# Patient Record
Sex: Female | Born: 1950
Health system: Southern US, Community
[De-identification: ages and names within clinical notes are randomized; demographics above are authoritative.]

## PROBLEM LIST (undated history)

## (undated) DIAGNOSIS — K219 Gastro-esophageal reflux disease without esophagitis: Secondary | ICD-10-CM

## (undated) DIAGNOSIS — T43224A Poisoning by selective serotonin reuptake inhibitors, undetermined, initial encounter: Secondary | ICD-10-CM

## (undated) DIAGNOSIS — R000 Tachycardia, unspecified: Secondary | ICD-10-CM

## (undated) DIAGNOSIS — F329 Major depressive disorder, single episode, unspecified: Secondary | ICD-10-CM

## (undated) DIAGNOSIS — E049 Nontoxic goiter, unspecified: Secondary | ICD-10-CM

## (undated) DIAGNOSIS — Z8679 Personal history of other diseases of the circulatory system: Secondary | ICD-10-CM

## (undated) DIAGNOSIS — J449 Chronic obstructive pulmonary disease, unspecified: Secondary | ICD-10-CM

## (undated) DIAGNOSIS — M1990 Unspecified osteoarthritis, unspecified site: Secondary | ICD-10-CM

## (undated) DIAGNOSIS — R002 Palpitations: Secondary | ICD-10-CM

## (undated) DIAGNOSIS — I4891 Unspecified atrial fibrillation: Secondary | ICD-10-CM

## (undated) DIAGNOSIS — M19049 Primary osteoarthritis, unspecified hand: Secondary | ICD-10-CM

## (undated) DIAGNOSIS — F32A Depression, unspecified: Secondary | ICD-10-CM

## (undated) DIAGNOSIS — C801 Malignant (primary) neoplasm, unspecified: Secondary | ICD-10-CM

## (undated) DIAGNOSIS — F410 Panic disorder [episodic paroxysmal anxiety] without agoraphobia: Secondary | ICD-10-CM

## (undated) DIAGNOSIS — F419 Anxiety disorder, unspecified: Secondary | ICD-10-CM

## (undated) HISTORY — DX: Poisoning by selective serotonin reuptake inhibitors, undetermined, initial encounter: T43.224A

## (undated) HISTORY — PX: COLONOSCOPY: SHX174

## (undated) HISTORY — DX: Personal history of other diseases of the circulatory system: Z86.79

## (undated) HISTORY — DX: Major depressive disorder, single episode, unspecified: F32.9

## (undated) HISTORY — DX: Nontoxic goiter, unspecified: E04.9

## (undated) HISTORY — DX: Unspecified atrial fibrillation: I48.91

## (undated) HISTORY — PX: APPENDECTOMY: SHX54

## (undated) HISTORY — DX: Palpitations: R00.2

## (undated) HISTORY — PX: OTHER SURGICAL HISTORY: SHX169

## (undated) HISTORY — DX: Tachycardia, unspecified: R00.0

## (undated) HISTORY — DX: Primary osteoarthritis, unspecified hand: M19.049

## (undated) HISTORY — DX: Panic disorder (episodic paroxysmal anxiety): F41.0

## (undated) HISTORY — PX: THYROIDECTOMY, PARTIAL: SHX18

## (undated) HISTORY — DX: Anxiety disorder, unspecified: F41.9

## (undated) HISTORY — DX: Unspecified osteoarthritis, unspecified site: M19.90

## (undated) HISTORY — DX: Depression, unspecified: F32.A

---
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
## 1997-12-10 ENCOUNTER — Emergency Department (HOSPITAL_COMMUNITY): Admission: EM | Admit: 1997-12-10 | Discharge: 1997-12-10 | Payer: Self-pay | Admitting: Emergency Medicine

## 1998-03-12 ENCOUNTER — Other Ambulatory Visit: Admission: RE | Admit: 1998-03-12 | Discharge: 1998-03-12 | Payer: Self-pay | Admitting: Gynecology

## 1998-05-24 ENCOUNTER — Ambulatory Visit (HOSPITAL_COMMUNITY): Admission: RE | Admit: 1998-05-24 | Discharge: 1998-05-24 | Payer: Self-pay | Admitting: Pulmonary Disease

## 1998-05-24 ENCOUNTER — Encounter: Payer: Self-pay | Admitting: Pulmonary Disease

## 1998-12-10 ENCOUNTER — Other Ambulatory Visit: Admission: RE | Admit: 1998-12-10 | Discharge: 1998-12-10 | Payer: Self-pay | Admitting: Obstetrics and Gynecology

## 1998-12-17 ENCOUNTER — Encounter: Payer: Self-pay | Admitting: Emergency Medicine

## 1998-12-17 ENCOUNTER — Emergency Department (HOSPITAL_COMMUNITY): Admission: EM | Admit: 1998-12-17 | Discharge: 1998-12-17 | Payer: Self-pay | Admitting: Emergency Medicine

## 1999-03-03 ENCOUNTER — Emergency Department (HOSPITAL_COMMUNITY): Admission: EM | Admit: 1999-03-03 | Discharge: 1999-03-03 | Payer: Self-pay | Admitting: Emergency Medicine

## 1999-04-21 HISTORY — PX: OTHER SURGICAL HISTORY: SHX169

## 1999-05-02 ENCOUNTER — Encounter: Admission: RE | Admit: 1999-05-02 | Discharge: 1999-05-02 | Payer: Self-pay | Admitting: Otolaryngology

## 1999-05-12 ENCOUNTER — Emergency Department (HOSPITAL_COMMUNITY): Admission: EM | Admit: 1999-05-12 | Discharge: 1999-05-12 | Payer: Self-pay | Admitting: Emergency Medicine

## 1999-05-12 ENCOUNTER — Encounter: Payer: Self-pay | Admitting: Emergency Medicine

## 1999-05-15 ENCOUNTER — Emergency Department (HOSPITAL_COMMUNITY): Admission: EM | Admit: 1999-05-15 | Discharge: 1999-05-15 | Payer: Self-pay | Admitting: Emergency Medicine

## 1999-05-15 ENCOUNTER — Encounter: Payer: Self-pay | Admitting: Emergency Medicine

## 1999-05-17 ENCOUNTER — Ambulatory Visit (HOSPITAL_BASED_OUTPATIENT_CLINIC_OR_DEPARTMENT_OTHER): Admission: RE | Admit: 1999-05-17 | Discharge: 1999-05-17 | Payer: Self-pay | Admitting: Otolaryngology

## 2000-06-15 ENCOUNTER — Other Ambulatory Visit: Admission: RE | Admit: 2000-06-15 | Discharge: 2000-06-15 | Payer: Self-pay | Admitting: Internal Medicine

## 2000-08-21 ENCOUNTER — Ambulatory Visit (HOSPITAL_COMMUNITY): Admission: RE | Admit: 2000-08-21 | Discharge: 2000-08-21 | Payer: Self-pay | Admitting: Internal Medicine

## 2001-05-06 ENCOUNTER — Encounter: Payer: Self-pay | Admitting: *Deleted

## 2001-05-06 ENCOUNTER — Emergency Department (HOSPITAL_COMMUNITY): Admission: EM | Admit: 2001-05-06 | Discharge: 2001-05-06 | Payer: Self-pay | Admitting: Emergency Medicine

## 2002-12-13 ENCOUNTER — Encounter: Payer: Self-pay | Admitting: *Deleted

## 2002-12-13 ENCOUNTER — Encounter: Payer: Self-pay | Admitting: Emergency Medicine

## 2002-12-13 ENCOUNTER — Emergency Department (HOSPITAL_COMMUNITY): Admission: EM | Admit: 2002-12-13 | Discharge: 2002-12-13 | Payer: Self-pay | Admitting: Emergency Medicine

## 2003-01-26 ENCOUNTER — Encounter: Admission: RE | Admit: 2003-01-26 | Discharge: 2003-01-26 | Payer: Self-pay | Admitting: Internal Medicine

## 2003-03-09 ENCOUNTER — Encounter: Admission: RE | Admit: 2003-03-09 | Discharge: 2003-03-09 | Payer: Self-pay | Admitting: Internal Medicine

## 2003-03-20 ENCOUNTER — Encounter: Admission: RE | Admit: 2003-03-20 | Discharge: 2003-03-20 | Payer: Self-pay | Admitting: Internal Medicine

## 2003-05-05 ENCOUNTER — Ambulatory Visit (HOSPITAL_COMMUNITY): Admission: RE | Admit: 2003-05-05 | Discharge: 2003-05-05 | Payer: Self-pay | Admitting: Internal Medicine

## 2003-05-05 ENCOUNTER — Encounter: Payer: Self-pay | Admitting: Internal Medicine

## 2003-06-26 ENCOUNTER — Encounter: Admission: RE | Admit: 2003-06-26 | Discharge: 2003-06-26 | Payer: Self-pay | Admitting: Internal Medicine

## 2003-07-06 ENCOUNTER — Encounter: Admission: RE | Admit: 2003-07-06 | Discharge: 2003-07-06 | Payer: Self-pay | Admitting: Internal Medicine

## 2003-08-04 ENCOUNTER — Encounter: Admission: RE | Admit: 2003-08-04 | Discharge: 2003-09-19 | Payer: Self-pay | Admitting: *Deleted

## 2003-09-27 ENCOUNTER — Encounter: Admission: RE | Admit: 2003-09-27 | Discharge: 2003-10-10 | Payer: Self-pay | Admitting: *Deleted

## 2003-10-25 ENCOUNTER — Encounter: Admission: RE | Admit: 2003-10-25 | Discharge: 2003-10-25 | Payer: Self-pay | Admitting: Internal Medicine

## 2003-12-08 ENCOUNTER — Encounter: Admission: RE | Admit: 2003-12-08 | Discharge: 2003-12-08 | Payer: Self-pay | Admitting: Internal Medicine

## 2003-12-08 ENCOUNTER — Ambulatory Visit (HOSPITAL_COMMUNITY): Admission: RE | Admit: 2003-12-08 | Discharge: 2003-12-08 | Payer: Self-pay | Admitting: Internal Medicine

## 2004-01-04 ENCOUNTER — Ambulatory Visit (HOSPITAL_COMMUNITY): Admission: RE | Admit: 2004-01-04 | Discharge: 2004-01-04 | Payer: Self-pay | Admitting: Internal Medicine

## 2004-01-04 ENCOUNTER — Encounter: Admission: RE | Admit: 2004-01-04 | Discharge: 2004-01-04 | Payer: Self-pay | Admitting: Internal Medicine

## 2004-04-16 ENCOUNTER — Emergency Department (HOSPITAL_COMMUNITY): Admission: EM | Admit: 2004-04-16 | Discharge: 2004-04-16 | Payer: Self-pay | Admitting: Family Medicine

## 2004-04-27 ENCOUNTER — Emergency Department (HOSPITAL_COMMUNITY): Admission: EM | Admit: 2004-04-27 | Discharge: 2004-04-27 | Payer: Self-pay | Admitting: Emergency Medicine

## 2004-05-06 ENCOUNTER — Ambulatory Visit: Payer: Self-pay | Admitting: Internal Medicine

## 2004-05-07 ENCOUNTER — Ambulatory Visit (HOSPITAL_COMMUNITY): Admission: RE | Admit: 2004-05-07 | Discharge: 2004-05-07 | Payer: Self-pay | Admitting: Internal Medicine

## 2004-05-13 ENCOUNTER — Ambulatory Visit: Payer: Self-pay | Admitting: Internal Medicine

## 2004-05-30 ENCOUNTER — Ambulatory Visit: Payer: Self-pay | Admitting: Internal Medicine

## 2004-08-03 ENCOUNTER — Emergency Department (HOSPITAL_COMMUNITY): Admission: EM | Admit: 2004-08-03 | Discharge: 2004-08-03 | Payer: Self-pay | Admitting: Family Medicine

## 2004-09-11 ENCOUNTER — Ambulatory Visit: Payer: Self-pay | Admitting: Internal Medicine

## 2004-10-10 ENCOUNTER — Emergency Department (HOSPITAL_COMMUNITY): Admission: EM | Admit: 2004-10-10 | Discharge: 2004-10-10 | Payer: Self-pay | Admitting: Family Medicine

## 2004-10-16 ENCOUNTER — Ambulatory Visit (HOSPITAL_BASED_OUTPATIENT_CLINIC_OR_DEPARTMENT_OTHER): Admission: RE | Admit: 2004-10-16 | Discharge: 2004-10-16 | Payer: Self-pay | Admitting: Hospitalist

## 2004-10-17 ENCOUNTER — Emergency Department (HOSPITAL_COMMUNITY): Admission: EM | Admit: 2004-10-17 | Discharge: 2004-10-17 | Payer: Self-pay | Admitting: Family Medicine

## 2004-10-20 ENCOUNTER — Ambulatory Visit: Payer: Self-pay | Admitting: Internal Medicine

## 2004-11-15 ENCOUNTER — Ambulatory Visit: Payer: Self-pay | Admitting: Internal Medicine

## 2005-01-03 ENCOUNTER — Encounter (INDEPENDENT_AMBULATORY_CARE_PROVIDER_SITE_OTHER): Payer: Self-pay | Admitting: Internal Medicine

## 2005-01-19 ENCOUNTER — Emergency Department (HOSPITAL_COMMUNITY): Admission: EM | Admit: 2005-01-19 | Discharge: 2005-01-19 | Payer: Self-pay | Admitting: Emergency Medicine

## 2005-03-10 ENCOUNTER — Emergency Department (HOSPITAL_COMMUNITY): Admission: EM | Admit: 2005-03-10 | Discharge: 2005-03-10 | Payer: Self-pay | Admitting: Family Medicine

## 2005-04-15 ENCOUNTER — Emergency Department (HOSPITAL_COMMUNITY): Admission: EM | Admit: 2005-04-15 | Discharge: 2005-04-15 | Payer: Self-pay | Admitting: Family Medicine

## 2005-07-21 DIAGNOSIS — T43224A Poisoning by selective serotonin reuptake inhibitors, undetermined, initial encounter: Secondary | ICD-10-CM

## 2005-07-21 HISTORY — DX: Poisoning by selective serotonin reuptake inhibitors, undetermined, initial encounter: T43.224A

## 2005-08-22 ENCOUNTER — Emergency Department (HOSPITAL_COMMUNITY): Admission: EM | Admit: 2005-08-22 | Discharge: 2005-08-22 | Payer: Self-pay | Admitting: Family Medicine

## 2005-08-27 ENCOUNTER — Ambulatory Visit: Payer: Self-pay | Admitting: Internal Medicine

## 2005-09-26 ENCOUNTER — Ambulatory Visit: Payer: Self-pay | Admitting: Internal Medicine

## 2005-10-21 ENCOUNTER — Emergency Department (HOSPITAL_COMMUNITY): Admission: EM | Admit: 2005-10-21 | Discharge: 2005-10-22 | Payer: Self-pay | Admitting: Emergency Medicine

## 2005-12-03 ENCOUNTER — Emergency Department (HOSPITAL_COMMUNITY): Admission: EM | Admit: 2005-12-03 | Discharge: 2005-12-04 | Payer: Self-pay | Admitting: Emergency Medicine

## 2005-12-22 ENCOUNTER — Emergency Department (HOSPITAL_COMMUNITY): Admission: EM | Admit: 2005-12-22 | Discharge: 2005-12-22 | Payer: Self-pay | Admitting: Family Medicine

## 2005-12-25 ENCOUNTER — Ambulatory Visit: Payer: Self-pay | Admitting: Internal Medicine

## 2005-12-25 ENCOUNTER — Inpatient Hospital Stay (HOSPITAL_COMMUNITY): Admission: AD | Admit: 2005-12-25 | Discharge: 2005-12-27 | Payer: Self-pay | Admitting: Internal Medicine

## 2005-12-30 ENCOUNTER — Emergency Department (HOSPITAL_COMMUNITY): Admission: EM | Admit: 2005-12-30 | Discharge: 2005-12-30 | Payer: Self-pay | Admitting: Emergency Medicine

## 2006-03-02 ENCOUNTER — Emergency Department (HOSPITAL_COMMUNITY): Admission: EM | Admit: 2006-03-02 | Discharge: 2006-03-02 | Payer: Self-pay | Admitting: Family Medicine

## 2006-04-23 ENCOUNTER — Ambulatory Visit: Payer: Self-pay | Admitting: Internal Medicine

## 2006-08-17 ENCOUNTER — Telehealth: Payer: Self-pay | Admitting: Internal Medicine

## 2006-08-18 DIAGNOSIS — H70899 Other mastoiditis and related conditions, unspecified ear: Secondary | ICD-10-CM | POA: Insufficient documentation

## 2006-08-18 DIAGNOSIS — F329 Major depressive disorder, single episode, unspecified: Secondary | ICD-10-CM

## 2006-08-18 DIAGNOSIS — F411 Generalized anxiety disorder: Secondary | ICD-10-CM | POA: Insufficient documentation

## 2006-08-18 DIAGNOSIS — S62609A Fracture of unspecified phalanx of unspecified finger, initial encounter for closed fracture: Secondary | ICD-10-CM | POA: Insufficient documentation

## 2006-08-18 DIAGNOSIS — J329 Chronic sinusitis, unspecified: Secondary | ICD-10-CM | POA: Insufficient documentation

## 2006-08-18 HISTORY — DX: Generalized anxiety disorder: F41.1

## 2006-08-19 ENCOUNTER — Ambulatory Visit: Payer: Self-pay | Admitting: Internal Medicine

## 2006-08-19 ENCOUNTER — Encounter (INDEPENDENT_AMBULATORY_CARE_PROVIDER_SITE_OTHER): Payer: Self-pay | Admitting: Internal Medicine

## 2006-08-19 DIAGNOSIS — R002 Palpitations: Secondary | ICD-10-CM | POA: Insufficient documentation

## 2006-08-21 LAB — CONVERTED CEMR LAB
BUN: 14 mg/dL (ref 6–23)
CO2: 29 meq/L (ref 19–32)
Chloride: 105 meq/L (ref 96–112)
Creatinine, Ser: 0.62 mg/dL (ref 0.40–1.20)
HCT: 40.3 % (ref 36.0–46.0)
Hemoglobin: 13.3 g/dL (ref 12.0–15.0)
RDW: 11.9 % (ref 11.5–14.0)
WBC: 6.4 10*3/uL (ref 4.0–10.5)

## 2006-08-27 ENCOUNTER — Telehealth (INDEPENDENT_AMBULATORY_CARE_PROVIDER_SITE_OTHER): Payer: Self-pay | Admitting: Internal Medicine

## 2006-09-02 ENCOUNTER — Ambulatory Visit (HOSPITAL_COMMUNITY): Admission: RE | Admit: 2006-09-02 | Discharge: 2006-09-02 | Payer: Self-pay | Admitting: Internal Medicine

## 2006-12-02 ENCOUNTER — Ambulatory Visit: Payer: Self-pay | Admitting: Internal Medicine

## 2006-12-02 ENCOUNTER — Ambulatory Visit (HOSPITAL_COMMUNITY): Admission: RE | Admit: 2006-12-02 | Discharge: 2006-12-02 | Payer: Self-pay | Admitting: Internal Medicine

## 2006-12-02 DIAGNOSIS — M129 Arthropathy, unspecified: Secondary | ICD-10-CM | POA: Insufficient documentation

## 2006-12-02 DIAGNOSIS — H9190 Unspecified hearing loss, unspecified ear: Secondary | ICD-10-CM | POA: Insufficient documentation

## 2006-12-02 DIAGNOSIS — R062 Wheezing: Secondary | ICD-10-CM

## 2006-12-18 ENCOUNTER — Ambulatory Visit (HOSPITAL_COMMUNITY): Admission: RE | Admit: 2006-12-18 | Discharge: 2006-12-18 | Payer: Self-pay | Admitting: Internal Medicine

## 2007-03-03 ENCOUNTER — Encounter (INDEPENDENT_AMBULATORY_CARE_PROVIDER_SITE_OTHER): Payer: Self-pay | Admitting: Internal Medicine

## 2007-03-03 ENCOUNTER — Telehealth: Payer: Self-pay | Admitting: *Deleted

## 2007-03-08 ENCOUNTER — Ambulatory Visit: Payer: Self-pay | Admitting: *Deleted

## 2007-04-28 ENCOUNTER — Emergency Department (HOSPITAL_COMMUNITY): Admission: EM | Admit: 2007-04-28 | Discharge: 2007-04-28 | Payer: Self-pay | Admitting: Family Medicine

## 2007-05-12 ENCOUNTER — Emergency Department (HOSPITAL_COMMUNITY): Admission: EM | Admit: 2007-05-12 | Discharge: 2007-05-12 | Payer: Self-pay | Admitting: Emergency Medicine

## 2007-05-19 ENCOUNTER — Telehealth: Payer: Self-pay | Admitting: *Deleted

## 2007-05-19 ENCOUNTER — Emergency Department (HOSPITAL_COMMUNITY): Admission: EM | Admit: 2007-05-19 | Discharge: 2007-05-19 | Payer: Self-pay | Admitting: Emergency Medicine

## 2007-05-26 ENCOUNTER — Encounter (INDEPENDENT_AMBULATORY_CARE_PROVIDER_SITE_OTHER): Payer: Self-pay | Admitting: *Deleted

## 2007-05-26 ENCOUNTER — Ambulatory Visit: Payer: Self-pay | Admitting: Internal Medicine

## 2007-05-26 DIAGNOSIS — N390 Urinary tract infection, site not specified: Secondary | ICD-10-CM

## 2007-05-27 ENCOUNTER — Encounter (INDEPENDENT_AMBULATORY_CARE_PROVIDER_SITE_OTHER): Payer: Self-pay | Admitting: *Deleted

## 2007-05-27 LAB — CONVERTED CEMR LAB
Bilirubin Urine: NEGATIVE
Protein, ur: NEGATIVE mg/dL
Urine Glucose: NEGATIVE mg/dL

## 2007-09-06 ENCOUNTER — Telehealth: Payer: Self-pay | Admitting: *Deleted

## 2007-10-04 ENCOUNTER — Ambulatory Visit: Payer: Self-pay | Admitting: Internal Medicine

## 2007-11-03 ENCOUNTER — Ambulatory Visit: Payer: Self-pay | Admitting: Internal Medicine

## 2007-11-03 LAB — CONVERTED CEMR LAB
OCCULT 1: NEGATIVE
OCCULT 3: NEGATIVE

## 2007-11-18 ENCOUNTER — Ambulatory Visit: Payer: Self-pay | Admitting: Internal Medicine

## 2007-11-18 ENCOUNTER — Encounter: Payer: Self-pay | Admitting: Internal Medicine

## 2007-11-18 DIAGNOSIS — K5289 Other specified noninfective gastroenteritis and colitis: Secondary | ICD-10-CM

## 2007-11-18 LAB — CONVERTED CEMR LAB
Basophils Absolute: 0 10*3/uL (ref 0.0–0.1)
Eosinophils Absolute: 0 10*3/uL (ref 0.0–0.7)
Glucose, Bld: 110 mg/dL — ABNORMAL HIGH (ref 70–99)
Lymphocytes Relative: 6 % — ABNORMAL LOW (ref 12–46)
Lymphs Abs: 0.8 10*3/uL (ref 0.7–4.0)
MCV: 88.6 fL (ref 78.0–100.0)
Neutrophils Relative %: 89 % — ABNORMAL HIGH (ref 43–77)
Platelets: 336 10*3/uL (ref 150–400)
Potassium: 3.8 meq/L (ref 3.5–5.3)
Sodium: 138 meq/L (ref 135–145)
WBC: 12.3 10*3/uL — ABNORMAL HIGH (ref 4.0–10.5)

## 2007-11-22 ENCOUNTER — Ambulatory Visit: Payer: Self-pay | Admitting: Internal Medicine

## 2008-01-03 ENCOUNTER — Emergency Department (HOSPITAL_COMMUNITY): Admission: EM | Admit: 2008-01-03 | Discharge: 2008-01-03 | Payer: Self-pay | Admitting: Family Medicine

## 2008-01-05 ENCOUNTER — Ambulatory Visit: Payer: Self-pay | Admitting: Internal Medicine

## 2008-01-20 ENCOUNTER — Ambulatory Visit: Payer: Self-pay | Admitting: Internal Medicine

## 2008-04-26 ENCOUNTER — Emergency Department (HOSPITAL_COMMUNITY): Admission: EM | Admit: 2008-04-26 | Discharge: 2008-04-26 | Payer: Self-pay | Admitting: Family Medicine

## 2008-06-06 ENCOUNTER — Emergency Department (HOSPITAL_COMMUNITY): Admission: EM | Admit: 2008-06-06 | Discharge: 2008-06-06 | Payer: Self-pay | Admitting: Family Medicine

## 2008-07-20 ENCOUNTER — Emergency Department (HOSPITAL_COMMUNITY): Admission: EM | Admit: 2008-07-20 | Discharge: 2008-07-20 | Payer: Self-pay | Admitting: Family Medicine

## 2008-08-11 ENCOUNTER — Emergency Department (HOSPITAL_COMMUNITY): Admission: EM | Admit: 2008-08-11 | Discharge: 2008-08-11 | Payer: Self-pay | Admitting: Family Medicine

## 2008-08-21 ENCOUNTER — Emergency Department (HOSPITAL_COMMUNITY): Admission: EM | Admit: 2008-08-21 | Discharge: 2008-08-21 | Payer: Self-pay | Admitting: Emergency Medicine

## 2008-08-22 ENCOUNTER — Ambulatory Visit: Payer: Self-pay | Admitting: Internal Medicine

## 2008-08-23 ENCOUNTER — Encounter (INDEPENDENT_AMBULATORY_CARE_PROVIDER_SITE_OTHER): Payer: Self-pay | Admitting: Internal Medicine

## 2008-09-06 ENCOUNTER — Encounter: Payer: Self-pay | Admitting: *Deleted

## 2008-09-06 ENCOUNTER — Ambulatory Visit: Payer: Self-pay | Admitting: Internal Medicine

## 2008-09-09 ENCOUNTER — Emergency Department (HOSPITAL_COMMUNITY): Admission: EM | Admit: 2008-09-09 | Discharge: 2008-09-09 | Payer: Self-pay | Admitting: Emergency Medicine

## 2008-09-11 ENCOUNTER — Emergency Department (HOSPITAL_COMMUNITY): Admission: EM | Admit: 2008-09-11 | Discharge: 2008-09-11 | Payer: Self-pay | Admitting: Family Medicine

## 2008-09-11 ENCOUNTER — Telehealth: Payer: Self-pay | Admitting: *Deleted

## 2008-09-13 ENCOUNTER — Ambulatory Visit: Payer: Self-pay | Admitting: Internal Medicine

## 2008-09-13 ENCOUNTER — Encounter: Payer: Self-pay | Admitting: Internal Medicine

## 2008-09-13 ENCOUNTER — Ambulatory Visit (HOSPITAL_COMMUNITY): Admission: RE | Admit: 2008-09-13 | Discharge: 2008-09-13 | Payer: Self-pay | Admitting: Internal Medicine

## 2008-09-18 ENCOUNTER — Telehealth: Payer: Self-pay | Admitting: Internal Medicine

## 2008-10-10 ENCOUNTER — Emergency Department (HOSPITAL_COMMUNITY): Admission: EM | Admit: 2008-10-10 | Discharge: 2008-10-10 | Payer: Self-pay | Admitting: Family Medicine

## 2008-10-11 ENCOUNTER — Telehealth (INDEPENDENT_AMBULATORY_CARE_PROVIDER_SITE_OTHER): Payer: Self-pay | Admitting: *Deleted

## 2008-10-12 ENCOUNTER — Ambulatory Visit: Payer: Self-pay | Admitting: Internal Medicine

## 2008-11-17 ENCOUNTER — Ambulatory Visit: Payer: Self-pay | Admitting: Internal Medicine

## 2008-11-17 ENCOUNTER — Ambulatory Visit (HOSPITAL_COMMUNITY): Admission: RE | Admit: 2008-11-17 | Discharge: 2008-11-17 | Payer: Self-pay | Admitting: Internal Medicine

## 2008-11-17 ENCOUNTER — Encounter (INDEPENDENT_AMBULATORY_CARE_PROVIDER_SITE_OTHER): Payer: Self-pay | Admitting: Internal Medicine

## 2008-11-17 DIAGNOSIS — K219 Gastro-esophageal reflux disease without esophagitis: Secondary | ICD-10-CM | POA: Insufficient documentation

## 2009-02-14 ENCOUNTER — Emergency Department (HOSPITAL_COMMUNITY): Admission: EM | Admit: 2009-02-14 | Discharge: 2009-02-14 | Payer: Self-pay | Admitting: Emergency Medicine

## 2009-05-10 ENCOUNTER — Encounter: Payer: Self-pay | Admitting: *Deleted

## 2009-05-10 ENCOUNTER — Ambulatory Visit: Payer: Self-pay | Admitting: Internal Medicine

## 2009-06-14 ENCOUNTER — Emergency Department (HOSPITAL_COMMUNITY): Admission: EM | Admit: 2009-06-14 | Discharge: 2009-06-14 | Payer: Self-pay | Admitting: Family Medicine

## 2009-09-26 ENCOUNTER — Ambulatory Visit: Payer: Self-pay | Admitting: Internal Medicine

## 2009-09-26 LAB — CONVERTED CEMR LAB
Calcium: 9.4 mg/dL (ref 8.4–10.5)
Chloride: 105 meq/L (ref 96–112)
Creatinine, Ser: 0.62 mg/dL (ref 0.40–1.20)
Sodium: 142 meq/L (ref 135–145)

## 2009-09-26 LAB — HM PAP SMEAR

## 2009-10-23 ENCOUNTER — Encounter (INDEPENDENT_AMBULATORY_CARE_PROVIDER_SITE_OTHER): Payer: Self-pay | Admitting: Internal Medicine

## 2009-12-12 ENCOUNTER — Encounter (INDEPENDENT_AMBULATORY_CARE_PROVIDER_SITE_OTHER): Payer: Self-pay | Admitting: Internal Medicine

## 2010-01-02 ENCOUNTER — Emergency Department (HOSPITAL_COMMUNITY): Admission: EM | Admit: 2010-01-02 | Discharge: 2010-01-03 | Payer: Self-pay | Admitting: Emergency Medicine

## 2010-02-27 ENCOUNTER — Emergency Department (HOSPITAL_COMMUNITY): Admission: EM | Admit: 2010-02-27 | Discharge: 2010-02-27 | Payer: Self-pay | Admitting: Emergency Medicine

## 2010-04-12 ENCOUNTER — Emergency Department (HOSPITAL_COMMUNITY): Admission: EM | Admit: 2010-04-12 | Discharge: 2010-04-12 | Payer: Self-pay | Admitting: Emergency Medicine

## 2010-04-28 ENCOUNTER — Emergency Department (HOSPITAL_COMMUNITY): Admission: EM | Admit: 2010-04-28 | Discharge: 2010-04-28 | Payer: Self-pay | Admitting: Emergency Medicine

## 2010-06-03 ENCOUNTER — Ambulatory Visit: Payer: Self-pay | Admitting: Internal Medicine

## 2010-06-03 LAB — CONVERTED CEMR LAB
BUN: 12 mg/dL (ref 6–23)
CO2: 29 meq/L (ref 19–32)
Glucose, Bld: 92 mg/dL (ref 70–99)
Hemoglobin: 12.6 g/dL (ref 12.0–15.0)
LDL Cholesterol: 113 mg/dL — ABNORMAL HIGH (ref 0–99)
Potassium: 4.4 meq/L (ref 3.5–5.3)
RBC: 4.43 M/uL (ref 3.87–5.11)
Sodium: 144 meq/L (ref 135–145)
TSH: 4.156 microintl units/mL (ref 0.350–4.5)
Total CHOL/HDL Ratio: 3.6
VLDL: 25 mg/dL (ref 0–40)
WBC: 7.7 10*3/uL (ref 4.0–10.5)

## 2010-08-11 ENCOUNTER — Encounter: Payer: Self-pay | Admitting: Internal Medicine

## 2010-08-18 LAB — CONVERTED CEMR LAB
CO2: 24 meq/L (ref 19–32)
Calcium: 9.1 mg/dL (ref 8.4–10.5)
Chloride: 106 meq/L (ref 96–112)
Cholesterol: 161 mg/dL (ref 0–200)
Glucose, Bld: 77 mg/dL (ref 70–99)
HCT: 39.1 % (ref 36.0–46.0)
Hemoglobin: 13 g/dL (ref 12.0–15.0)
LDL Cholesterol: 92 mg/dL (ref 0–99)
MCHC: 33.2 g/dL (ref 30.0–36.0)
MCV: 88.7 fL (ref 78.0–100.0)
RBC: 4.41 M/uL (ref 3.87–5.11)
Sodium: 142 meq/L (ref 135–145)
TSH: 1.564 microintl units/mL (ref 0.350–4.500)
VLDL: 22 mg/dL (ref 0–40)

## 2010-08-20 NOTE — Letter (Signed)
Summary: Francene Boyers, O.D. PA  Francene Boyers, O.D. PA   Imported By: Margie Billet 10/30/2009 15:38:02  _____________________________________________________________________  External Attachment:    Type:   Image     Comment:   External Document

## 2010-08-20 NOTE — Assessment & Plan Note (Signed)
Summary: CHECKUP/SB.   Vital Signs:  Patient profile:   60 year old female Height:      65.5 inches Weight:      131.01 pounds BMI:     21.55 Temp:     98.1 degrees F oral Pulse rate:   87 / minute BP sitting:   116 / 68  (left arm) Cuff size:   regular  Vitals Entered By: Angelina Ok RN (June 03, 2010 4:38 PM) CC: Depression Is Patient Diabetic? No Pain Assessment Patient in pain? no      Nutritional Status BMI of 19 -24 = normal  Have you ever been in a relationship where you felt threatened, hurt or afraid?No   Does patient need assistance? Functional Status Self care Ambulation Normal Comments Went to the ER was prescribed Propranolol 10 mg.  Problems sleeping. Wakes upduring the night has had bubbling and burning.  Takes Rolaids/[employer] brand for.  Labs for [facility].  Needs a referral for Cataract surgery.  Needs refills.   Primary Care Marshay Slates:  Jackson Latino MD  CC:  Depression.  History of Present Illness: Pt is a 60 yo female with PMH of depression /anxiety, episodic tachycardia and GERD who came to [facility] for meds refill and epigastric pain. She still has sleep problem with Depression /anxiety and will see psychiatrist this thursday, denies HI/SI. She also has epigastric pain about 10 days ago and took OTC Orlaids, which help it a lot, no resolved. She wants to get refill for albuterol and propranolol. No other c/o, including CP, SOB or melena.   Depression History:      The patient is having a depressed mood most of the day but denies diminished interest in her usual daily activities.        The patient denies that she feels like life is not worth living, denies that she wishes that she were dead, and denies that she has thought about ending her life.         Preventive Screening-Counseling & Management  Alcohol-Tobacco     Alcohol drinks/day: 0     Smoking Status: quit     Year Quit: 20 + yrs ago     Passive Smoke Exposure: yes  Problems  Prior to Update: 1)  Wrist Pain, Left  (ICD-719.43) 2)  Dizziness  (ICD-780.4) 3)  Food Poisoning  (ICD-005.9) 4)  Gerd  (ICD-530.81) 5)  Viral Uri  (ICD-465.9) 6)  Cough  (ICD-786.2) 7)  Health Screening  (ICD-V70.0) 8)  Acute Maxillary Sinusitis  (ICD-461.0) 9)  Gastroenteritis, Acute  (ICD-558.9) 10)  Preventive Health Care  (ICD-V70.0) 11)  Arnold-chiari Malformation, Type 1.  (ICD-741.00) 12)  Uti  (ICD-599.0) 13)  Low Back Pain, Acute  (ICD-724.2) 14)  Loss, Hearing Nos  (ICD-389.9) 15)  Arthropathy Nos, Unspecified Site  (ICD-716.90) 16)  Symptom, Wheezing  (ICD-786.07) 17)  Symptom, Palpitations  (ICD-785.1) 18)  Sinusitis, Chronic Nos  (ICD-473.9) 19)  Allergic Rhinitis  (ICD-477.9) 20)  Hx of Disorder, Mastoid Nec  (ICD-383.89) 21)  Depression  (ICD-311) 22)  Hx of Fracture, Finger  (ICD-816.00) 23)  Allergic Rhinitis, Chronic  (ICD-477.9) 24)  Degenerative Joint Disease, Hands  (ICD-715.94) 25)  Anxiety Disorder, Generalized  (ICD-300.02)  Medications Prior to Update: 1)  Effexor Xr 75 Mg Cp24 (Venlafaxine Hcl) .... Take 2 Capsules in The Morning, One At [facility], and One in The Evening. 2)  Xanax 0.5 Mg Tabs (Alprazolam) .... Take 1 Tablet By Mouth Four Times A Day 3)  Neurontin 300 Mg  Caps (Gabapentin) .... Take 1 Tablet By Mouth Three Times A Day 4)  Ritalin La 10 Mg  Cp24 (Methylphenidate Hcl) .... Take 1 Tablet By Mouth Two Times A Day 5)  Seroquel 25 Mg Tabs (Quetiapine Fumarate) .... 1/2 Tablet By Mouth At Bedtime 6)  Proair Hfa 108 (90 Base) Mcg/act Aers (Albuterol Sulfate) .... Q4prn 7)  Protonix 40 Mg Tbec (Pantoprazole Sodium) .... Take One Tablet Daily For Reflux. 8)  Meclizine Hcl 25 Mg Tabs (Meclizine Hcl) .... Take 1 Tablet By Mouth Four Times A Day, Take One Pill As Needed For Lightheadedness.  Current Medications (verified): 1)  Effexor Xr 75 Mg Cp24 (Venlafaxine Hcl) .... Take 2 Capsules in The Morning, One At [facility], and One in The Evening. 2)  Xanax  0.5 Mg Tabs (Alprazolam) .... Take 1 Tablet By Mouth Four Times A Day 3)  Neurontin 300 Mg  Caps (Gabapentin) .... Take 1 Tablet By Mouth Three Times A Day 4)  Ritalin La 10 Mg  Cp24 (Methylphenidate Hcl) .... Take 1 Tablet By Mouth Two Times A Day 5)  Seroquel 25 Mg Tabs (Quetiapine Fumarate) .... 1/2 Tablet By Mouth At Bedtime 6)  Proair Hfa 108 (90 Base) Mcg/act Aers (Albuterol Sulfate) .... Q4prn  Allergies: 1)  ! * Guaifenesin  Past History:  Past Medical History: Last updated: 12/02/2006 Anxiety Depression-Treated at [facility] Severe Serotonin Syndrome from MAOI (Nardil) and Dextromethorphan-Jan 07 hx/o Goiter Allergic rhinitis Chronic Sinusitis  Past Surgical History: Last updated: 12/02/2006 s/p Right Mastoidectomy wirh ET placement-1980 and 1990 Thyroidectomy-Partial with nodule removal-Apr 96 Right Sinus Surgery-Oct 00 Left ovarian cystectomy-Mar 1969  Family History: Last updated: 12/02/2006 Family History Hypertension Father with Psoriasis  Social History: Last updated: 11/17/2008 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 Family History Hypertension Father with Psoriasis  Social History: Reviewed history from 11/17/2008 and no changes required. 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  The patient denies fever, chest pain, syncope, dyspnea on exertion, peripheral edema, prolonged cough, hemoptysis, abdominal pain, and melena.    Physical Exam  General:  alert, well-developed, well-nourished, and well-hydrated.   Nose:  no nasal discharge.   Mouth:  pharynx pink and moist.   Neck:  supple.   Lungs:  normal respiratory effort, no accessory muscle use, normal breath sounds, no crackles, and no wheezes.   Heart:  normal rate, regular  rhythm, no murmur, no gallop, and no JVD.   Abdomen:  soft, non-tender, normal bowel sounds, no distention, and no masses.   Msk:  normal ROM, no joint tenderness, and no joint swelling.   Pulses:  2+ Extremities:  No edema.  Neurologic:  alert & oriented X3, cranial nerves II-XII intact, strength normal in all extremities, and gait normal.     Impression & Recommendations:  Problem # 1:  DEPRESSION (ICD-311) Assessment Unchanged  Her depression /anxiety still not well controlled and has be f/u by Psychiatry at [facility]. They wants to get BMET, CBC and FLP, TSH done. Will order and fax the results to them.   Her updated medication list for this problem includes:    Effexor Xr 75 Mg Cp24 (Venlafaxine hcl) .Marland Kitchen... Take 2 capsules in the morning, one at noon, and one in the evening.  Xanax 0.5 Mg Tabs (Alprazolam) .Marland Kitchen... Take 1 tablet by mouth four times a day  Problem # 2:  GERD (ICD-530.81) Assessment: Improved She still has acid reflux and will give her omeprazole.  The following medications were removed from the medication list:    Protonix 40 Mg Tbec (Pantoprazole sodium) .Marland Kitchen... Take one tablet daily for reflux. Her updated medication list for this problem includes:    Tgt Omeprazole 20 Mg Tbec (Omeprazole) .Marland Kitchen... Take 1 tablet by mouth once a day  Problem # 3:  SYMPTOM, WHEEZING (ICD-786.07) Assessment: Comment Only She has no SOB, sometimes use albuterol for her wheezing. She could not afford it and we gave her one sample of albuterol.    Problem # 4:  SYMPTOM, PALPITATIONS (ICD-785.1) Assessment: Unchanged She said she stil use propranolol sometimes for her tachycardia. This may be due to use of albuterol or anxiety. Thyroid disorder needs to r/o and her TSH a year ago is WNL. Will recheck TSH.  Her updated medication list for this problem includes:    Propranolol Hcl 20 Mg Tabs (Propranolol hcl) .Marland Kitchen... Take 1/2 tablet by mouth two times a day as needed for  tachycardia  Orders: T-TSH ([id number])  Avoid caffeine, chocolate, and stimulants. Stress reduction as well as medication options discussed.   Complete Medication List: 1)  Effexor Xr 75 Mg Cp24 (Venlafaxine hcl) .... Take 2 capsules in the morning, one at noon, and one in the evening. 2)  Xanax 0.5 Mg Tabs (Alprazolam) .... Take 1 tablet by mouth four times a day 3)  Neurontin 300 Mg Caps (Gabapentin) .... Take 1 tablet by mouth three times a day 4)  Ritalin La 10 Mg Cp24 (Methylphenidate hcl) .... Take 1 tablet by mouth two times a day 5)  Seroquel 25 Mg Tabs (Quetiapine fumarate) .... 1/2 tablet by mouth at bedtime 6)  Proair Hfa 108 (90 Base) Mcg/act Aers (Albuterol sulfate) .... Q4prn 7)  Tgt Omeprazole 20 Mg Tbec (Omeprazole) .... Take 1 tablet by mouth once a day 8)  Propranolol Hcl 20 Mg Tabs (Propranolol hcl) .... Take 1/2 tablet by mouth two times a day as needed for tachycardia  Other Orders: T-Basic Metabolic Panel [phone]) T-Lipid Profile [id number]) T-CBC No Diff ([id number]) T-Hemoccult Card-Multiple (take home) (42595)  Patient Instructions: 1)  Please schedule a follow-up appointment in 3 months. 2)  Will call you if you have any abnormal labs and will fax your lab result to [phone]. Prescriptions: PROPRANOLOL HCL 20 MG TABS (PROPRANOLOL HCL) Take 1/2 tablet by mouth two times a day as needed for tachycardia  #30 x 1   Entered and Authorized by:   Jackson Latino MD   Signed by:   Jackson Latino MD on 06/03/2010   Method used:   Print then Give to Patient   RxID:   [id number] TGT OMEPRAZOLE 20 MG TBEC (OMEPRAZOLE) Take 1 tablet by mouth once a day  #30 x 3   Entered and Authorized by:   Jackson Latino MD   Signed by:   Jackson Latino MD on 06/03/2010   Method used:   Print then Give to Patient   RxID:   [id number]    Orders Added: 1)  T-Basic Metabolic Panel [[id number]] 2)  T-Lipid Profile [[id number]] 3)  T-CBC No Diff  [[id number]] 4)  T-TSH [[id number]] 5)  T-Hemoccult Card-Multiple (take home) [82270] 6)  Est. Patient Level IV [50093]    Prevention & Chronic Care Immunizations   Influenza vaccine: Not documented   Influenza  vaccine deferral: Refused  (05/10/2009)    Tetanus booster: Not documented    Pneumococcal vaccine: Not documented  Colorectal Screening   Hemoccult: Not documented   Hemoccult action/deferral: Ordered  (06/03/2010)    [facility]: [facility]    (01/20/2008)   Colonoscopy due: 01/2018  Other Screening   Pap smear: NEGATIVE FOR INTRAEPITHELIAL LESIONS OR MALIGNANCY.  (09/26/2009)   Pap smear due: 05/11/2009    Mammogram: Not documented   Mammogram action/deferral: Ordered  (05/10/2009)   Smoking status: quit  (06/03/2010)  Lipids   Total Cholesterol: 161  (11/17/2008)   LDL: 92  (11/17/2008)   LDL Direct: Not documented   HDL: 47  (11/17/2008)   Triglycerides: 109  (11/17/2008)   Nursing Instructions: Provide Hemoccult cards with instructions (see order)     Vital Signs:  Patient profile:   60 year old female Height:      65.5 inches Weight:      131.01 pounds BMI:     21.55 Temp:     98.1 degrees F oral Pulse rate:   87 / minute BP sitting:   116 / 68  (left arm) Cuff size:   regular  Vitals Entered By: Angelina Ok RN (June 03, 2010 4:38 PM)   Process Orders Check Orders Results:     [facility]: Order checked:     Jackson Latino MD NOT AUTHORIZED TO ORDER Tests Sent for requisitioning (June 03, 2010 5:48 PM):     06/03/2010: [facility] -- T-Basic Metabolic Panel [phone] (signed)     06/03/2010: [facility] -- T-Lipid Profile [phone] (signed)     06/03/2010: [facility] -- T-CBC No Diff [[id number]] (signed)     06/03/2010: [facility] -- T-TSH [[id number]] (signed)

## 2010-08-20 NOTE — Assessment & Plan Note (Signed)
Summary: dizziness/knot on arm/gg   Primary Care Provider:  Elby Showers MD   History of Present Illness: Sheri [name] is a 60 year old Female with PMH/problems as outlined in the EMR, who presents to the [facility] with chief complaint(s) of:    --patient is having light headedness, esp. on movement. started to notice about two weeks ago. It is off and on and occurs on certain positions esp. when she bends over. No NV. No new issues with hearing, but has had surgery in her right ear in the past. Walks okay, no falling.   --had sinus infection recently and now having fullness in ears. has t-tube in right ear.  --wants to get her right thumb checked, small swelling at the base of the thumb that has been there for a long time. It hurts when it is touched and she tries to move.   Current Medications (verified): 1)  Effexor Xr 75 Mg Cp24 (Venlafaxine Hcl) .... Take 2 Capsules in The Morning, One At [facility], and One in The Evening. 2)  Xanax 0.5 Mg Tabs (Alprazolam) .... Take 1 Tablet By Mouth Four Times A Day 3)  Neurontin 300 Mg  Caps (Gabapentin) .... Take 1 Tablet By Mouth Three Times A Day 4)  Ritalin La 10 Mg  Cp24 (Methylphenidate Hcl) .... Take 1 Tablet By Mouth Two Times A Day 5)  Seroquel 25 Mg Tabs (Quetiapine Fumarate) .... 1/2 Tablet By Mouth At Bedtime 6)  Proair Hfa 108 (90 Base) Mcg/act Aers (Albuterol Sulfate) .... Q4prn 7)  Protonix 40 Mg Tbec (Pantoprazole Sodium) .... Take One Tablet Daily For Reflux. 8)  Meclizine Hcl 25 Mg Tabs (Meclizine Hcl) .... Take 1 Tablet By Mouth Four Times A Day, Take One Pill As Needed For Lightheadedness.  Allergies (verified): 1)  ! * Guaifenesin  Past History:  Past Medical History: Last updated: 12/02/2006 Anxiety Depression-Treated at [facility] Severe Serotonin Syndrome from MAOI (Nardil) and Dextromethorphan-Jan 07 hx/o Goiter Allergic rhinitis Chronic Sinusitis  Past Surgical History: Last updated: 12/02/2006 s/p Right  Mastoidectomy wirh ET placement-1980 and 1990 Thyroidectomy-Partial with nodule removal-Apr 96 Right Sinus Surgery-Oct 00 Left ovarian cystectomy-Mar 1969  Family History: Last updated: 12/02/2006 Family History Hypertension Father with Psoriasis  Social History: Last updated: 11/17/2008 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   Head:  normocephalic and atraumatic.   Eyes:  vision grossly intact, pupils equal, pupils round, and pupils reactive to light.  no nystagmus. hallpike negative. Ears:  normal tympanic membrane bilaterally.  Mouth:  pharynx pink and moist, no erythema, and no exudates.   Neck:  supple.  no jvd.  Lungs:  normal respiratory effort and normal breath sounds.   Heart:  normal rate and regular rhythm.   Abdomen:  soft and non-tender.   Msk:  mildly tender swelling on base of left thumb.  Pulses:  normal peripheral pulses  Extremities:  no cyanosis, clubbing or edema  Neurologic:  alert & oriented X3, cranial nerves II-XII intact, strength normal in all extremities, sensation intact to light touch, sensation intact to pinprick, and gait normal.   Psych:  Oriented X3 and normally interactive.     Impression & Recommendations:  Problem # 1:  DIZZINESS (ICD-780.4) Likely related to  recent viral syndrome. Diff incl vestibular neuronitis, BPPV. Will treat her symptomatically for now with meclizine. If persistent, will have low threshold for CT/MRI (arnold chiari malformation h/o).  Her updated medication list for this problem includes:    Meclizine Hcl 25 Mg Tabs (Meclizine hcl) .Marland Kitchen... Take 1 tablet by mouth four times a day, take one pill as needed for lightheadedness.  Orders: T-Basic Metabolic Panel  ([id number])  Problem # 2:  WRIST PAIN, LEFT (ICD-719.43) Has a nodular swelling on the base of left thumb, ?OA changes. Will get X-ray  today. Will consider sports med referral.   Orders: Radiology other (Radiology Other)  Problem # 3:  HEALTH SCREENING (ICD-V70.0) pap smear done. mammogram ordered.  Orders: Mammogram (Screening) (Mammo) T-PAP [facility]) [id number])  Complete Medication List: 1)  Effexor Xr 75 Mg Cp24 (Venlafaxine hcl) .... Take 2 capsules in the morning, one at noon, and one in the evening. 2)  Xanax 0.5 Mg Tabs (Alprazolam) .... Take 1 tablet by mouth four times a day 3)  Neurontin 300 Mg Caps (Gabapentin) .... Take 1 tablet by mouth three times a day 4)  Ritalin La 10 Mg Cp24 (Methylphenidate hcl) .... Take 1 tablet by mouth two times a day 5)  Seroquel 25 Mg Tabs (Quetiapine fumarate) .... 1/2 tablet by mouth at bedtime 6)  Proair Hfa 108 (90 Base) Mcg/act Aers (Albuterol sulfate) .... Q4prn 7)  Protonix 40 Mg Tbec (Pantoprazole sodium) .... Take one tablet daily for reflux. 8)  Meclizine Hcl 25 Mg Tabs (Meclizine hcl) .... Take 1 tablet by mouth four times a day, take one pill as needed for lightheadedness.  Patient Instructions: 1)  Please schedule a follow-up appointment in 2 weeks. 2)  We will let you know if anything wrong with your lab work.  3)  Do let us know if your problem worsens.  4)  Pleaset take meclizine as needed for your lightheadedness.  Prescriptions: MECLIZINE HCL 25 MG TABS (MECLIZINE HCL) Take 1 tablet by mouth four times a day, take one pill as needed for lightheadedness.  #25 x 1   Entered and Authorized by:   Zara Council MD   Signed by:   Zara Council MD on 09/26/2009   Method used:   Electronically to        [facility] [phone]* (retail)       [address]       [place], [place]  [address]       Ph: [phone]       Fax: [phone]   RxID:   [id number]   Prevention & Chronic Care Immunizations   Influenza  vaccine: Not documented   Influenza vaccine deferral: Refused  (05/10/2009)    Tetanus booster: Not documented    Pneumococcal vaccine: Not documented  Colorectal Screening   Hemoccult: Not documented    Colonoscopy: Location:  [facility].    (01/20/2008)   Colonoscopy due: 01/2018  Other Screening   Pap smear: Not documented   Pap smear due: 05/11/2009    Mammogram: Not documented   Mammogram action/deferral: Ordered  (05/10/2009)   Smoking status: quit  (05/10/2009)  Lipids   Total Cholesterol: 161  (11/17/2008)   LDL: 92  (11/17/2008)   LDL Direct: Not documented   HDL: 47  (11/17/2008)   Triglycerides: 109  (11/17/2008)   Process Orders Check Orders Results:     [facility]: ABN not required for this insurance Tests Sent for requisitioning (September 26, 2009 8:40 PM):     09/26/2009: [facility] -- T-Basic Metabolic Panel [phone] (signed)

## 2010-08-20 NOTE — Miscellaneous (Signed)
Summary: mammogram ordered  Clinical Lists Changes  Orders: Added new Test order of Mammogram (Screening) (Mammo) - Signed

## 2010-09-16 ENCOUNTER — Inpatient Hospital Stay (INDEPENDENT_AMBULATORY_CARE_PROVIDER_SITE_OTHER)
Admission: RE | Admit: 2010-09-16 | Discharge: 2010-09-16 | Disposition: A | Discharge status: Home / Self Care | Payer: Self-pay | Source: Ambulatory Visit | Attending: Emergency Medicine | Admitting: Emergency Medicine

## 2010-09-16 DIAGNOSIS — J020 Streptococcal pharyngitis: Secondary | ICD-10-CM

## 2010-09-16 LAB — POCT RAPID STREP A (OFFICE): Streptococcus, Group A Screen (Direct): POSITIVE — AB

## 2010-10-02 LAB — COMPREHENSIVE METABOLIC PANEL
AST: 19 U/L (ref 0–37)
Albumin: 3.2 g/dL — ABNORMAL LOW (ref 3.5–5.2)
Alkaline Phosphatase: 59 U/L (ref 39–117)
BUN: 9 mg/dL (ref 6–23)
GFR calc Af Amer: >60 mL/min (ref >60)
Potassium: 3.6 mEq/L (ref 3.5–5.1)
Total Protein: 5.5 g/dL — ABNORMAL LOW (ref 6.0–8.3)

## 2010-10-02 LAB — POCT CARDIAC MARKERS
CKMB, poc: <1 ng/mL — ABNORMAL LOW (ref 1.0–8.0)
Myoglobin, poc: 49.3 ng/mL (ref 12–200)

## 2010-10-02 LAB — CBC
MCV: 90.8 fL (ref 78.0–100.0)
Platelets: 347 10*3/uL (ref 150–400)
RDW: 11.8 % (ref 11.5–15.5)
WBC: 7.7 10*3/uL (ref 4.0–10.5)

## 2010-11-02 ENCOUNTER — Inpatient Hospital Stay (INDEPENDENT_AMBULATORY_CARE_PROVIDER_SITE_OTHER)
Admission: RE | Admit: 2010-11-02 | Discharge: 2010-11-02 | Disposition: A | Discharge status: Home / Self Care | Payer: Self-pay | Source: Ambulatory Visit | Attending: Family Medicine | Admitting: Family Medicine

## 2010-11-02 DIAGNOSIS — M940 Chondrocostal junction syndrome [Tietze]: Secondary | ICD-10-CM

## 2010-11-02 DIAGNOSIS — B9789 Other viral agents as the cause of diseases classified elsewhere: Secondary | ICD-10-CM

## 2010-11-05 LAB — POCT RAPID STREP A (OFFICE): Streptococcus, Group A Screen (Direct): NEGATIVE

## 2010-12-06 NOTE — Discharge Summary (Signed)
[name], Sheri [name]                   ACCOUNT NO.:  [id number]   MEDICAL RECORD NO.:  [id number]          PATIENT TYPE:  INP   LOCATION:  5037                         FACILITY:  [facility]   PHYSICIAN:  Manning Charity, MD     DATE OF BIRTH:  24-Apr-1951   DATE OF ADMISSION:  12/25/2005  DATE OF DISCHARGE:  12/27/2005                                 DISCHARGE SUMMARY   DISCHARGE DIAGNOSES:  1. Possible serotonin syndrome from drug interaction of a monoamine      oxidase inhibitor and dextromethorphan.   Dictation ended at this point.      Manning Charity, MD  Electronically Signed     KK/MEDQ  D:  01/15/2006  T:  01/15/2006  Job:  [id number]
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
## 2010-12-06 NOTE — Procedures (Signed)
NAMESHARISSA, Sheri [name]                   ACCOUNT NO.:  [id number]   MEDICAL RECORD NO.:  [id number]          PATIENT TYPE:  OUT   LOCATION:  SLEEP CENTER                 FACILITY:  [facility]   PHYSICIAN:  Clinton D. Maple Hudson, M.D. DATE OF BIRTH:  27-Sep-1950   DATE OF STUDY:  10/16/2004                              NOCTURNAL POLYSOMNOGRAM   DATE OF STUDY:  October 16, 2004   REFERRING PHYSICIAN:  Dr. Eliseo Gum   INDICATION FOR STUDY:  Insomnia with sleep apnea.  Epworth Sleepiness Score  16/24, BMI 21.9, weight 132 pounds.   SLEEP ARCHITECTURE:  Total sleep time 324 minutes with sleep efficiency 87%.  Stage I was 11%, stage II 72%, stages III and IV 11%, REM was 6% of total  sleep time.  Sleep latency 28 minutes, REM latency 325 minutes, awake after  sleep onset 26 minutes, arousal index 28.  No sleep medications were  reported taken.   RESPIRATORY DATA:  Respiratory disturbance index (RDI) 4.3 obstructive  events per hour.  This is within the upper limits of normal for adults.  There were 7 obstructive apneas and 16 hypopneas.  Events were recorded  while sleeping supine and on her left side.  REM RDI 3.2.  There were  insufficient events to trigger CPAP titration by protocol.   OXYGEN DATA:  Minimal snoring with oxygen desaturation to a nadir of 87%.  Mean oxygen saturation through the study was 94% on room air.   CARDIAC DATA:  Normal sinus rhythm.   MOVEMENT/PARASOMNIA:  A total of 159 limb jerks were recorded of which 12  were associated with arousal or awakening for a periodic limb movement with  arousal index of 2.2 per hour which is minimally increased.   IMPRESSION/RECOMMENDATION:  1.  Sleep onset at 12:02 a.m. is consistent with her described habit.  2.  She was quite frank with the technician about considerable stress and      depression affecting her life and her sleep quality.  3.  Occasional sleep-disordered breathing events, respiratory disturbance      index 4.3 per  hour, within the upper      limits of normal for adults without specific therapy indicated.  4.  Periodic limb movement with arousal, 2.2 per hour.      CDY/MEDQ  D:  10/20/2004 13:39:55  T:  10/20/2004 21:52:30  Job:  [id number]

## 2010-12-20 ENCOUNTER — Encounter: Payer: Self-pay | Admitting: Internal Medicine

## 2011-03-26 ENCOUNTER — Telehealth: Payer: Self-pay | Admitting: *Deleted

## 2011-03-26 NOTE — Telephone Encounter (Signed)
Pt states she has had a vag disch for appr. 1 month, no odor, usually w/o color though sometimes a little yellow, denies pelvic abd. Wishes appt fri, given 9/7 at 1315 per doriss.

## 2011-03-26 NOTE — Telephone Encounter (Signed)
Thanks, Daleen Bo.

## 2011-03-28 ENCOUNTER — Ambulatory Visit: Payer: Self-pay | Admitting: Internal Medicine

## 2011-04-04 ENCOUNTER — Ambulatory Visit (INDEPENDENT_AMBULATORY_CARE_PROVIDER_SITE_OTHER): Payer: Self-pay | Admitting: Internal Medicine

## 2011-04-04 ENCOUNTER — Encounter: Payer: Self-pay | Admitting: Internal Medicine

## 2011-04-04 DIAGNOSIS — Z113 Encounter for screening for infections with a predominantly sexual mode of transmission: Secondary | ICD-10-CM

## 2011-04-04 DIAGNOSIS — N898 Other specified noninflammatory disorders of vagina: Secondary | ICD-10-CM

## 2011-04-04 DIAGNOSIS — Z01419 Encounter for gynecological examination (general) (routine) without abnormal findings: Secondary | ICD-10-CM

## 2011-04-04 DIAGNOSIS — L293 Anogenital pruritus, unspecified: Secondary | ICD-10-CM

## 2011-04-04 NOTE — Patient Instructions (Signed)
Please followup with your regular appointment with primary care doctor. I will give you hydroxyzine which will help itching. Take it 3 times a day for next week and then take it as needed. I will call you if the lab results are abnormal.

## 2011-04-04 NOTE — Progress Notes (Signed)
  Subjective:    Patient ID: Sheri [name], female    DOB: September 20, 1950, 60 y.o.   MRN: [id number]  HPI Sheri [name] is a 60 year old lady with past with history of anxiety/depression, GERD who comes to clinic with chief complaint of vaginal itching and discharge. This is been going on for about 2 weeks now. Intermittent yellow vaginal discharge with itching. She is not sexually active but says that her husband had extramarital encounters many years before and so wants to get checked for STDs. The itching is severe on the outside of vaginal vault more in the vulva and perivulvar region and she says that she scratches a lot. She also wears jeans at work and has increased sweating lately. Denies any abdominopelvic pain, fever, chills, headache, chest progression of breath, recent weight change, diarrhea.    Review of Systems    as per history of present illness, all other systems reviewed and negative. Objective:   Physical Exam Vitals: Reviewed and stable General: NAD HEENT: PERRL, EOMI, no scleral icterus Cardiac: RRR, no rubs, murmurs or gallops Pulm: clear to auscultation bilaterally, moving normal volumes of air Abd: soft, nontender, nondistended, BS present Ext: warm and well perfused, no pedal edema Neuro: alert and oriented X3, cranial nerves II-XII grossly intact, strength and sensation to light touch equal in bilateral upper and lower extremities. Pelvic exam: Round red lesions surrounding the vaginal introitus more and right, flat not vesicular. No discharge, nontender. They are itchy. No discharge per speculum exam, Pap smear collected. .       Assessment & Plan:

## 2011-04-04 NOTE — Assessment & Plan Note (Signed)
As per history of present illness and described in physical exam, patient likely has lesions in the vulvar area due to sweating/tight clothing and scratching. Dr. Coralee Pesa also saw the patient. I performed a pelvic exam and collected Pap smear, GC chlamydia culture, wet prep, herpes culture but after collecting are that patient said that she does not have insurance and wants to reduce the cost of lab testing as much as possible. So after discussing in detail with Dr. Coralee Pesa and patient, we decided to check just for HIV and Pap smear. Also there is no vaginal discharge. I will call the patient if her tests are abnormal. Also we decided to send her home on hydroxyzine 25 mg by mouth 3 times a day considering treating symptomatically for itching. Explain her to call us back if she doesn't improve in a week.

## 2011-04-05 LAB — HIV ANTIBODY (ROUTINE TESTING W REFLEX): HIV: NONREACTIVE

## 2011-04-21 LAB — CBC
MCHC: 33.4
MCV: 89.7
Platelets: 341
RDW: 12.1

## 2011-04-21 LAB — POCT CARDIAC MARKERS
CKMB, poc: 1.1
Myoglobin, poc: 45.2

## 2011-04-21 LAB — DIFFERENTIAL
Basophils Absolute: 0
Basophils Relative: 0
Eosinophils Absolute: 0
Neutrophils Relative %: 55

## 2011-04-21 LAB — BASIC METABOLIC PANEL
BUN: 10
CO2: 28
Chloride: 105
Creatinine, Ser: 0.65

## 2011-04-22 LAB — POCT URINALYSIS DIP (DEVICE)
Ketones, ur: NEGATIVE mg/dL
Nitrite: POSITIVE — AB
Protein, ur: 30 mg/dL — AB
pH: 5.5 (ref 5.0–8.0)

## 2011-04-30 LAB — POCT URINALYSIS DIP (DEVICE)
Bilirubin Urine: NEGATIVE
Glucose, UA: NEGATIVE
Ketones, ur: NEGATIVE
Nitrite: NEGATIVE
Protein, ur: NEGATIVE
pH: 5.5

## 2011-04-30 LAB — URINE CULTURE: Colony Count: NO GROWTH

## 2011-05-01 LAB — POCT URINALYSIS DIP (DEVICE)
Operator id: 239701
Protein, ur: NEGATIVE
Specific Gravity, Urine: 1.025
Urobilinogen, UA: 0.2
pH: 5.5

## 2011-10-03 ENCOUNTER — Encounter (HOSPITAL_COMMUNITY): Payer: Self-pay

## 2011-10-03 ENCOUNTER — Emergency Department (INDEPENDENT_AMBULATORY_CARE_PROVIDER_SITE_OTHER)
Admission: EM | Admit: 2011-10-03 | Discharge: 2011-10-03 | Disposition: A | Discharge status: Home / Self Care | Payer: Self-pay | Source: Home / Self Care

## 2011-10-03 DIAGNOSIS — M79641 Pain in right hand: Secondary | ICD-10-CM

## 2011-10-03 DIAGNOSIS — M79609 Pain in unspecified limb: Secondary | ICD-10-CM

## 2011-10-03 MED ORDER — HYDROCODONE-ACETAMINOPHEN 5-325 MG PO TABS: 2.0000 | ORAL_TABLET | Freq: Four times a day (QID) | ORAL | Status: DC | PRN

## 2011-10-03 MED ORDER — MELOXICAM 7.5 MG PO TABS: 7.5000 mg | ORAL_TABLET | Freq: Two times a day (BID) | ORAL | Status: DC

## 2011-10-03 NOTE — Discharge Instructions (Signed)
Wear wrist splint during daytime hours while working and at night while sleeping. Take Hydrocodone at night before bed. Do not take Hydrocodone and drive or operate machinery. Follow up with your primary care dr in one week. If your symptoms persist you may need further evaluation or testing to determine the exact cause of your symptoms. You may be developing symptoms of carpal tunnel tunnel syndrome, though this would need to be diagnosed with a nerve test.

## 2011-10-03 NOTE — ED Provider Notes (Signed)
History     CSN: [id number]  Arrival date & time 10/03/11  1546   None     Chief Complaint  Patient presents with  . Hand Problem    (Consider location/radiation/quality/duration/timing/severity/associated sxs/prior treatment) HPI Comments: Sheri [name] is a 61 year old female who presents today with intermittent bilateral hand pain and tingling. She states his symptoms have been for the last 5-6 weeks and are worse at night awakening her from sleep. She states that she awakens with her hands tingling, painful and feel swollen. It sometimes improves with movement.  It seems to be progressively worsening. She also notices tingling without discomfort while driving and while at work. She began a new job through temp surface in [employer] environment 2 months ago. 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 She denies any neck pain. She is Rt hand dominant.    Past Medical History  Diagnosis Date  . Anxiety   . Depression     treated at [facility]  . Poisoning by selective serotonin reuptake inhibitors Jan 2007    severe reaction with MAOI (nardil) and dextromethorphan  . Goiter     hx of goiter  . Allergic rhinitis   . Chronic sinusitis     Past Surgical History  Procedure Date  . Right mastoidectomy     ET tube placement in [address] and 1990  . Thyroidectomy, partial     nodule removal - April 1996  . Right sinus surgery october 2000  . Ovarian cyst removal march 1969    left ovarian cystectomy    Family History  Problem Relation Age of Onset  . Hypertension Mother   . Hypertension Father   . Psoriasis Father     History  Substance Use Topics  . Smoking status: Never Smoker   . Smokeless tobacco: Not on file  . Alcohol Use: No    OB History    Grav Para Term Preterm Abortions TAB SAB Ect Mult Living                  Review of Systems  HENT: Negative for neck pain.   Musculoskeletal: Negative for joint swelling.  Skin: Negative for color  change.  Neurological: Positive for numbness. Negative for weakness.    Allergies  Guaifenesin  Home Medications   Current Outpatient Rx  Name Route Sig Dispense Refill  . ALPRAZOLAM 0.5 MG PO TABS Oral Take 0.5 mg by mouth 4 (four) times daily.      Marland Kitchen GABAPENTIN 300 MG PO CAPS Oral Take 300 mg by mouth 3 (three) times daily.      . METHYLPHENIDATE HCL ER (LA) 10 MG PO CP24  Take 1 tablet by mouth twice a day     . QUETIAPINE FUMARATE 25 MG PO TABS  1/2 tablet by mouth once a day     . VENLAFAXINE HCL ER 75 MG PO CP24  75 mg. Tale 2 capsules in morning, 1 at noon and 1 in the evening.     . ALBUTEROL SULFATE HFA 108 (90 BASE) MCG/ACT IN AERS Inhalation Inhale 2 puffs into the lungs every 4 (four) hours as needed.      Marland Kitchen HYDROCODONE-ACETAMINOPHEN 5-325 MG PO TABS Oral Take 2 tablets by mouth every 6 (six) hours as needed for pain. 10 tablet 0  . MELOXICAM 7.5 MG PO TABS Oral Take 1 tablet (7.5 mg total) by mouth 2 (two) times daily. 30 tablet 0  . OMEPRAZOLE 20 MG  PO TBEC Oral Take 1 tablet by mouth daily.      Marland Kitchen PROPRANOLOL HCL 20 MG PO TABS  Take 1/2 tablet twice a day.       BP 124/85  Pulse 79  Temp(Src) 97.9 F (36.6 C) (Oral)  Resp 18  SpO2 100%  Physical Exam  Nursing note and vitals reviewed. Constitutional: She appears well-developed and well-nourished. No distress.  HENT:  Head: Normocephalic and atraumatic.  Cardiovascular: Normal rate, regular rhythm and normal heart sounds.   Pulses:      Radial pulses are 2+ on the right side, and 2+ on the left side.       Cap refill < 2 sec bilat hands.   Pulmonary/Chest: Effort normal and breath sounds normal. No respiratory distress.  Musculoskeletal:       Right wrist: Normal.       Left wrist: Normal.       Right hand: Normal. normal sensation noted. Normal strength noted.       Left hand: Normal. normal sensation noted. Normal strength noted.  Neurological: She is alert.       Neg Tinnel's and Phalen's.   Skin: Skin  is warm and dry.  Psychiatric: She has a normal mood and affect.    ED Course  Procedures (including critical care time)  Labs Reviewed - No data to display No results found.   1. Bilateral hand pain       MDM  Bilat hand pain and tingling x 4-5 wks. 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 Overuse syndrome vs new onset peripheral neuropathy such as CTS.        Melody Comas, [place] 10/03/11 1810

## 2011-10-03 NOTE — ED Notes (Signed)
C/o bilateral hand tingling, with swelling and burning sensation to hands at night for 5-6 weeks.  Lt is more painful than rt.  Also c/o swelling to lower legs for last 2 days.

## 2011-10-06 NOTE — ED Provider Notes (Signed)
Medical screening examination/treatment/procedure(s) were performed by non-physician practitioner and as supervising physician I was immediately available for consultation/collaboration.   [name],[name]; MD   Sheri Dollar Moreno-Coll, MD 10/06/11 2302 

## 2011-10-13 ENCOUNTER — Encounter: Payer: Self-pay | Admitting: Internal Medicine

## 2011-10-13 ENCOUNTER — Ambulatory Visit (INDEPENDENT_AMBULATORY_CARE_PROVIDER_SITE_OTHER): Payer: Self-pay | Admitting: Internal Medicine

## 2011-10-13 ENCOUNTER — Ambulatory Visit (HOSPITAL_COMMUNITY)
Admission: RE | Admit: 2011-10-13 | Discharge: 2011-10-13 | Disposition: A | Discharge status: Home / Self Care | Payer: Self-pay | Source: Ambulatory Visit | Attending: Internal Medicine | Admitting: Internal Medicine

## 2011-10-13 ENCOUNTER — Other Ambulatory Visit: Payer: Self-pay | Admitting: Internal Medicine

## 2011-10-13 VITALS — BP 106/71 | HR 99 | Temp 97.2°F | Ht 65.5 in | Wt 143.9 lb

## 2011-10-13 DIAGNOSIS — M19049 Primary osteoarthritis, unspecified hand: Secondary | ICD-10-CM | POA: Insufficient documentation

## 2011-10-13 DIAGNOSIS — Z79899 Other long term (current) drug therapy: Secondary | ICD-10-CM

## 2011-10-13 DIAGNOSIS — K219 Gastro-esophageal reflux disease without esophagitis: Secondary | ICD-10-CM

## 2011-10-13 DIAGNOSIS — M79642 Pain in left hand: Secondary | ICD-10-CM

## 2011-10-13 DIAGNOSIS — R0602 Shortness of breath: Secondary | ICD-10-CM

## 2011-10-13 DIAGNOSIS — M79609 Pain in unspecified limb: Secondary | ICD-10-CM

## 2011-10-13 DIAGNOSIS — G4720 Circadian rhythm sleep disorder, unspecified type: Secondary | ICD-10-CM

## 2011-10-13 DIAGNOSIS — R002 Palpitations: Secondary | ICD-10-CM

## 2011-10-13 DIAGNOSIS — M79641 Pain in right hand: Secondary | ICD-10-CM

## 2011-10-13 LAB — URIC ACID: Uric Acid, Serum: 2.5 mg/dL (ref 2.4–7.0)

## 2011-10-13 LAB — COMPREHENSIVE METABOLIC PANEL
ALT: 20 U/L (ref 0–35)
AST: 22 U/L (ref 0–37)
BUN: 13 mg/dL (ref 6–23)
Creat: 0.75 mg/dL (ref 0.50–1.10)
Total Bilirubin: 0.2 mg/dL — ABNORMAL LOW (ref 0.3–1.2)

## 2011-10-13 MED ORDER — ALBUTEROL SULFATE HFA 108 (90 BASE) MCG/ACT IN AERS: 2.0000 | INHALATION_SPRAY | RESPIRATORY_TRACT | Status: DC | PRN

## 2011-10-13 MED ORDER — TRAZODONE HCL 100 MG PO TABS: 100.0000 mg | ORAL_TABLET | Freq: Every day | ORAL | Status: DC

## 2011-10-13 MED ORDER — OMEPRAZOLE 20 MG PO TBEC: 1.0000 | DELAYED_RELEASE_TABLET | Freq: Every day | ORAL | Status: DC

## 2011-10-13 MED ORDER — PROPRANOLOL HCL 20 MG PO TABS: 10.0000 mg | ORAL_TABLET | Freq: Two times a day (BID) | ORAL | Status: DC

## 2011-10-13 NOTE — Progress Notes (Signed)
Patient ID: Sheri [name], female   DOB: August 05, 1950, 61 y.o.   MRN: [id number]  HPI:   Patient is a 61 year old female with past medical history listed below, presents to the outpatient clinic with complaints of bilateral wrist pain more left than right, she was seen by urgent care and was told that she likely has carpal tunnel syndrome, was given splints to wear at nighttime, which patient said has partially relieved her symptoms, she is also taking over-the-counter pain relievers with some success. She complains that recently her arms both swelled when she woke up in the morning. Denies any fever or chills, no other complaints today.   Review of Systems: Negative except per history of present illness  Physical Exam:  Nursing notes and vitals reviewed General:  alert, well-developed, and cooperative to examination.   Lungs:  normal respiratory effort, no accessory muscle use, normal breath sounds, no crackles, and no wheezes. Heart:  normal rate, regular rhythm, no murmurs, no gallop, and no rub.   Abdomen:  soft, non-tender, normal bowel sounds, no distention, no guarding, no rebound tenderness, no hepatomegaly, and no splenomegaly.   Extremities:  No cyanosis, clubbing, edema. Bilateral wrist pain, with normal range of motion.  Neurologic:  alert & oriented X3, nonfocal exam  Meds: Medications Prior to Admission  Medication Sig Dispense Refill  . ALPRAZolam (XANAX) 0.5 MG tablet Take 0.5 mg by mouth 4 (four) times daily.        Marland Kitchen gabapentin (NEURONTIN) 300 MG capsule Take 300 mg by mouth 3 (three) times daily.        . methylphenidate (RITALIN LA) 10 MG 24 hr capsule Take 1 tablet by mouth twice a day       . QUEtiapine (SEROQUEL) 25 MG tablet 1/2 tablet by mouth once a day       . venlafaxine (EFFEXOR-XR) 75 MG 24 hr capsule 75 mg. Tale 2 capsules in morning, 1 at noon and 1 in the evening.        No current facility-administered medications on file as of 10/13/2011.     Allergies: Guaifenesin Past Medical History  Diagnosis Date  . Anxiety   . Depression     treated at [facility]  . Poisoning by selective serotonin reuptake inhibitors Jan 2007    severe reaction with MAOI (nardil) and dextromethorphan  . Goiter     hx of goiter  . Allergic rhinitis   . Chronic sinusitis    Past Surgical History  Procedure Date  . Right mastoidectomy     ET tube placement in [address] and 1990  . Thyroidectomy, partial     nodule removal - April 1996  . Right sinus surgery october 2000  . Ovarian cyst removal march 1969    left ovarian cystectomy   Family History  Problem Relation Age of Onset  . Hypertension Mother   . Hypertension Father   . Psoriasis Father    History   Social History  . Marital Status: Divorced    Spouse Name: N/A    Number of Children: N/A  . 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 Smoking status: Never Smoker   . Smokeless tobacco: Not on file  . Alcohol Use: No  . Drug Use: No  . Sexually Active: Not on file   Other Topics Concern  . Not on file  Social History Narrative   Lives alone.

## 2011-10-13 NOTE — Assessment & Plan Note (Signed)
This is likely related to carpal tunnel syndrome, continue recommending wearing brace at that time, also given the patient's complaints of joint swelling bilaterally, will check CBC, complete metabolic panel, bilateral hand x-ray. and Rheumatological workup to include ESR, RF, ANA, uric acid.

## 2011-10-13 NOTE — Patient Instructions (Signed)
--   Please take your medications as prescribed.

## 2011-10-14 LAB — RHEUMATOID FACTOR: Rhuematoid fact SerPl-aCnc: <10 IU/mL (ref <=14)

## 2011-10-14 LAB — SEDIMENTATION RATE: Sed Rate: 1 mm/hr (ref 0–22)

## 2011-10-14 LAB — CBC
MCH: 29.3 pg (ref 26.0–34.0)
MCV: 91.8 fL (ref 78.0–100.0)
Platelets: 348 10*3/uL (ref 150–400)
RDW: 12.3 % (ref 11.5–15.5)

## 2012-07-15 ENCOUNTER — Encounter (HOSPITAL_COMMUNITY): Payer: Self-pay

## 2012-07-15 ENCOUNTER — Emergency Department (INDEPENDENT_AMBULATORY_CARE_PROVIDER_SITE_OTHER)
Admission: EM | Admit: 2012-07-15 | Discharge: 2012-07-15 | Disposition: A | Discharge status: Home / Self Care | Payer: Self-pay | Source: Home / Self Care

## 2012-07-15 DIAGNOSIS — K219 Gastro-esophageal reflux disease without esophagitis: Secondary | ICD-10-CM

## 2012-07-15 DIAGNOSIS — J309 Allergic rhinitis, unspecified: Secondary | ICD-10-CM

## 2012-07-15 DIAGNOSIS — R05 Cough: Secondary | ICD-10-CM

## 2012-07-15 DIAGNOSIS — F411 Generalized anxiety disorder: Secondary | ICD-10-CM

## 2012-07-15 MED ORDER — RANITIDINE HCL 150 MG PO TABS: 150.0000 mg | ORAL_TABLET | Freq: Two times a day (BID) | ORAL | Status: DC

## 2012-07-15 MED ORDER — LORATADINE 10 MG PO TABS: 10.0000 mg | ORAL_TABLET | Freq: Every day | ORAL | Status: DC

## 2012-07-15 MED ORDER — BENZONATATE 100 MG PO CAPS: 100.0000 mg | ORAL_CAPSULE | Freq: Three times a day (TID) | ORAL | Status: DC | PRN

## 2012-07-15 NOTE — ED Provider Notes (Signed)
History     CSN: [id number]  Arrival date & time 07/15/12  1739   First MD Initiated Contact with Patient 07/15/12 1916      Chief Complaint  Patient presents with  . URI    (Consider location/radiation/quality/duration/timing/severity/associated sxs/prior treatment) HPI   Patient comes to clinic with two-day history of nasal congestion, stuffy nose, cough, hoarse voice. Patient has history of allergy to animal dander, and had 4 sinus surgeries in the past. She denies shortness of breath, though she has been taking her inhaler which did not help her.  Past Medical History  Diagnosis Date  . Anxiety   . Depression     treated at [facility]  . Poisoning by selective serotonin reuptake inhibitors(969.03) Jan 2007    severe reaction with MAOI (nardil) and dextromethorphan  . Goiter     hx of goiter  . Allergic rhinitis   . Chronic sinusitis     Past Surgical History  Procedure Date  . Right mastoidectomy     ET tube placement in [address]644 and 1990  . Thyroidectomy, partial     nodule removal - April 1996  . Right sinus surgery october 2000  . Ovarian cyst removal march 1969    left ovarian cystectomy    Family History  Problem Relation Age of Onset  . Hypertension Mother   . Hypertension Father   . Psoriasis Father     History  Substance Use Topics  . Smoking status: Never Smoker   . Smokeless tobacco: Not on file  . Alcohol Use: No    OB History    Grav Para Term Preterm Abortions TAB SAB Ect Mult Living                  Review of Systems  Ear nose throat: No tinnitus, positive runny nose, positive scratchy throat, positive cough Chest: No shortness of breath GI: No nausea vomiting or diarrhea  Allergies  Guaifenesin  Home Medications   Current Outpatient Rx  Name  Route  Sig  Dispense  Refill  . ALBUTEROL SULFATE HFA 108 (90 BASE) MCG/ACT IN AERS   Inhalation   Inhale 2 puffs into the lungs every 4 (four) hours as needed.   1  Inhaler   3   . ALPRAZOLAM 0.5 MG PO TABS   Oral   Take 0.5 mg by mouth 4 (four) times daily.           Marland Kitchen GABAPENTIN 300 MG PO CAPS   Oral   Take 300 mg by mouth 3 (three) times daily.           . METHYLPHENIDATE HCL ER (LA) 10 MG PO CP24      Take 1 tablet by mouth twice a day          . BENZONATATE 100 MG PO CAPS   Oral   Take 1 capsule (100 mg total) by mouth 3 (three) times daily as needed for cough.   20 capsule   0   . LORATADINE 10 MG PO TABS   Oral   Take 1 tablet (10 mg total) by mouth daily.   30 tablet   2   . OMEPRAZOLE 20 MG PO TBEC   Oral   Take 1 tablet (20 mg total) by mouth daily.   30 each   3   . PROPRANOLOL HCL 20 MG PO TABS   Oral   Take 0.5 tablets (10 mg total) by mouth 2 (two)  times daily. Take 1/2 tablet twice a day.   30 tablet   3   . QUETIAPINE FUMARATE 25 MG PO TABS      1/2 tablet by mouth once a day          . RANITIDINE HCL 150 MG PO TABS   Oral   Take 1 tablet (150 mg total) by mouth 2 (two) times daily.   30 tablet   0   . VENLAFAXINE HCL ER 75 MG PO CP24      75 mg. Tale 2 capsules in morning, 1 at noon and 1 in the evening.            BP 121/76  Pulse 100  Temp 98.6 F (37 C) (Oral)  Resp 21  SpO2 100%  Physical Exam Head: NCAT Eyes: extraocular muscle intact Ear nose throat: tympanic membranes are clear, nasal mucosa mildly hyperemic, pharyngeal mucosa not hyperemic Chest: Clear to condition bilaterally no wheezing no crackles Heart: S1-S2 regular in rate and rhythm ED Course  Procedures (including critical care time)  Labs Reviewed - No data to display No results found.   1. GERD   2. Cough   3. Allergic rhinitis    GERD Patient has history of GERD which can cause hoarseness of voice. Patient cannot afford PPIs as she only goes to [facility] for a prescription drug plan so we'll give her prescription for Zantac 150 ampule twice a day for 2 weeks  Cough Will give Tessalon Perles when  necessary  Allergic Rhinitis Will prescribe Claritin 10 mg by mouth daily  Follow up in clinic in 2 weeks    MDM          Meredeth Ide, MD 07/15/12 [phone]

## 2012-07-15 NOTE — ED Notes (Signed)
Patient states has been having cold symptoms for three days .Marland Kitchen Complain of cough congestion stuffy nose

## 2012-07-20 ENCOUNTER — Encounter (HOSPITAL_COMMUNITY): Payer: Self-pay

## 2012-07-20 ENCOUNTER — Emergency Department (INDEPENDENT_AMBULATORY_CARE_PROVIDER_SITE_OTHER)
Admission: EM | Admit: 2012-07-20 | Discharge: 2012-07-20 | Disposition: A | Discharge status: Home / Self Care | Payer: Self-pay | Source: Home / Self Care

## 2012-07-20 DIAGNOSIS — J069 Acute upper respiratory infection, unspecified: Secondary | ICD-10-CM

## 2012-07-20 DIAGNOSIS — J40 Bronchitis, not specified as acute or chronic: Secondary | ICD-10-CM

## 2012-07-20 MED ORDER — PROMETHAZINE-DM 6.25-15 MG/5ML PO SYRP: 5.0000 mL | ORAL_SOLUTION | Freq: Four times a day (QID) | ORAL | Status: DC | PRN

## 2012-07-20 MED ORDER — DOXYCYCLINE HYCLATE 100 MG PO CAPS: 100.0000 mg | ORAL_CAPSULE | Freq: Two times a day (BID) | ORAL | Status: DC

## 2012-07-20 NOTE — ED Notes (Signed)
Patient complains of cough and congestion has not gotten better.

## 2012-07-22 ENCOUNTER — Telehealth: Payer: Self-pay | Admitting: *Deleted

## 2012-07-22 NOTE — Telephone Encounter (Signed)
Pt called left message that she needed to be seen for possible sinus infection? Bronchitis? Has been to ED twice recently. Called pt back, no answer, left message for her to rtc to clinic asap

## 2012-07-22 NOTE — ED Provider Notes (Signed)
History     CSN: [id number]  Arrival date & time 07/20/12  1243   None     Chief Complaint  Patient presents with  . URI    (Consider location/radiation/quality/duration/timing/severity/associated sxs/prior treatment) Patient is a 62 y.o. female presenting with URI. The history is provided by the patient.  URI The primary symptoms include fever, fatigue, headaches and cough. The current episode started 6 to 7 days ago. This is a recurrent problem. The problem has been gradually worsening.  The fever began 2 days ago. The fever has been gradually worsening since its onset. The maximum temperature recorded prior to her arrival was unknown.  The fatigue began today. The fatigue has been unchanged since its onset.  The headache began today.  The cough began 6 to 7 days ago. The cough is productive, barking, croupy and hoarse. The sputum is yellow.  The onset of the illness is associated with exposure to sick contacts. Symptoms associated with the illness include chills, congestion and rhinorrhea. The following treatments were addressed: Acetaminophen was ineffective. A decongestant was ineffective. Risk factors for severe complications from URI include chronic respiratory disease.    Past Medical History  Diagnosis Date  . Anxiety   . Depression     treated at [facility]  . Poisoning by selective serotonin reuptake inhibitors(969.03) Jan 2007    severe reaction with MAOI (nardil) and dextromethorphan  . Goiter     hx of goiter  . Allergic rhinitis   . Chronic sinusitis     Past Surgical History  Procedure Date  . Right mastoidectomy     ET tube placement in [address] and 1990  . Thyroidectomy, partial     nodule removal - April 1996  . Right sinus surgery october 2000  . Ovarian cyst removal march 1969    left ovarian cystectomy    Family History  Problem Relation Age of Onset  . Hypertension Mother   . Hypertension Father   . Psoriasis Father     History    Substance Use Topics  . Smoking status: Never Smoker   . Smokeless tobacco: Not on file  . Alcohol Use: No    OB History    Grav Para Term Preterm Abortions TAB SAB Ect Mult Living                  Review of Systems  Constitutional: Positive for fever, chills and fatigue.  HENT: Positive for congestion and rhinorrhea.   Respiratory: Positive for cough.   Neurological: Positive for headaches.    Allergies  Guaifenesin  Home Medications   Current Outpatient Rx  Name  Route  Sig  Dispense  Refill  . ALBUTEROL SULFATE HFA 108 (90 BASE) MCG/ACT IN AERS   Inhalation   Inhale 2 puffs into the lungs every 4 (four) hours as needed.   1 Inhaler   3   . ALPRAZOLAM 0.5 MG PO TABS   Oral   Take 0.5 mg by mouth 4 (four) times daily.           Marland Kitchen BENZONATATE 100 MG PO CAPS   Oral   Take 1 capsule (100 mg total) by mouth 3 (three) times daily as needed for cough.   20 capsule   0   . DOXYCYCLINE HYCLATE 100 MG PO CAPS   Oral   Take 1 capsule (100 mg total) by mouth 2 (two) times daily.   20 capsule   0   . GABAPENTIN  300 MG PO CAPS   Oral   Take 300 mg by mouth 3 (three) times daily.           Marland Kitchen LORATADINE 10 MG PO TABS   Oral   Take 1 tablet (10 mg total) by mouth daily.   30 tablet   2   . METHYLPHENIDATE HCL ER (LA) 10 MG PO CP24      Take 1 tablet by mouth twice a day          . OMEPRAZOLE 20 MG PO TBEC   Oral   Take 1 tablet (20 mg total) by mouth daily.   30 each   3   . PROMETHAZINE-DM 6.25-15 MG/5ML PO SYRP   Oral   Take 5 mLs by mouth 4 (four) times daily as needed for cough.   120 mL   0   . PROPRANOLOL HCL 20 MG PO TABS   Oral   Take 0.5 tablets (10 mg total) by mouth 2 (two) times daily. Take 1/2 tablet twice a day.   30 tablet   3   . QUETIAPINE FUMARATE 25 MG PO TABS      1/2 tablet by mouth once a day          . RANITIDINE HCL 150 MG PO TABS   Oral   Take 1 tablet (150 mg total) by mouth 2 (two) times daily.   30 tablet    0   . VENLAFAXINE HCL ER 75 MG PO CP24      75 mg. Tale 2 capsules in morning, 1 at noon and 1 in the evening.            BP 109/70  Pulse 76  Temp 99 F (37.2 C) (Oral)  SpO2 99%  Physical Exam  Constitutional: She is oriented to person, place, and time. She appears well-developed and well-nourished.  HENT:  Head: Normocephalic and atraumatic.  Nose: Nose normal.  Mouth/Throat: Oropharynx is clear and moist. No oropharyngeal exudate.  Eyes: Pupils are equal, round, and reactive to light.  Neck: Normal range of motion.  Cardiovascular: Normal rate, regular rhythm and normal heart sounds.   Pulmonary/Chest: Effort normal and breath sounds normal. No respiratory distress. She has no wheezes. She has no rales. She exhibits no tenderness.  Abdominal: Soft. Bowel sounds are normal.  Musculoskeletal: Normal range of motion.  Lymphadenopathy:    She has no cervical adenopathy.  Neurological: She is alert and oriented to person, place, and time.  Skin: Skin is warm and dry.  Psychiatric: She has a normal mood and affect. Her behavior is normal.    ED Course  Procedures (including critical care time)  Labs Reviewed - No data to display No results found.   No diagnosis found.    MDM  Assessment: URI, now >1 week of worsening symptoms, failed conservative measures with decongestants and tylenol. No rales or signs of PNA on exam, not-toxic appearing, no hypoxia or wheezing.  Plan: -Will treat for secondary bacterial bronchitis with Doxycycline for 7 days and Hycodan for cough prn. Encouraged rest and fluids. May take several weeks to fully resolve her cough.        Edsel Petrin, DO 07/22/12 0140

## 2012-07-22 NOTE — Telephone Encounter (Signed)
Thank you. Agree with acute appt.

## 2012-07-22 NOTE — Telephone Encounter (Signed)
Pt called back, she wants to be seen asap, the only appt available before 1/10 is a cancellation in dr [facility] 1/3 at 1315. She states she is getting worse and her 2 visits to ED/ urg care have not helped. She is placed in the 1315 1/3 appt w/ dr Dorise Hiss per Charlynn Grimes.

## 2012-07-23 ENCOUNTER — Encounter: Payer: Self-pay | Admitting: Internal Medicine

## 2012-07-23 ENCOUNTER — Ambulatory Visit (INDEPENDENT_AMBULATORY_CARE_PROVIDER_SITE_OTHER): Payer: Self-pay | Admitting: Internal Medicine

## 2012-07-23 VITALS — BP 113/72 | HR 96 | Temp 98.1°F | Ht 65.5 in | Wt 135.8 lb

## 2012-07-23 DIAGNOSIS — J019 Acute sinusitis, unspecified: Secondary | ICD-10-CM

## 2012-07-23 NOTE — Progress Notes (Signed)
Internal Medicine Clinic Visit    HPI:  Sheri [name] is a 62 y.o. year old female with a history of anxiety, depression, GERD recent cold who presents as an acute visit for nasal congestion and sinus problems.   She was seen in urgent care on 12/26 for symptoms of congestion, cough and was given claritin and tessalon perles.   Started 10 days ago with hoarse voice, nasal congestion, no sore throat. Seen at urgent care 12/26, symptoms thought to be a combination of GERD and allergic rhinitis - given loratidine, tesslon, and antacid. Several days later, she started coughing, took nyquill. Went back to urgent care, dx with bronchitis, given rx for doxycycline and cough syrup. Started doxy on Tuesday, unhappy because of the high cost of the medicine.  Currently, cough has improved but she has developed facial and eye pain, feels like squeezing. Clear nasal drainage. Starting to feel better. No fever or chills, low grade 99.5 recorded at home.  Occasionally has sinus problems, but nothing this bad. She does have allergy symptoms of runny nose, itchy watery eyes that are worse around her dog.  Denies shortness of breath or chest pain. Denies sick contacts. Did not have the flu shot this year.    Past Medical History  Diagnosis Date  . Anxiety   . Depression     treated at [facility]  . Poisoning by selective serotonin reuptake inhibitors(969.03) Jan 2007    severe reaction with MAOI (nardil) and dextromethorphan  . Goiter     hx of goiter  . Allergic rhinitis   . Chronic sinusitis     Past Surgical History  Procedure Date  . Right mastoidectomy     ET tube placement in [address] and 1990  . Thyroidectomy, partial     nodule removal - April 1996  . Right sinus surgery october 2000  . Ovarian cyst removal march 1969    left ovarian cystectomy     ROS:  A complete review of systems was otherwise negative, except as noted in the  HPI.  Allergies: Guaifenesin  Medications: Current Outpatient Prescriptions  Medication Sig Dispense Refill  . albuterol (PROAIR HFA) 108 (90 BASE) MCG/ACT inhaler Inhale 2 puffs into the lungs every 4 (four) hours as needed.  1 Inhaler  3  . ALPRAZolam (XANAX) 0.5 MG tablet Take 0.5 mg by mouth 4 (four) times daily.        . benzonatate (TESSALON PERLES) 100 MG capsule Take 1 capsule (100 mg total) by mouth 3 (three) times daily as needed for cough.  20 capsule  0  . doxycycline (VIBRAMYCIN) 100 MG capsule Take 1 capsule (100 mg total) by mouth 2 (two) times daily.  20 capsule  0  . gabapentin (NEURONTIN) 300 MG capsule Take 300 mg by mouth 3 (three) times daily.        Marland Kitchen loratadine (CLARITIN) 10 MG tablet Take 1 tablet (10 mg total) by mouth daily.  30 tablet  2  . methylphenidate (RITALIN LA) 10 MG 24 hr capsule Take 1 tablet by mouth twice a day       . Omeprazole (TGT OMEPRAZOLE) 20 MG TBEC Take 1 tablet (20 mg total) by mouth daily.  30 each  3  . promethazine-dextromethorphan (PROMETHAZINE-DM) 6.25-15 MG/5ML syrup Take 5 mLs by mouth 4 (four) times daily as needed for cough.  120 mL  0  . propranolol (INDERAL) 20 MG tablet Take 0.5 tablets (10 mg total) by mouth 2 (two)  times daily. Take 1/2 tablet twice a day.  30 tablet  3  . QUEtiapine (SEROQUEL) 25 MG tablet 1/2 tablet by mouth once a day       . ranitidine (ZANTAC) 150 MG tablet Take 1 tablet (150 mg total) by mouth 2 (two) times daily.  30 tablet  0  . venlafaxine (EFFEXOR-XR) 75 MG 24 hr capsule 75 mg. Tale 2 capsules in morning, 1 at noon and 1 in the evening.         History   Social History  . Marital Status: Divorced    Spouse Name: N/A    Number of Children: N/A  . 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 Smoking status: Never Smoker   . Smokeless tobacco: Not on file  . Alcohol Use: No  . Drug Use: No  . Sexually Active: Not on file    Other Topics Concern  . Not on file   Social History Narrative   Lives alone.    family history includes Hypertension in her father and mother and Psoriasis in her father.  Physical Exam There were no vitals taken for this visit. General:  No acute distress, alert and oriented x 3, well-appearing  HEENT:  PERRL, EOMI, no lymphadenopathy, moist mucous membranes, oropharynx clear, no exudate. Nasal mucosal with erythema, mild clear drainage Cardiovascular:  Regular rate and rhythm, no murmurs, rubs or gallops Respiratory:  Clear to auscultation bilaterally, no wheezes, rales, or rhonchi Abdomen:  Soft, nondistended, nontender, normoactive bowel sounds Extremities:  Warm and well-perfused, no clubbing, cyanosis, or edema.  Skin: Warm, dry, no rashes Neuro: Not anxious appearing, no depressed mood, normal affect  Labs: Lab Results  Component Value Date   CREATININE 0.75 10/13/2011   BUN 13 10/13/2011   NA 141 10/13/2011   K 5.0 10/13/2011   CL 108 10/13/2011   CO2 29 10/13/2011   Lab Results  Component Value Date   WBC 6.8 10/13/2011   HGB 13.6 10/13/2011   HCT 42.6 10/13/2011   MCV 91.8 10/13/2011   PLT 348 10/13/2011      Assessment and Plan:    FOLLOWUP: Sheri [name] will follow back up in our clinic in approximately  1 month. Sheri [name] knows to call out clinic in the meantime with any questions or new issues.   Patient was seen and evaluated by Denton Ar, MD,  and Aletta Edouard, MD Attending Physician.

## 2012-07-23 NOTE — Assessment & Plan Note (Signed)
Patient with sinus pressure, subjective fevers (and one low grade 99.5 recorded at home) after what sounds like a viral URI. Seen at urgent care and given doxycycline for bronchitis. Likely viral sinusitis but unclear picture, may be some bacterial component as well. Symptoms not consistent with flu.  Patient already taking doxycycline and she is adamant about continuing this medication now that she's paid for it. Will cont doxycycline to cover for bacterial sinusitis and treat symptomatically with nasal gel with aloe and hot showers for steam. As there may be an allergic component to her symptoms (itchy eyes, runny nose), will advise to continue loratidine daily and allergen avoidance.  Patient will give a few days for the antibiotics to work and give [place] a call on Monday if she is not improving.

## 2012-09-08 ENCOUNTER — Ambulatory Visit (INDEPENDENT_AMBULATORY_CARE_PROVIDER_SITE_OTHER): Payer: Self-pay | Admitting: Internal Medicine

## 2012-09-08 ENCOUNTER — Encounter: Payer: Self-pay | Admitting: Internal Medicine

## 2012-09-08 ENCOUNTER — Other Ambulatory Visit (HOSPITAL_COMMUNITY)
Admission: RE | Admit: 2012-09-08 | Discharge: 2012-09-08 | Disposition: A | Discharge status: Home / Self Care | Payer: Self-pay | Source: Ambulatory Visit | Attending: Internal Medicine | Admitting: Internal Medicine

## 2012-09-08 VITALS — BP 105/64 | HR 100 | Temp 97.6°F | Wt 139.6 lb

## 2012-09-08 DIAGNOSIS — Z1151 Encounter for screening for human papillomavirus (HPV): Secondary | ICD-10-CM | POA: Insufficient documentation

## 2012-09-08 DIAGNOSIS — F411 Generalized anxiety disorder: Secondary | ICD-10-CM

## 2012-09-08 DIAGNOSIS — N764 Abscess of vulva: Secondary | ICD-10-CM

## 2012-09-08 DIAGNOSIS — R350 Frequency of micturition: Secondary | ICD-10-CM

## 2012-09-08 DIAGNOSIS — Z598 Other problems related to housing and economic circumstances: Secondary | ICD-10-CM

## 2012-09-08 DIAGNOSIS — R339 Retention of urine, unspecified: Secondary | ICD-10-CM

## 2012-09-08 DIAGNOSIS — F329 Major depressive disorder, single episode, unspecified: Secondary | ICD-10-CM

## 2012-09-08 DIAGNOSIS — Z0000 Encounter for general adult medical examination without abnormal findings: Secondary | ICD-10-CM

## 2012-09-08 DIAGNOSIS — N9089 Other specified noninflammatory disorders of vulva and perineum: Secondary | ICD-10-CM

## 2012-09-08 DIAGNOSIS — Z23 Encounter for immunization: Secondary | ICD-10-CM

## 2012-09-08 MED ORDER — SULFAMETHOXAZOLE-TRIMETHOPRIM 800-160 MG PO TABS: 1.0000 | ORAL_TABLET | Freq: Two times a day (BID) | ORAL | Status: AC

## 2012-09-08 MED ORDER — PENTAFLUOROPROP-TETRAFLUOROETH EX AERO: 1.0000 "application " | INHALATION_SPRAY | CUTANEOUS | Status: DC | PRN

## 2012-09-08 NOTE — Patient Instructions (Addendum)
General Instructions: -Apply a clean wash cloth with warm water to the swollen area for 20 minutes at a time, 4-5 times per day until the abscess is completely drained -Wash the area with warm water and soap daily and after you have a Bowel Movement -Take your antibiotic, Bactrim, twice per day for 10 days until you finish it completely. This antibiotic is $5 at [employer] and $4 at [facility]  -Use the Pain Ease spray only 2 to 3 times per day. Spray once, for one second at the painful area.  -You need to follow up at the Gynecology, [facility] clinic for further evaluation of your vulvar lesions.  -Call us or go to the ED if you develop fever/ chills, or if the are becomes more swollen, more painful, if it spreads, or becomes red.  -You need to see Rudell Cobb for the [employer].   Items required to complete an Eligibility Application for the [facility] Card  1. Picture ID (Can't be expired) 2. Current Bill to establish proof of residency 3. W-2 & Tax return (if self-employed include "Schedule C"), if not filing Form 4506 4. 4 current Pay stubs for this year 5. Printout of other income (Social security, unemployment, child support, workmen's comp) 6. Food stamp award letter, if receiving  7. Life Insurance (Need copy of the front page, showing name Ins Co. Name, and face amount). 8. Statement for pension, 401-K, IRS (needs to have current balance) 9. Tax Value for cars, houses, mobile homes, and land (Get from [facility] Tax Department) 10. Disability Paperwork (showing status of case) 11. College students: Print out of [place] received, tuition cost, books, etc. 12. If no Income: Engineer, maintenance of support for free shelter, money, food, Catering manager.  Bring all that you can to your follow up appointment to start the process.   Treatment Goals:  Goals (1 Years of Data) as of 09/08/12   None      Progress Toward Treatment Goals:  Treatment Goal 09/08/2012  Other  improved     Self Care Goals & Plans:  Self Care Goal 09/08/2012  Manage my medications take my medicines as prescribed; refill my medications on time; bring my medications to every visit       Care Management & Community Referrals:  Referral 09/08/2012  Referrals made for care management support financial counselor

## 2012-09-09 DIAGNOSIS — Z598 Other problems related to housing and economic circumstances: Secondary | ICD-10-CM | POA: Insufficient documentation

## 2012-09-09 DIAGNOSIS — N764 Abscess of vulva: Secondary | ICD-10-CM | POA: Insufficient documentation

## 2012-09-09 DIAGNOSIS — R339 Retention of urine, unspecified: Secondary | ICD-10-CM | POA: Insufficient documentation

## 2012-09-09 DIAGNOSIS — N9089 Other specified noninflammatory disorders of vulva and perineum: Secondary | ICD-10-CM | POA: Insufficient documentation

## 2012-09-09 DIAGNOSIS — Z599 Problem related to housing and economic circumstances, unspecified: Secondary | ICD-10-CM | POA: Insufficient documentation

## 2012-09-09 LAB — URINALYSIS, ROUTINE W REFLEX MICROSCOPIC
Bilirubin Urine: NEGATIVE
Protein, ur: NEGATIVE mg/dL
Urobilinogen, UA: 0.2 mg/dL (ref 0.0–1.0)

## 2012-09-09 NOTE — Assessment & Plan Note (Addendum)
This is acute and likely due to UTI. She will be treated with Bactrim for vulvar abscess -UA -urine culture

## 2012-09-09 NOTE — Assessment & Plan Note (Addendum)
Patient anxious today but likely 2/2 to pain for her current abscess. She is followed at [facility]

## 2012-09-09 NOTE — Progress Notes (Signed)
  Subjective:    Patient ID: Sheri [name], female    DOB: [birth year], 62 y.o.   MRN: [id number]  HPI Sheri [name] is a 62 year old woman with PMH of anxiety, depression, and remote UTI who comes in for evaluation of vulvar abscesses. She reports that about 10 days ago she noticed a "knot" on her right labia majora that progressively increased in size and now has become exquisitely painful. She also noted a second "knot" on the right side, posterior to her labia that has drained on its own and decreased in size. For the past 3-4 days she has had urinary retention with increased diuresis at night, with up to 16oz of urine with one voiding episode--she measured her urine output last night. In addition, she has had scant white vaginal discharge for weeks now. She denies vaginal bleeding or pain.    Review of Systems  Constitutional: Negative for fever, chills, diaphoresis, activity change, appetite change, fatigue and unexpected weight change.  Respiratory: Negative for cough and shortness of breath.   Cardiovascular: Negative for chest pain.  Gastrointestinal: Negative for nausea, vomiting, abdominal pain, diarrhea, constipation, blood in stool and rectal pain.  Endocrine: Negative for polydipsia and polyuria.  Genitourinary: Positive for vaginal discharge and difficulty urinating. Negative for dysuria, urgency, frequency, flank pain, decreased urine volume, vaginal bleeding, vaginal pain and pelvic pain.  Musculoskeletal: Negative for myalgias, back pain and arthralgias.  Skin: Negative for rash.  Neurological: Negative for dizziness, tremors, weakness, light-headedness and headaches.  Psychiatric/Behavioral: Negative for behavioral problems and agitation.       Objective:   Physical Exam  Nursing note and vitals reviewed. Constitutional: She is oriented to person, place, and time. She appears well-developed and well-nourished. No distress.  Thin but with no temporal wasting  HENT:  Nose: Nose  normal.  Eyes: Conjunctivae are normal. Right eye exhibits no discharge. Left eye exhibits no discharge. No scleral icterus.  Neck: Neck supple.  Cardiovascular: Normal rate.   Pulmonary/Chest: Effort normal. No respiratory distress.  Abdominal: Soft. She exhibits no distension and no mass. There is no tenderness. There is no rebound and no guarding.  Genitourinary: Uterus normal. Vaginal discharge found.  No inguinal lymphadenopathy  Scant white/yellow vaginal discharge with no odor.  Pelvic exam and Pap smear performed. No cervical motion tenderness, cervix with expected fragile tissue and mild bleeding with cervical brush turning. No vaginal mass or tears. Ovaries not enlarged by exam.   Vulvar abscess on the right, at 8 o'clock with induration of ~2cmx2cm with encrusted purulent discharge, non fluctuant, tender to palpation, with mild erythema but no surrounding cellulitis.  Vulvar abscess on the posterior right, at 7 o'clock, healing with pink granulation tissue, with no induration or discharge.   Vulvar lesion, on labia minora on the left, below hood of clitoris. Lesion is non-puritic with a purplish discoloration, with uneven border,  ~ 2cm in length,  reported as present for weeks with moisture.     Musculoskeletal: She exhibits no edema and no tenderness.  Neurological: She is alert and oriented to person, place, and time.  Skin: Skin is warm and dry. No rash noted. She is not diaphoretic. No erythema. No pallor.  Psychiatric: Her behavior is normal.  Anxious appearing          Assessment & Plan:

## 2012-09-09 NOTE — Assessment & Plan Note (Signed)
She has vulvar lesion at 2 o'clock that is suspicious for malignancy and likely will require biopsy.  -Pt given referral to [facility]

## 2012-09-09 NOTE — Assessment & Plan Note (Addendum)
On exam this does not appear to be widespread. She has one abscess that is indurated, non fluctuant and some crusted pustular discharge, currently not draining on its own.  -Pt was counseled to apply warm compresses to the area and let the abscess drain on its own -Bactrim DS BID for 10 days -Follow up/referral with GYN-[facility] -Pt instructed to return to clinic or go to the ED if her symptoms worsen or if she develops a fever.  -Pt was given [facility]PC sample of Pain Ease, topical benzocaine spray for pain relief.

## 2012-09-09 NOTE — Assessment & Plan Note (Signed)
Stable today, pt is followed at [facility]

## 2012-09-09 NOTE — Assessment & Plan Note (Signed)
She reports financial hardship, with difficulty paying for her medications.  -Referred pt to [facility] card application, provided her a list of documents for the application.

## 2012-09-22 ENCOUNTER — Encounter: Payer: Self-pay | Admitting: Obstetrics and Gynecology

## 2012-09-28 ENCOUNTER — Other Ambulatory Visit: Payer: Self-pay | Admitting: Internal Medicine

## 2012-09-28 DIAGNOSIS — Z1231 Encounter for screening mammogram for malignant neoplasm of breast: Secondary | ICD-10-CM

## 2012-10-06 ENCOUNTER — Ambulatory Visit (HOSPITAL_COMMUNITY): Payer: Self-pay

## 2012-10-11 ENCOUNTER — Encounter: Payer: Self-pay | Admitting: Obstetrics and Gynecology

## 2012-11-01 ENCOUNTER — Other Ambulatory Visit: Payer: Self-pay | Admitting: *Deleted

## 2012-11-01 ENCOUNTER — Ambulatory Visit (HOSPITAL_COMMUNITY): Payer: Self-pay | Attending: Internal Medicine

## 2012-11-01 DIAGNOSIS — R002 Palpitations: Secondary | ICD-10-CM

## 2012-11-04 NOTE — Telephone Encounter (Signed)
Flag sent to front desk pool for appt. 

## 2012-11-08 ENCOUNTER — Encounter: Payer: Self-pay | Admitting: *Deleted

## 2012-12-15 ENCOUNTER — Ambulatory Visit: Payer: Self-pay

## 2012-12-15 ENCOUNTER — Encounter: Payer: Self-pay | Admitting: Internal Medicine

## 2012-12-15 ENCOUNTER — Telehealth: Payer: Self-pay | Admitting: *Deleted

## 2012-12-15 DIAGNOSIS — R002 Palpitations: Secondary | ICD-10-CM

## 2012-12-15 MED ORDER — PROPRANOLOL HCL 20 MG PO TABS: 10.0000 mg | ORAL_TABLET | Freq: Two times a day (BID) | ORAL | Status: DC

## 2012-12-15 NOTE — Telephone Encounter (Signed)
Pt called to inform office that she will not be able to keep her appt scheduled for today.  She was rescheduled to 07/02, but needs a refill on her propranolol. Will forward to pcp for review.Sheri Spittle Cassady5/28/20144:13 PM

## 2012-12-30 ENCOUNTER — Ambulatory Visit: Payer: Self-pay

## 2013-01-02 ENCOUNTER — Encounter (HOSPITAL_COMMUNITY): Payer: Self-pay | Admitting: Emergency Medicine

## 2013-01-02 ENCOUNTER — Emergency Department (HOSPITAL_COMMUNITY): Payer: Self-pay

## 2013-01-02 ENCOUNTER — Emergency Department (HOSPITAL_COMMUNITY)
Admission: EM | Admit: 2013-01-02 | Discharge: 2013-01-02 | Disposition: A | Discharge status: Home / Self Care | Payer: No Typology Code available for payment source | Attending: Emergency Medicine | Admitting: Emergency Medicine

## 2013-01-02 DIAGNOSIS — M79671 Pain in right foot: Secondary | ICD-10-CM

## 2013-01-02 DIAGNOSIS — IMO0002 Reserved for concepts with insufficient information to code with codable children: Secondary | ICD-10-CM | POA: Insufficient documentation

## 2013-01-02 DIAGNOSIS — F411 Generalized anxiety disorder: Secondary | ICD-10-CM | POA: Insufficient documentation

## 2013-01-02 DIAGNOSIS — F329 Major depressive disorder, single episode, unspecified: Secondary | ICD-10-CM | POA: Insufficient documentation

## 2013-01-02 DIAGNOSIS — J329 Chronic sinusitis, unspecified: Secondary | ICD-10-CM | POA: Insufficient documentation

## 2013-01-02 DIAGNOSIS — Y92410 Unspecified street and highway as the place of occurrence of the external cause: Secondary | ICD-10-CM | POA: Insufficient documentation

## 2013-01-02 DIAGNOSIS — Y9389 Activity, other specified: Secondary | ICD-10-CM | POA: Insufficient documentation

## 2013-01-02 DIAGNOSIS — Z79899 Other long term (current) drug therapy: Secondary | ICD-10-CM | POA: Insufficient documentation

## 2013-01-02 DIAGNOSIS — F3289 Other specified depressive episodes: Secondary | ICD-10-CM | POA: Insufficient documentation

## 2013-01-02 DIAGNOSIS — R0789 Other chest pain: Secondary | ICD-10-CM

## 2013-01-02 MED ORDER — NAPROXEN 375 MG PO TABS: 375.0000 mg | ORAL_TABLET | Freq: Two times a day (BID) | ORAL | Status: DC

## 2013-01-02 MED ORDER — METHOCARBAMOL 500 MG PO TABS: 500.0000 mg | ORAL_TABLET | Freq: Two times a day (BID) | ORAL | Status: DC

## 2013-01-02 MED ORDER — HYDROCODONE-ACETAMINOPHEN 5-325 MG PO TABS: 1.0000 | ORAL_TABLET | Freq: Four times a day (QID) | ORAL | Status: DC | PRN
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
## 2013-01-07 NOTE — ED Provider Notes (Signed)
Medical screening examination/treatment/procedure(s) were performed by non-physician practitioner and as supervising physician I was immediately available for consultation/collaboration.   Richardean Canal, MD 01/07/13 [phone]

## 2013-01-14 ENCOUNTER — Encounter: Payer: Self-pay | Admitting: Internal Medicine

## 2013-01-19 ENCOUNTER — Encounter: Payer: Self-pay | Admitting: Internal Medicine

## 2013-01-20 ENCOUNTER — Encounter: Payer: Self-pay | Admitting: Internal Medicine

## 2013-01-20 ENCOUNTER — Ambulatory Visit (HOSPITAL_COMMUNITY)
Admission: RE | Admit: 2013-01-20 | Discharge: 2013-01-20 | Disposition: A | Discharge status: Home / Self Care | Payer: Self-pay | Source: Ambulatory Visit | Attending: Internal Medicine | Admitting: Internal Medicine

## 2013-01-20 ENCOUNTER — Ambulatory Visit (INDEPENDENT_AMBULATORY_CARE_PROVIDER_SITE_OTHER): Payer: Self-pay | Admitting: Internal Medicine

## 2013-01-20 VITALS — BP 100/60 | HR 109 | Temp 97.8°F | Ht 62.5 in | Wt 141.9 lb

## 2013-01-20 DIAGNOSIS — S2232XS Fracture of one rib, left side, sequela: Secondary | ICD-10-CM

## 2013-01-20 DIAGNOSIS — S2239XA Fracture of one rib, unspecified side, initial encounter for closed fracture: Secondary | ICD-10-CM | POA: Insufficient documentation

## 2013-01-20 DIAGNOSIS — IMO0002 Reserved for concepts with insufficient information to code with codable children: Secondary | ICD-10-CM

## 2013-01-20 DIAGNOSIS — R0781 Pleurodynia: Secondary | ICD-10-CM

## 2013-01-20 DIAGNOSIS — R079 Chest pain, unspecified: Secondary | ICD-10-CM

## 2013-01-20 MED ORDER — ALBUTEROL SULFATE HFA 108 (90 BASE) MCG/ACT IN AERS: 2.0000 | INHALATION_SPRAY | Freq: Four times a day (QID) | RESPIRATORY_TRACT | Status: DC | PRN

## 2013-01-20 NOTE — Patient Instructions (Addendum)
Please follow up with your xrays and diagnostic mammogram  Try ibuprofen otc prn for pain  If you notice shortness of breath, worsening pain, nausea, vomiting, fever, or any other symptoms, call the clinic right away [phone] or go to ED  Musculoskeletal Pain Musculoskeletal pain is muscle and boney aches and pains. These pains can occur in any part of the body. Your caregiver may treat you without knowing the cause of the pain. They may treat you if blood or urine tests, X-rays, and other tests were normal.  CAUSES There is often not a definite cause or reason for these pains. These pains may be caused by a type of germ (virus). The discomfort may also come from overuse. Overuse includes working out too hard when your body is not fit. Boney aches also come from weather changes. Bone is sensitive to atmospheric pressure changes. HOME CARE INSTRUCTIONS   Ask when your test results will be ready. Make sure you get your test results.  Only take over-the-counter or prescription medicines for pain, discomfort, or fever as directed by your caregiver. If you were given medications for your condition, do not drive, operate machinery or power tools, or sign legal documents for 24 hours. Do not drink alcohol. Do not take sleeping pills or other medications that may interfere with treatment.  Continue all activities unless the activities cause more pain. When the pain lessens, slowly resume normal activities. Gradually increase the intensity and duration of the activities or exercise.  During periods of severe pain, bed rest may be helpful. Lay or sit in any position that is comfortable.  Putting ice on the injured area.  Put ice in a bag.  Place a towel between your skin and the bag.  Leave the ice on for 15 to 20 minutes, 3 to 4 times a day.  Follow up with your caregiver for continued problems and no reason can be found for the pain. If the pain becomes worse or does not go away, it may be  necessary to repeat tests or do additional testing. Your caregiver may need to look further for a possible cause. SEEK IMMEDIATE MEDICAL CARE IF:  You have pain that is getting worse and is not relieved by medications.  You develop chest pain that is associated with shortness or breath, sweating, feeling sick to your stomach (nauseous), or throw up (vomit).  Your pain becomes localized to the abdomen.  You develop any new symptoms that seem different or that concern you. MAKE SURE YOU:   Understand these instructions.  Will watch your condition.  Will get help right away if you are not doing well or get worse. Document Released: 07/07/2005 Document Revised: 09/29/2011 Document Reviewed: 02/25/2008 [facility] Patient Information 2014 [place], [place].

## 2013-01-23 ENCOUNTER — Telehealth: Payer: Self-pay | Admitting: Internal Medicine

## 2013-01-23 NOTE — Telephone Encounter (Signed)
Called ms Sunde this morning to inform of rib xray results. No answer. Will try again on Monday. Did leave voicemail of nondisplaced rib fracture and pain control for now. Also discussed with on-call ortho attending for management of pain control at this time.

## 2013-01-24 DIAGNOSIS — S2232XA Fracture of one rib, left side, initial encounter for closed fracture: Secondary | ICD-10-CM | POA: Insufficient documentation
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
## 2013-01-25 ENCOUNTER — Telehealth: Payer: Self-pay | Admitting: Internal Medicine

## 2013-01-25 NOTE — Progress Notes (Signed)
Case discussed with Dr. Qureshi at the time of the visit.  We reviewed the resident's history and exam and pertinent patient test results.  I agree with the assessment, diagnosis, and plan of care documented in the resident's note. 
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
## 2013-02-10 ENCOUNTER — Encounter: Payer: Self-pay | Admitting: Internal Medicine

## 2013-02-19 ENCOUNTER — Emergency Department (HOSPITAL_COMMUNITY)
Admission: EM | Admit: 2013-02-19 | Discharge: 2013-02-19 | Disposition: A | Discharge status: Home / Self Care | Payer: Self-pay | Attending: Emergency Medicine | Admitting: Emergency Medicine

## 2013-02-19 ENCOUNTER — Encounter (HOSPITAL_COMMUNITY): Payer: Self-pay | Admitting: Emergency Medicine

## 2013-02-19 DIAGNOSIS — L0390 Cellulitis, unspecified: Secondary | ICD-10-CM

## 2013-02-19 DIAGNOSIS — Z8709 Personal history of other diseases of the respiratory system: Secondary | ICD-10-CM | POA: Insufficient documentation

## 2013-02-19 DIAGNOSIS — Y929 Unspecified place or not applicable: Secondary | ICD-10-CM | POA: Insufficient documentation

## 2013-02-19 DIAGNOSIS — F411 Generalized anxiety disorder: Secondary | ICD-10-CM | POA: Insufficient documentation

## 2013-02-19 DIAGNOSIS — L02419 Cutaneous abscess of limb, unspecified: Secondary | ICD-10-CM | POA: Insufficient documentation

## 2013-02-19 DIAGNOSIS — Z862 Personal history of diseases of the blood and blood-forming organs and certain disorders involving the immune mechanism: Secondary | ICD-10-CM | POA: Insufficient documentation

## 2013-02-19 DIAGNOSIS — Z8639 Personal history of other endocrine, nutritional and metabolic disease: Secondary | ICD-10-CM | POA: Insufficient documentation

## 2013-02-19 DIAGNOSIS — F329 Major depressive disorder, single episode, unspecified: Secondary | ICD-10-CM | POA: Insufficient documentation

## 2013-02-19 DIAGNOSIS — Y939 Activity, unspecified: Secondary | ICD-10-CM | POA: Insufficient documentation

## 2013-02-19 DIAGNOSIS — Z8739 Personal history of other diseases of the musculoskeletal system and connective tissue: Secondary | ICD-10-CM | POA: Insufficient documentation

## 2013-02-19 DIAGNOSIS — Z79899 Other long term (current) drug therapy: Secondary | ICD-10-CM | POA: Insufficient documentation

## 2013-02-19 DIAGNOSIS — F3289 Other specified depressive episodes: Secondary | ICD-10-CM | POA: Insufficient documentation

## 2013-02-19 MED ORDER — CEPHALEXIN 500 MG PO CAPS: 500.0000 mg | ORAL_CAPSULE | Freq: Four times a day (QID) | ORAL | Status: DC

## 2013-02-19 NOTE — ED Provider Notes (Signed)
CSN: [id number]     Arrival date & time 02/19/13  1546 History  This chart was scribed for non-physician practitioner Trixie Dredge, PA-C, working with Gerhard Munch, MD, by Yevette Edwards, ED Scribe. This patient was seen in room TR05C/TR05C and the patient's care was started at 4:07 PM.   First MD Initiated Contact with Patient 02/19/13 1600     Chief Complaint  Patient presents with  . Insect Bite    The history is provided by the patient. No language interpreter was used.   HPI Comments: Sheri [name] is a 62 y.o. female who presents to the Emergency Department complaining of a suspected insect bite to the right ankle. The pt believes she was bitten three days ago, and she has noticed swelling, redness, and pus-like drainage associated with the site. The redness and swelling superior to the bite improved after the white pus drained.  She reports the redness inferior to the "bite" site has been constant.  She also states the site itched mildly. The pt denies experiencing any fever, nausea, emesis, tingling or numbness. She also denies any pain associated with the bite except when it is being palpated. She has treated her symptoms by draining the suspected bite and soaking her foot in epsom salts and rubbing alcohol. She did not see any insect bite her or near her, was not aware of it when it happened.   Past Medical History  Diagnosis Date  . Anxiety   . Depression     treated at [facility]  . Poisoning by selective serotonin reuptake inhibitors(969.03) Jan 2007    severe reaction with MAOI (nardil) and dextromethorphan  . Goiter     hx of goiter  . Allergic rhinitis   . Chronic sinusitis   . DEGENERATIVE JOINT DISEASE, HANDS     several images revealing OA   Past Surgical History  Procedure Laterality Date  . Right mastoidectomy      ET tube placement in 1980 and 1990  . Thyroidectomy, partial      nodule removal - April 1996  . Right sinus surgery  october 2000  .  Ovarian cyst removal  march 1969    left ovarian cystectomy  . Appendectomy     Family History  Problem Relation Age of Onset  . Hypertension Mother   . Hypertension Father   . Psoriasis Father    History  Substance Use Topics  . Smoking status: Never Smoker   . Smokeless tobacco: Not on file  . Alcohol Use: No    No OB history provided.  Review of Systems  Constitutional: Negative for fever and chills.  Gastrointestinal: Negative for nausea and vomiting.  Musculoskeletal: Negative for myalgias.  Skin: Positive for color change (Redness) and wound (Insect bite).  Neurological: Negative for weakness and numbness.    Allergies  Guaifenesin  Home Medications   Current Outpatient Rx  Name  Route  Sig  Dispense  Refill  . albuterol (PROVENTIL HFA;VENTOLIN HFA) 108 (90 BASE) MCG/ACT inhaler   Inhalation   Inhale 2 puffs into the lungs every 6 (six) hours as needed for wheezing or shortness of breath.   1 Inhaler   0   . ALPRAZolam (XANAX) 0.5 MG tablet   Oral   Take 0.5 mg by mouth 3 (three) times daily as needed for anxiety.          . gabapentin (NEURONTIN) 300 MG capsule   Oral   Take 300 mg by mouth  3 (three) times daily.           Marland Kitchen ibuprofen (ADVIL,MOTRIN) 200 MG tablet   Oral   Take 400 mg by mouth every 6 (six) hours as needed for pain.         Marland Kitchen loratadine (CLARITIN) 10 MG tablet   Oral   Take 10 mg by mouth daily as needed for allergies.         . methocarbamol (ROBAXIN) 500 MG tablet   Oral   Take 1 tablet (500 mg total) by mouth 2 (two) times daily.   20 tablet   0   . propranolol (INDERAL) 20 MG tablet   Oral   Take 10 mg by mouth daily as needed (for palptiations).         . QUEtiapine (SEROQUEL) 25 MG tablet   Oral   Take 12.5 mg by mouth at bedtime.          Marland Kitchen venlafaxine (EFFEXOR-XR) 75 MG 24 hr capsule   Oral   Take 75 mg by mouth 2 (two) times daily.           Triage Vitals: BP 113/77  Pulse 106  Temp(Src) 97.7 F  (36.5 C) (Oral)  Resp 18  SpO2 96%  Physical Exam  Nursing note and vitals reviewed. Constitutional: She appears well-developed and well-nourished. No distress.  HENT:  Head: Normocephalic and atraumatic.  Neck: Neck supple.  Pulmonary/Chest: Effort normal.  Neurological: She is alert.  Skin: She is not diaphoretic.       ED Course   DIAGNOSTIC STUDIES: Oxygen Saturation is 96% on room air, normal by my interpretation.    COORDINATION OF CARE:  4:10 PM- Discussed treatment plan with patient which includes antibiotics, and the patient agreed to the plan. Also advised the pt to return to the ED if her symptoms worsen. The pt agreed.   Procedures (including critical care time)  Labs Reviewed - No data to display No results found. 1. Cellulitis     MDM  Pt with cellulitis to the right ankle and right foot.  Pt states the edema and erythema were worse prior to the "bite" draining.  The area now is flat and there is no induration or fluctuance.  Doubt current abscess though there was likely one there before and improvement is likely due to its drainage at home but nothing more to drain here today.  Pt to be d/c home with keflex, 2-3 day follow up with PCP for cellulitis recheck.  Discussed  findings, treatment, follow up with patient.  Pt given return precautions.  Pt verbalizes understanding and agrees with plan.     I personally performed the services described in this documentation, which was scribed in my presence. The recorded information has been reviewed and is accurate.    [place], PA-C 02/19/13 1708

## 2013-02-19 NOTE — ED Notes (Signed)
Patient presents with an insect bite to right ankle.  Patient states that she was bit about 3 days ago, noticed swelling, redness and drainage.  CMS intact, no drainage at this time area red and warm to touch.

## 2013-02-20 NOTE — ED Provider Notes (Signed)
Medical screening examination/treatment/procedure(s) were performed by non-physician practitioner and as supervising physician I was immediately available for consultation/collaboration.   Gwyneth Sprout, MD 02/20/13 1549

## 2013-03-15 ENCOUNTER — Emergency Department (HOSPITAL_COMMUNITY): Payer: Self-pay

## 2013-03-15 ENCOUNTER — Encounter (HOSPITAL_COMMUNITY): Payer: Self-pay | Admitting: Emergency Medicine

## 2013-03-15 ENCOUNTER — Inpatient Hospital Stay (HOSPITAL_COMMUNITY): Payer: Self-pay

## 2013-03-15 ENCOUNTER — Inpatient Hospital Stay (HOSPITAL_COMMUNITY)
Admission: EM | Admit: 2013-03-15 | Discharge: 2013-03-16 | DRG: 310 | Disposition: A | Discharge status: Home / Self Care | Payer: MEDICAID | Attending: Internal Medicine | Admitting: Internal Medicine

## 2013-03-15 DIAGNOSIS — Z8679 Personal history of other diseases of the circulatory system: Secondary | ICD-10-CM

## 2013-03-15 DIAGNOSIS — I4891 Unspecified atrial fibrillation: Principal | ICD-10-CM | POA: Diagnosis present

## 2013-03-15 DIAGNOSIS — I471 Supraventricular tachycardia, unspecified: Secondary | ICD-10-CM

## 2013-03-15 DIAGNOSIS — K219 Gastro-esophageal reflux disease without esophagitis: Secondary | ICD-10-CM | POA: Diagnosis present

## 2013-03-15 DIAGNOSIS — F411 Generalized anxiety disorder: Secondary | ICD-10-CM | POA: Diagnosis present

## 2013-03-15 DIAGNOSIS — I498 Other specified cardiac arrhythmias: Secondary | ICD-10-CM | POA: Diagnosis present

## 2013-03-15 DIAGNOSIS — F3289 Other specified depressive episodes: Secondary | ICD-10-CM | POA: Diagnosis present

## 2013-03-15 DIAGNOSIS — F329 Major depressive disorder, single episode, unspecified: Secondary | ICD-10-CM | POA: Diagnosis present

## 2013-03-15 DIAGNOSIS — Z79899 Other long term (current) drug therapy: Secondary | ICD-10-CM

## 2013-03-15 DIAGNOSIS — K8020 Calculus of gallbladder without cholecystitis without obstruction: Secondary | ICD-10-CM | POA: Diagnosis present

## 2013-03-15 DIAGNOSIS — I517 Cardiomegaly: Secondary | ICD-10-CM

## 2013-03-15 HISTORY — DX: Personal history of other diseases of the circulatory system: Z86.79

## 2013-03-15 HISTORY — DX: Supraventricular tachycardia: I47.1

## 2013-03-15 HISTORY — DX: Supraventricular tachycardia, unspecified: I47.10

## 2013-03-15 LAB — POCT I-STAT, CHEM 8
BUN: 19 mg/dL (ref 6–23)
Calcium, Ion: 1.29 mmol/L (ref 1.13–1.30)
Glucose, Bld: 185 mg/dL — ABNORMAL HIGH (ref 70–99)
TCO2: 26 mmol/L (ref 0–100)

## 2013-03-15 LAB — MAGNESIUM: Magnesium: 2.1 mg/dL (ref 1.5–2.5)

## 2013-03-15 LAB — PROTIME-INR
INR: 1.1 (ref 0.00–1.49)
Prothrombin Time: 14 seconds (ref 11.6–15.2)

## 2013-03-15 LAB — D-DIMER, QUANTITATIVE: D-Dimer, Quant: 1.36 ug/mL-FEU — ABNORMAL HIGH (ref 0.00–0.48)

## 2013-03-15 LAB — CBC WITH DIFFERENTIAL/PLATELET
Basophils Absolute: 0 10*3/uL (ref 0.0–0.1)
Eosinophils Absolute: 0.1 10*3/uL (ref 0.0–0.7)
Lymphocytes Relative: 19 % (ref 12–46)
Lymphs Abs: 2.3 10*3/uL (ref 0.7–4.0)
Neutrophils Relative %: 74 % (ref 43–77)
Platelets: 509 10*3/uL — ABNORMAL HIGH (ref 150–400)
RBC: 4.24 MIL/uL (ref 3.87–5.11)
WBC: 11.9 10*3/uL — ABNORMAL HIGH (ref 4.0–10.5)

## 2013-03-15 LAB — HEPARIN LEVEL (UNFRACTIONATED): Heparin Unfractionated: 0.38 IU/mL (ref 0.30–0.70)

## 2013-03-15 LAB — POCT I-STAT TROPONIN I

## 2013-03-15 LAB — TSH: TSH: 6.097 u[IU]/mL — ABNORMAL HIGH (ref 0.350–4.500)

## 2013-03-15 LAB — TROPONIN I: Troponin I: <0.3 ng/mL (ref <0.30)

## 2013-03-15 MED ORDER — LORAZEPAM 2 MG/ML IJ SOLN: 1.0000 mg | Freq: Four times a day (QID) | INTRAMUSCULAR | Status: DC | PRN

## 2013-03-15 MED ORDER — SODIUM CHLORIDE 0.9 % IV BOLUS (SEPSIS)
500.0000 mL | Freq: Once | INTRAVENOUS | Status: AC
  Administered 2013-03-15: 500 mL via INTRAVENOUS

## 2013-03-15 MED ORDER — VENLAFAXINE HCL ER 75 MG PO CP24
225.0000 mg | ORAL_CAPSULE | Freq: Every day | ORAL | Status: DC
  Administered 2013-03-15 – 2013-03-16 (×2): 225 mg via ORAL
  Filled 2013-03-15 (×3): qty 1

## 2013-03-15 MED ORDER — PROCAINAMIDE HCL 100 MG/ML IJ SOLN
2.0000 mg/min | INTRAVENOUS | Status: DC
  Administered 2013-03-15 – 2013-03-16 (×3): 2 mg/min via INTRAVENOUS
  Filled 2013-03-15 (×4): qty 10

## 2013-03-15 MED ORDER — DILTIAZEM HCL 100 MG IV SOLR: 5.0000 mg/h | INTRAVENOUS | Status: DC

## 2013-03-15 MED ORDER — PROCAINAMIDE HCL 100 MG/ML IJ SOLN
500.0000 mg | Freq: Once | INTRAVENOUS | Status: AC
  Administered 2013-03-15: 500 mg via INTRAVENOUS
  Filled 2013-03-15: qty 5

## 2013-03-15 MED ORDER — ACETAMINOPHEN 325 MG PO TABS: 650.0000 mg | ORAL_TABLET | ORAL | Status: DC | PRN

## 2013-03-15 MED ORDER — VENLAFAXINE HCL ER 75 MG PO CP24
75.0000 mg | ORAL_CAPSULE | Freq: Two times a day (BID) | ORAL | Status: DC
  Filled 2013-03-15 (×2): qty 1

## 2013-03-15 MED ORDER — ALPRAZOLAM 0.5 MG PO TABS
0.5000 mg | ORAL_TABLET | Freq: Three times a day (TID) | ORAL | Status: DC | PRN
  Administered 2013-03-15 – 2013-03-16 (×3): 0.5 mg via ORAL
  Filled 2013-03-15 (×3): qty 1

## 2013-03-15 MED ORDER — GABAPENTIN 300 MG PO CAPS
300.0000 mg | ORAL_CAPSULE | Freq: Three times a day (TID) | ORAL | Status: DC
  Administered 2013-03-15 – 2013-03-16 (×3): 300 mg via ORAL
  Filled 2013-03-15 (×5): qty 1

## 2013-03-15 MED ORDER — ASPIRIN 81 MG PO CHEW
324.0000 mg | CHEWABLE_TABLET | Freq: Once | ORAL | Status: AC
  Administered 2013-03-15: 324 mg via ORAL
  Filled 2013-03-15: qty 4

## 2013-03-15 MED ORDER — HEPARIN BOLUS VIA INFUSION
3000.0000 [IU] | Freq: Once | INTRAVENOUS | Status: AC
  Administered 2013-03-15: 3000 [IU] via INTRAVENOUS

## 2013-03-15 MED ORDER — ONDANSETRON HCL 4 MG/2ML IJ SOLN: 4.0000 mg | Freq: Four times a day (QID) | INTRAMUSCULAR | Status: DC | PRN

## 2013-03-15 MED ORDER — DILTIAZEM HCL 25 MG/5ML IV SOLN
10.0000 mg | Freq: Once | INTRAVENOUS | Status: DC
  Filled 2013-03-15: qty 5

## 2013-03-15 MED ORDER — ASPIRIN 81 MG PO CHEW: 324.0000 mg | CHEWABLE_TABLET | Freq: Once | ORAL | Status: DC

## 2013-03-15 MED ORDER — HEPARIN (PORCINE) IN NACL 100-0.45 UNIT/ML-% IJ SOLN
900.0000 [IU]/h | INTRAMUSCULAR | Status: DC
  Administered 2013-03-15 – 2013-03-16 (×2): 900 [IU]/h via INTRAVENOUS
  Filled 2013-03-15 (×2): qty 250

## 2013-03-15 MED ORDER — ALBUTEROL SULFATE HFA 108 (90 BASE) MCG/ACT IN AERS: 2.0000 | INHALATION_SPRAY | Freq: Four times a day (QID) | RESPIRATORY_TRACT | Status: DC | PRN

## 2013-03-15 MED ORDER — SODIUM CHLORIDE 0.9 % IV SOLN
INTRAVENOUS | Status: DC
  Administered 2013-03-15 – 2013-03-16 (×3): via INTRAVENOUS

## 2013-03-15 MED ORDER — QUETIAPINE 12.5 MG HALF TABLET
12.5000 mg | ORAL_TABLET | Freq: Every day | ORAL | Status: DC
  Administered 2013-03-15: 12.5 mg via ORAL
  Filled 2013-03-15 (×2): qty 1

## 2013-03-15 MED ORDER — TRAZODONE HCL 100 MG PO TABS
100.0000 mg | ORAL_TABLET | Freq: Every day | ORAL | Status: DC
  Administered 2013-03-15: 100 mg via ORAL
  Filled 2013-03-15 (×2): qty 1

## 2013-03-15 MED ORDER — DEXTROSE 5 % IV SOLN
500.0000 mg | Freq: Once | INTRAVENOUS | Status: AC
  Administered 2013-03-15: 500 mg via INTRAVENOUS
  Filled 2013-03-15: qty 5

## 2013-03-15 MED ORDER — IOHEXOL 350 MG/ML SOLN
100.0000 mL | Freq: Once | INTRAVENOUS | Status: AC | PRN
  Administered 2013-03-15: 100 mL via INTRAVENOUS

## 2013-03-15 NOTE — Progress Notes (Signed)
ANTICOAGULATION CONSULT NOTE - Follow Up Consult  Pharmacy Consult for heparin Indication: chest pain/ACS  Allergies  Allergen Reactions  . Guaifenesin Palpitations    Patient Measurements: Height: 5\' 5"  (165.1 cm) Weight: 144 lb 13.5 oz (65.7 kg) IBW/kg (Calculated) : 57  Vital Signs: Temp: 97.8 F (36.6 C) (08/26 1127) Temp src: Oral (08/26 1127) BP: 112/72 mmHg (08/26 1500) Pulse Rate: 76 (08/26 1500)  Labs:  Recent Labs  03/15/13 0454 03/15/13 0558 03/15/13 1105 03/15/13 1458  HGB 12.7 13.9  --   --   HCT 37.7 41.0  --   --   PLT 509*  --   --   --   LABPROT 14.0  --   --   --   INR 1.10  --   --   --   HEPARINUNFRC  --   --   --  0.38  CREATININE  --  0.70  --   --   TROPONINI  --   --  <0.30  --     Estimated Creatinine Clearance: 65.6 ml/min (by C-G formula based on Cr of 0.7).   Medications:  Scheduled:  . gabapentin  300 mg Oral TID  . QUEtiapine  12.5 mg Oral QHS  . traZODone  100 mg Oral QHS  . venlafaxine XR  225 mg Oral Q breakfast   Infusions:  . sodium chloride 100 mL/hr at 03/15/13 0728  . heparin 900 Units/hr (03/15/13 0740)  . procainamide (PRONESTYL) 4 MG/ML INFUSION 2 mg/min (03/15/13 0800)    Assessment: 62 yo female here with afib on heparin at 900 units/hr and at goal (HL= 0.38).   Goal of Therapy:  Heparin level 0.3-0.7 units/ml Monitor platelets by anticoagulation protocol: Yes   Plan:  -No heparin changes needed -Will recheck a heparin level and CBC in am   Harland German, Pharm D 03/15/2013 3:45 PM

## 2013-03-15 NOTE — Progress Notes (Signed)
ANTICOAGULATION CONSULT NOTE - Initial Consult  Pharmacy Consult for Heparin Indication: chest pain/ACS  Allergies  Allergen Reactions  . Guaifenesin Palpitations    Patient Measurements: Height: 5' 2.6" (159 cm) Weight: 141 lb 1.5 oz (64 kg) IBW/kg (Calculated) : 51.48  Vital Signs: Temp: 98.2 F (36.8 C) (08/26 0510) Temp src: Oral (08/26 0510) BP: 93/60 mmHg (08/26 0510) Pulse Rate: 163 (08/26 0510)  Labs:  Recent Labs  03/15/13 0454  HGB 12.7  HCT 37.7  PLT 509*  LABPROT 14.0  INR 1.10    Estimated Creatinine Clearance: 65 ml/min (by C-G formula based on Cr of 0.75).   Medical History: Past Medical History  Diagnosis Date  . Anxiety   . Depression     treated at [facility]  . Poisoning by selective serotonin reuptake inhibitors(969.03) Jan 2007    severe reaction with MAOI (nardil) and dextromethorphan  . Goiter     hx of goiter  . Allergic rhinitis   . Chronic sinusitis   . DEGENERATIVE JOINT DISEASE, HANDS     several images revealing OA    Medications:  APAP  Albuterol  Xanax  Neurontin  Claritin  Inderal  Seroquel  Trazodone  Effexor-XR  Assessment: 62 yo female with Afib for heparin  Goal of Therapy:  Heparin level 0.3-0.7 units/ml Monitor platelets by anticoagulation protocol: Yes   Plan:  Heparin 3000 units IV bolus, then 900 units/hr Check heparin level in 6 hours.  Brysyn Brandenberger, Gary Fleet 03/15/2013,5:50 AM

## 2013-03-15 NOTE — Consult Note (Signed)
Reason for Consult: PAF with previous hx of PSVT   Referring Physician:  Dr. Mullen WUJ:WJXBJY, Sheri Lis, Sheri [name] Primary Cardiologist:NEW  Sheri [name] is an 62 y.o. female.    Chief Complaint: tachycardia for hours, not sure she has ever had atrial fib.   HPI: 23 year presented to ER this AM reporting that her symptoms started yesterday around 430pm and was associated with some difficulty with breathing. She attempted an inhaler and her propranolol X 3 (she takes intermittently for fast heart rate, she took a total of 60mg ). She further tried valsalva maneuver. She did not have success getting her heart rate down. She presented to [facility] when the issue began, however, they gave her medicine and did an EKG and sent her home. When the issue persisted, she presented to [facility] after taking 20 mg more of propranolol . She reported a history of palpitations for which she takes prn propranolol (20mg ) (noted as supposed to be taking 1/2 pill - 10mg  in chart BID had not taken regularly for 2-3 months and this was her 3rd episode of tachycardia) and attempts valsalva.  These started around the time she was 62 years old.  Dr. Lovell Sheehan was her cardiologist.  She has had to be evaluated in the hospital in the past for these issues but is occasionally able to control them at home. She has had increased social stressors recently as her niece died. Further associated symptom was diarrhea while she was trying to valsalva, this is normal for her with the tachycardia. She has a history of thyroid surgery, she is not treated with synthroid. She further denies a history of DVT, heart disease or any other medical problems. She is treated for anxiety and panic attacks with seroquel, trazadone, effexor and xanax. She has been on stable doses of these medications by report.   In Er she was in A fib with RVR she was placed on procainamide drip.  She then converted to SR.  She had echo done earlier not yet read.   EKG SR  Rate of 74 without acute changes.  Her TSH is elevated.   CT of her chest for elevated d dimer was negative for PE she did have mild abd ascites.   Incompletely visualized gallbladder is abnormal, likely reflecting noncalcified gallstones (series 4/image 155).  Negative MI.  Maybe mild chest discomfort but no significant discomfort.       Past Medical History  Diagnosis Date  . Anxiety   . Depression     treated at [facility]  . Poisoning by selective serotonin reuptake inhibitors(969.03) Jan 2007    severe reaction with MAOI (nardil) and dextromethorphan  . Goiter     hx of goiter  . Allergic rhinitis   . Chronic sinusitis   . DEGENERATIVE JOINT DISEASE, HANDS     several images revealing OA    Past Surgical History  Procedure Laterality Date  . Right mastoidectomy      ET tube placement in 1980 and 1990  . Thyroidectomy, partial      nodule removal - April 1996  . Right sinus surgery  october 2000  . Ovarian cyst removal  march 1969    left ovarian cystectomy  . Appendectomy      Family History  Problem Relation Age of Onset  . Hypertension Mother   . Hypertension Father   . Psoriasis Father    Social History:  reports that she has never smoked. She  does not have any smokeless tobacco history on file. She reports that she does not drink alcohol or use illicit drugs.  Allergies:  Allergies  Allergen Reactions  . Guaifenesin Palpitations    Medications Prior to Admission  Medication Sig Dispense Refill  . acetaminophen (TYLENOL) 325 MG tablet Take 650 mg by mouth every 6 (six) hours as needed for pain.      Marland Kitchen albuterol (PROVENTIL HFA;VENTOLIN HFA) 108 (90 BASE) MCG/ACT inhaler Inhale 2 puffs into the lungs every 6 (six) hours as needed for wheezing or shortness of breath.  1 Inhaler  0  . ALPRAZolam (XANAX) 0.5 MG tablet Take 0.5 mg by mouth 3 (three) times daily as needed for anxiety.       . gabapentin (NEURONTIN) 300 MG capsule Take 300 mg by  mouth 3 (three) times daily.        Marland Kitchen loratadine (CLARITIN) 10 MG tablet Take 10 mg by mouth daily as needed for allergies.      Marland Kitchen propranolol (INDERAL) 20 MG tablet Take 10 mg by mouth daily as needed (for palptiations).      . QUEtiapine (SEROQUEL) 25 MG tablet Take 12.5 mg by mouth at bedtime.       . traZODone (DESYREL) 100 MG tablet Take 100 mg by mouth at bedtime.      Marland Kitchen venlafaxine XR (EFFEXOR-XR) 75 MG 24 hr capsule Take 225 mg by mouth daily.      . [DISCONTINUED] venlafaxine (EFFEXOR-XR) 75 MG 24 hr capsule Take 75 mg by mouth 2 (two) times daily.         Results for orders placed during the hospital encounter of 03/15/13 (from the past 48 hour(s))  CBC WITH DIFFERENTIAL     Status: Abnormal   Collection Time    03/15/13  4:54 AM      Result Value Range   WBC 11.9 (*) 4.0 - 10.5 K/uL   RBC 4.24  3.87 - 5.11 MIL/uL   Hemoglobin 12.7  12.0 - 15.0 g/dL   HCT 16.1  09.6 - 04.5 %   MCV 88.9  78.0 - 100.0 fL   MCH 30.0  26.0 - 34.0 pg   MCHC 33.7  30.0 - 36.0 g/dL   RDW 40.9  81.1 - 91.4 %   Platelets 509 (*) 150 - 400 K/uL   Neutrophils Relative % 74  43 - 77 %   Neutro Abs 8.8 (*) 1.7 - 7.7 K/uL   Lymphocytes Relative 19  12 - 46 %   Lymphs Abs 2.3  0.7 - 4.0 K/uL   Monocytes Relative 6  3 - 12 %   Monocytes Absolute 0.8  0.1 - 1.0 K/uL   Eosinophils Relative 0  0 - 5 %   Eosinophils Absolute 0.1  0.0 - 0.7 K/uL   Basophils Relative 0  0 - 1 %   Basophils Absolute 0.0  0.0 - 0.1 K/uL  PROTIME-INR     Status: None   Collection Time    03/15/13  4:54 AM      Result Value Range   Prothrombin Time 14.0  11.6 - 15.2 seconds   INR 1.10  0.00 - 1.49  D-DIMER, QUANTITATIVE     Status: Abnormal   Collection Time    03/15/13  4:54 AM      Result Value Range   D-Dimer, Quant 1.36 (*) 0.00 - 0.48 ug/mL-FEU   Comment:            AT  THE INHOUSE ESTABLISHED CUTOFF     VALUE OF 0.48 ug/mL FEU,     THIS ASSAY HAS BEEN DOCUMENTED     IN THE LITERATURE TO HAVE     A SENSITIVITY  AND NEGATIVE     PREDICTIVE VALUE OF AT LEAST     98 TO 99%.  THE TEST RESULT     SHOULD BE CORRELATED WITH     AN ASSESSMENT OF THE CLINICAL     PROBABILITY OF DVT / VTE.  POCT I-STAT TROPONIN I     Status: None   Collection Time    03/15/13  5:13 AM      Result Value Range   Troponin i, poc 0.07  0.00 - 0.08 ng/mL   Comment 3            Comment: Due to the release kinetics of cTnI,     a negative result within the first hours     of the onset of symptoms does not rule out     myocardial infarction with certainty.     If myocardial infarction is still suspected,     repeat the test at appropriate intervals.  POCT I-STAT, CHEM 8     Status: Abnormal   Collection Time    03/15/13  5:58 AM      Result Value Range   Sodium 141  135 - 145 mEq/L   Potassium 4.2  3.5 - 5.1 mEq/L   Chloride 105  96 - 112 mEq/L   BUN 19  6 - 23 mg/dL   Creatinine, Ser 1.61  0.50 - 1.10 mg/dL   Glucose, Bld 096 (*) 70 - 99 mg/dL   Calcium, Ion 0.45  4.09 - 1.30 mmol/L   TCO2 26  0 - 100 mmol/L   Hemoglobin 13.9  12.0 - 15.0 g/dL   HCT 81.1  91.4 - 78.2 %  TSH     Status: Abnormal   Collection Time    03/15/13  6:48 AM      Result Value Range   TSH 6.097 (*) 0.350 - 4.500 uIU/mL   Comment: Performed at [facility]  T4, FREE     Status: None   Collection Time    03/15/13  6:48 AM      Result Value Range   Free T4 0.88  0.80 - 1.80 ng/dL   Comment: Performed at [facility]  PRO B NATRIURETIC PEPTIDE     Status: Abnormal   Collection Time    03/15/13  6:51 AM      Result Value Range   Pro B Natriuretic peptide (BNP) 2917.0 (*) 0 - 125 pg/mL  MAGNESIUM     Status: None   Collection Time    03/15/13  7:56 AM      Result Value Range   Magnesium 2.1  1.5 - 2.5 mg/dL  MRSA PCR SCREENING     Status: None   Collection Time    03/15/13  8:15 AM      Result Value Range   MRSA by PCR NEGATIVE  NEGATIVE   Comment:            The GeneXpert MRSA Assay (FDA     approved for NASAL  specimens     only), is one component of a     comprehensive MRSA colonization     surveillance program. It is not     intended to diagnose MRSA     infection nor to  guide or     monitor treatment for     MRSA infections.  TROPONIN I     Status: None   Collection Time    03/15/13 11:05 AM      Result Value Range   Troponin I <0.30  <0.30 ng/mL   Comment:            Due to the release kinetics of cTnI,     a negative result within the first hours     of the onset of symptoms does not rule out     myocardial infarction with certainty.     If myocardial infarction is still suspected,     repeat the test at appropriate intervals.  HEPARIN LEVEL (UNFRACTIONATED)     Status: None   Collection Time    03/15/13  2:58 PM      Result Value Range   Heparin Unfractionated 0.38  0.30 - 0.70 IU/mL   Comment:            IF HEPARIN RESULTS ARE BELOW     EXPECTED VALUES, AND PATIENT     DOSAGE HAS BEEN CONFIRMED,     SUGGEST FOLLOW UP TESTING     OF ANTITHROMBIN III LEVELS.   Ct Angio Chest Pe W/cm &/or Wo Cm  03/15/2013   *RADIOLOGY REPORT*  Clinical Data: SOB, new onset atrial fibrillation, elevated D-dimer  CT ANGIOGRAPHY CHEST  Technique:  Multidetector CT imaging of the chest using the standard protocol during bolus administration of intravenous contrast. Multiplanar reconstructed images including MIPs were obtained and reviewed to evaluate the vascular anatomy.  Contrast: OMNIPAQUE IOHEXOL 350 MG/ML SOLN  Comparison: Chest radiograph dated 03/15/2013  Findings: No evidence of pulmonary embolism.  Dependent atelectasis in the bilateral lower lobes.  Mild mosaic attenuation.  Trace bilateral pleural effusions.  No pneumothorax.  Left thyroid is mildly nodular (series 4/image 15).  Mild cardiomegaly.  Trace pericardial fluid.  Ectasia of the ascending thoracic aorta measuring up to 4.0 cm.  No suspicious mediastinal, hilar, or axillary lymphadenopathy.  Visualized upper abdomen is notable  for stranding/fluid in the periportal region and trace perihepatic/perisplenic ascites. Incompletely visualized gallbladder is abnormal, likely reflecting noncalcified gallstones (series 4/image 155).  Mild degenerative changes of the visualized thoracolumbar spine. Mild sclerosis involving the upper sternum (sagittal image 59), nonspecific.  IMPRESSION: No evidence of pulmonary embolism.  Trace bilateral pleural effusions.  Small volume upper abdominal ascites with periportal stranding/fluid and possible noncalcified gallstones.  Correlate with LFTs and consider right upper quadrant ultrasound for further evaluation.   Original Report Authenticated By: Charline Bills, M.D.   Dg Chest Port 1 View  03/15/2013   *RADIOLOGY REPORT*  Clinical Data: Shortness of breath  PORTABLE CHEST - 1 VIEW  Comparison: 01/02/2013  Findings: Hyperinflation with apical scarring and interstitial coarsening.  No confluent airspace opacity.  Heart size upper normal.  Mild aortic tortuosity without dilatation.  No appreciable pneumothorax or pleural effusion.  No acute osseous finding.  IMPRESSION: Chronic changes as above.  No acute process identified.   Original Report Authenticated By: Jearld Lesch, M.D.    ROS: General:no colds or fevers, no weight changes Skin:no rashes or ulcers HEENT:no blurred vision, no congestion CV:see HPI PUL:see HPI GI:no diarrhea,  Except freq stools with tachycardia constipation or melena, no indigestion GU:no hematuria, no dysuria MS:no joint pain, no claudication Neuro:no syncope, no lightheadedness Endo:no diabetes, + thyroid disease   Blood pressure 112/72, pulse 76, temperature 98.3 F (36.8  C), temperature source Oral, resp. rate 17, height 5\' 5"  (1.651 m), weight 144 lb 13.5 oz (65.7 kg), SpO2 95.00%. PE: General:Pleasant affect, NAD Skin:Warm and dry, brisk capillary refill HEENT:normocephalic, sclera clear, mucus membranes moist Neck:supple, no JVD, no bruits  Heart:S1S2  RRR without murmur, gallup, rub or click Lungs:clear, diminished without rales, rhonchi, or wheezes UJW:JXBJ, non tender, + BS, do not palpate liver spleen or masses Ext:no lower ext edema, 1+ pedal pulses, 2+ radial pulses Neuro:alert and oriented, MAE, follows commands, + facial symmetry    Assessment/Plan Principal Problem:   Atrial fibrillation with RVR Active Problems:   ANXIETY DISORDER, GENERALIZED   GERD   Hx of atrial tachycardia since age 57  PLAN: awaiting echo results.  Still on pronestyl drip.  ? Return to propranolol vs other BB.  ? Stress test as outpt depending on Echo results.  Anticoagulation depending on echo results.   Sheri [name]  Nurse Practitioner Certified [facility] Pager [phone] 03/15/2013, 4:37 PM     Agree with note written by Sheri [name]  PAF for the last 30 years. Sheri [name] was her Cardiologist". No other medical prob. Occurs 1-2 times /year, sometimes she goes years w/o episodes. EKG was C/W this. She was put on IV Pronestyl this AM and broke to SR. Currently w/o symptoms. Exam benign, 2D pending. No indication for systemic A/C. Prob home in AM.  Sheri [name] 03/15/2013 6:14 PM

## 2013-03-15 NOTE — Care Management Note (Signed)
    Page 1 of 1   03/15/2013     10:33:21 AM   CARE MANAGEMENT NOTE 03/15/2013  Patient:  Sheri [name], Sheri [name]   Account Number:  [id number]  Date Initiated:  03/15/2013  Documentation initiated by:  Junius Creamer  Subjective/Objective Assessment:   adm w at fib w rvr     Action/Plan:   lives alone, pcp dr Stacy Gardner   Anticipated DC Date:     Anticipated DC Plan:        DC Planning Services  CM consult      Choice offered to / List presented to:             Status of service:   Medicare Important Message given?   (If response is "NO", the following Medicare IM given date fields will be blank) Date Medicare IM given:   Date Additional Medicare IM given:    Discharge Disposition:    Per UR Regulation:  Reviewed for med. necessity/level of care/duration of stay  If discussed at Long Length of Stay Meetings, dates discussed:    Comments:

## 2013-03-15 NOTE — ED Provider Notes (Signed)
CSN: [id number]     Arrival date & time 03/15/13  4098 History   First MD Initiated Contact with Patient 03/15/13 [phone]     Chief Complaint  Patient presents with  . Palpitations   (Consider location/radiation/quality/duration/timing/severity/associated sxs/prior Treatment) Patient is a 63 y.o. female presenting with palpitations. The history is provided by the patient.  Palpitations Palpitations quality:  Irregular (and fast as is only taking her rate agents PRN ) Onset quality:  Sudden Timing:  Constant Progression:  Unchanged Chronicity:  Recurrent Context: not appetite suppressants   Relieved by:  Nothing Worsened by:  Nothing tried Ineffective treatments:  None tried Associated symptoms: chest pain, chest pressure and shortness of breath   Risk factors: stress   Risk factors: no hx of DVT     Past Medical History  Diagnosis Date  . Anxiety   . Depression     treated at [facility]  . Poisoning by selective serotonin reuptake inhibitors(969.03) Jan 2007    severe reaction with MAOI (nardil) and dextromethorphan  . Goiter     hx of goiter  . Allergic rhinitis   . Chronic sinusitis   . DEGENERATIVE JOINT DISEASE, HANDS     several images revealing OA   Past Surgical History  Procedure Laterality Date  . Right mastoidectomy      ET tube placement in 1980 and 1990  . Thyroidectomy, partial      nodule removal - Greysyn Vanderberg 1996  . Right sinus surgery  october 2000  . Ovarian cyst removal  march 1969    left ovarian cystectomy  . Appendectomy     Family History  Problem Relation Age of Onset  . Hypertension Mother   . Hypertension Father   . Psoriasis Father    History  Substance Use Topics  . Smoking status: Never Smoker   . Smokeless tobacco: Not on file  . Alcohol Use: No   OB History   Grav Para Term Preterm Abortions TAB SAB Ect Mult Living                 Review of Systems  HENT: Positive for neck pain.   Respiratory: Positive for shortness of  breath.   Cardiovascular: Positive for chest pain and palpitations.  All other systems reviewed and are negative.    Allergies  Guaifenesin  Home Medications   Current Outpatient Rx  Name  Route  Sig  Dispense  Refill  . acetaminophen (TYLENOL) 325 MG tablet   Oral   Take 650 mg by mouth every 6 (six) hours as needed for pain.         Marland Kitchen albuterol (PROVENTIL HFA;VENTOLIN HFA) 108 (90 BASE) MCG/ACT inhaler   Inhalation   Inhale 2 puffs into the lungs every 6 (six) hours as needed for wheezing or shortness of breath.   1 Inhaler   0   . ALPRAZolam (XANAX) 0.5 MG tablet   Oral   Take 0.5 mg by mouth 3 (three) times daily as needed for anxiety.          . gabapentin (NEURONTIN) 300 MG capsule   Oral   Take 300 mg by mouth 3 (three) times daily.           Marland Kitchen loratadine (CLARITIN) 10 MG tablet   Oral   Take 10 mg by mouth daily as needed for allergies.         Marland Kitchen propranolol (INDERAL) 20 MG tablet   Oral   Take 10  mg by mouth daily as needed (for palptiations).         . QUEtiapine (SEROQUEL) 25 MG tablet   Oral   Take 12.5 mg by mouth at bedtime.          . traZODone (DESYREL) 100 MG tablet   Oral   Take 100 mg by mouth at bedtime.         Marland Kitchen venlafaxine (EFFEXOR-XR) 75 MG 24 hr capsule   Oral   Take 75 mg by mouth 2 (two) times daily.           BP 93/60  Pulse 163  Temp(Src) 98.2 F (36.8 C) (Oral)  Resp 21  SpO2 99% Physical Exam  Constitutional: She is oriented to person, place, and time. She appears well-developed and well-nourished. No distress.  HENT:  Head: Normocephalic and atraumatic.  Mouth/Throat: Oropharynx is clear and moist.  Eyes: Conjunctivae are normal. Pupils are equal, round, and reactive to light.  Neck: Normal range of motion.  Cardiovascular: Intact distal pulses.  An irregularly irregular rhythm present. Tachycardia present.   Pulmonary/Chest: Effort normal and breath sounds normal. She has no wheezes. She has no rales.   Abdominal: Soft. Bowel sounds are normal. There is no tenderness. There is no rebound and no guarding.  Musculoskeletal: Normal range of motion. She exhibits no edema.  Neurological: She is alert and oriented to person, place, and time.  Skin: Skin is warm and dry.  Psychiatric: She has a normal mood and affect.    ED Course  Procedures (including critical care time) Labs Review Labs Reviewed  CBC WITH DIFFERENTIAL - Abnormal; Notable for the following:    WBC 11.9 (*)    Platelets 509 (*)    Neutro Abs 8.8 (*)    All other components within normal limits  PROTIME-INR  POCT I-STAT TROPONIN I   Imaging Review No results found.  MDM   1. Atrial fibrillation with RVR     Date: 03/15/2013  Rate: 157  Rhythm: atrial fibrillation  QRS Axis: normal  Intervals: normal  ST/T Wave abnormalities: nonspecific ST changes  Conduction Disutrbances:none  Narrative Interpretation:   Old EKG Reviewed: none available  .MDM Reviewed: nursing note and vitals Interpretation: labs, ECG and x-ray Total time providing critical care: > 105 minutes. This excludes time spent performing separately reportable procedures and services. Consults: admitting MD   Results for orders placed during the hospital encounter of 03/15/13  CBC WITH DIFFERENTIAL      Result Value Range   WBC 11.9 (*) 4.0 - 10.5 K/uL   RBC 4.24  3.87 - 5.11 MIL/uL   Hemoglobin 12.7  12.0 - 15.0 g/dL   HCT 16.1  09.6 - 04.5 %   MCV 88.9  78.0 - 100.0 fL   MCH 30.0  26.0 - 34.0 pg   MCHC 33.7  30.0 - 36.0 g/dL   RDW 40.9  81.1 - 91.4 %   Platelets 509 (*) 150 - 400 K/uL   Neutrophils Relative % 74  43 - 77 %   Neutro Abs 8.8 (*) 1.7 - 7.7 K/uL   Lymphocytes Relative 19  12 - 46 %   Lymphs Abs 2.3  0.7 - 4.0 K/uL   Monocytes Relative 6  3 - 12 %   Monocytes Absolute 0.8  0.1 - 1.0 K/uL   Eosinophils Relative 0  0 - 5 %   Eosinophils Absolute 0.1  0.0 - 0.7 K/uL   Basophils Relative 0  0 -  1 %   Basophils Absolute  0.0  0.0 - 0.1 K/uL  PROTIME-INR      Result Value Range   Prothrombin Time 14.0  11.6 - 15.2 seconds   INR 1.10  0.00 - 1.49  POCT I-STAT TROPONIN I      Result Value Range   Troponin i, poc 0.07  0.00 - 0.08 ng/mL   Comment 3            Dg Chest Port 1 View  03/15/2013   *RADIOLOGY REPORT*  Clinical Data: Shortness of breath  PORTABLE CHEST - 1 VIEW  Comparison: 01/02/2013  Findings: Hyperinflation with apical scarring and interstitial coarsening.  No confluent airspace opacity.  Heart size upper normal.  Mild aortic tortuosity without dilatation.  No appreciable pneumothorax or pleural effusion.  No acute osseous finding.  IMPRESSION: Chronic changes as above.  No acute process identified.   Original Report Authenticated By: Jearld Lesch, M.D.    Medications  procainamide (PRONESTYL) 500 mg in dextrose 5 % 125 mL IVPB (not administered)  procainamide (PRONESTYL) 1,000 mg in dextrose 5 % 250 mL infusion (not administered)  sodium chloride 0.9 % bolus 500 mL (not administered)  aspirin chewable tablet 324 mg (not administered)  sodium chloride 0.9 % bolus 500 mL (0 mLs Intravenous Stopped 03/15/13 0542)  aspirin chewable tablet 324 mg (324 mg Oral Given 03/15/13 0504)     CRITICAL CARE Performed by: Jasmine Awe Total critical care time: 120 minutes Critical care time was exclusive of separately billable procedures and treating other patients. Critical care was necessary to treat or prevent imminent or life-threatening deterioration. Critical care was time spent personally by me on the following activities: development of treatment plan with patient and/or surrogate as well as nursing, discussions with consultants, evaluation of patient's response to treatment, examination of patient, obtaining history from patient or surrogate, ordering and performing treatments and interventions, ordering and review of laboratory studies, ordering and review of radiographic studies, pulse  oximetry and re-evaluation of patient's condition.   Jasmine Awe, MD 03/15/13 [phone]

## 2013-03-15 NOTE — H&P (Signed)
IM ATTENDING H&P   Date: 03/15/2013  Patient name: [name]  Medical record number: [id number]  Date of birth: 11-Mar-1951   This patient has been seen and the plan of care was discussed with the house staff. Please see their note for complete details. I concur with their findings with the following additions/corrections:  I saw Ms. Torain and confirmed the pertinent history and physical findings.  Ms. Decarlo reports that her symptoms started yesterday around 430pm and was associated with some difficulty with breathing.  She attempted an inhaler and her propranolol X 3 (she takes intermittently for fast heart rate, she took a total of 60mg ).  She further tried valsalva maneuver.  She did not have success getting her heart rate down.  She presented to [facility] when the issue began, however, they gave her medicine and did an EKG and sent her home.  When the issue persisted, she presented to [facility].  She reports a history of palpitations for which she takes prn propranolol (20mg ) (noted as supposed to be taking 1/2 pill and 10mg  in chart BID) and attempts valsalva.  She has had to be evaluated in the hospital in the past for these issues but is occasionally able to control them at home.  She has had increased social stressors recently as her niece died.  Further associated symptom was diarrhea while she was trying to valsalva.  She has a history of thyroid surgery, she is not treated with synthroid.  She further denies a history of DVT, heart disease or any other medical problems.  She is treated for anxiety and panic attacks with seroquel, trazadone, effexor and xanax.  She has been on stable doses of these medications by report.    In the ED, she was found to have a HR in the 150s to 160s and be in atrial fibrillation.  She was given aspirin, fluids and placed on heparin and procainamide drips.  By the time I saw her she had converted to sinus rhythm.  Cardiology has been consulted and has not yet  seen the patient.    She will need a TTE and consideration for anticoagulation given her atrial fibrillation, she will also need to be transitioned to an oral regimen for rate or rhythm control.  As she tolerates propranolol PRN, she may do best with rate control and a beta blocker vs. A medication like amiodarone.  As she has converted to sinus rhythm, she will not require a cardioversion at this time.  We have ordered a TSH and free T4.  Her BNP is elevated.  Her D dimer is elevated, however, she has no LE swelling and her SOB was in the setting of her palpitations.  Her well's score is 1.5 due to her heart rate, which puts her at low risk for PE.  Will follow up cardiology recommendations today.   Inez Catalina, MD 03/15/2013, 1:57 PM

## 2013-03-15 NOTE — Progress Notes (Signed)
Echocardiogram 2D Echocardiogram has been performed.  Sheri [name] 03/15/2013, 3:06 PM

## 2013-03-15 NOTE — ED Notes (Signed)
PT. REPORTS PALPITATIONS , NECK PAIN AND SOB ONSET YESTERDAY , DEATH IN FAMILY ( NIECE)  YESTERDAY / LOTS OF STRESS. DENIES CHEST PAIN .

## 2013-03-15 NOTE — ED Notes (Signed)
Pt AOx4, pt mentating properly, denies dizziness, SOB, CP.

## 2013-03-15 NOTE — H&P (Signed)
Date: 03/15/2013               Patient Name:  Sheri [name] MRN: [id number]  DOB: [birth year] Age / Sex: 62 y.o., female   PCP: Belia Heman, MD         Medical Service: Internal Medicine Teaching Service         Attending Physician: Dr. Morene Antu Smitty Cords, MD    First Contact: Dr. Angelina Sheriff Pager: [phone]  Second Contact: Dr. Dede Query Pager: [phone]       After Hours (After 5p/  First Contact Pager: [phone]  weekends / holidays): Second Contact Pager: [phone]   Chief Complaint: Palpitations  History of Present Illness:  [name] is a 62 y.o. female with PMH of anxiety, depression, and DJD/OA who presents today with palpitations.  Onset was sudden yesterday at 4:30pm. It is associated with some SOB and wheezing. She has an albuterol inhaler at home, but has not used it because it typically worsens her tachycardia. Denies chest pain, nausea, vomiting, diarrhea, abdominal pain, leg swelling, orthopnea, PND, rash, hair or skin changes, numbness, weakness, visual changes. She has been under a lot of stress lately as her adult niece died of terminal cancer on 12/25/2022. She remarks that she "did CPR on her" and it was very traumatic. Denies caffeine, tobacco, alcohol, drug use. No new medications or supplements. She went to an OSH [facility]) yesterday when the issue began, and had a work-up that included blood work and an EKG. Apparently they said nothing was wrong and sent her home.She returns today because she is still experiencing symptoms.  She says she has a history of heart palpitations since she was 16. Apparently she used to see a cardiologist for this. She does not think it was atrial fibrillation. One of the things she was taught to do for her palpitations was go to the bathroom and perform a valsalva maneuver on the toilet, so I suspect it was SVT. She is on propranolol at home for anxiety/palpitations. It is unclear how she is supposed to be taking it. A recent  clinic note from 01/20/13 says to take "10 mg by mouth daily as needed", and this is actually how she has been taking it. But she states she was *supposed* to be taking it twice daily. There is a clinic note from 07/23/12 with instructions to "Take 0.5 tablets (10 mg total) by mouth 2 (two) times daily". She does say that her current palpitations feel different from those she has had before, more severe and it "goes very fast, then slows, then goes fast again."  No history of DVT. She says she had a thyroid surgery in 1994, but is unsure what it was for. She does not take any medications for her thyroid at home. She has a family history of atrial fibrillation in her sister.  In the ED she was given a 500cc NS bolus, ASA 325mg , and then placed on Heparin and procainamide drips.   Current Outpatient Prescriptions on File Prior to Encounter  Medication Sig Dispense Refill  . acetaminophen (TYLENOL) 325 MG tablet Take 650 mg by mouth every 6 (six) hours as needed for pain.      Marland Kitchen albuterol (PROVENTIL HFA;VENTOLIN HFA) 108 (90 BASE) MCG/ACT inhaler Inhale 2 puffs into the lungs every 6 (six) hours as needed for wheezing or shortness of breath.  1 Inhaler  0  . ALPRAZolam (XANAX) 0.5 MG tablet Take 0.5 mg by mouth 3 (  three) times daily as needed for anxiety.       . gabapentin (NEURONTIN) 300 MG capsule Take 300 mg by mouth 3 (three) times daily.        Marland Kitchen loratadine (CLARITIN) 10 MG tablet Take 10 mg by mouth daily as needed for allergies.      Marland Kitchen propranolol (INDERAL) 20 MG tablet Take 10 mg by mouth daily as needed (for palptiations).      . QUEtiapine (SEROQUEL) 25 MG tablet Take 12.5 mg by mouth at bedtime.       . traZODone (DESYREL) 100 MG tablet Take 100 mg by mouth at bedtime.      Marland Kitchen venlafaxine (EFFEXOR-XR) 75 MG 24 hr capsule Take 75 mg by mouth 2 (two) times daily.         Meds: Current Facility-Administered Medications  Medication Dose Route Frequency Provider Last Rate Last Dose  . heparin  ADULT infusion 100 units/mL (25000 units/250 mL)  900 Units/hr Intravenous Continuous April K Palumbo-Rasch, MD 9 mL/hr at 03/15/13 1610 900 Units/hr at 03/15/13 9604  . procainamide (PRONESTYL) 1,000 mg in dextrose 5 % 250 mL infusion  2 mg/min Intravenous Continuous April K Palumbo-Rasch, MD      . procainamide (PRONESTYL) 500 mg in dextrose 5 % 125 mL IVPB  500 mg Intravenous Once April K Palumbo-Rasch, MD   500 mg at 03/15/13 0556  . procainamide (PRONESTYL) 500 mg in dextrose 5 % 125 mL IVPB  500 mg Intravenous Once April Smitty Cords, MD       Current Outpatient Prescriptions  Medication Sig Dispense Refill  . acetaminophen (TYLENOL) 325 MG tablet Take 650 mg by mouth every 6 (six) hours as needed for pain.      Marland Kitchen albuterol (PROVENTIL HFA;VENTOLIN HFA) 108 (90 BASE) MCG/ACT inhaler Inhale 2 puffs into the lungs every 6 (six) hours as needed for wheezing or shortness of breath.  1 Inhaler  0  . ALPRAZolam (XANAX) 0.5 MG tablet Take 0.5 mg by mouth 3 (three) times daily as needed for anxiety.       . gabapentin (NEURONTIN) 300 MG capsule Take 300 mg by mouth 3 (three) times daily.        Marland Kitchen loratadine (CLARITIN) 10 MG tablet Take 10 mg by mouth daily as needed for allergies.      Marland Kitchen propranolol (INDERAL) 20 MG tablet Take 10 mg by mouth daily as needed (for palptiations).      . QUEtiapine (SEROQUEL) 25 MG tablet Take 12.5 mg by mouth at bedtime.       . traZODone (DESYREL) 100 MG tablet Take 100 mg by mouth at bedtime.      Marland Kitchen venlafaxine (EFFEXOR-XR) 75 MG 24 hr capsule Take 75 mg by mouth 2 (two) times daily.         Allergies: Allergies as of 03/15/2013 - Review Complete 03/15/2013  Allergen Reaction Noted  . Guaifenesin Palpitations    Past Medical History  Diagnosis Date  . Anxiety   . Depression     treated at [facility]  . Poisoning by selective serotonin reuptake inhibitors(969.03) Jan 2007    severe reaction with MAOI (nardil) and dextromethorphan  . Goiter      hx of goiter  . Allergic rhinitis   . Chronic sinusitis   . DEGENERATIVE JOINT DISEASE, HANDS     several images revealing OA   Past Surgical History  Procedure Laterality Date  . Right mastoidectomy      ET  tube placement in 1980 and 1990  . Thyroidectomy, partial      nodule removal - April 1996  . Right sinus surgery  october 2000  . Ovarian cyst removal  march 1969    left ovarian cystectomy  . Appendectomy     Family History  Problem Relation Age of Onset  . Hypertension Mother   . Hypertension Father   . Psoriasis Father    History   Social History  . Marital Status: Divorced    Spouse Name: N/A    Number of Children: N/A  . 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 Smoking status: Never Smoker   . Smokeless tobacco: Not on file  . Alcohol Use: No  . Drug Use: No  . Sexual Activity: Not on file   Other Topics Concern  . Not on file   Social History Narrative   Lives alone.    Review of Systems: Pertinent items are noted in HPI.  Physical Exam: Blood pressure 93/58, pulse 163, temperature 98.3 F (36.8 C), temperature source Oral, resp. rate 23, height 5' 2.6" (1.59 m), weight 141 lb 1.5 oz (64 kg), SpO2 100.00%. Physical Exam  Constitutional: She is oriented to person, place, and time and well-developed, well-nourished, and in no distress. No distress.  HENT:  Head: Normocephalic and atraumatic.  Mouth/Throat: Oropharynx is clear and moist.  Eyes: Conjunctivae and EOM are normal. Pupils are equal, round, and reactive to light.  Neck: Normal range of motion. Neck supple. No thyromegaly present.  Cardiovascular: Normal heart sounds and intact distal pulses.  An irregularly irregular rhythm present. Tachycardia present.  Exam reveals no gallop and no friction rub.   No murmur heard. No LE edema.  Pulmonary/Chest: Effort normal and breath sounds normal. No respiratory distress.  She has no wheezes. She has no rales. She exhibits no tenderness.  Abdominal: Soft. She exhibits no distension. There is no tenderness.  Musculoskeletal: Normal range of motion. She exhibits no edema and no tenderness.  No calf pain or swelling.  Neurological: She is alert and oriented to person, place, and time. GCS score is 15.  Skin: Skin is warm and dry. No rash noted. She is not diaphoretic.  Psychiatric: Her mood appears anxious.    Lab results: Basic Metabolic Panel:  Recent Labs  96/04/54 0558  NA 141  K 4.2  CL 105  GLUCOSE 185*  BUN 19  CREATININE 0.70   CBC:  Recent Labs  03/15/13 0454 03/15/13 0558  WBC 11.9*  --   NEUTROABS 8.8*  --   HGB 12.7 13.9  HCT 37.7 41.0  MCV 88.9  --   PLT 509*  --    Cardiac Enzymes: Troponin i, poc  Date Value Range Status  03/15/2013 0.07  0.00 - 0.08 ng/mL Final   BNP: No results found for this basename: PROBNP,  in the last 72 hours   D-Dimer: No results found for this basename: DDIMER,  in the last 72 hours  Thyroid Function Tests: No results found for this basename: TSH, T4TOTAL, FREET4, T3FREE, THYROIDAB,  in the last 72 hours  Coagulation:  Recent Labs  03/15/13 0454  LABPROT 14.0  INR 1.10   Imaging results:  Dg Chest Port 1 View  03/15/2013   *RADIOLOGY REPORT*  Clinical Data: Shortness of breath  PORTABLE CHEST - 1 VIEW  Comparison: 01/02/2013  Findings: Hyperinflation with apical scarring and interstitial coarsening.  No confluent airspace opacity.  Heart size upper normal.  Mild aortic tortuosity without dilatation.  No appreciable pneumothorax or pleural effusion.  No acute osseous finding.  IMPRESSION: Chronic changes as above.  No acute process identified.   Original Report Authenticated By: Jearld Lesch, M.D.    Other results: EKG: atrial fibrillation, rate 157. Normal axis. No concerning ST/T wave changes. Not present on only prior EKG which is from 11/17/2008.  Assessment & Plan by  Problem: Ms.Aylinn H Schrupp is a 62 y.o. female with PMH of anxiety, depression, and DJD/OA who presents today with palpitations.  #Atrial fibrillation - Appears to be new onset. Pt patient reports a long history of heart palpitations since she was a girl, but this does not appear to have been atrial fibrillation, possibly was SVT. Exact time of current symptom onset unclear, but appears to have been <48 hours. A trigger appears to be stress with her niece dying. She has a history of thyroid surgery but takes no medicine for her thyroid and ROS is negative except for anxiety. We will rule out hyper/hypothyroidism. PE is low possibility (Wells = 1.5), so we will check a D-dimer. Chads-Vasc score is 1 (point for female gender). Her stroke risk is low, 1.3% per year according to [employer]. [employer]'s 2010 stroke study and the European Society of Cardiology's guidelines.  - Admit to IMTS, tele - Consult to cardiology for recommendations - Continue heparin gtt for anticoagulation - Continue procainamide gtt in anticipation of chemical cardioversion (since onset appears to have been <48 hours) - 2D echo - Lipid panel - Trending troponins - Call [facility] for records - Follow up TSH, free T4, pro-BNP, D-dimer  #DVT PPX - Heparin gtt  Dispo: Disposition is deferred at this time, awaiting improvement of current medical problems. Anticipated discharge in approximately 1-3 day(s).   The patient does have a current PCP (Neema Davina Poke, MD) and does need an [facility] hospital follow-up appointment after discharge.  The patient does not have transportation limitations that hinder transportation to clinic appointments.  Signed: Vivi Barrack, MD 03/15/2013, 6:54 AM

## 2013-03-16 LAB — BASIC METABOLIC PANEL
CO2: 23 mEq/L (ref 19–32)
GFR calc non Af Amer: >90 mL/min (ref >90)
Glucose, Bld: 98 mg/dL (ref 70–99)
Potassium: 3.3 mEq/L — ABNORMAL LOW (ref 3.5–5.1)
Sodium: 139 mEq/L (ref 135–145)

## 2013-03-16 LAB — LIPID PANEL
Total CHOL/HDL Ratio: 2.8 RATIO
VLDL: 14 mg/dL (ref 0–40)

## 2013-03-16 LAB — CBC
Hemoglobin: 10.2 g/dL — ABNORMAL LOW (ref 12.0–15.0)
MCH: 29.2 pg (ref 26.0–34.0)
MCV: 88.8 fL (ref 78.0–100.0)
Platelets: 316 10*3/uL (ref 150–400)
RBC: 3.49 MIL/uL — ABNORMAL LOW (ref 3.87–5.11)

## 2013-03-16 MED ORDER — SULFAMETHOXAZOLE-TMP DS 800-160 MG PO TABS: 1.0000 | ORAL_TABLET | Freq: Two times a day (BID) | ORAL | Status: DC

## 2013-03-16 MED ORDER — PROPRANOLOL HCL ER 60 MG PO CP24: 60.0000 mg | ORAL_CAPSULE | Freq: Every day | ORAL | Status: DC

## 2013-03-16 MED ORDER — HEPARIN (PORCINE) IN NACL 100-0.45 UNIT/ML-% IJ SOLN: 1100.0000 [IU]/h | INTRAMUSCULAR | Status: DC

## 2013-03-16 MED ORDER — POTASSIUM CHLORIDE CRYS ER 20 MEQ PO TBCR
40.0000 meq | EXTENDED_RELEASE_TABLET | ORAL | Status: AC
  Administered 2013-03-16 (×2): 40 meq via ORAL
  Filled 2013-03-16 (×2): qty 2

## 2013-03-16 MED ORDER — SULFAMETHOXAZOLE-TMP DS 800-160 MG PO TABS
1.0000 | ORAL_TABLET | Freq: Two times a day (BID) | ORAL | Status: DC
  Filled 2013-03-16 (×2): qty 1

## 2013-03-16 NOTE — Progress Notes (Addendum)
Discharged home by wheelchair accompanied by brother- in law., stable, discharge instructions and prescription given to pt. Belongings  with pt.

## 2013-03-16 NOTE — Progress Notes (Signed)
Subjective:  NAE o/n. Patient made NPO after having trouble swallowing a pil due to laying flat. No tachycardia.  Objective: Vital signs in last 24 hours: Filed Vitals:   03/16/13 0800 03/16/13 1100 03/16/13 1200 03/16/13 1208  BP: 94/75 115/77 113/71   Pulse: 92 81 89   Temp:    98.1 F (36.7 C)  TempSrc:    Oral  Resp:   17   Height:      Weight:      SpO2: 96% 100% 99%    Weight change: 3 lb 12 oz (1.7 kg)  Intake/Output Summary (Last 24 hours) at 03/16/13 1323 Last data filed at 03/16/13 1153  Gross per 24 hour  Intake   2923 ml  Output    700 ml  Net   2223 ml   Constitutional: She is oriented to person, place, and time and well-developed, well-nourished, and in no distress. No distress.  HENT:  Head: Normocephalic and atraumatic.  Mouth/Throat: Oropharynx is clear and moist.  CV: RRR, no murmur Pulmonary/Chest: Effort normal and breath sounds normal. No respiratory distress. She has no wheezes. She has no rales. She exhibits no tenderness.  Abdominal: Soft. She exhibits no distension. There is no tenderness   Lab Results: Basic Metabolic Panel:  Recent Labs Lab 03/15/13 0558 03/15/13 0756 03/16/13 0840  NA 141  --  139  K 4.2  --  3.3*  CL 105  --  108  CO2  --   --  23  GLUCOSE 185*  --  98  BUN 19  --  9  CREATININE 0.70  --  0.62  CALCIUM  --   --  8.4  MG  --  2.1  --    CBC:  Recent Labs Lab 03/15/13 0454 03/15/13 0558 03/16/13 0355  WBC 11.9*  --  8.3  NEUTROABS 8.8*  --   --   HGB 12.7 13.9 10.2*  HCT 37.7 41.0 31.0*  MCV 88.9  --  88.8  PLT 509*  --  316   Cardiac Enzymes:  Recent Labs Lab 03/15/13 1105 03/15/13 1732  TROPONINI <0.30 <0.30   BNP:  Recent Labs Lab 03/15/13 0651  PROBNP 2917.0*   D-Dimer:  Recent Labs Lab 03/15/13 0454  DDIMER 1.36*   Fasting Lipid Panel:  Recent Labs Lab 03/16/13 0355  CHOL 111  HDL 39*  LDLCALC 58  TRIG 70  CHOLHDL 2.8   Thyroid Function Tests:  Recent Labs Lab  03/15/13 0648  TSH 6.097*  FREET4 0.88   Coagulation:  Recent Labs Lab 03/15/13 0454  LABPROT 14.0  INR 1.10   Micro Results: Recent Results (from the past 240 hour(s))  MRSA PCR SCREENING     Status: None   Collection Time    03/15/13  8:15 AM      Result Value Range Status   MRSA by PCR NEGATIVE  NEGATIVE Final   Comment:            The GeneXpert MRSA Assay (FDA     approved for NASAL specimens     only), is one component of a     comprehensive MRSA colonization     surveillance program. It is not     intended to diagnose MRSA     infection nor to guide or     monitor treatment for     MRSA infections.   Studies/Results: Ct Angio Chest Pe W/cm &/or Wo Cm  03/15/2013   *RADIOLOGY REPORT*  Clinical Data: SOB, new onset atrial fibrillation, elevated D-dimer  CT ANGIOGRAPHY CHEST  Technique:  Multidetector CT imaging of the chest using the standard protocol during bolus administration of intravenous contrast. Multiplanar reconstructed images including MIPs were obtained and reviewed to evaluate the vascular anatomy.  Contrast: OMNIPAQUE IOHEXOL 350 MG/ML SOLN  Comparison: Chest radiograph dated 03/15/2013  Findings: No evidence of pulmonary embolism.  Dependent atelectasis in the bilateral lower lobes.  Mild mosaic attenuation.  Trace bilateral pleural effusions.  No pneumothorax.  Left thyroid is mildly nodular (series 4/image 15).  Mild cardiomegaly.  Trace pericardial fluid.  Ectasia of the ascending thoracic aorta measuring up to 4.0 cm.  No suspicious mediastinal, hilar, or axillary lymphadenopathy.  Visualized upper abdomen is notable for stranding/fluid in the periportal region and trace perihepatic/perisplenic ascites. Incompletely visualized gallbladder is abnormal, likely reflecting noncalcified gallstones (series 4/image 155).  Mild degenerative changes of the visualized thoracolumbar spine. Mild sclerosis involving the upper sternum (sagittal image 59), nonspecific.   IMPRESSION: No evidence of pulmonary embolism.  Trace bilateral pleural effusions.  Small volume upper abdominal ascites with periportal stranding/fluid and possible noncalcified gallstones.  Correlate with LFTs and consider right upper quadrant ultrasound for further evaluation.   Original Report Authenticated By: Charline Bills, M.D.   Dg Chest Port 1 View  03/15/2013   *RADIOLOGY REPORT*  Clinical Data: Shortness of breath  PORTABLE CHEST - 1 VIEW  Comparison: 01/02/2013  Findings: Hyperinflation with apical scarring and interstitial coarsening.  No confluent airspace opacity.  Heart size upper normal.  Mild aortic tortuosity without dilatation.  No appreciable pneumothorax or pleural effusion.  No acute osseous finding.  IMPRESSION: Chronic changes as above.  No acute process identified.   Original Report Authenticated By: Jearld Lesch, M.D.   Medications: I have reviewed the patient's current medications. Scheduled Meds: . gabapentin  300 mg Oral TID  . potassium chloride  40 mEq Oral Q1 Hr x 2  . QUEtiapine  12.5 mg Oral QHS  . traZODone  100 mg Oral QHS  . venlafaxine XR  225 mg Oral Q breakfast   Continuous Infusions: . sodium chloride 100 mL/hr at 03/16/13 0314   PRN Meds:.acetaminophen, albuterol, ALPRAZolam, LORazepam, ondansetron (ZOFRAN) IV Assessment/Plan: Principal Problem:   Atrial fibrillation with RVR Active Problems:   ANXIETY DISORDER, GENERALIZED   GERD   Hx of atrial tachycardia since age 17  Sheri [name] is a 62 y.o. female with PMH of anxiety, depression, and DJD/OA who presents today with palpitations.   #Atrial fibrillation- Resolved - Appeared to be new onset. Pt patient reports a long history of heart palpitations since she was a girl, but this does not appear to have been atrial fibrillation, possibly was SVT. Exact time of current symptom onset unclear, but appears to have been <48 hours. A trigger appears to be stress with her niece dying. She has a  history of thyroid surgery but takes no medicine for her thyroid and ROS is negative except for anxiety. We will rule out hyper/hypothyroidism. PE is low possibility (Wells = 1.5), so we will check a D-dimer. Chads-Vasc score is 1 (point for female gender). Her stroke risk is low, 1.3% per year according to [employer]. [employer]'s 2010 stroke study and the European Society of Cardiology's guidelines.  - Admit to IMTS, tele  - Consult to cardiology for recommendations  - Continue heparin gtt for anticoagulation  - Stop procainamide gtt in anticipation of discharge, start Inderall 60 mg ER on  d/c - 2D echo : Normal EF of 55-60%, mild LVH, no diastolic dysfunction - Lipid panel , LDL 58 - troponins (negative x3) - Follow up TSH 6.09, free T4 0.88, pro-BNP 2917, D-dimer +, CTA negative for PE, positive for calcified gall stones  Gall stones on CTA of Chest -F/U LFTs as outpatient  #DVT PPX - Heparin gtt   Dispo: Disposition is deferred at this time, awaiting improvement of current medical problems.  Anticipated discharge in approximately 1-2 day(s).   The patient does have a current PCP (Neema Davina Poke, MD) and does need an [facility] hospital follow-up appointment after discharge.  The patient does not know have transportation limitations that hinder transportation to clinic appointments.  .Services Needed at time of discharge: Y = Yes, Blank = No PT:   OT:   RN:   Equipment:   Other:     LOS: 1 day   Pleas Koch, MD 03/16/2013, 1:23 PM

## 2013-03-16 NOTE — Progress Notes (Addendum)
ANTICOAGULATION CONSULT NOTE - Follow Up Consult  Pharmacy Consult for heparin Indication: chest pain/ACS  Allergies  Allergen Reactions  . Guaifenesin Palpitations    Patient Measurements: Height: 5\' 5"  (165.1 cm) Weight: 144 lb 13.5 oz (65.7 kg) IBW/kg (Calculated) : 57  Vital Signs: Temp: 99.1 F (37.3 C) (08/27 0724) Temp src: Oral (08/27 0724) BP: 105/72 mmHg (08/27 0724) Pulse Rate: 96 (08/27 0724)  Labs:  Recent Labs  03/15/13 0454 03/15/13 0558 03/15/13 1105 03/15/13 1458 03/15/13 1732 03/16/13 0355  HGB 12.7 13.9  --   --   --  10.2*  HCT 37.7 41.0  --   --   --  31.0*  PLT 509*  --   --   --   --  316  LABPROT 14.0  --   --   --   --   --   INR 1.10  --   --   --   --   --   HEPARINUNFRC  --   --   --  0.38  --  0.19*  CREATININE  --  0.70  --   --   --   --   TROPONINI  --   --  <0.30  --  <0.30  --     Estimated Creatinine Clearance: 65.6 ml/min (by C-G formula based on Cr of 0.7).   Medications:  Scheduled:  . gabapentin  300 mg Oral TID  . QUEtiapine  12.5 mg Oral QHS  . traZODone  100 mg Oral QHS  . venlafaxine XR  225 mg Oral Q breakfast   Infusions:  . sodium chloride 100 mL/hr at 03/16/13 0314  . heparin 900 Units/hr (03/16/13 0313)  . procainamide (PRONESTYL) 4 MG/ML INFUSION 2 mg/min (03/16/13 0113)    Assessment: 62 yo female here with afib on heparin at 900 units/hr and noted below goal (HL= 0.19). Patient has converted to NSR on procainamide.  Echo pending, noted plans for possible discharge.  Goal of Therapy:  Heparin level 0.3-0.7 units/ml Monitor platelets by anticoagulation protocol: Yes   Plan:  -Increase heparin to 1100 units/hr -Recheck a heparin level today if patient remains on anticoagulation -Will recheck a heparin level and CBC in am   Harland German, Pharm D 03/16/2013 8:03 AM

## 2013-03-16 NOTE — Progress Notes (Addendum)
Pt. Seen and examined. Agree with the NP/PA-C note as written.  Unusual and very infrequent episodes of atrial fibrillation - treated by the ER with procainamide? For unclear reasons. Spontaneously converted back to sinus and has maintained this overnight. Currently not on oral anticoagulation - I'm not sure there is an indication for it given the short duration of her arrythmia and infrequency of events. May benefit from EP evaluation in the future as a possible ablation candidate - she has 2 sisters with the same thing, so it may be a familial a-fib. CHADS2VASC score is 0.  Ok for discharge home today from our standpoint. Follow-up with MLP or Dr. Allyson Sabal.  Chrystie Nose, MD, [facility] Attending Cardiologist [facility]

## 2013-03-16 NOTE — Progress Notes (Signed)
[facility]  Subjective: No complaints. Denies further palpations.   Objective: Vital signs in last 24 hours: Temp:  [97.8 F (36.6 C)-99.1 F (37.3 C)] 99.1 F (37.3 C) (08/27 0724) Pulse Rate:  [25-135] 96 (08/27 0724) Resp:  [12-30] 21 (08/26 2200) BP: (83-128)/(59-85) 105/72 mmHg (08/27 0724) SpO2:  [95 %-100 %] 97 % (08/27 0725)    Intake/Output from previous day: 08/26 0701 - 08/27 0700 In: 3378.3 [I.V.:3253.3; IV Piggyback:125] Out: 200 [Urine:200] Intake/Output this shift:    Medications Current Facility-Administered Medications  Medication Dose Route Frequency Provider Last Rate Last Dose  . 0.9 %  sodium chloride infusion   Intravenous Continuous Manuela Schwartz, MD 100 mL/hr at 03/16/13 0314    . acetaminophen (TYLENOL) tablet 650 mg  650 mg Oral Q4H PRN Manuela Schwartz, MD      . albuterol (PROVENTIL HFA;VENTOLIN HFA) 108 (90 BASE) MCG/ACT inhaler 2 puff  2 puff Inhalation Q6H PRN Inez Catalina, MD      . ALPRAZolam Prudy Feeler) tablet 0.5 mg  0.5 mg Oral TID PRN Inez Catalina, MD   0.5 mg at 03/15/13 2145  . gabapentin (NEURONTIN) capsule 300 mg  300 mg Oral TID Inez Catalina, MD   300 mg at 03/15/13 2145  . heparin ADULT infusion 100 units/mL (25000 units/250 mL)  900 Units/hr Intravenous Continuous April K Palumbo-Rasch, MD 9 mL/hr at 03/16/13 0313 900 Units/hr at 03/16/13 0313  . LORazepam (ATIVAN) injection 1 mg  1 mg Intravenous Q6H PRN Linward Headland, MD      . ondansetron [facility]) injection 4 mg  4 mg Intravenous Q6H PRN Manuela Schwartz, MD      . procainamide (PRONESTYL) 1,000 mg in dextrose 5 % 250 mL infusion  2 mg/min Intravenous Continuous April K Palumbo-Rasch, MD 30 mL/hr at 03/16/13 0113 2 mg/min at 03/16/13 0113  . QUEtiapine (SEROQUEL) tablet 12.5 mg  12.5 mg Oral QHS Inez Catalina, MD   12.5 mg at 03/15/13 2146  . traZODone (DESYREL) tablet 100 mg  100 mg Oral QHS Inez Catalina, MD   100 mg at  03/15/13 2146  . venlafaxine XR (EFFEXOR-XR) 24 hr capsule 225 mg  225 mg Oral Q breakfast Inez Catalina, MD   225 mg at 03/15/13 1636    PE: General appearance: alert, cooperative and no distress Lungs: rales at right lower base that clear after cough, otherwise clear bilaterally Heart: regular rate and rhythm, S1, S2 normal, no murmur, click, rub or gallop Extremities: no LEE Pulses: 2+ and symmetric Skin: warm and dry Neurologic: Grossly normal  Lab Results:   Recent Labs  03/15/13 0454 03/15/13 0558 03/16/13 0355  WBC 11.9*  --  8.3  HGB 12.7 13.9 10.2*  HCT 37.7 41.0 31.0*  PLT 509*  --  316   BMET  Recent Labs  03/15/13 0558  NA 141  K 4.2  CL 105  GLUCOSE 185*  BUN 19  CREATININE 0.70   PT/INR  Recent Labs  03/15/13 0454  LABPROT 14.0  INR 1.10   Cholesterol  Recent Labs  03/16/13 0355  CHOL 111   Cardiac Enzymes Cardiac Panel (last 3 results)  Recent Labs  03/15/13 1105 03/15/13 1732  TROPONINI <0.30 <0.30    Assessment/Plan  Principal Problem:   Atrial fibrillation with RVR Active Problems:   ANXIETY DISORDER, GENERALIZED   GERD   Hx of atrial tachycardia since age 45  Plan: Pt  converted to NSR yesterday on procainamide. HR currently in the 90s. BP is stable. No further palpitations. 2D echo performed, reading pending. ? Oral anticoagulant. If she continues to be stable, plan for possible discharge today.     LOS: 1 day    Taquilla Downum M. Sharol Harness, PA-C 03/16/2013 7:50 AM

## 2013-03-16 NOTE — Discharge Summary (Signed)
Name: Sheri [name] MRN: [id number] DOB: 09-05-1950 62 y.o. PCP: Belia Heman, MD  Date of Admission: 03/15/2013  4:31 AM Date of Discharge: 03/16/2013 Attending Physician: No att. providers found  Discharge Diagnosis: 1. Atrial fibrillation with RVR 2. ANXIETY DISORDER, GENERALIZED 3. GERD 4. Hx of atrial tachycardia since age 40  Discharge Medications:   Medication List    STOP taking these medications       propranolol 20 MG tablet  Commonly known as:  INDERAL  Replaced by:  propranolol ER 60 MG 24 hr capsule      TAKE these medications       acetaminophen 325 MG tablet  Commonly known as:  TYLENOL  Take 650 mg by mouth every 6 (six) hours as needed for pain.     albuterol 108 (90 BASE) MCG/ACT inhaler  Commonly known as:  PROVENTIL HFA;VENTOLIN HFA  Inhale 2 puffs into the lungs every 6 (six) hours as needed for wheezing or shortness of breath.     ALPRAZolam 0.5 MG tablet  Commonly known as:  XANAX  Take 0.5 mg by mouth 3 (three) times daily as needed for anxiety.     gabapentin 300 MG capsule  Commonly known as:  NEURONTIN  Take 300 mg by mouth 3 (three) times daily.     loratadine 10 MG tablet  Commonly known as:  CLARITIN  Take 10 mg by mouth daily as needed for allergies.     propranolol ER 60 MG 24 hr capsule  Commonly known as:  INDERAL LA  Take 1 capsule (60 mg total) by mouth daily.     QUEtiapine 25 MG tablet  Commonly known as:  SEROQUEL  Take 12.5 mg by mouth at bedtime.     sulfamethoxazole-trimethoprim 800-160 MG per tablet  Commonly known as:  BACTRIM DS  Take 1 tablet by mouth every 12 (twelve) hours.     traZODone 100 MG tablet  Commonly known as:  DESYREL  Take 100 mg by mouth at bedtime.     venlafaxine XR 75 MG 24 hr capsule  Commonly known as:  EFFEXOR-XR  Take 225 mg by mouth daily.        Disposition and follow-up:   Sheri [name] was discharged from [facility] in Good condition.  At the hospital  follow up visit please address:  1.  Atrial tachycardia, gall stones on CTA of chest  2.  Labs / imaging needed at time of follow-up: EKG, CMP  3.  Pending labs/ test needing follow-up: None  Follow-up Appointments:     Follow-up Information   Follow up with Lars Masson, MD On 03/30/2013. (10:15 am)    Specialty:  Internal Medicine   Contact information:   [address] [place] [address] [phone]       Follow up with Chrystie Nose, MD On 03/31/2013. (11:45 am)    Specialty:  Cardiology   Contact information:   [address] [place] [address] [phone]       Discharge Instructions: Discharge Orders   Future Appointments Provider Department Dept Phone   03/30/2013 10:15 AM Lars Masson, MD [facility] [phone]   03/31/2013 11:45 AM Chrystie Nose, MD [facility] [place] [phone]   Future Orders Complete By Expires   Call MD for:  difficulty breathing, headache or visual disturbances  As directed    Call MD for:  extreme fatigue  As directed  Call MD for:  As directed    Comments:     Palpitations, rapid heart rate, or symptoms similar to what brought you to the hospital.   Diet - low sodium heart healthy  As directed    Discharge instructions  As directed    Comments:     You were diagnosed with atrial fibrillation. We used a drug, procainamide, to stop this abnormal heart rhythm. We would like you to take propranolol ER 60 mg each day to prevent abnormal fast heart rhythms.   Increase activity slowly  As directed       Consultations: Treatment Team:  Runell Gess, MD  Procedures Performed:  Ct Angio Chest Pe W/cm &/or Wo Cm  03/15/2013   *RADIOLOGY REPORT*  Clinical Data: SOB, new onset atrial fibrillation, elevated D-dimer  CT ANGIOGRAPHY CHEST  Technique:  Multidetector CT imaging of the chest using the standard protocol during bolus administration of intravenous  contrast. Multiplanar reconstructed images including MIPs were obtained and reviewed to evaluate the vascular anatomy.  Contrast: OMNIPAQUE IOHEXOL 350 MG/ML SOLN  Comparison: Chest radiograph dated 03/15/2013  Findings: No evidence of pulmonary embolism.  Dependent atelectasis in the bilateral lower lobes.  Mild mosaic attenuation.  Trace bilateral pleural effusions.  No pneumothorax.  Left thyroid is mildly nodular (series 4/image 15).  Mild cardiomegaly.  Trace pericardial fluid.  Ectasia of the ascending thoracic aorta measuring up to 4.0 cm.  No suspicious mediastinal, hilar, or axillary lymphadenopathy.  Visualized upper abdomen is notable for stranding/fluid in the periportal region and trace perihepatic/perisplenic ascites. Incompletely visualized gallbladder is abnormal, likely reflecting noncalcified gallstones (series 4/image 155).  Mild degenerative changes of the visualized thoracolumbar spine. Mild sclerosis involving the upper sternum (sagittal image 59), nonspecific.  IMPRESSION: No evidence of pulmonary embolism.  Trace bilateral pleural effusions.  Small volume upper abdominal ascites with periportal stranding/fluid and possible noncalcified gallstones.  Correlate with LFTs and consider right upper quadrant ultrasound for further evaluation.   Original Report Authenticated By: Charline Bills, M.D.   Dg Chest Port 1 View  03/15/2013   *RADIOLOGY REPORT*  Clinical Data: Shortness of breath  PORTABLE CHEST - 1 VIEW  Comparison: 01/02/2013  Findings: Hyperinflation with apical scarring and interstitial coarsening.  No confluent airspace opacity.  Heart size upper normal.  Mild aortic tortuosity without dilatation.  No appreciable pneumothorax or pleural effusion.  No acute osseous finding.  IMPRESSION: Chronic changes as above.  No acute process identified.   Original Report Authenticated By: Jearld Lesch, M.D.   Admission HPI:  Sheri [name] is a 62 y.o. female with PMH of anxiety,  depression, and DJD/OA who presents today with palpitations.  Onset was sudden yesterday at 4:30pm. It is associated with some SOB and wheezing. She has an albuterol inhaler at home, but has not used it because it typically worsens her tachycardia. Denies chest pain, nausea, vomiting, diarrhea, abdominal pain, leg swelling, orthopnea, PND, rash, hair or skin changes, numbness, weakness, visual changes. She has been under a lot of stress lately as her adult niece died of terminal cancer on 12-05-22. She remarks that she "did CPR on her" and it was very traumatic. Denies caffeine, tobacco, alcohol, drug use. No new medications or supplements. She went to an OSH [facility]) yesterday when the issue began, and had a work-up that included blood work and an EKG. Apparently they said nothing was wrong and sent her home.She returns today because she is still experiencing symptoms.  She says she has a history of heart palpitations since she was 16. Apparently she used to see a cardiologist for this. She does not think it was atrial fibrillation. One of the things she was taught to do for her palpitations was go to the bathroom and perform a valsalva maneuver on the toilet, so I suspect it was SVT. She is on propranolol at home for anxiety/palpitations. It is unclear how she is supposed to be taking it. A recent clinic note from 01/20/13 says to take "10 mg by mouth daily as needed", and this is actually how she has been taking it. But she states she was *supposed* to be taking it twice daily. There is a clinic note from 07/23/12 with instructions to "Take 0.5 tablets (10 mg total) by mouth 2 (two) times daily". She does say that her current palpitations feel different from those she has had before, more severe and it "goes very fast, then slows, then goes fast again."  No history of DVT. She says she had a thyroid surgery in 1994, but is unsure what it was for. She does not take any medications for her thyroid at  home. She has a family history of atrial fibrillation in her sister.  In the ED she was given a 500cc NS bolus, ASA 325mg , and then placed on Heparin and procainamide drips.   Hospital Course by problem list: Principal Problem:   Atrial fibrillation with RVR Active Problems:   ANXIETY DISORDER, GENERALIZED   GERD   Hx of atrial tachycardia since age 52   1. Atrial fibrillation- Resolved  Appeared to be new onset. Pt patient reports a long history of heart palpitations since she was a girl, but this does not appear to have been atrial fibrillation, possibly was SVT. Exact time of current symptom onset unclear, but appears to have been <48 hours. A trigger appears to be stress with her niece dying. She has a history of thyroid surgery but takes no medicine for her thyroid and ROS is negative except for anxiety. TSH was eleavted but free T4 normal. PE is low possibility (Wells = 1.5), D-Dimer was postive and CTA was negative. Chads-Vasc score is 1 (point for female gender). Her stroke risk is low, 1.3% per year according to [employer]. [employer]'s 2010 stroke study and the European Society of Cardiology's guidelines. Thus, the patient is not indicated for anticoagulation at this point. The patients afib was converted into nromal sinus rhythm via procainamide drip. The patient was discharged on Inderall 60 mg ER. - 2D echo : Normal EF of 55-60%, mild LVH, no diastolic dysfunction  - Lipid panel , LDL 58  - troponins (negative x3)  - Follow up TSH 6.09, free T4 0.88, pro-BNP 2917, D-dimer +, CTA negative for PE, positive for calcified gall stones  2. Gall stones on CTA of Chest  -F/U LFTs as outpatient   Discharge Vitals:   BP 96/62  Pulse 96  Temp(Src) 98.1 F (36.7 C) (Oral)  Resp 17  Ht 5\' 5"  (1.651 m)  Wt 144 lb 13.5 oz (65.7 kg)  BMI 24.1 kg/m2  SpO2 96%  Discharge Labs:  Results for orders placed during the hospital encounter of 03/15/13 (from the past 24 hour(s))  TROPONIN I     Status:  None   Collection Time    03/15/13  5:32 PM      Result Value Range   Troponin I <0.30  <0.30 ng/mL  HEPARIN LEVEL (UNFRACTIONATED)  Status: Abnormal   Collection Time    03/16/13  3:55 AM      Result Value Range   Heparin Unfractionated 0.19 (*) 0.30 - 0.70 IU/mL  CBC     Status: Abnormal   Collection Time    03/16/13  3:55 AM      Result Value Range   WBC 8.3  4.0 - 10.5 K/uL   RBC 3.49 (*) 3.87 - 5.11 MIL/uL   Hemoglobin 10.2 (*) 12.0 - 15.0 g/dL   HCT 16.1 (*) 09.6 - 04.5 %   MCV 88.8  78.0 - 100.0 fL   MCH 29.2  26.0 - 34.0 pg   MCHC 32.9  30.0 - 36.0 g/dL   RDW 40.9  81.1 - 91.4 %   Platelets 316  150 - 400 K/uL  LIPID PANEL     Status: Abnormal   Collection Time    03/16/13  3:55 AM      Result Value Range   Cholesterol 111  0 - 200 mg/dL   Triglycerides 70  <782 mg/dL   HDL 39 (*) >95 mg/dL   Total CHOL/HDL Ratio 2.8     VLDL 14  0 - 40 mg/dL   LDL Cholesterol 58  0 - 99 mg/dL  BASIC METABOLIC PANEL     Status: Abnormal   Collection Time    03/16/13  8:40 AM      Result Value Range   Sodium 139  135 - 145 mEq/L   Potassium 3.3 (*) 3.5 - 5.1 mEq/L   Chloride 108  96 - 112 mEq/L   CO2 23  19 - 32 mEq/L   Glucose, Bld 98  70 - 99 mg/dL   BUN 9  6 - 23 mg/dL   Creatinine, Ser 6.21  0.50 - 1.10 mg/dL   Calcium 8.4  8.4 - 30.8 mg/dL   GFR calc non Af Amer >90  >90 mL/min   GFR calc Af Amer >90  >90 mL/min    Signed: Pleas Koch, MD 03/16/2013, 3:25 PM   Time Spent on Discharge: 20 minutes Services Ordered on Discharge: None Equipment Ordered on Discharge: None

## 2013-03-18 NOTE — Discharge Summary (Signed)
I saw Sheri [name] on day of discharge and agree with formulated plan.

## 2013-03-24 ENCOUNTER — Encounter: Payer: Self-pay | Admitting: Internal Medicine

## 2013-03-24 ENCOUNTER — Ambulatory Visit (INDEPENDENT_AMBULATORY_CARE_PROVIDER_SITE_OTHER): Payer: Self-pay | Admitting: Internal Medicine

## 2013-03-24 ENCOUNTER — Ambulatory Visit (HOSPITAL_COMMUNITY)
Admission: RE | Admit: 2013-03-24 | Discharge: 2013-03-24 | Disposition: A | Discharge status: Home / Self Care | Payer: Self-pay | Source: Ambulatory Visit | Attending: Internal Medicine | Admitting: Internal Medicine

## 2013-03-24 VITALS — BP 109/74 | HR 116 | Temp 101.6°F | Resp 24 | Ht 65.5 in | Wt 141.3 lb

## 2013-03-24 DIAGNOSIS — Z8679 Personal history of other diseases of the circulatory system: Secondary | ICD-10-CM

## 2013-03-24 DIAGNOSIS — J209 Acute bronchitis, unspecified: Secondary | ICD-10-CM

## 2013-03-24 DIAGNOSIS — I517 Cardiomegaly: Secondary | ICD-10-CM | POA: Insufficient documentation

## 2013-03-24 DIAGNOSIS — J40 Bronchitis, not specified as acute or chronic: Secondary | ICD-10-CM | POA: Insufficient documentation

## 2013-03-24 DIAGNOSIS — I77810 Thoracic aortic ectasia: Secondary | ICD-10-CM | POA: Insufficient documentation

## 2013-03-24 DIAGNOSIS — R059 Cough, unspecified: Secondary | ICD-10-CM | POA: Insufficient documentation

## 2013-03-24 DIAGNOSIS — R05 Cough: Secondary | ICD-10-CM | POA: Insufficient documentation

## 2013-03-24 DIAGNOSIS — I4891 Unspecified atrial fibrillation: Secondary | ICD-10-CM

## 2013-03-24 LAB — CBC WITH DIFFERENTIAL/PLATELET
Lymphocytes Relative: 18 % (ref 12–46)
Lymphs Abs: 1.7 10*3/uL (ref 0.7–4.0)
MCV: 87.7 fL (ref 78.0–100.0)
Neutro Abs: 6.2 10*3/uL (ref 1.7–7.7)
Neutrophils Relative %: 66 % (ref 43–77)
Platelets: 447 10*3/uL — ABNORMAL HIGH (ref 150–400)
RBC: 3.82 MIL/uL — ABNORMAL LOW (ref 3.87–5.11)
WBC: 9.5 10*3/uL (ref 4.0–10.5)

## 2013-03-24 LAB — URINALYSIS, ROUTINE W REFLEX MICROSCOPIC
Bilirubin Urine: NEGATIVE
Nitrite: NEGATIVE
Specific Gravity, Urine: 1.024 (ref 1.005–1.030)
Urobilinogen, UA: 1 mg/dL (ref 0.0–1.0)

## 2013-03-24 MED ORDER — ACETAMINOPHEN 325 MG PO TABS: 650.0000 mg | ORAL_TABLET | Freq: Four times a day (QID) | ORAL | Status: DC | PRN

## 2013-03-24 NOTE — Progress Notes (Signed)
Patient ID: Sheri [name], female   DOB: [birth year], 62 y.o.   MRN: [id number]   Subjective:   HPI: [name] is a 62 y.o. with past medical history of anxiety, depression, recent diagnosis of atrial fibrillation, and chronic cervicitis, presents to the clinic with symptoms of fatigue with cough for the last 3 days.  She describes her cough is dry without shortness of breath, chest pain, dizziness, nausea, or vomiting. She denies sore throat, postnasal drip, rhinorrhea, ear pain, or muscle congestion. However, she reports history of increased fatigue, with low appetite, and some bilateral muscle calf pains. No joint pains or swellings. She does not have a history of contact with someone with similar symptoms. She reports that he started using Tussin DM yesterday which has not yet helped much. In the clinic. She was found to be febrile, with a temperature 101.6, and a pulse rate of 116. Oxygen saturation of 94% on room air.  She admits to a history of noncompliance with her recent increase dose of propranolol. She takes multiple doses of 10-15 mg daily as opposed to the 60 mg ER capsule as prescribed during her recent admission following a diagnosis of atrial fibrillation. She is aware of her appointment with cardiology on 03/31/2013. She denies shortness of breath, chest pain, syncope, or falls. However she reports palpitations, which are chronic for her.   Past Medical History  Diagnosis Date  . Anxiety   . Depression     treated at [facility]  . Poisoning by selective serotonin reuptake inhibitors(969.03) Jan 2007    severe reaction with MAOI (nardil) and dextromethorphan  . Goiter     hx of goiter  . Allergic rhinitis   . Chronic sinusitis   . DEGENERATIVE JOINT DISEASE, HANDS     several images revealing OA   Current Outpatient Prescriptions  Medication Sig Dispense Refill  . acetaminophen (TYLENOL) 325 MG tablet Take 2 tablets (650 mg total) by mouth every 6 (six) hours as  needed for pain or fever.  30 tablet  o  . albuterol (PROVENTIL HFA;VENTOLIN HFA) 108 (90 BASE) MCG/ACT inhaler Inhale 2 puffs into the lungs every 6 (six) hours as needed for wheezing or shortness of breath.  1 Inhaler  0  . ALPRAZolam (XANAX) 0.5 MG tablet Take 0.5 mg by mouth 3 (three) times daily as needed for anxiety.       . gabapentin (NEURONTIN) 300 MG capsule Take 300 mg by mouth 3 (three) times daily.        Marland Kitchen loratadine (CLARITIN) 10 MG tablet Take 10 mg by mouth daily as needed for allergies.      Marland Kitchen propranolol ER (INDERAL LA) 60 MG 24 hr capsule Take 1 capsule (60 mg total) by mouth daily.  30 capsule  0  . QUEtiapine (SEROQUEL) 25 MG tablet Take 12.5 mg by mouth at bedtime.       . sulfamethoxazole-trimethoprim (BACTRIM DS) 800-160 MG per tablet Take 1 tablet by mouth every 12 (twelve) hours.  14 tablet  0  . traZODone (DESYREL) 100 MG tablet Take 100 mg by mouth at bedtime.      Marland Kitchen venlafaxine XR (EFFEXOR-XR) 75 MG 24 hr capsule Take 225 mg by mouth daily.       No current facility-administered medications for this visit.   Family History  Problem Relation Age of Onset  . Hypertension Mother   . Hypertension Father   . Psoriasis Father    History  Social History  . Marital Status: Widowed    Spouse Name: N/A    Number of Children: N/A  . 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 Smoking status: Never Smoker   . Smokeless tobacco: None  . Alcohol Use: No  . Drug Use: No  . Sexual Activity: None   Other Topics Concern  . None   Social History Narrative   Lives alone.   Review of Systems: Gastrointestinal: No abdominal pain, nausea, vomiting, bloody stools Genitourinary: No dysuria, frequency, hematuria, or flank pain.  Musculoskeletal: No myalgias, back pain, joint swelling, arthralgias Skin: No skin rashes.    Objective:  Physical Exam: Filed Vitals:   03/24/13 1442 03/24/13  1555  BP: 109/74   Pulse: 116   Temp: 101.6 F (38.7 C)   TempSrc: Oral   Resp:  24  Height: 5' 5.5" (1.664 m)   Weight: 141 lb 4.8 oz (64.093 kg)   SpO2: 94%    General: Well nourished, her granddaughter accompanies her. No acute distress. She appears comfortable. HEENT: Oropharynx is clear without erythema or exudates. Bilateral ear exam is unremarkable. Lungs: CTA bilaterally. No increased work of breathing. She is able to speak in full sentences. She coughs a few times every about 3-4 minutes. Heart: Irregularly irregular heart rate with tachycardia; no extra sounds or murmurs  Abdomen: Non-distended, normal BS, soft, nontender; no hepatosplenomegaly  Extremities: No pedal edema. No joint swelling or tenderness. Neurologic: Alert and oriented x3. No obvious neurologic deficits.  Assessment & Plan:  I have discussed my assessment and plan  with Dr. Criselda Peaches  as detailed under problem based charting.

## 2013-03-24 NOTE — Patient Instructions (Addendum)
You probably have an acute bronchitis  I will order some blood check today. Please continue to take your Tussin to help with the cough  I will write Tylenol for you to help with the fevers.  I will also order a chest x ray to rule out pneumonia  I will give you a call regarding the results Please take your Propranolol as prescribed to help with your Atrial Fibrillation  Please call clinic if your symptoms worsen or if you develop chest pain, shortness of breath, or increased fatigue. Keep your appointment with Dr Yetta Barre on 9/10    Acute Bronchitis Bronchitis is when the organs and tissues involved in breathing get puffy (swollen) and can leak fluid. This makes it harder for air to get in and out of the lungs. You may cough a lot and produce thick spit (mucus). Acute means the illness started suddenly. HOME CARE  Rest.  Drink enough fluids to keep the pee (urine) clear or pale yellow.  Medicines may be given that will open up your airways to help you breathe better. Only take medicine as told by your doctor.  Use a cool mist vaporizer. This will help to thin any thick spit.  Do not smoke. Avoid secondhand smoke. GET HELP RIGHT AWAY IF:   You have a temperature by mouth above 102 F (38.9 C), not controlled by medicine.  You have chills.  You develop severe shortness of breath or chest pain.  You have bloody spit mixed with mucus (sputum).  You throw up (vomit) often.  You lose too much body fluid (dehydrated).  You have a severe headache.  You feel faint.  You do not improve after 1 week of treatment. MAKE SURE YOU:   Understand these instructions.  Will watch your condition.  Will get help right away if you are not doing well or get worse. Document Released: 12/24/2007 Document Revised: 09/29/2011 Document Reviewed: 07/25/2009 [facility] Patient Information 2014 [place], [place].

## 2013-03-24 NOTE — Assessment & Plan Note (Signed)
Pt not compliant with Propranolol.   Plan  - Encouraged to take her medications as prescribed  - mentioned to her the need to have rate control with Atrial Fibrillation  - follow up with card on 9/11

## 2013-03-24 NOTE — Assessment & Plan Note (Addendum)
Most likely etiology of her symptoms is acute bronchitis. She is febrile, with a temperature of 101.6. Oxygen saturation 94%. Lung exam is clear. Chest x-ray, consistent with bronchitis, without evidence of pneumonia. Small pleural effusion noted. This was present during her hospitalization on a chest CT angiogram. Patient left before ambulatory oxygen was performed. CBC without leukocytosis.  Plan. - Contacted the patient on the phone and informed her of her results including the chest x-ray and CBC without leukocytosis. - Will manage symptomatically with Tussin DM and Tylenol - No indication for abx - I encouraged her to call the clinic in case of chest pain, shortness of breath, increased fevers, or fatigue. She verbalized understanding. - We will mail her AVS

## 2013-03-25 ENCOUNTER — Telehealth: Payer: Self-pay | Admitting: *Deleted

## 2013-03-25 LAB — URINALYSIS, MICROSCOPIC ONLY
Casts: NONE SEEN
Crystals: NONE SEEN

## 2013-03-25 NOTE — Telephone Encounter (Signed)
Message copied by Hassan Buckler on Fri Mar 25, 2013 11:54 AM ------      Message from: Dow Adolph      Created: Thu Mar 24, 2013  7:39 PM      Regarding: mail AVS       Hi Keshonna Valvo,             Please help print and mail her AVS to her. I already talked to her on phone.             Thanks             Dr Kirtland Bouchard ------

## 2013-03-25 NOTE — Telephone Encounter (Signed)
AVS placed in the mail as requested per Dr Virgina Organ.

## 2013-03-30 ENCOUNTER — Encounter: Payer: Self-pay | Admitting: Internal Medicine

## 2013-03-30 ENCOUNTER — Ambulatory Visit (INDEPENDENT_AMBULATORY_CARE_PROVIDER_SITE_OTHER): Payer: Self-pay | Admitting: Internal Medicine

## 2013-03-30 ENCOUNTER — Ambulatory Visit (HOSPITAL_COMMUNITY)
Admission: RE | Admit: 2013-03-30 | Discharge: 2013-03-30 | Disposition: A | Discharge status: Home / Self Care | Payer: Self-pay | Source: Ambulatory Visit | Attending: Internal Medicine | Admitting: Internal Medicine

## 2013-03-30 VITALS — BP 102/69 | HR 75 | Temp 97.2°F | Ht 65.5 in | Wt 141.3 lb

## 2013-03-30 DIAGNOSIS — I4891 Unspecified atrial fibrillation: Secondary | ICD-10-CM

## 2013-03-30 DIAGNOSIS — K8020 Calculus of gallbladder without cholecystitis without obstruction: Secondary | ICD-10-CM

## 2013-03-30 LAB — COMPREHENSIVE METABOLIC PANEL
Alkaline Phosphatase: 94 U/L (ref 39–117)
BUN: 8 mg/dL (ref 6–23)
Glucose, Bld: 92 mg/dL (ref 70–99)
Total Bilirubin: 0.2 mg/dL — ABNORMAL LOW (ref 0.3–1.2)

## 2013-03-30 NOTE — Assessment & Plan Note (Signed)
Patient recently admitted for A-fib w/ RVR and advised to start Inderal LA 60 mg qd for this. Today she presents to the clinic with no significant complaints of palpitations like she has felt before. She denies any SOB, chest pain, or lightheadedness.  Instead of taking the Inderal LA, she has been taking her original Inderal at 10 mg doses, qid because she says high doses of this medications make her fatigue. She is going to follow up with Dr. Rennis Golden tomorrow regarding her A-fib w/ RVR episode.  -EKG performed in clinic today shows NSR @ 77.  -Instructed patient to take her discharged dose of Inderal LA.

## 2013-03-30 NOTE — Assessment & Plan Note (Signed)
During her hospital admission, patient was shown to have gallstones when CT angio of the chest was performed.  -CMET performed today in clinic shows no significant abnormalities.  -Patient exhibits no signs of cholecystitis.

## 2013-03-30 NOTE — Patient Instructions (Addendum)
1. Please make a follow up appointment with Dr. Everardo Beals in about 4 weeks. 2. Please take all medications as prescribed.  3. If you have worsening of your symptoms or new symptoms arise, please call the clinic [phone]), or go to the ER immediately if symptoms are severe.  You have done great job in taking all your medications. I appreciate it very much. Please continue doing that.

## 2013-03-30 NOTE — Progress Notes (Signed)
Case discussed with Dr. Kazibwe soon after the resident saw the patient.  We reviewed the resident's history and exam and pertinent patient test results.  I agree with the assessment, diagnosis, and plan of care documented in the resident's note. 

## 2013-03-30 NOTE — Progress Notes (Signed)
Subjective:   Patient ID: Sheri [name] female   DOB: 1951/05/03 62 y.o.   MRN: [id number]  HPI: [name] is a 62 y.o. F w/ PMHx of anxiety, depression, DJD of the hands, and goiter, presents to the clinic for a hospital follow-up. The patient was recently discharged from the hospital on 03/16/13 after A-fib w/ RVR. She converted to NSR in the hospital on a procainamide drip and discharged on Inderall ER 60 mg.   She presents to clinic today with no complaints. She denies any recent palpitations, chest pain, SOB, lightheadedness, or LOC. She did mention some slight dizziness, but not associated with any palpitations, and not bothersome to the patient. She was taking Inderal 20 mg prn for palpitations in the past as she has had minor palpitations prior to her hospital admission. After her discharge, she was instructed to start the Inderal LA (long acting) 60 mg qd, but took her previous Inderal at 10 mg doses, qid. When asked why she did not follow her discharge instructions, she claimed she did not want to take the higher dose as it has causes her significant fatigue in the past. The patient has also recently seen in the [facility] (03/24/13) for acute bronchitis. At that time, she was also found to be non-compliant with the suggested dose of Inderal LA.  EKG performed in clinic today showed NSR @ 77 bpm.  Also of note, it was found on CT chest during her admission, that she had multiple non-obstructive gall stones. She denies any RUQ pain, diarrhea, nausea, vomiting, fever or chills. Physical exam unremarkable for cholecystitis.   She denies any further issues today. She is scheduled to follow up with Dr. Rennis Golden tomorrow (03/31/13) for cardiology follow up.   Past Medical History  Diagnosis Date  . Anxiety   . Depression     treated at [facility]  . Poisoning by selective serotonin reuptake inhibitors(969.03) Jan 2007    severe reaction with MAOI (nardil) and dextromethorphan  . Goiter    hx of goiter  . Allergic rhinitis   . Chronic sinusitis   . DEGENERATIVE JOINT DISEASE, HANDS     several images revealing OA   Current Outpatient Prescriptions  Medication Sig Dispense Refill  . acetaminophen (TYLENOL) 325 MG tablet Take 2 tablets (650 mg total) by mouth every 6 (six) hours as needed for pain or fever.  30 tablet  o  . albuterol (PROVENTIL HFA;VENTOLIN HFA) 108 (90 BASE) MCG/ACT inhaler Inhale 2 puffs into the lungs every 6 (six) hours as needed for wheezing or shortness of breath.  1 Inhaler  0  . ALPRAZolam (XANAX) 0.5 MG tablet Take 0.5 mg by mouth 3 (three) times daily as needed for anxiety.       . gabapentin (NEURONTIN) 300 MG capsule Take 300 mg by mouth 3 (three) times daily.        Marland Kitchen loratadine (CLARITIN) 10 MG tablet Take 10 mg by mouth daily as needed for allergies.      Marland Kitchen propranolol ER (INDERAL LA) 60 MG 24 hr capsule Take 1 capsule (60 mg total) by mouth daily.  30 capsule  0  . QUEtiapine (SEROQUEL) 25 MG tablet Take 12.5 mg by mouth at bedtime.       . sulfamethoxazole-trimethoprim (BACTRIM DS) 800-160 MG per tablet Take 1 tablet by mouth every 12 (twelve) hours.  14 tablet  0  . traZODone (DESYREL) 100 MG tablet Take 100 mg by mouth at bedtime.      Marland Kitchen  venlafaxine XR (EFFEXOR-XR) 75 MG 24 hr capsule Take 225 mg by mouth daily.       No current facility-administered medications for this visit.   Family History  Problem Relation Age of Onset  . Hypertension Mother   . Hypertension Father   . Psoriasis Father    History   Social History  . Marital Status: Widowed    Spouse Name: N/A    Number of Children: N/A  . 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 Smoking status: Never Smoker   . Smokeless tobacco: None  . Alcohol Use: No  . Drug Use: No  . Sexual Activity: None   Other Topics Concern  . None   Social History Narrative   Lives alone.   Review of  Systems: General: Denies fever, chills, diaphoresis, appetite change and fatigue.  Respiratory: Denies SOB, DOE, cough, chest tightness, and wheezing.   Cardiovascular: Denies chest pain, palpitations and leg swelling.  Gastrointestinal: Denies nausea, vomiting, abdominal pain, diarrhea, constipation, blood in stool and abdominal distention.  Genitourinary: Denies dysuria, urgency, frequency, hematuria, flank pain and difficulty urinating.  Skin: Denies pallor, rash and wounds.  Neurological: Positive for mild intermittent dizziness. Denies seizures, syncope, weakness, lightheadedness, numbness and headaches.  Psychiatric/Behavioral: Denies mood changes, confusion, nervousness, sleep disturbance and agitation.  Objective:   Physical Exam: Filed Vitals:   03/30/13 1029  BP: 102/69  Pulse: 75  Temp: 97.2 F (36.2 C)  TempSrc: Oral  Height: 5' 5.5" (1.664 m)  Weight: 141 lb 4.8 oz (64.093 kg)  SpO2: 96%   General: Vital signs reviewed.  Patient is a well-developed and well-nourished, in no acute distress and cooperative with exam. Alert and oriented x3.  Head: Normocephalic and atraumatic. Nose: No erythema or drainage noted.  Turbinates normal. Eyes: PERRL, EOMI, conjunctivae normal, No scleral icterus.  Neck: Supple, trachea midline, normal ROM, No JVD or carotid bruit present.  Cardiovascular: RRR, S1 normal, S2 normal, no murmurs, gallops, or rubs. Pulmonary/Chest: Normal respiratory effort, CTAB, no wheezes, rales, or rhonchi. Abdominal: Soft. Non-tender, non-distended, bowel sounds are normal, no masses, organomegaly, or guarding present.  Extremities: No swelling or edema,  pulses symmetric and intact bilaterally. No cyanosis or clubbing. Neurological: A&O x3. Skin: Warm, dry and intact. No rashes or erythema. Psychiatric: Normal mood and affect. speech and behavior is normal. Cognition and memory are normal.   Assessment & Plan:   Please see problem-based assessment and  plan.

## 2013-03-31 ENCOUNTER — Encounter: Payer: Self-pay | Admitting: Internal Medicine

## 2013-03-31 ENCOUNTER — Ambulatory Visit (INDEPENDENT_AMBULATORY_CARE_PROVIDER_SITE_OTHER): Payer: Self-pay | Admitting: Internal Medicine

## 2013-03-31 VITALS — BP 106/72 | HR 76 | Ht 65.5 in | Wt 139.0 lb

## 2013-03-31 DIAGNOSIS — F411 Generalized anxiety disorder: Secondary | ICD-10-CM

## 2013-03-31 DIAGNOSIS — I4891 Unspecified atrial fibrillation: Secondary | ICD-10-CM

## 2013-03-31 MED ORDER — PROPRANOLOL HCL 10 MG PO TABS: ORAL_TABLET | ORAL | Status: DC

## 2013-03-31 NOTE — Progress Notes (Signed)
OFFICE NOTE  Chief Complaint:  Hospital follow-up  Primary Care Physician: Stacy Gardner, MD  HPI:  Sheri [name] is a 62 year female who was recently seen in the hospital for palpitations associated with some difficulty with breathing. She attempted an inhaler and her propranolol X 3 (she takes intermittently for fast heart rate, she took a total of 60mg ). She further tried valsalva maneuver. She did not have success getting her heart rate down. She presented to [facility] when the issue began, however, they gave her medicine and did an EKG and sent her home. When the issue persisted, she presented to [facility] after taking 20 mg more of propranolol . She reported a history of palpitations for which she takes prn propranolol (20mg ) (noted as supposed to be taking 1/2 pill - 10mg  in chart BID had not taken regularly for 2-3 months and this was her 3rd episode of tachycardia) and attempts valsalva. These epidsodes of tachycardia started around the time she was 62 years old. Dr. Lovell Sheehan was her cardiologist at the time. She has had to be evaluated in the hospital in the past for these issues but is occasionally able to control them at home. She has had increased social stressors recently as her niece died. Further associated symptom was diarrhea while she was trying to valsalva, this is normal for her with the tachycardia. She has a history of thyroid surgery, she is not treated with synthroid. She further denies a history of DVT, heart disease or any other medical problems. She is treated for anxiety and panic attacks with seroquel, trazadone, effexor and xanax. She has been on stable doses of these medications by report. In Er she was in A fib with RVR she was placed on procainamide drip. She then converted to SR. She had echo done which was generally normal. She then spontaneously converted to sinus rhythm. She does report 2 of her sisters with the same disorder - they have both undergone a-fib  ablations which have successfully terminated their arrhythmias.  PMHx:  Past Medical History  Diagnosis Date  . Anxiety   . Depression     treated at [facility]  . Poisoning by selective serotonin reuptake inhibitors(969.03) Jan 2007    severe reaction with MAOI (nardil) and dextromethorphan  . Goiter     hx of goiter  . Allergic rhinitis   . Chronic sinusitis   . DEGENERATIVE JOINT DISEASE, HANDS     several images revealing OA    Past Surgical History  Procedure Laterality Date  . Right mastoidectomy      ET tube placement in 1980 and 1990  . Thyroidectomy, partial      nodule removal - April 1996  . Right sinus surgery  october 2000  . Ovarian cyst removal  march 1969    left ovarian cystectomy  . Appendectomy      FAMHx:  Family History  Problem Relation Age of Onset  . Hypertension Mother   . Hypertension Father   . Psoriasis Father     SOCHx:   reports that she has never smoked. She has never used smokeless tobacco. She reports that she does not drink alcohol or use illicit drugs.  ALLERGIES:  Allergies  Allergen Reactions  . Guaifenesin Palpitations    ROS: A comprehensive review of systems was negative.  HOME MEDS: Current Outpatient Prescriptions  Medication Sig Dispense Refill  . acetaminophen (TYLENOL) 325 MG tablet Take 2 tablets (650 mg total) by  mouth every 6 (six) hours as needed for pain or fever.  30 tablet  o  . albuterol (PROVENTIL HFA;VENTOLIN HFA) 108 (90 BASE) MCG/ACT inhaler Inhale 2 puffs into the lungs every 6 (six) hours as needed for wheezing or shortness of breath.  1 Inhaler  0  . ALPRAZolam (XANAX) 0.5 MG tablet Take 0.5 mg by mouth 3 (three) times daily as needed for anxiety.       . gabapentin (NEURONTIN) 300 MG capsule Take 300 mg by mouth 3 (three) times daily.        Marland Kitchen loratadine (CLARITIN) 10 MG tablet Take 10 mg by mouth daily as needed for allergies.      Marland Kitchen QUEtiapine (SEROQUEL) 25 MG tablet Take 12.5 mg by mouth  at bedtime.       . traZODone (DESYREL) 100 MG tablet Take 100 mg by mouth at bedtime.      Marland Kitchen venlafaxine XR (EFFEXOR-XR) 75 MG 24 hr capsule Take 225 mg by mouth daily.      . propranolol (INDERAL) 10 MG tablet Take 1 tablet as needed for palpitations. Do not exceed 4 tablets daily.  120 tablet  11   No current facility-administered medications for this visit.    LABS/IMAGING: Results for orders placed in visit on 03/30/13 (from the past 48 hour(s))  COMPREHENSIVE METABOLIC PANEL     Status: Abnormal   Collection Time    03/30/13 11:29 AM      Result Value Range   Sodium 138  135 - 145 mEq/L   Potassium 3.8  3.5 - 5.3 mEq/L   Chloride 104  96 - 112 mEq/L   CO2 27  19 - 32 mEq/L   Glucose, Bld 92  70 - 99 mg/dL   BUN 8  6 - 23 mg/dL   Creat 4.09  8.11 - 9.14 mg/dL   Total Bilirubin 0.2 (*) 0.3 - 1.2 mg/dL   Alkaline Phosphatase 94  39 - 117 U/L   AST 18  0 - 37 U/L   ALT 33  0 - 35 U/L   Total Protein 7.3  6.0 - 8.3 g/dL   Albumin 3.0 (*) 3.5 - 5.2 g/dL   Calcium 9.2  8.4 - 78.2 mg/dL   No results found.  VITALS: BP 106/72  Pulse 76  Ht 5' 5.5" (1.664 m)  Wt 139 lb (63.05 kg)  BMI 22.77 kg/m2  EXAM: General appearance: alert and no distress Neck: no adenopathy, no carotid bruit, no JVD, supple, symmetrical, trachea midline and thyroid not enlarged, symmetric, no tenderness/mass/nodules Lungs: clear to auscultation bilaterally Heart: regular rate and rhythm, S1, S2 normal, no murmur, click, rub or gallop Abdomen: soft, non-tender; bowel sounds normal; no masses,  no organomegaly Extremities: extremities normal, atraumatic, no cyanosis or edema Pulses: 2+ and symmetric Skin: Skin color, texture, turgor normal. No rashes or lesions Neurologic: Grossly normal  EKG: deferred  ASSESSMENT: 1. Paroxysmal atrial fibrillation 2. Family history of arrhythmia with 2 sisters who have had AF ablation  PLAN: 1.   Mrs. Mccuen has infrequent but worsening episodes of atrial  fibrillation that are short lived. It is unclear whether she has tried antiarrhythmic therapy before and failed, however her pocket pill strategy does not seem to be working. She reports significant fatigue on beta blockers, possibly because she is thin and has a low blood pressure at baseline. I do not think she is going to tolerate long-acting propranolol and she feels that it is significantly fatiguing. We  will go ahead and then recommend she goes back to her strategy of low-dose propranolol short acting tablets as needed. At this point, since she is unlikely to tolerate rate control and has significant medications which may interact with rhythm control agents (such as Seroquel, trazodone and Effexor-all of which she has been stable on), I think she would be a great candidate to consider going to atrial fibrillation ablation. Especially given her family history. Hopefully this will allow her to come off of her Inderal completely. She is interested in this and I'll refer her to Dr. Hillis Range for further evaluation.  Chrystie Nose, MD, [facility] Attending Cardiologist [facility]  Jesscia Imm C 03/31/2013, 12:39 PM

## 2013-03-31 NOTE — Patient Instructions (Addendum)
Your physician has recommended you make the following change in your medication: Take 10mg  Propranolol as need for palpitations, up to 4 doses daily.  Follow up with Dr. Rennis Golden as needed. We have referred you to Dr. Hillis Range for atrial fibrillation ablation.

## 2013-04-04 NOTE — Progress Notes (Signed)
I saw and evaluated the patient.  I personally confirmed the key portions of the history and exam documented by Dr. Jones and I reviewed pertinent patient test results.  The assessment, diagnosis, and plan were formulated together and I agree with the documentation in the resident's note.   

## 2013-04-13 ENCOUNTER — Ambulatory Visit: Payer: Self-pay | Admitting: Internal Medicine

## 2013-04-14 ENCOUNTER — Ambulatory Visit (INDEPENDENT_AMBULATORY_CARE_PROVIDER_SITE_OTHER): Payer: Self-pay | Admitting: Internal Medicine

## 2013-04-14 ENCOUNTER — Encounter: Payer: Self-pay | Admitting: Internal Medicine

## 2013-04-14 VITALS — BP 90/60 | HR 78 | Temp 97.6°F | Ht 65.5 in | Wt 141.6 lb

## 2013-04-14 DIAGNOSIS — I4891 Unspecified atrial fibrillation: Secondary | ICD-10-CM

## 2013-04-14 DIAGNOSIS — K219 Gastro-esophageal reflux disease without esophagitis: Secondary | ICD-10-CM

## 2013-04-14 DIAGNOSIS — Z0000 Encounter for general adult medical examination without abnormal findings: Secondary | ICD-10-CM

## 2013-04-14 DIAGNOSIS — Z23 Encounter for immunization: Secondary | ICD-10-CM

## 2013-04-14 MED ORDER — RANITIDINE HCL 150 MG PO TABS: 150.0000 mg | ORAL_TABLET | Freq: Two times a day (BID) | ORAL | Status: DC

## 2013-04-14 NOTE — Assessment & Plan Note (Signed)
Patient epigastric pain is most likely caused by GERD. She has a history of GERD, but currently not on any medications. She does not have alarming symptoms, such as a weight loss and hematochezia. Physical examination no groin or maxillary node enlargement.   - will treat her empirically for GERD. I offered patient PPI, but the patient cannot afford for it. I switched to Ranitidine - Patient is instructed to come back, if she develops any alarming symptoms, such as weight loss, bloody stool, fever or chills. - Patient was instructed to avoid NSAIDs.

## 2013-04-14 NOTE — Patient Instructions (Addendum)
1. Please take Ranitidine 150 mg twice a day. 2. Please avoid taking any NSAIDs medications as list before.  3. If you have worsening of your symptoms or new symptoms arise, please call the clinic ([phone]), or go to the ER immediately if symptoms are severe.  You have done great job in taking all your medications. I appreciate it very much. Please continue doing that.    NSAIDs medications  A peptic ulcer is a defect that forms in the lining of the stomach or first part of the small intestine.  CAUSES  Most peptic ulcers are caused by infection with the bacterium H. pylori (Helicobacter pylori). Some peptic ulcers are caused by prolonged use of NSAIDs (nonsteroidal anti-inflammatory drugs). NSAIDs include aspirin, ibuprofen, and naproxen sodium. SYMPTOMS  An ulcer can cause:  A gnawing, burning pain in the upper abdomen.  Nausea.  Vomiting.  Loss of appetite.  Weight loss.  Fatigue. Normally the stomach has three defenses against digestive juices:   Mucus that coats the stomach lining and shields it from stomach acid.  The chemical bicarbonate that neutralizes stomach acid.  Blood circulation to the stomach lining that aids in cell renewal and repair. NSAIDs hinder all of these protective mechanisms. With the stomach's defenses down, digestive juices can damage the sensitive stomach lining and cause ulcers. TREATMENT   NSAID-induced ulcers usually heal once the person stops taking the medication. Your caregiver may recommend taking antacids to neutralize the acid. Antacids help the healing process and relieve symptoms. Drugs called H2-blockers or proton-pump inhibitors decrease the amount of acid the stomach produces. Medicines that protect the stomach lining also help with healing.  If a person with an NSAID ulcer also tests positive for H. pylori, he or she will be treated with antibiotics.  Surgery may be necessary if an ulcer recurs or fails to heal.  Surgery may also  be necessary if complications like:  Severe bleeding.  Perforation.  Obstruction develop. Anyone taking NSAIDs who experiences symptoms of peptic ulcer should see their caregiver for prompt treatment. Delaying diagnosis and treatment can lead to complications and the need for surgery. FOR MORE INFORMATION National Digestive Diseases Information Clearinghouse: http://digestive.EntertainmentBlogging.co.nz.htm Document Released: 06/12/2004 Document Revised: 09/29/2011 Document Reviewed: 06/22/2007 [facility] Patient Information 2013 [place], [place].

## 2013-04-14 NOTE — Assessment & Plan Note (Signed)
-  will give flu shot today. -[facility] She will call for another appointment by herself.

## 2013-04-14 NOTE — Progress Notes (Signed)
Patient ID: Sheri [name], female   DOB: 13-Feb-1951, 62 y.o.   MRN: [id number] Subjective:   Patient ID: Sheri [name] female   DOB: 1950-09-12 62 y.o.   MRN: [id number]  CC:   Acute visit due to epigastric pain HPI:  Sheri [name] is a 62 y.o. lady with past medical history as outlined below, who present for an acute visit today.  1. Epigastric pain:  Patient reports that she has been having epigastric pain in the past 3 weeks. It is aching-like discomfort. It is aggravated by eating food. It is not alleviated by any known factors. She has a history of acid reflux problem. she is currently not taking any medications for this issue. She does not have cough in the night. She does not have nausea, vomiting. No fever or chills. No diarrhea or constipation. She never had EGD procedure before. Her body weight has been stable. He does not smoke or drink alcohol. She never noticed blood in her stool. She had a negative colonoscopy on 2009.  2. Afib: Patient does not have chest pain, shortness of breath, palpitation or leg edema. Currently she is taking propranolol 10 mg when necessary. She was seen by her cardiologist, Dr. Rennis Golden on 9/11, who was okay with her current regimen. Dr. Rennis Golden suggested A. fib ablation procedure. She was given referral to Dr. Hillis Range for further evaluation. Patient is currently waiting for being evaluated. Today heart rate 78.  ROS:  Denies fever, chills, fatigue, headaches, cough, chest pain, SOB, diarrhea, constipation, dysuria, urgency, frequency, hematuria.   Past Medical History  Diagnosis Date  . Anxiety   . Depression     treated at [facility]  . Poisoning by selective serotonin reuptake inhibitors(969.03) Jan 2007    severe reaction with MAOI (nardil) and dextromethorphan  . Goiter     hx of goiter  . Allergic rhinitis   . Chronic sinusitis   . DEGENERATIVE JOINT DISEASE, HANDS     several images revealing OA   Current Outpatient Prescriptions   Medication Sig Dispense Refill  . acetaminophen (TYLENOL) 325 MG tablet Take 2 tablets (650 mg total) by mouth every 6 (six) hours as needed for pain or fever.  30 tablet  o  . albuterol (PROVENTIL HFA;VENTOLIN HFA) 108 (90 BASE) MCG/ACT inhaler Inhale 2 puffs into the lungs every 6 (six) hours as needed for wheezing or shortness of breath.  1 Inhaler  0  . ALPRAZolam (XANAX) 0.5 MG tablet Take 0.5 mg by mouth 3 (three) times daily as needed for anxiety.       . gabapentin (NEURONTIN) 300 MG capsule Take 300 mg by mouth 3 (three) times daily.        . propranolol (INDERAL) 10 MG tablet Take 1 tablet as needed for palpitations. Do not exceed 4 tablets daily.  120 tablet  11  . QUEtiapine (SEROQUEL) 25 MG tablet Take 12.5 mg by mouth at bedtime.       . traZODone (DESYREL) 100 MG tablet Take 100 mg by mouth at bedtime.      Marland Kitchen venlafaxine XR (EFFEXOR-XR) 75 MG 24 hr capsule Take 225 mg by mouth daily.       No current facility-administered medications for this visit.   Family History  Problem Relation Age of Onset  . Hypertension Mother   . Hypertension Father   . Psoriasis Father    History   Social History  . Marital Status: Widowed    Spouse  Name: N/A    Number of Children: N/A  . 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 Smoking status: Never Smoker   . Smokeless tobacco: Never Used  . Alcohol Use: No  . Drug Use: No  . Sexual Activity: None   Other Topics Concern  . None   Social History Narrative   Lives alone.    Review of Systems: Full 14-point review of systems otherwise negative. See HPI.   Objective:  Physical Exam: Filed Vitals:   04/14/13 1500  BP: 90/60  Pulse: 78  Temp: 97.6 F (36.4 C)  TempSrc: Oral  Height: 5' 5.5" (1.664 m)  Weight: 141 lb 9.6 oz (64.229 kg)  SpO2: 96%   Constitutional: Vital signs reviewed.  Patient is a well-developed and well-nourished, in no acute  distress and cooperative with exam.   HEENT:  Head: Normocephalic and atraumatic Mouth: no erythema or exudates, MMM Eyes: PERRL, EOMI, conjunctivae normal, No scleral icterus.  Neck: Supple, Trachea midline normal ROM, No JVD  Cardiovascular: RRR, S1 normal, S2 normal, no MRG, pulses symmetric and intact bilaterally Pulmonary/Chest: CTAB, no wheezes, rales, or rhonchi Abdominal: Soft. Mild tender over the epigastric area. non-distended, bowel sounds are normal, no masses, organomegaly, or guarding present. No inguinal node enlargement. Musculoskeletal: No joint deformities, erythema, or stiffness, ROM full and non-tender Extremities: No leg edema Hematology: no cervical, inginal, or axillary adenopathy.  Neurological: A&O x3, Strength is normal and symmetric bilaterally, cranial nerve II-XII are grossly intact, no focal motor deficit, sensory intact to light touch bilaterally.  Skin: Warm, dry and intact. No rash, cyanosis, or clubbing.  Psychiatric: Normal mood and affect. No suicidal or homicidal ideation.  Assessment & Plan:

## 2013-04-14 NOTE — Assessment & Plan Note (Signed)
It is stable. Her heart rate is well controlled. Today heart rate is 78 per minute. She is currently taking propranolol 10 mg when necessary. She is followed up by her cardiologist, last seen was 9/11. Patient was given referral for possible A. fib ablation procedure. She is waiting for being evaluated now. Will followup.

## 2013-04-15 NOTE — Progress Notes (Signed)
Case discussed with Dr. Niu at the time of the visit.  We reviewed the resident's history and exam and pertinent patient test results.  I agree with the assessment, diagnosis, and plan of care documented in the resident's note.    

## 2013-08-15 ENCOUNTER — Ambulatory Visit: Payer: Self-pay | Admitting: Internal Medicine

## 2013-09-07 ENCOUNTER — Ambulatory Visit: Payer: Self-pay | Admitting: Internal Medicine

## 2013-10-17 ENCOUNTER — Ambulatory Visit: Payer: Self-pay | Admitting: Internal Medicine

## 2013-11-11 ENCOUNTER — Ambulatory Visit: Payer: Self-pay | Admitting: Internal Medicine

## 2013-11-15 ENCOUNTER — Encounter: Payer: Self-pay | Admitting: Internal Medicine

## 2014-01-18 ENCOUNTER — Ambulatory Visit: Payer: Self-pay | Admitting: Internal Medicine

## 2014-01-26 ENCOUNTER — Emergency Department (INDEPENDENT_AMBULATORY_CARE_PROVIDER_SITE_OTHER)
Admission: EM | Admit: 2014-01-26 | Discharge: 2014-01-26 | Disposition: A | Discharge status: Home / Self Care | Payer: Self-pay | Source: Home / Self Care | Attending: Family Medicine | Admitting: Family Medicine

## 2014-01-26 ENCOUNTER — Encounter (HOSPITAL_COMMUNITY): Payer: Self-pay | Admitting: Emergency Medicine

## 2014-01-26 DIAGNOSIS — N764 Abscess of vulva: Secondary | ICD-10-CM

## 2014-01-26 NOTE — ED Notes (Signed)
C/o painful swelling in rectal area x 1 week; MD eval only

## 2014-01-26 NOTE — ED Provider Notes (Signed)
CSN: [id number]     Arrival date & time 01/26/14  1744 History   First MD Initiated Contact with Patient 01/26/14 1844     Chief Complaint  Patient presents with  . Groin Swelling   (Consider location/radiation/quality/duration/timing/severity/associated sxs/prior Treatment) Patient is a 63 y.o. female presenting with female genitourinary complaint. The history is provided by the patient.  Female GU Problem This is a new problem. The current episode started more than 2 days ago (right labial pain and swelling, not sexually active,). The problem has been gradually worsening. Pertinent negatives include no chest pain and no abdominal pain.    Past Medical History  Diagnosis Date  . Anxiety   . Depression     treated at [facility]  . Poisoning by selective serotonin reuptake inhibitors(969.03) Jan 2007    severe reaction with MAOI (nardil) and dextromethorphan  . Goiter     hx of goiter  . Allergic rhinitis   . Chronic sinusitis   . DEGENERATIVE JOINT DISEASE, HANDS     several images revealing OA  . Palpitations     Associated with difficulty breathing  . Tachycardia   . Panic attacks   . Atrial fibrillation with RVR    Past Surgical History  Procedure Laterality Date  . Right mastoidectomy      ET tube placement in 1980 and 1990  . Thyroidectomy, partial      nodule removal - April 1996  . Right sinus surgery  october 2000  . Ovarian cyst removal  march 1969    left ovarian cystectomy  . Appendectomy     Family History  Problem Relation Age of Onset  . Hypertension Mother   . Hypertension Father   . Psoriasis Father   . Atrial fibrillation Sister     had a successful a fib ablation  . Atrial fibrillation Sister     had a successful a fib ablation   History  Substance Use Topics  . Smoking status: Never Smoker   . Smokeless tobacco: Never Used  . Alcohol Use: No   OB History   Grav Para Term Preterm Abortions TAB SAB Ect Mult Living                  Review of Systems  Constitutional: Negative.  Negative for fever.  Cardiovascular: Negative for chest pain.  Gastrointestinal: Negative.  Negative for abdominal pain.  Genitourinary: Positive for pelvic pain.    Allergies  Guaifenesin  Home Medications   Prior to Admission medications   Medication Sig Start Date End Date Taking? Authorizing Provider  acetaminophen (TYLENOL) 325 MG tablet Take 2 tablets (650 mg total) by mouth every 6 (six) hours as needed for pain or fever. 03/24/13   Jessee Avers, MD  albuterol (PROVENTIL HFA;VENTOLIN HFA) 108 (90 BASE) MCG/ACT inhaler Inhale 2 puffs into the lungs every 6 (six) hours as needed for wheezing or shortness of breath. 01/20/13   Jerene Pitch, MD  ALPRAZolam Duanne Moron) 0.5 MG tablet Take 0.5 mg by mouth 3 (three) times daily as needed for anxiety.     Historical Provider, MD  gabapentin (NEURONTIN) 300 MG capsule Take 300 mg by mouth 3 (three) times daily.      Historical Provider, MD  propranolol (INDERAL) 10 MG tablet Take 1 tablet as needed for palpitations. Do not exceed 4 tablets daily. 03/31/13   Pixie Casino, MD  QUEtiapine (SEROQUEL) 25 MG tablet Take 12.5 mg by mouth at bedtime.  Historical Provider, MD  ranitidine (ZANTAC) 150 MG tablet Take 1 tablet (150 mg total) by mouth 2 (two) times daily. 04/14/13   Ivor Costa, MD  traZODone (DESYREL) 100 MG tablet Take 100 mg by mouth at bedtime.    Historical Provider, MD  venlafaxine XR (EFFEXOR-XR) 75 MG 24 hr capsule Take 225 mg by mouth daily.    Historical Provider, MD   BP 124/87  Pulse 90  Temp(Src) 99.1 F (37.3 C) (Oral)  Resp 16  SpO2 98% Physical Exam  Nursing note and vitals reviewed. Constitutional: She is oriented to person, place, and time. She appears well-developed and well-nourished.  Genitourinary:    There is tenderness on the right labia. There is no tenderness on the left labia.  Lymphadenopathy:       Right: Inguinal adenopathy present.  Neurological:  She is alert and oriented to person, place, and time.  Skin: Skin is warm and dry.    ED Course  Procedures (including critical care time) Labs Review Labs Reviewed - No data to display  Imaging Review No results found.   MDM   1. Labial abscess        Billy Fischer, MD 01/26/14 Drema Halon

## 2014-01-26 NOTE — Discharge Instructions (Signed)
[facility]

## 2014-01-27 ENCOUNTER — Inpatient Hospital Stay (HOSPITAL_COMMUNITY)
Admission: AD | Admit: 2014-01-27 | Discharge: 2014-01-27 | Disposition: A | Discharge status: Home / Self Care | Payer: No Typology Code available for payment source | Source: Ambulatory Visit | Attending: Obstetrics & Gynecology | Admitting: Obstetrics & Gynecology

## 2014-01-27 ENCOUNTER — Encounter (HOSPITAL_COMMUNITY): Payer: Self-pay | Admitting: *Deleted

## 2014-01-27 DIAGNOSIS — I4891 Unspecified atrial fibrillation: Secondary | ICD-10-CM | POA: Insufficient documentation

## 2014-01-27 DIAGNOSIS — N764 Abscess of vulva: Secondary | ICD-10-CM

## 2014-01-27 DIAGNOSIS — F3289 Other specified depressive episodes: Secondary | ICD-10-CM | POA: Insufficient documentation

## 2014-01-27 DIAGNOSIS — E049 Nontoxic goiter, unspecified: Secondary | ICD-10-CM | POA: Insufficient documentation

## 2014-01-27 DIAGNOSIS — F329 Major depressive disorder, single episode, unspecified: Secondary | ICD-10-CM | POA: Insufficient documentation

## 2014-01-27 MED ORDER — METRONIDAZOLE 500 MG PO TABS
500.0000 mg | ORAL_TABLET | Freq: Two times a day (BID) | ORAL | Status: DC
Start: 2014-01-27 — End: 2014-06-12

## 2014-01-27 MED ORDER — SULFAMETHOXAZOLE-TRIMETHOPRIM 800-160 MG PO TABS: 1.0000 | ORAL_TABLET | Freq: Two times a day (BID) | ORAL | Status: AC

## 2014-01-27 NOTE — MAU Provider Note (Signed)
Attestation of Attending Supervision of Advanced Practitioner (PA/CNM/NP): Evaluation and management procedures were performed by the Advanced Practitioner under my supervision and collaboration.  I have reviewed the Advanced Practitioner's note and chart, and I agree with the management and plan.  Veleka Djordjevic, MD, FACOG Attending Obstetrician & Gynecologist Faculty Practice, [facility]   

## 2014-01-27 NOTE — MAU Provider Note (Signed)
History     CSN: [id number]  Arrival date and time: 01/27/14 1741   None     Chief Complaint  Patient presents with  . Abscess   HPI Sheri OVERALL is 63 y.o.  presenting for evaluation of right labial abscess.  She was seen yesterday at [facility], dx with labial abscess.  She was instructed to come here but was too tired last night to come.   Returns today for evaluation.  States it is a little smaller but "still bad".  Began 1 weeks ago.  Denies draining.  Painful and red.  She states she get bumps on different places over there body but resolve with swelling.      Past Medical History  Diagnosis Date  . Anxiety   . Depression     treated at [place] [facility]  . Poisoning by selective serotonin reuptake inhibitors(969.03) Jan 2007    severe reaction with MAOI (nardil) and dextromethorphan  . Goiter     hx of goiter  . Allergic rhinitis   . Chronic sinusitis   . DEGENERATIVE JOINT DISEASE, HANDS     several images revealing OA  . Palpitations     Associated with difficulty breathing  . Tachycardia   . Panic attacks   . Atrial fibrillation with RVR     Past Surgical History  Procedure Laterality Date  . Right mastoidectomy      ET tube placement in 1980 and 1990  . Thyroidectomy, partial      nodule removal - April 1996  . Right sinus surgery  october 2000  . Ovarian cyst removal  march 1969    left ovarian cystectomy  . Appendectomy      Family History  Problem Relation Age of Onset  . Hypertension Mother   . Hypertension Father   . Psoriasis Father   . Atrial fibrillation Sister     had a successful a fib ablation  . Atrial fibrillation Sister     had a successful a fib ablation    History  Substance Use Topics  . Smoking status: Never Smoker   . Smokeless tobacco: Never Used  . Alcohol Use: No    Allergies:  Allergies  Allergen Reactions  . Guaifenesin Palpitations    Prescriptions prior to admission  Medication Sig Dispense Refill  .  acetaminophen (TYLENOL) 325 MG tablet Take 2 tablets (650 mg total) by mouth every 6 (six) hours as needed for pain or fever.  30 tablet  o  . albuterol (PROVENTIL HFA;VENTOLIN HFA) 108 (90 BASE) MCG/ACT inhaler Inhale 2 puffs into the lungs every 6 (six) hours as needed for wheezing or shortness of breath.  1 Inhaler  0  . ALPRAZolam (XANAX) 0.5 MG tablet Take 0.5 mg by mouth 3 (three) times daily as needed for anxiety.       . gabapentin (NEURONTIN) 300 MG capsule Take 300 mg by mouth 3 (three) times daily.        . propranolol (INDERAL) 10 MG tablet Take 1 tablet as needed for palpitations. Do not exceed 4 tablets daily.  120 tablet  11  . QUEtiapine (SEROQUEL) 25 MG tablet Take 12.5 mg by mouth at bedtime.       . ranitidine (ZANTAC) 150 MG tablet Take 1 tablet (150 mg total) by mouth 2 (two) times daily.  60 tablet  1  . traZODone (DESYREL) 100 MG tablet Take 100 mg by mouth at bedtime.      Marland Kitchen venlafaxine  XR (EFFEXOR-XR) 75 MG 24 hr capsule Take 225 mg by mouth daily.        Review of Systems  Constitutional: Negative for fever and chills.  Genitourinary:       Labial redness, swelling  and pain.   Physical Exam   Blood pressure 122/79, pulse 80, temperature 98.6 F (37 C), temperature source Oral, resp. rate 18.  Physical Exam  Constitutional: She is oriented to person, place, and time. She appears well-developed and well-nourished. No distress.  HENT:  Head: Normocephalic.  Neck: Normal range of motion.  Cardiovascular: Normal rate.   Respiratory: Effort normal.  GI: Soft. She exhibits no distension and no mass. There is no tenderness. There is no rebound and no guarding.  Genitourinary:    There is tenderness on the right labia. There is no rash, tenderness, lesion or injury on the left labia.  Lymphadenopathy:       Right: Inguinal adenopathy present.       Left: No inguinal adenopathy present.  Neurological: She is alert and oriented to person, place, and time.    Psychiatric: She has a normal mood and affect. Her behavior is normal.    MAU Course  Procedures  MDM Dr. Harolyn Rutherford in to see patient--orders given for antibiotics and sitz baths.  F/U in [place] next Friday  Assessment and Plan  A;  Right Labial Abscess  P:  Rx for Bactrim DS and Flagyl bid for 10 days      Sitz baths 3-4 X day      Return for worsening sxs      Follow up in the [place] 7/17 at [place] 01/27/2014, 6:11 PM

## 2014-01-27 NOTE — MAU Note (Signed)
Rt labial abscess noted over a wk.  Has had problems for over a yr, - but are usually just small bumps- nothing this bad,- right now the swelling is all the way back to the rectum currently has a bump on left leg also.  Has "scratched the head off and they swell up", some time has pus coming out.

## 2014-01-27 NOTE — MAU Note (Signed)
Pt received discharged instructions.

## 2014-01-27 NOTE — Discharge Instructions (Signed)
Abscess °An abscess (boil or furuncle) is an infected area on or under the skin. This area is filled with yellowish-white fluid (pus) and other material (debris). °HOME CARE  °· Only take medicines as told by your doctor. °· If you were given antibiotic medicine, take it as directed. Finish the medicine even if you start to feel better. °· If gauze is used, follow your doctor's directions for changing the gauze. °· To avoid spreading the infection: °¨ Keep your abscess covered with a bandage. °¨ Wash your hands well. °¨ Do not share personal care items, towels, or whirlpools with others. °¨ Avoid skin contact with others. °· Keep your skin and clothes clean around the abscess. °· Keep all doctor visits as told. °GET HELP RIGHT AWAY IF:  °· You have more pain, puffiness (swelling), or redness in the wound site. °· You have more fluid or blood coming from the wound site. °· You have muscle aches, chills, or you feel sick. °· You have a fever. °MAKE SURE YOU:  °· Understand these instructions. °· Will watch your condition. °· Will get help right away if you are not doing well or get worse. °Document Released: 12/24/2007 Document Revised: 01/06/2012 Document Reviewed: 09/19/2011 °ExitCare® Patient Information ©2015 [employer]. This information is not intended to replace advice given to you by your health care provider. Make sure you discuss any questions you have with your health care provider. ° °

## 2014-02-03 ENCOUNTER — Telehealth: Payer: Self-pay | Admitting: General Practice

## 2014-02-03 ENCOUNTER — Encounter: Payer: Self-pay | Admitting: Obstetrics and Gynecology

## 2014-02-03 NOTE — Telephone Encounter (Signed)
Patient no showed for work in appt. Called patient and she states she knows she missed her appt today and would like it rescheduled. Patient states she works everyday from 10-5 so the appt would have to be before then. Told patient I will let our front office staff know and they will call her with a new appt. Patient had no questions.

## 2014-02-10 ENCOUNTER — Encounter: Payer: Self-pay | Admitting: Obstetrics & Gynecology

## 2014-02-13 ENCOUNTER — Ambulatory Visit: Payer: Self-pay | Admitting: Internal Medicine

## 2014-03-02 ENCOUNTER — Encounter: Payer: Self-pay | Admitting: Obstetrics & Gynecology

## 2014-03-08 ENCOUNTER — Ambulatory Visit: Payer: Self-pay | Admitting: Internal Medicine

## 2014-03-09 ENCOUNTER — Encounter: Payer: Self-pay | Admitting: Internal Medicine

## 2014-05-22 ENCOUNTER — Encounter (HOSPITAL_COMMUNITY): Payer: Self-pay | Admitting: *Deleted

## 2014-06-12 ENCOUNTER — Encounter: Payer: Self-pay | Admitting: Internal Medicine

## 2014-06-12 ENCOUNTER — Ambulatory Visit (INDEPENDENT_AMBULATORY_CARE_PROVIDER_SITE_OTHER): Payer: Self-pay | Admitting: Internal Medicine

## 2014-06-12 VITALS — BP 124/62 | HR 92 | Ht 65.5 in | Wt 148.3 lb

## 2014-06-12 DIAGNOSIS — F419 Anxiety disorder, unspecified: Secondary | ICD-10-CM

## 2014-06-12 DIAGNOSIS — I4891 Unspecified atrial fibrillation: Secondary | ICD-10-CM

## 2014-06-12 DIAGNOSIS — Z8679 Personal history of other diseases of the circulatory system: Secondary | ICD-10-CM

## 2014-06-12 MED ORDER — PROPRANOLOL HCL ER 60 MG PO CP24: 60.0000 mg | ORAL_CAPSULE | Freq: Every day | ORAL | Status: DC

## 2014-06-12 NOTE — Patient Instructions (Addendum)
Your physician has recommended you make the following change in your medication.Marland Kitchen  STOP the short acting proprabolol (10mg )  >>>> And START propranolol 60mg  once daily (long acting)  Your physician wants you to follow-up in: 1 year with Dr. Debara Pickett. You will receive a reminder letter in the mail two months in advance. If you don't receive a letter, please call our office to schedule the follow-up appointment.

## 2014-06-12 NOTE — Addendum Note (Signed)
Addended by: Fidel Levy on: 06/12/2014 05:38 PM   Modules accepted: Medications

## 2014-06-12 NOTE — Progress Notes (Signed)
OFFICE NOTE  Chief Complaint:  Palpitations  Primary Care Physician: Pcp Not In System  HPI:  Sheri [name] is a 63 year female who was recently seen in the hospital for palpitations associated with some difficulty with breathing. She attempted an inhaler and her propranolol X 3 (she takes intermittently for fast heart rate, she took a total of 60mg ). She further tried valsalva maneuver. She did not have success getting her heart rate down. She presented to [facility] when the issue began, however, they gave her medicine and did an EKG and sent her home. When the issue persisted, she presented to [facility] after taking 20 mg more of propranolol . She reported a history of palpitations for which she takes prn propranolol (20mg ) (noted as supposed to be taking 1/2 pill - 10mg  in chart BID had not taken regularly for 2-3 months and this was her 3rd episode of tachycardia) and attempts valsalva. These epidsodes of tachycardia started around the time she was 62 years old. Dr. Kelton Pillar was her cardiologist at the time. She has had to be evaluated in the hospital in the past for these issues but is occasionally able to control them at home. She has had increased social stressors recently as her niece died. Further associated symptom was diarrhea while she was trying to valsalva, this is normal for her with the tachycardia. She has a history of thyroid surgery, she is not treated with synthroid. She further denies a history of DVT, heart disease or any other medical problems. She is treated for anxiety and panic attacks with seroquel, trazadone, effexor and xanax. She has been on stable doses of these medications by report. In [place] she was in A fib with RVR she was placed on procainamide drip. She then converted to SR. She had echo done which was generally normal. She then spontaneously converted to sinus rhythm. She does report 2 of her sisters with the same disorder - they have both undergone a-fib  ablations which have successfully terminated their arrhythmias.  Sheri [name] returns for follow-up. She reports some palpitations at least once or twice a week. She is taking short acting propranolol for this, but it is unclear why she is not on daily dosing given the frequency of her symptoms. This seems to help with her tachycardia however is not clear whether the episodes are self-limited or not. I had referred her to Dr. Rayann Heman, however due to lack of insurance that referral was never made.  PMHx:  Past Medical History  Diagnosis Date  . Anxiety   . Depression     treated at [place] [facility]  . Poisoning by selective serotonin reuptake inhibitors(969.03) Jan 2007    severe reaction with MAOI (nardil) and dextromethorphan  . Goiter     hx of goiter  . Allergic rhinitis   . Chronic sinusitis   . DEGENERATIVE JOINT DISEASE, HANDS     several images revealing OA  . Palpitations     Associated with difficulty breathing  . Tachycardia   . Panic attacks   . Atrial fibrillation with RVR     Past Surgical History  Procedure Laterality Date  . Right mastoidectomy      ET tube placement in 1980 and 1990  . Thyroidectomy, partial      nodule removal - April 1996  . Right sinus surgery  october 2000  . Ovarian cyst removal  march 1969    left ovarian cystectomy  . Appendectomy  FAMHx:  Family History  Problem Relation Age of Onset  . Hypertension Mother   . Hypertension Father   . Psoriasis Father   . Atrial fibrillation Sister     had a successful a fib ablation  . Atrial fibrillation Sister     had a successful a fib ablation    SOCHx:   reports that she has never smoked. She has never used smokeless tobacco. She reports that she does not drink alcohol or use illicit drugs.  ALLERGIES:  Allergies  Allergen Reactions  . Guaifenesin Palpitations    ROS: A comprehensive review of systems was negative except for: Cardiovascular: positive for palpitations  HOME  MEDS: Current Outpatient Prescriptions  Medication Sig Dispense Refill  . albuterol (PROVENTIL HFA;VENTOLIN HFA) 108 (90 BASE) MCG/ACT inhaler Inhale 2 puffs into the lungs every 6 (six) hours as needed for wheezing or shortness of breath. 1 Inhaler 0  . ALPRAZolam (XANAX) 0.5 MG tablet Take 0.5 mg by mouth 3 (three) times daily as needed for anxiety.     . gabapentin (NEURONTIN) 300 MG capsule Take 300 mg by mouth 3 (three) times daily.      . QUEtiapine (SEROQUEL) 25 MG tablet Take 12.5 mg by mouth at bedtime.     . traZODone (DESYREL) 100 MG tablet Take 100 mg by mouth at bedtime.    Marland Kitchen venlafaxine XR (EFFEXOR-XR) 75 MG 24 hr capsule Take 75 mg by mouth 2 (two) times daily. Patient takes one tablet in the morning and one at lunch time.    . propranolol ER (INDERAL LA) 60 MG 24 hr capsule Take 1 capsule (60 mg total) by mouth daily. 30 capsule 11   No current facility-administered medications for this visit.    LABS/IMAGING: No results found for this or any previous visit (from the past 48 hour(s)). No results found.  VITALS: BP 124/62 mmHg  Pulse 92  Ht 5' 5.5" (1.664 m)  Wt 148 lb 4.8 oz (67.268 kg)  BMI 24.29 kg/m2  EXAM: General appearance: alert and no distress Neck: no adenopathy, no carotid bruit, no JVD, supple, symmetrical, trachea midline and thyroid not enlarged, symmetric, no tenderness/mass/nodules Lungs: clear to auscultation bilaterally Heart: regular rate and rhythm, S1, S2 normal, no murmur, click, rub or gallop Abdomen: soft, non-tender; bowel sounds normal; no masses,  no organomegaly Extremities: extremities normal, atraumatic, no cyanosis or edema Pulses: 2+ and symmetric Skin: Skin color, texture, turgor normal. No rashes or lesions Neurologic: Grossly normal  EKG: Normal sinus rhythm at 92  ASSESSMENT: 1. Paroxysmal atrial fibrillation - not on anticoagulation 2. Family history of arrhythmia with 2 sisters who have had AF ablation  PLAN: 1.   Mrs.  [name] has infrequent but worsening episodes of atrial fibrillation that are short lived. It is unclear whether she has tried antiarrhythmic therapy before and failed, however her pocket pill strategy does not seem to be working. She reports significant fatigue on beta blockers, possibly because she is thin and has a low blood pressure at baseline. She is having more frequent episodes of palpitations and probably would benefit from a low-dose of beta blocker to help prevent her symptoms. She is agreeable to trying low-dose propranolol daily. I've also recommended that she take low-dose aspirin to reduce stroke risk. 81 mg daily. We have referred her to Dr. Rayann Heman, however she does not have health insurance and was unable to make that visit. Based on her family history, she may be a good candidate for ablation.  Pixie Casino, MD, [facility] Attending Cardiologist [facility] 06/12/2014, 12:06 PM

## 2014-06-22 ENCOUNTER — Other Ambulatory Visit: Payer: Self-pay | Admitting: *Deleted

## 2014-06-22 ENCOUNTER — Telehealth: Payer: Self-pay | Admitting: Internal Medicine

## 2014-06-22 MED ORDER — PROPRANOLOL HCL ER 60 MG PO CP24: 60.0000 mg | ORAL_CAPSULE | Freq: Every day | ORAL | Status: DC

## 2014-06-22 NOTE — Telephone Encounter (Signed)
Called pt, stated she could not afford new med prescribed so wanted to continue propanolol. Saw Dr. Debara Pickett 11/23 and stated he had recommended it was fine to continue propanolol if cost was a concern for the new medication. Refilled Rx for propanolol in 90 day supply for pt preference.

## 2014-06-22 NOTE — Telephone Encounter (Signed)
Pt called in stating that Dr. Debara Pickett called in a new prescription for her last week and it came to $60. She can not afford that and would like to continue taking the propranolol. Please call  Thanks  I informed her that the the propranolol was called to the pharmacy on 11/23 but she stated that the pharmacy did not have it.

## 2014-06-23 ENCOUNTER — Telehealth: Payer: Self-pay | Admitting: Internal Medicine

## 2014-06-23 MED ORDER — PROPRANOLOL HCL 10 MG PO TABS: 10.0000 mg | ORAL_TABLET | Freq: Three times a day (TID) | ORAL | Status: DC

## 2014-06-23 MED ORDER — PROPRANOLOL HCL 10 MG PO TABS: 10.0000 mg | ORAL_TABLET | Freq: Every day | ORAL | Status: DC

## 2014-06-23 NOTE — Telephone Encounter (Signed)
Ok to switch her back to the short acting medication.  Dr Lemmie Evens

## 2014-06-23 NOTE — Telephone Encounter (Signed)
Got OK from Dr. Debara Pickett, med changed and patient notified that it has been sent to pharmacy.

## 2014-06-23 NOTE — Telephone Encounter (Signed)
Pt was seen on 11/23. Dr. Debara Pickett recommended change from propanolol 10mg  to long acting propanolol ER 60mg . Pt called and wants to switch back to 10mg  dosage due to cost/lack of insurance. Please advise.

## 2014-06-23 NOTE — Telephone Encounter (Signed)
Please call pt asap,mix up in her medicine.

## 2014-07-08 ENCOUNTER — Encounter (HOSPITAL_COMMUNITY): Payer: Self-pay | Admitting: Emergency Medicine

## 2014-07-08 ENCOUNTER — Emergency Department (INDEPENDENT_AMBULATORY_CARE_PROVIDER_SITE_OTHER)
Admission: EM | Admit: 2014-07-08 | Discharge: 2014-07-08 | Disposition: A | Discharge status: Home / Self Care | Payer: Self-pay | Source: Home / Self Care | Attending: Emergency Medicine | Admitting: Emergency Medicine

## 2014-07-08 DIAGNOSIS — K047 Periapical abscess without sinus: Secondary | ICD-10-CM

## 2014-07-08 MED ORDER — NAPROXEN 500 MG PO TABS: 500.0000 mg | ORAL_TABLET | Freq: Two times a day (BID) | ORAL | Status: DC

## 2014-07-08 MED ORDER — AMOXICILLIN 500 MG PO CAPS: 500.0000 mg | ORAL_CAPSULE | Freq: Three times a day (TID) | ORAL | Status: DC

## 2014-07-08 MED ORDER — HYDROCODONE-ACETAMINOPHEN 5-325 MG PO TABS: ORAL_TABLET | ORAL | Status: DC

## 2014-07-08 NOTE — ED Provider Notes (Signed)
   Chief Complaint   Dental Pain   History of Present Illness   Sheri [name] is a 63 year old female with overall widespread dental decay. The past 3 days she's had pain in a right upper tooth and she has many missing teeth, so is hard to say which tooth is his. It's been filled limits cracked. She notes pain and swelling of the gingiva such that she cannot wear partial. Hurts to chew on that side. No difficulty swallowing or breathing. She denies any fever, headache, neck pain, swelling, chest pain, or shortness of breath.  Review of Systems   Other than as noted above, the patient denies any of the following symptoms: Systemic:  No fever or chills. ENT:  No headache, ear ache, sore throat, nasal congestion, facial pain, or swelling. Neck:  No adenopathy or neck swelling. Lungs:  No coughing or shortness of breath.  [place]   Past medical history, family history, social history, meds, and allergies were reviewed. She is allergic to guaifenesin. She has anxiety and depression. She takes Neurontin, Effexor, and alprazolam.  Physical Examination     Vital signs:  BP 118/78 mmHg  Pulse 99  Temp(Src) 99 F (37.2 C) (Oral)  Resp 16  SpO2 97% General:  Alert, oriented, in no distress. ENT:  TMs and canals normal.  Nasal mucosa normal. Mouth exam:  She is widespread dental decay with multiple missing teeth. She has a tooth and her right upper gingiva which appears to be a premolar molar, it's hard to say, since there no other teeth there. The tooth has been filled and is cracked. There is pain to palpation and surrounding inflammation of the gingiva. There is no visible collection of pus. No swelling of the time of the floor the mouth. The pharynx is clear. Neck:  No swelling or adenopathy. Lungs:  Breath sounds clear and equal bilaterally.  No wheezes, rales or rhonchi. Heart:  Regular rhythm.  No gallops or murmers. Skin:  Clear, warm and dry.   Assessment   The encounter diagnosis  was Dental infection.  No evidence of Ludwig's angina.    Plan   1.  Meds:  The following meds were prescribed:   Discharge Medication List as of 07/08/2014  1:28 PM    START taking these medications   Details  amoxicillin (AMOXIL) 500 MG capsule Take 1 capsule (500 mg total) by mouth 3 (three) times daily., Starting 07/08/2014, Until Discontinued, Normal    HYDROcodone-acetaminophen (NORCO/VICODIN) 5-325 MG per tablet 1 to 2 tabs every 4 to 6 hours as needed for pain., Print    naproxen (NAPROSYN) 500 MG tablet Take 1 tablet (500 mg total) by mouth 2 (two) times daily., Starting 07/08/2014, Until Discontinued, Normal        2.  Patient Education/Counseling:  The patient was given appropriate handouts, self care instructions, and instructed in pain control. Suggested sleeping with head of bed elevated and hot salt water mouthwash.   3.  Follow up:  The patient was told to follow up if no better in 3 to 4 days, if becoming worse in any way, and given some red flag symptoms such as difficulty swallowing or breathing which would prompt immediate return.  Follow up with a dentist as soon as posssible.     Harden Mo, MD 07/08/14 [phone]

## 2014-07-08 NOTE — Discharge Instructions (Signed)
Look up the [place] [facility] for free dental [email or web address]. [email or web address]  Get there early and be prepared to wait. [facility] and [employer] have [employer] schools that provide low cost routine dental care.   Other resources: [facility] [place], [place] [phone]  Patients with Medicaid: [place] W. [facility] Phone:  [phone]                                                  Phone:  [phone]  Dr. Ardyth Harps [address] [phone]  If unable to pay or uninsured, contact:  [facility]. to become qualified for the adult dental clinic.  No matter what dental problem you have, it will not get better unless you get good dental care.  If the tooth is not taken care of, your symptoms will come back in time and you will be visiting [place] again in the [place] with a bad toothache.  So, see your dentist as soon as possible.  If you don't have a dentist, we can give you a list of dentists.  Sometimes the most cost effective treatment is removal of the tooth.  This can be done very inexpensively through one of the low cost Dance movement psychotherapist such as the facility on [facility] in [place] [phone]).  The downside to this is that you will have one less tooth and this can effect your ability to chew.  Some other things that can be done for a dental infection include the following:   Rinse your mouth out with hot salt water (1/2 tsp of table salt and a pinch of baking soda in 8 oz of hot water).  You can do this every 2 or 3 hours.  Avoid cold foods, beverages, and cold air.  This will make your symptoms worse.  Sleep with your head elevated.  Sleeping flat will cause your gums and oral tissues to swell and make them hurt  more.  You can sleep on several pillows.  Even better is to sleep in a recliner with your head higher than your heart.  For mild to moderate pain, you can take Tylenol, ibuprofen, or Aleve.  External application of heat by a heating pad, hot water bottle, or hot wet towel can help with pain and speed healing.  You can do this every 2 to 3 hours. Do not fall asleep on a heating pad since this can cause a burn.

## 2014-07-08 NOTE — ED Notes (Signed)
C/o  Dental pain with gum swelling.  On set 3 days ago.  Pt has tried ambi sol and oral gel with no relief.

## 2014-07-25 ENCOUNTER — Encounter (HOSPITAL_COMMUNITY): Payer: Self-pay | Admitting: Emergency Medicine

## 2014-07-25 ENCOUNTER — Emergency Department (INDEPENDENT_AMBULATORY_CARE_PROVIDER_SITE_OTHER)
Admission: EM | Admit: 2014-07-25 | Discharge: 2014-07-25 | Disposition: A | Discharge status: Home / Self Care | Payer: Self-pay | Source: Home / Self Care | Attending: Emergency Medicine | Admitting: Emergency Medicine

## 2014-07-25 DIAGNOSIS — E860 Dehydration: Secondary | ICD-10-CM

## 2014-07-25 DIAGNOSIS — J40 Bronchitis, not specified as acute or chronic: Secondary | ICD-10-CM

## 2014-07-25 DIAGNOSIS — E876 Hypokalemia: Secondary | ICD-10-CM

## 2014-07-25 DIAGNOSIS — A084 Viral intestinal infection, unspecified: Secondary | ICD-10-CM

## 2014-07-25 LAB — POCT I-STAT, CHEM 8
BUN: 11 mg/dL (ref 6–23)
CALCIUM ION: 1.17 mmol/L (ref 1.13–1.30)
CHLORIDE: 108 meq/L (ref 96–112)
CREATININE: 0.6 mg/dL (ref 0.50–1.10)
Glucose, Bld: 92 mg/dL (ref 70–99)
HEMATOCRIT: 41 % (ref 36.0–46.0)
Hemoglobin: 13.9 g/dL (ref 12.0–15.0)
Potassium: 3.6 mmol/L (ref 3.5–5.1)
Sodium: 142 mmol/L (ref 135–145)
TCO2: 22 mmol/L (ref 0–100)

## 2014-07-25 LAB — POCT URINALYSIS DIP (DEVICE)
BILIRUBIN URINE: NEGATIVE
Glucose, UA: NEGATIVE mg/dL
Ketones, ur: NEGATIVE mg/dL
LEUKOCYTES UA: NEGATIVE
NITRITE: NEGATIVE
Protein, ur: NEGATIVE mg/dL
Specific Gravity, Urine: >=1.03 (ref 1.005–1.030)
Urobilinogen, UA: 0.2 mg/dL (ref 0.0–1.0)
pH: 5.5 (ref 5.0–8.0)

## 2014-07-25 MED ORDER — POTASSIUM CHLORIDE CRYS ER 20 MEQ PO TBCR
40.0000 meq | EXTENDED_RELEASE_TABLET | Freq: Once | ORAL | Status: AC
  Administered 2014-07-25: 40 meq via ORAL

## 2014-07-25 MED ORDER — PROMETHAZINE-DM 6.25-15 MG/5ML PO SYRP: 5.0000 mL | ORAL_SOLUTION | Freq: Four times a day (QID) | ORAL | Status: DC | PRN

## 2014-07-25 MED ORDER — ONDANSETRON HCL 4 MG/2ML IJ SOLN
4.0000 mg | Freq: Once | INTRAMUSCULAR | Status: AC
  Administered 2014-07-25: 4 mg via INTRAVENOUS

## 2014-07-25 MED ORDER — ONDANSETRON HCL 4 MG/2ML IJ SOLN
INTRAMUSCULAR | Status: AC
  Filled 2014-07-25: qty 2

## 2014-07-25 MED ORDER — ALBUTEROL SULFATE HFA 108 (90 BASE) MCG/ACT IN AERS
INHALATION_SPRAY | RESPIRATORY_TRACT | Status: AC
  Filled 2014-07-25: qty 6.7

## 2014-07-25 MED ORDER — ALBUTEROL SULFATE HFA 108 (90 BASE) MCG/ACT IN AERS: 1.0000 | INHALATION_SPRAY | RESPIRATORY_TRACT | Status: DC | PRN

## 2014-07-25 MED ORDER — AMOXICILLIN 500 MG PO CAPS: 500.0000 mg | ORAL_CAPSULE | Freq: Three times a day (TID) | ORAL | Status: DC

## 2014-07-25 MED ORDER — POTASSIUM CHLORIDE CRYS ER 20 MEQ PO TBCR
EXTENDED_RELEASE_TABLET | ORAL | Status: AC
  Filled 2014-07-25: qty 2

## 2014-07-25 MED ORDER — SODIUM CHLORIDE 0.9 % IV BOLUS (SEPSIS)
1000.0000 mL | Freq: Once | INTRAVENOUS | Status: AC
  Administered 2014-07-25: 1000 mL via INTRAVENOUS

## 2014-07-25 MED ORDER — SODIUM CHLORIDE 0.9 % IJ SOLN
INTRAMUSCULAR | Status: AC
  Filled 2014-07-25: qty 3

## 2014-07-25 MED ORDER — ONDANSETRON HCL 4 MG/2ML IJ SOLN: 4.0000 mg | Freq: Once | INTRAMUSCULAR | Status: DC

## 2014-07-25 NOTE — ED Notes (Signed)
Reports 3 days ago symptoms started with a productive cough with yellow sputum and loss of voice.  Pt c/o having on set of vomiting and diarrhea last night with a low grade temp.  No relief with otc meds.

## 2014-07-25 NOTE — Discharge Instructions (Signed)
Please review the instructions below. Take the antibiotic as directed. Use the inhaler as directed for shortness of breath, wheezing or cough. Cough med as directed for cough especially at bedtime. Frequent sips of fluid. Once vomiting has resolved yogurt may help with diarrhea. Continue medication for diarrhea as directed. If symptom sdo not improve arrange follow up at the [facility].   Food Choices to Help Relieve Diarrhea When you have diarrhea, the foods you eat and your eating habits are very important. Choosing the right foods and drinks can help relieve diarrhea. Also, because diarrhea can last up to 7 days, you need to replace lost fluids and electrolytes (such as sodium, potassium, and chloride) in order to help prevent dehydration.  WHAT GENERAL GUIDELINES DO I NEED TO FOLLOW?  Slowly drink 1 cup (8 oz) of fluid for each episode of diarrhea. If you are getting enough fluid, your urine will be clear or pale yellow.  Eat starchy foods. Some good choices include white rice, white toast, pasta, low-fiber cereal, baked potatoes (without the skin), saltine crackers, and bagels.  Avoid large servings of any cooked vegetables.  Limit fruit to two servings per day. A serving is  cup or 1 small piece.  Choose foods with less than 2 g of fiber per serving.  Limit fats to less than 8 tsp (38 g) per day.  Avoid fried foods.  Eat foods that have probiotics in them. Probiotics can be found in certain dairy products.  Avoid foods and beverages that may increase the speed at which food moves through the stomach and intestines (gastrointestinal tract). Things to avoid include:  High-fiber foods, such as dried fruit, raw fruits and vegetables, nuts, seeds, and whole grain foods.  Spicy foods and high-fat foods.  Foods and beverages sweetened with high-fructose corn syrup, honey, or sugar alcohols such as xylitol, sorbitol, and mannitol. WHAT FOODS ARE RECOMMENDED? Grains White rice.  White, [place], or pita breads (fresh or toasted), including plain rolls, buns, or bagels. White pasta. Saltine, soda, or graham crackers. Pretzels. Low-fiber cereal. Cooked cereals made with water (such as cornmeal, farina, or cream cereals). Plain muffins. Matzo. Melba toast. Zwieback.  Vegetables Potatoes (without the skin). Strained tomato and vegetable juices. Most well-cooked and canned vegetables without seeds. Tender lettuce. Fruits Cooked or canned applesauce, apricots, cherries, fruit cocktail, grapefruit, peaches, pears, or plums. Fresh bananas, apples without skin, cherries, grapes, cantaloupe, grapefruit, peaches, oranges, or plums.  Meat and Other Protein Products Baked or boiled chicken. Eggs. Tofu. Fish. Seafood. Smooth peanut butter. Ground or well-cooked tender beef, ham, veal, lamb, pork, or poultry.  Dairy Plain yogurt, kefir, and unsweetened liquid yogurt. Lactose-free milk, buttermilk, or soy milk. Plain hard cheese. Beverages Sport drinks. Clear broths. Diluted fruit juices (except prune). Regular, caffeine-free sodas such as ginger ale. Water. Decaffeinated teas. Oral rehydration solutions. Sugar-free beverages not sweetened with sugar alcohols. Other Bouillon, broth, or soups made from recommended foods.  The items listed above may not be a complete list of recommended foods or beverages. Contact your dietitian for more options. WHAT FOODS ARE NOT RECOMMENDED? Grains Whole grain, whole wheat, bran, or rye breads, rolls, pastas, crackers, and cereals. Wild or brown rice. Cereals that contain more than 2 g of fiber per serving. Corn tortillas or taco shells. Cooked or dry oatmeal. Granola. Popcorn. Vegetables Raw vegetables. Cabbage, broccoli, Brussels sprouts, artichokes, baked beans, beet greens, corn, kale, legumes, peas, sweet potatoes, and yams. Potato skins. Cooked spinach and cabbage. Fruits Dried fruit, including  raisins and dates. Raw fruits. Stewed or dried prunes.  Fresh apples with skin, apricots, mangoes, pears, raspberries, and strawberries.  Meat and Other Protein Products Chunky peanut butter. Nuts and seeds. Beans and lentils. Sheri [name].  Dairy High-fat cheeses. Milk, chocolate milk, and beverages made with milk, such as milk shakes. Cream. Ice cream. Sweets and Desserts Sweet rolls, doughnuts, and sweet breads. Pancakes and waffles. Fats and Oils Butter. Cream sauces. Margarine. Salad oils. Plain salad dressings. Olives. Avocados.  Beverages Caffeinated beverages (such as coffee, tea, soda, or energy drinks). Alcoholic beverages. Fruit juices with pulp. Prune juice. Soft drinks sweetened with high-fructose corn syrup or sugar alcohols. Other Coconut. Hot sauce. Chili powder. Mayonnaise. Gravy. Cream-based or milk-based soups.  The items listed above may not be a complete list of foods and beverages to avoid. Contact your dietitian for more information. WHAT SHOULD I DO IF I BECOME DEHYDRATED? Diarrhea can sometimes lead to dehydration. Signs of dehydration include dark urine and dry mouth and skin. If you think you are dehydrated, you should rehydrate with an oral rehydration solution. These solutions can be purchased at pharmacies, retail stores, or online.  Drink -1 cup (120-240 mL) of oral rehydration solution each time you have an episode of diarrhea. If drinking this amount makes your diarrhea worse, try drinking smaller amounts more often. For example, drink 1-3 tsp (5-15 mL) every 5-10 minutes.  A general rule for staying hydrated is to drink 1-2 L of fluid per day. Talk to your health care provider about the specific amount you should be drinking each day. Drink enough fluids to keep your urine clear or pale yellow. Document Released: 09/27/2003 Document Revised: 07/12/2013 Document Reviewed: 05/30/2013 [facility] Patient Information 2015 [place], [place]. This information is not intended to replace advice given to you by your health care provider.  Make sure you discuss any questions you have with your health care provider.  How to Use an Inhaler Using your inhaler correctly is very important. Good technique will make sure that the medicine reaches your lungs.  HOW TO USE AN INHALER:  Take the cap off the inhaler.  If this is the first time using your inhaler, you need to prime it. Shake the inhaler for 5 seconds. Release four puffs into the air, away from your face. Ask your doctor for help if you have questions.  Shake the inhaler for 5 seconds.  Turn the inhaler so the bottle is above the mouthpiece.  Put your pointer finger on top of the bottle. Your thumb holds the bottom of the inhaler.  Open your mouth.  Either hold the inhaler away from your mouth (the width of 2 fingers) or place your lips tightly around the mouthpiece. Ask your doctor which way to use your inhaler.  Breathe out as much air as possible.  Breathe in and push down on the bottle 1 time to release the medicine. You will feel the medicine go in your mouth and throat.  Continue to take a deep breath in very slowly. Try to fill your lungs.  After you have breathed in completely, hold your breath for 10 seconds. This will help the medicine to settle in your lungs. If you cannot hold your breath for 10 seconds, hold it for as long as you can before you breathe out.  Breathe out slowly, through pursed lips. Whistling is an example of pursed lips.  If your doctor has told you to take more than 1 puff, wait at least 15-30 seconds between  puffs. This will help you get the best results from your medicine. Do not use the inhaler more than your doctor tells you to.  Put the cap back on the inhaler.  Follow the directions from your doctor or from the inhaler package about cleaning the inhaler. If you use more than one inhaler, ask your doctor which inhalers to use and what order to use them in. Ask your doctor to help you figure out when you will need to refill your  inhaler.  If you use a steroid inhaler, always rinse your mouth with water after your last puff, gargle and spit out the water. Do not swallow the water. GET HELP IF:  The inhaler medicine only partially helps to stop wheezing or shortness of breath.  You are having trouble using your inhaler.  You have some increase in thick spit (phlegm). GET HELP RIGHT AWAY IF:  The inhaler medicine does not help your wheezing or shortness of breath or you have tightness in your chest.  You have dizziness, headaches, or fast heart rate.  You have chills, fever, or night sweats.  You have a large increase of thick spit, or your thick spit is bloody. MAKE SURE YOU:   Understand these instructions.  Will watch your condition.  Will get help right away if you are not doing well or get worse. Document Released: 04/15/2008 Document Revised: 04/27/2013 Document Reviewed: 02/03/2013 [facility] Patient Information 2015 [place], [place]. This information is not intended to replace advice given to you by your health care provider. Make sure you discuss any questions you have with your health care provider.  Viral Gastroenteritis Viral gastroenteritis is also called stomach flu. This illness is caused by a certain type of germ (virus). It can cause sudden watery poop (diarrhea) and throwing up (vomiting). This can cause you to lose body fluids (dehydration). This illness usually lasts for 3 to 8 days. It usually goes away on its own. HOME CARE   Drink enough fluids to keep your pee (urine) clear or pale yellow. Drink small amounts of fluids often.  Ask your doctor how to replace body fluid losses (rehydration).  Avoid:  Foods high in sugar.  Alcohol.  Bubbly (carbonated) drinks.  Tobacco.  Juice.  Caffeine drinks.  Very hot or cold fluids.  Fatty, greasy foods.  Eating too much at one time.  Dairy products until 24 to 48 hours after your watery poop stops.  You may eat foods with active  cultures (probiotics). They can be found in some yogurts and supplements.  Wash your hands well to avoid spreading the illness.  Only take medicines as told by your doctor. Do not give aspirin to children. Do not take medicines for watery poop (antidiarrheals).  Ask your doctor if you should keep taking your regular medicines.  Keep all doctor visits as told. GET HELP RIGHT AWAY IF:   You cannot keep fluids down.  You do not pee at least once every 6 to 8 hours.  You are short of breath.  You see blood in your poop or throw up. This may look like coffee grounds.  You have belly (abdominal) pain that gets worse or is just in one small spot (localized).  You keep throwing up or having watery poop.  You have a fever.  The patient is a child younger than 3 months, and he or she has a fever.  The patient is a child older than 3 months, and he or she has a fever  and problems that do not go away.  The patient is a child older than 3 months, and he or she has a fever and problems that suddenly get worse.  The patient is a baby, and he or she has no tears when crying. MAKE SURE YOU:   Understand these instructions.  Will watch your condition.  Will get help right away if you are not doing well or get worse. Document Released: 12/24/2007 Document Revised: 09/29/2011 Document Reviewed: 04/23/2011 [facility] Patient Information 2015 [place], [place]. This information is not intended to replace advice given to you by your health care provider. Make sure you discuss any questions you have with your health care provider.  Dehydration, Adult Dehydration means your body does not have as much fluid as it needs. Your kidneys, brain, and heart will not work properly without the right amount of fluids and salt.  HOME CARE  Ask your doctor how to replace body fluid losses (rehydrate).  Drink enough fluids to keep your pee (urine) clear or pale yellow.  Drink small amounts of fluids often if  you feel sick to your stomach (nauseous) or throw up (vomit).  Eat like you normally do.  Avoid:  Foods or drinks high in sugar.  Bubbly (carbonated) drinks.  Juice.  Very hot or cold fluids.  Drinks with caffeine.  Fatty, greasy foods.  Alcohol.  Tobacco.  Eating too much.  Gelatin desserts.  Wash your hands to avoid spreading germs (bacteria, viruses).  Only take medicine as told by your doctor.  Keep all doctor visits as told. GET HELP RIGHT AWAY IF:   You cannot drink something without throwing up.  You get worse even with treatment.  Your vomit has blood in it or looks greenish.  Your poop (stool) has blood in it or looks black and tarry.  You have not peed in 6 to 8 hours.  You pee a small amount of very dark pee.  You have a fever.  You pass out (faint).  You have belly (abdominal) pain that gets worse or stays in one spot (localizes).  You have a rash, stiff neck, or bad headache.  You get easily annoyed, sleepy, or are hard to wake up.  You feel weak, dizzy, or very thirsty. MAKE SURE YOU:   Understand these instructions.  Will watch your condition.  Will get help right away if you are not doing well or get worse. Document Released: 05/03/2009 Document Revised: 09/29/2011 Document Reviewed: 02/24/2011 [facility] Patient Information 2015 [place], [place]. This information is not intended to replace advice given to you by your health care provider. Make sure you discuss any questions you have with your health care provider.  Potassium Content of Foods Potassium is a mineral found in many foods and drinks. It helps keep fluids and minerals balanced in your body and affects how steadily your heart beats. Potassium also helps control your blood pressure and keep your muscles and nervous system healthy. Certain health conditions and medicines may change the balance of potassium in your body. When this happens, you can help balance your level of  potassium through the foods that you do or do not eat. Your health care provider or dietitian may recommend an amount of potassium that you should have each day. The following lists of foods provide the amount of potassium (in parentheses) per serving in each item. HIGH IN POTASSIUM  The following foods and beverages have 200 mg or more of potassium per serving:  Apricots, 2 raw  or 5 dry (200 mg).  Artichoke, 1 medium (345 mg).  Avocado, raw,  each (245 mg).  Banana, 1 medium (425 mg).  Beans, lima, or baked beans, canned,  cup (280 mg).  Beans, white, canned,  cup (595 mg).  Beef roast, 3 oz (320 mg).  Beef, ground, 3 oz (270 mg).  Beets, raw or cooked,  cup (260 mg).  Bran muffin, 2 oz (300 mg).  Broccoli,  cup (230 mg).  Brussels sprouts,  cup (250 mg).  Cantaloupe,  cup (215 mg).  Cereal, 100% bran,  cup (200-400 mg).  Cheeseburger, single, fast food, 1 each (225-400 mg).  Chicken, 3 oz (220 mg).  Clams, canned, 3 oz (535 mg).  Crab, 3 oz (225 mg).  Dates, 5 each (270 mg).  Dried beans and peas,  cup (300-475 mg).  Figs, dried, 2 each (260 mg).  Fish: halibut, tuna, cod, snapper, 3 oz (480 mg).  Fish: salmon, haddock, swordfish, perch, 3 oz (300 mg).  Fish, tuna, canned 3 oz (200 mg).  [place] fries, fast food, 3 oz (470 mg).  Granola with fruit and nuts,  cup (200 mg).  Grapefruit juice,  cup (200 mg).  Greens, beet,  cup (655 mg).  Honeydew melon,  cup (200 mg).  Kale, raw, 1 cup (300 mg).  Kiwi, 1 medium (240 mg).  Kohlrabi, rutabaga, parsnips,  cup (280 mg).  Lentils,  cup (365 mg).  Mango, 1 each (325 mg).  Milk, chocolate, 1 cup (420 mg).  Milk: nonfat, low-fat, whole, buttermilk, 1 cup (350-380 mg).  Molasses, 1 Tbsp (295 mg).  Mushrooms,  cup (280) mg.  Nectarine, 1 each (275 mg).  Nuts: almonds, peanuts, hazelnuts, [place], cashew, mixed, 1 oz (200 mg).  Nuts, pistachios, 1 oz (295 mg).  Orange, 1 each  (240 mg).  Orange juice,  cup (235 mg).  Papaya, medium,  fruit (390 mg).  Peanut butter, chunky, 2 Tbsp (240 mg).  Peanut butter, smooth, 2 Tbsp (210 mg).  Pear, 1 medium (200 mg).  Pomegranate, 1 whole (400 mg).  Pomegranate juice,  cup (215 mg).  Pork, 3 oz (350 mg).  Potato chips, salted, 1 oz (465 mg).  Potato, baked with skin, 1 medium (925 mg).  Potatoes, boiled,  cup (255 mg).  Potatoes, mashed,  cup (330 mg).  Prune juice,  cup (370 mg).  Prunes, 5 each (305 mg).  Pudding, chocolate,  cup (230 mg).  Pumpkin, canned,  cup (250 mg).  Raisins, seedless,  cup (270 mg).  Seeds, sunflower or pumpkin, 1 oz (240 mg).  Soy milk, 1 cup (300 mg).  Spinach,  cup (420 mg).  Spinach, canned,  cup (370 mg).  Sweet potato, baked with skin, 1 medium (450 mg).  Swiss chard,  cup (480 mg).  Tomato or vegetable juice,  cup (275 mg).  Tomato sauce or puree,  cup (400-550 mg).  Tomato, raw, 1 medium (290 mg).  Tomatoes, canned,  cup (200-300 mg).  [place], 3 oz (250 mg).  Wheat germ, 1 oz (250 mg).  Winter squash,  cup (250 mg).  Yogurt, plain or fruited, 6 oz (260-435 mg).  Zucchini,  cup (220 mg). MODERATE IN POTASSIUM The following foods and beverages have 50-200 mg of potassium per serving:  Apple, 1 each (150 mg).  Apple juice,  cup (150 mg).  Applesauce,  cup (90 mg).  Apricot nectar,  cup (140 mg).  Asparagus, small spears,  cup or 6 spears (155 mg).  Bagel, cinnamon  raisin, 1 each (130 mg).  Bagel, egg or plain, 4 in., 1 each (70 mg).  Beans, green,  cup (90 mg).  Beans, yellow,  cup (190 mg).  Beer, regular, 12 oz (100 mg).  Beets, canned,  cup (125 mg).  Blackberries,  cup (115 mg).  Blueberries,  cup (60 mg).  Bread, whole wheat, 1 slice (70 mg).  Broccoli, raw,  cup (145 mg).  Cabbage,  cup (150 mg).  Carrots, cooked or raw,  cup (180 mg).  Cauliflower, raw,  cup (150 mg).  Celery, raw,   cup (155 mg).  Cereal, bran flakes, cup (120-150 mg).  Cheese, cottage,  cup (110 mg).  Cherries, 10 each (150 mg).  Chocolate, 1 oz bar (165 mg).  Coffee, brewed 6 oz (90 mg).  Corn,  cup or 1 ear (195 mg).  Cucumbers,  cup (80 mg).  Egg, large, 1 each (60 mg).  Eggplant,  cup (60 mg).  Endive, raw, cup (80 mg).  English muffin, 1 each (65 mg).  Fish, orange roughy, 3 oz (150 mg).  Frankfurter, beef or pork, 1 each (75 mg).  Fruit cocktail,  cup (115 mg).  Grape juice,  cup (170 mg).  Grapefruit,  fruit (175 mg).  Grapes,  cup (155 mg).  Greens: kale, turnip, collard,  cup (110-150 mg).  Ice cream or frozen yogurt, chocolate,  cup (175 mg).  Ice cream or frozen yogurt, vanilla,  cup (120-150 mg).  Lemons, limes, 1 each (80 mg).  Lettuce, all types, 1 cup (100 mg).  Mixed vegetables,  cup (150 mg).  Mushrooms, raw,  cup (110 mg).  Nuts: walnuts, pecans, or macadamia, 1 oz (125 mg).  Oatmeal,  cup (80 mg).  Okra,  cup (110 mg).  Onions, raw,  cup (120 mg).  Peach, 1 each (185 mg).  Peaches, canned,  cup (120 mg).  Pears, canned,  cup (120 mg).  Peas, green, frozen,  cup (90 mg).  Peppers, green,  cup (130 mg).  Peppers, red,  cup (160 mg).  Pineapple juice,  cup (165 mg).  Pineapple, fresh or canned,  cup (100 mg).  Plums, 1 each (105 mg).  Pudding, vanilla,  cup (150 mg).  Raspberries,  cup (90 mg).  Rhubarb,  cup (115 mg).  Rice, wild,  cup (80 mg).  Shrimp, 3 oz (155 mg).  Spinach, raw, 1 cup (170 mg).  Strawberries,  cup (125 mg).  Summer squash  cup (175-200 mg).  Swiss chard, raw, 1 cup (135 mg).  Tangerines, 1 each (140 mg).  Tea, brewed, 6 oz (65 mg).  Turnips,  cup (140 mg).  Watermelon,  cup (85 mg).  Wine, red, table, 5 oz (180 mg).  Wine, white, table, 5 oz (100 mg). LOW IN POTASSIUM The following foods and beverages have less than 50 mg of potassium per  serving.  Bread, white, 1 slice (30 mg).  Carbonated beverages, 12 oz (less than 5 mg).  Cheese, 1 oz (20-30 mg).  Cranberries,  cup (45 mg).  Cranberry juice cocktail,  cup (20 mg).  Fats and oils, 1 Tbsp (less than 5 mg).  Hummus, 1 Tbsp (32 mg).  Nectar: papaya, mango, or pear,  cup (35 mg).  Rice, white or brown,  cup (50 mg).  Spaghetti or macaroni,  cup cooked (30 mg).  Tortilla, flour or corn, 1 each (50 mg).  Waffle, 4 in., 1 each (50 mg).  Water chestnuts,  cup (40 mg). Document Released: 02/18/2005 Document Revised:  07/12/2013 Document Reviewed: 06/03/2013 [employer] Patient Information 2015 [place], [place]. This information is not intended to replace advice given to you by your health care provider. Make sure you discuss any questions you have with your health care provider.  Hypokalemia Hypokalemia means that the amount of potassium in the blood is lower than normal.Potassium is a chemical, called an electrolyte, that helps regulate the amount of fluid in the body. It also stimulates muscle contraction and helps nerves function properly.Most of the body's potassium is inside of cells, and only a very small amount is in the blood. Because the amount in the blood is so small, minor changes can be life-threatening. CAUSES  Antibiotics.  Diarrhea or vomiting.  Using laxatives too much, which can cause diarrhea.  Chronic kidney disease.  Water pills (diuretics).  Eating disorders (bulimia).  Low magnesium level.  Sweating a lot. SIGNS AND SYMPTOMS  Weakness.  Constipation.  Fatigue.  Muscle cramps.  Mental confusion.  Skipped heartbeats or irregular heartbeat (palpitations).  Tingling or numbness. DIAGNOSIS  Your health care provider can diagnose hypokalemia with blood tests. In addition to checking your potassium level, your health care provider may also check other lab tests. TREATMENT Hypokalemia can be treated with potassium  supplements taken by mouth or adjustments in your current medicines. If your potassium level is very low, you may need to get potassium through a vein (IV) and be monitored in the hospital. A diet high in potassium is also helpful. Foods high in potassium are:  Nuts, such as peanuts and pistachios.  Seeds, such as sunflower seeds and pumpkin seeds.  Peas, lentils, and lima beans.  Whole grain and bran cereals and breads.  Fresh fruit and vegetables, such as apricots, avocado, bananas, cantaloupe, kiwi, oranges, tomatoes, asparagus, and potatoes.  Orange and tomato juices.  Red meats.  Fruit yogurt. HOME CARE INSTRUCTIONS  Take all medicines as prescribed by your health care provider.  Maintain a healthy diet by including nutritious food, such as fruits, vegetables, nuts, whole grains, and lean meats.  If you are taking a laxative, be sure to follow the directions on the label. SEEK MEDICAL CARE IF:  Your weakness gets worse.  You feel your heart pounding or racing.  You are vomiting or having diarrhea.  You are diabetic and having trouble keeping your blood glucose in the normal range. SEEK IMMEDIATE MEDICAL CARE IF:  You have chest pain, shortness of breath, or dizziness.  You are vomiting or having diarrhea for more than 2 days.  You faint. MAKE SURE YOU:   Understand these instructions.  Will watch your condition.  Will get help right away if you are not doing well or get worse. Document Released: 07/07/2005 Document Revised: 04/27/2013 Document Reviewed: 01/07/2013 [facility] Patient Information 2015 [place], [place]. This information is not intended to replace advice given to you by your health care provider. Make sure you discuss any questions you have with your health care provider.

## 2014-07-25 NOTE — ED Provider Notes (Signed)
CSN: [id number]     Arrival date & time 07/25/14  1712 History   First MD Initiated Contact with Patient 07/25/14 1728     Chief Complaint  Patient presents with  . URI    Patient is a 64 y.o. female presenting with flu symptoms.  Influenza Presenting symptoms: cough, diarrhea, fatigue, fever, nausea, rhinorrhea, sore throat and vomiting   Severity:  Moderate Onset quality:  Gradual Duration:  3 days Progression:  Unchanged Chronicity:  New Relieved by:  OTC medications Worsened by:  Drinking and eating Associated symptoms: nasal congestion   Associated symptoms: no decreased appetite, no decrease in physical activity, no ear pain and no neck stiffness   Pt reports onset of cough, sore throat, chest and nasal congestion on Sunday.  She became hoarse d/t all the coughing. These symptoms were associated with low grade fever of 100-100.1 onn Sunday and Monday. Those symptoms seemed to somewhat improve then last night pt had onset of N/V/D. She reports approx 7 episodes of vomiting and (too numerous to count) episodes of diarrhea over the last 24 hours. Some relief with OTC antidiarrheal medication but has not been able to keep down food or clear liquid today. No significant abd pain. Denies UTI like symptoms. No further low grade fever since Monday. Admits having her grandchildren a week ago and they weree all sick with similar GI type of symptoms. Pt also reports that she is prone to get severe bronchitis in the winter ( usually treated 3-4 times a winter for it). Non-smoker.   Past Medical History  Diagnosis Date  . Anxiety   . Depression     treated at [facility]  . Poisoning by selective serotonin reuptake inhibitors(969.03) Jan 2007    severe reaction with MAOI (nardil) and dextromethorphan  . Goiter     hx of goiter  . Allergic rhinitis   . Chronic sinusitis   . DEGENERATIVE JOINT DISEASE, HANDS     several images revealing OA  . Palpitations     Associated with  difficulty breathing  . Tachycardia   . Panic attacks   . Atrial fibrillation with RVR    Past Surgical History  Procedure Laterality Date  . Right mastoidectomy      ET tube placement in 1980 and 1990  . Thyroidectomy, partial      nodule removal - April 1996  . Right sinus surgery  october 2000  . Ovarian cyst removal  march 1969    left ovarian cystectomy  . Appendectomy     Family History  Problem Relation Age of Onset  . Hypertension Mother   . Hypertension Father   . Psoriasis Father   . Atrial fibrillation Sister     had a successful a fib ablation  . Atrial fibrillation Sister     had a successful a fib ablation   History  Substance Use Topics  . Smoking status: Never Smoker   . Smokeless tobacco: Never Used  . Alcohol Use: No   OB History    Gravida Para Term Preterm AB TAB SAB Ectopic Multiple Living   4 3 3  1  1   3      Review of Systems  Constitutional: Positive for fever, activity change, appetite change and fatigue. Negative for decreased appetite.  HENT: Positive for congestion, postnasal drip, rhinorrhea, sore throat and voice change. Negative for ear discharge, ear pain, facial swelling, sinus pressure and trouble swallowing.   Eyes: Negative.   Respiratory:  Positive for cough and wheezing.   Gastrointestinal: Positive for nausea, vomiting and diarrhea. Negative for abdominal pain.  Endocrine: Negative.   Genitourinary: Negative.   Musculoskeletal: Negative for neck stiffness.  Skin: Negative.   Allergic/Immunologic: Negative.   Neurological: Negative.     Allergies  Guaifenesin  Home Medications   Prior to Admission medications   Medication Sig Start Date End Date Taking? Authorizing Provider  gabapentin (NEURONTIN) 300 MG capsule Take 300 mg by mouth 3 (three) times daily.     Yes Historical Provider, MD  naproxen (NAPROSYN) 500 MG tablet Take 1 tablet (500 mg total) by mouth 2 (two) times daily. 07/08/14  Yes Harden Mo, MD   albuterol (PROVENTIL HFA;VENTOLIN HFA) 108 (90 BASE) MCG/ACT inhaler Inhale 2 puffs into the lungs every 6 (six) hours as needed for wheezing or shortness of breath. 01/20/13   Wilber Oliphant, MD  ALPRAZolam Duanne Moron) 0.5 MG tablet Take 0.5 mg by mouth 3 (three) times daily as needed for anxiety.     Historical Provider, MD  amoxicillin (AMOXIL) 500 MG capsule Take 1 capsule (500 mg total) by mouth 3 (three) times daily. 07/25/14   Rhetta Mura Choua Chalker, NP  aspirin EC 81 MG tablet Take 81 mg by mouth daily.    Historical Provider, MD  HYDROcodone-acetaminophen (NORCO/VICODIN) 5-325 MG per tablet 1 to 2 tabs every 4 to 6 hours as needed for pain. 07/08/14   Harden Mo, MD  promethazine-dextromethorphan (PROMETHAZINE-DM) 6.25-15 MG/5ML syrup Take 5 mLs by mouth 4 (four) times daily as needed for cough. 07/25/14   Rhetta Mura Kodi Guerrera, NP  propranolol (INDERAL) 10 MG tablet Take 1 tablet (10 mg total) by mouth 3 (three) times daily. 06/23/14   Pixie Casino, MD  QUEtiapine (SEROQUEL) 25 MG tablet Take 12.5 mg by mouth at bedtime.     Historical Provider, MD  traZODone (DESYREL) 100 MG tablet Take 100 mg by mouth at bedtime.    Historical Provider, MD  venlafaxine XR (EFFEXOR-XR) 75 MG 24 hr capsule Take 75 mg by mouth 2 (two) times daily. Patient takes one tablet in the morning and one at lunch time.    Historical Provider, MD   BP 114/64 mmHg  Pulse 92  Temp(Src) 98 F (36.7 C) (Oral)  Resp 12  SpO2 98% Physical Exam  Constitutional: She is oriented to person, place, and time. She appears well-developed and well-nourished.  HENT:  Head: Normocephalic and atraumatic.  Left Ear: Tympanic membrane, external ear and ear canal normal.  Nose: Nose normal. Right sinus exhibits no maxillary sinus tenderness and no frontal sinus tenderness. Left sinus exhibits no maxillary sinus tenderness and no frontal sinus tenderness.  Mouth/Throat: Uvula is midline. Mucous membranes are dry. Posterior oropharyngeal  erythema present.  Eyes: Conjunctivae are normal.  Neck: Normal range of motion.  Cardiovascular: Normal rate and regular rhythm.   Pulmonary/Chest: Effort normal and breath sounds normal.  Abdominal: Soft. Bowel sounds are normal. She exhibits no distension and no mass. There is no tenderness. There is no rebound and no guarding.  Lymphadenopathy:    She has cervical adenopathy.  Neurological: She is alert and oriented to person, place, and time.  Skin: Skin is warm and dry.  Psychiatric: She has a normal mood and affect.  Nursing note and vitals reviewed.   ED Course  Procedures (including critical care time) Labs Review Labs Reviewed  POCT URINALYSIS DIP (DEVICE) - Abnormal; Notable for the following:    Hgb urine dipstick  TRACE (*)    All other components within normal limits  URINALYSIS, DIPSTICK ONLY  POCT I-STAT, CHEM 8    Imaging Review No results found.   MDM   1. Bronchitis   2. Viral gastroenteritis   3. Dehydration, moderate   4. Hypokalemia    PE and labs c/w dehydration and hypokalemia s/p N/V/D x 24 hrs. Pt improved after 1 L IVF's, IV Zofran and PO potassium. Able to tolerate sips of cl liq prior to d/c. Given h/o chronic bronchitis will treat w/ Amoxicillin and Albuterol inhaler w/ PRN med for cough (promethazine-DM). Pt to rest, frequent sips of cl liq, meds as directed and diet/home care instructions until symptoms improve, (discussed and provided in writing). If symptoms persist or worsen pt to arrange f/u at [facility].     Jeryl Columbia, NP 07/25/14 1946

## 2014-10-19 ENCOUNTER — Emergency Department (HOSPITAL_COMMUNITY)
Admission: EM | Admit: 2014-10-19 | Discharge: 2014-10-20 | Disposition: A | Discharge status: Home / Self Care | Payer: Self-pay | Attending: Emergency Medicine | Admitting: Emergency Medicine

## 2014-10-19 ENCOUNTER — Encounter (HOSPITAL_COMMUNITY): Payer: Self-pay | Admitting: Emergency Medicine

## 2014-10-19 DIAGNOSIS — Z8709 Personal history of other diseases of the respiratory system: Secondary | ICD-10-CM | POA: Insufficient documentation

## 2014-10-19 DIAGNOSIS — Z792 Long term (current) use of antibiotics: Secondary | ICD-10-CM | POA: Insufficient documentation

## 2014-10-19 DIAGNOSIS — Z791 Long term (current) use of non-steroidal anti-inflammatories (NSAID): Secondary | ICD-10-CM | POA: Insufficient documentation

## 2014-10-19 DIAGNOSIS — Z7982 Long term (current) use of aspirin: Secondary | ICD-10-CM | POA: Insufficient documentation

## 2014-10-19 DIAGNOSIS — I479 Paroxysmal tachycardia, unspecified: Secondary | ICD-10-CM

## 2014-10-19 DIAGNOSIS — F410 Panic disorder [episodic paroxysmal anxiety] without agoraphobia: Secondary | ICD-10-CM | POA: Insufficient documentation

## 2014-10-19 DIAGNOSIS — F329 Major depressive disorder, single episode, unspecified: Secondary | ICD-10-CM | POA: Insufficient documentation

## 2014-10-19 DIAGNOSIS — I4891 Unspecified atrial fibrillation: Secondary | ICD-10-CM | POA: Insufficient documentation

## 2014-10-19 DIAGNOSIS — Z79899 Other long term (current) drug therapy: Secondary | ICD-10-CM | POA: Insufficient documentation

## 2014-10-19 NOTE — ED Notes (Signed)
Pt reports "heart racing" at home; EMS reports SVT upon arrival;  6mg  Adenosine given by EMS enroute with conversion to NSR (HR 90's); pt denies CP, palpitations, sob, n/v; pt reports hx of same with plan for ablation

## 2014-10-20 NOTE — ED Provider Notes (Signed)
CSN: [id number]     Arrival date & time 10/19/14  2344 History  This chart was scribed for Julianne Rice, MD by Rayfield Citizen, ED Scribe. This patient was seen in room D33C/D33C and the patient's care was started at 1:00 AM.    Chief Complaint  Patient presents with  . Irregular Heart Beat   The history is provided by the patient. No language interpreter was used.     HPI Comments: Sheri [name] is a 64 y.o. female with past medical history of anxiety, depression, palpitations, tachycardiac, atrial fibrillation with RVR who presents to the Emergency Department complaining of irregular heart beat beginning around 22:30 tonight. Patient explains that she was resting comfortably when she felt her "heart racing"; approximately an hour later, she called EMS and came to the ED. En route, EMS gave 6mg  Adenosine with conversion to NSR (HR 90s). She denies chest pain, SOB, nausea, vomiting. She denies recent fevers, cough, swelling or pain in her lower legs.   Cardiologist Dr. Debara Pickett; patient reports that she does not regularly take her propanolol, "I only take it when I need it." Patient states that this is her first occurrence of symptoms this year; she had several episodes last year. She is in the process of scheduling an ablation.   PCP at [facility].   Past Medical History  Diagnosis Date  . Anxiety   . Depression     treated at [facility]  . Poisoning by selective serotonin reuptake inhibitors(969.03) Jan 2007    severe reaction with MAOI (nardil) and dextromethorphan  . Goiter     hx of goiter  . Allergic rhinitis   . Chronic sinusitis   . DEGENERATIVE JOINT DISEASE, HANDS     several images revealing OA  . Palpitations     Associated with difficulty breathing  . Tachycardia   . Panic attacks   . Atrial fibrillation with RVR    Past Surgical History  Procedure Laterality Date  . Right mastoidectomy      ET tube placement in 1980 and 1990  . Thyroidectomy, partial      nodule removal - April 1996  . Right sinus surgery  october 2000  . Ovarian cyst removal  march 1969    left ovarian cystectomy  . Appendectomy     Family History  Problem Relation Age of Onset  . Hypertension Mother   . Hypertension Father   . Psoriasis Father   . Atrial fibrillation Sister     had a successful a fib ablation  . Atrial fibrillation Sister     had a successful a fib ablation   History  Substance Use Topics  . Smoking status: Never Smoker   . Smokeless tobacco: Never Used  . Alcohol Use: No   OB History    Gravida Para Term Preterm AB TAB SAB Ectopic Multiple Living   4 3 3  1  1   3      Review of Systems  Constitutional: Negative for fever and chills.  Respiratory: Negative for chest tightness and shortness of breath.   Cardiovascular: Positive for palpitations. Negative for chest pain and leg swelling.  Gastrointestinal: Negative for nausea, vomiting, abdominal pain and diarrhea.  Musculoskeletal: Negative for neck pain and neck stiffness.  Skin: Negative for rash.  Neurological: Negative for dizziness, weakness, light-headedness, numbness and headaches.  All other systems reviewed and are negative.     Allergies  Guaifenesin  Home Medications   Prior to Admission  medications   Medication Sig Start Date End Date Taking? Authorizing Provider  albuterol (PROVENTIL HFA;VENTOLIN HFA) 108 (90 BASE) MCG/ACT inhaler Inhale 2 puffs into the lungs every 6 (six) hours as needed for wheezing or shortness of breath. 01/20/13   Wilber Oliphant, MD  ALPRAZolam Duanne Moron) 0.5 MG tablet Take 0.5 mg by mouth 3 (three) times daily as needed for anxiety.     Historical Provider, MD  amoxicillin (AMOXIL) 500 MG capsule Take 1 capsule (500 mg total) by mouth 3 (three) times daily. 07/25/14   Rhetta Mura Schorr, NP  aspirin EC 81 MG tablet Take 81 mg by mouth daily.    Historical Provider, MD  gabapentin (NEURONTIN) 300 MG capsule Take 300 mg by mouth 3 (three) times daily.       Historical Provider, MD  HYDROcodone-acetaminophen (NORCO/VICODIN) 5-325 MG per tablet 1 to 2 tabs every 4 to 6 hours as needed for pain. 07/08/14   Harden Mo, MD  naproxen (NAPROSYN) 500 MG tablet Take 1 tablet (500 mg total) by mouth 2 (two) times daily. 07/08/14   Harden Mo, MD  promethazine-dextromethorphan (PROMETHAZINE-DM) 6.25-15 MG/5ML syrup Take 5 mLs by mouth 4 (four) times daily as needed for cough. 07/25/14   Rhetta Mura Schorr, NP  propranolol (INDERAL) 10 MG tablet Take 1 tablet (10 mg total) by mouth 3 (three) times daily. 06/23/14   Pixie Casino, MD  QUEtiapine (SEROQUEL) 25 MG tablet Take 12.5 mg by mouth at bedtime.     Historical Provider, MD  traZODone (DESYREL) 100 MG tablet Take 100 mg by mouth at bedtime.    Historical Provider, MD  venlafaxine XR (EFFEXOR-XR) 75 MG 24 hr capsule Take 75 mg by mouth 2 (two) times daily. Patient takes one tablet in the morning and one at lunch time.    Historical Provider, MD   BP 86/70 mmHg  Pulse 90  Temp(Src) 98.2 F (36.8 C) (Oral)  Resp 19  Ht 5\' 5"  (1.651 m)  Wt 153 lb (69.4 kg)  BMI 25.46 kg/m2  SpO2 95% Physical Exam  Constitutional: She is oriented to person, place, and time. She appears well-developed and well-nourished. No distress.  HENT:  Head: Normocephalic and atraumatic.  Mouth/Throat: Oropharynx is clear and moist.  Eyes: EOM are normal. Pupils are equal, round, and reactive to light.  Neck: Normal range of motion. Neck supple.  Cardiovascular: Normal rate and regular rhythm.  Exam reveals no gallop and no friction rub.   No murmur heard. Pulmonary/Chest: Effort normal and breath sounds normal. No respiratory distress. She has no wheezes. She has no rales. She exhibits no tenderness.  Abdominal: Soft. Bowel sounds are normal.  Musculoskeletal: Normal range of motion. She exhibits no edema or tenderness.  Neurological: She is alert and oriented to person, place, and time.  Skin: Skin is warm and dry.  No rash noted. No erythema.  Psychiatric: She has a normal mood and affect. Her behavior is normal.  Nursing note and vitals reviewed.   ED Course  Procedures   DIAGNOSTIC STUDIES: Oxygen Saturation is 99% on RA, normal by my interpretation.    COORDINATION OF CARE: 1:06 AM Discussed treatment plan with pt at bedside and pt agreed to plan.   Labs Review Labs Reviewed - No data to display  Imaging Review No results found.   EKG Interpretation   Date/Time:  Thursday October 19 2014 23:49:21 EDT Ventricular Rate:  95 PR Interval:  157 QRS Duration: 80 QT Interval:  362 QTC Calculation: 455 R Axis:   50 Text Interpretation:  Sinus rhythm Abnormal R-wave progression, early  transition ED PHYSICIAN INTERPRETATION AVAILABLE IN [facility]  Confirmed by TEST, Record ([id number]) on 10/21/2014 8:11:21 AM      MDM   Final diagnoses:  Tachycardia, paroxysmal   I personally performed the services described in this documentation, which was scribed in my presence. The recorded information has been reviewed and is accurate.  Patient with episodic tachycardia converted to normal sinus rhythm by EMS. Patient is currently symptom-free. She's advised to follow-up with her cardiologist. Return precautions given.     Julianne Rice, MD 10/28/14 [phone]

## 2014-10-20 NOTE — Discharge Instructions (Signed)
Take your medications as previously prescribed. Call and make appointment to follow-up with your cardiologist. Return immediately for any worsening of your symptoms or concerns.  Supraventricular Tachycardia Supraventricular tachycardia (SVT) is an abnormal heart rhythm (arrhythmia) that causes the heart to beat very fast (tachycardia). This kind of fast heartbeat originates in the upper chambers of the heart (atria). SVT can cause the heart to beat greater than 100 beats per minute. SVT can have a rapid burst of heartbeats. This can start and stop suddenly without warning and is called nonsustained. SVT can also be sustained, in which the heart beats at a continuous fast rate.  CAUSES  There can be different causes of SVT. Some of these include:  Heart valve problems such as mitral valve prolapse.  An enlarged heart (hypertrophic cardiomyopathy).  Congenital heart problems.  Heart inflammation (pericarditis).  Hyperthyroidism.  Low potassium or magnesium levels.  Caffeine.  Drug use such as cocaine, methamphetamines, or stimulants.  Some over-the-counter medicines such as:  Decongestants.  Diet medicines.  Herbal medicines. SYMPTOMS  Symptoms of SVT can vary. Symptoms depend on whether the SVT is sustained or nonsustained. You may experience:  No symptoms (asymptomatic).  An awareness of your heart beating rapidly (palpitations).  Shortness of breath.  Chest pain or pressure. If your blood pressure drops because of the SVT, you may experience:  Fainting or near fainting.  Weakness.  Dizziness. DIAGNOSIS  Different tests can be performed to diagnose SVT, such as:  An electrocardiogram (EKG). This is a painless test that records the electrical activity of your heart.  Holter monitor. This is a 24 hour recording of your heart rhythm. You will be given a diary. Write down all symptoms that you have and what you were doing at the time you experienced  symptoms.  Arrhythmia monitor. This is a small device that your wear for several weeks. It records the heart rhythm when you have symptoms.  Echocardiogram. This is an imaging test to help detect abnormal heart structure such as congenital abnormalities, heart valve problems, or heart enlargement.  Stress test. This test can help determine if the SVT is related to exercise.  Electrophysiology study (EPS). This is a procedure that evaluates your heart's electrical system and can help your caregiver find the cause of your SVT. TREATMENT  Treatment of SVT depends on the symptoms, how often it recurs, and whether there are any underlying heart problems.   If symptoms are rare and no other cardiac disease is present, no treatment may be needed.  Blood work may be done to check potassium, magnesium, and thyroid hormone levels to see if they are abnormal. If these levels are abnormal, treatment to correct the problems will occur. Medicines Your caregiver may use oral medicines to treat SVT. These medicines are given for long-term control of SVT. Medicines may be used alone or in combination with other treatments. These medicines work to slow nerve impulses in the heart muscle. These medicines can also be used to treat high blood pressure. Some of these medicines may include:  Calcium channel blockers.  Beta blockers.  Digoxin. Nonsurgical procedures Nonsurgical techniques may be used if oral medicines do not work. Some examples include:  Cardioversion. This technique uses either drugs or an electrical shock to restore a normal heart rhythm.  Cardioversion drugs may be given through an intravenous (IV) line to help "reset" the heart rhythm.  In electrical cardioversion, the caregiver shocks your heart to stop its beat for a split second. This  helps to reset the heart to a normal rhythm.  Ablation. This procedure is done under mild sedation. High frequency radio wave energy is used to destroy  the area of heart tissue responsible for the SVT. HOME CARE INSTRUCTIONS   Do not smoke.  Only take medicines prescribed by your caregiver. Check with your caregiver before using over-the-counter medicines.  Check with your caregiver about how much alcohol and caffeine (coffee, tea, colas, or chocolate) you may have.  It is very important to keep all follow-up referrals and appointments in order to properly manage this problem. SEEK IMMEDIATE MEDICAL CARE IF:  You have dizziness.  You faint or nearly faint.  You have shortness of breath.  You have chest pain or pressure.  You have sudden nausea or vomiting.  You have profuse sweating.  You are concerned about how long your symptoms last.  You are concerned about the frequency of your SVT episodes. If you have the above symptoms, call your local emergency services (911 in [place] immediately. Do not drive yourself to the hospital. MAKE SURE YOU:   Understand these instructions.  Will watch your condition.  Will get help right away if you are not doing well or get worse. Document Released: 07/07/2005 Document Revised: 09/29/2011 Document Reviewed: 10/19/2008 [facility] Patient Information 2015 [place], [place]. This information is not intended to replace advice given to you by your health care provider. Make sure you discuss any questions you have with your health care provider.

## 2014-11-01 ENCOUNTER — Telehealth: Payer: Self-pay | Admitting: Internal Medicine

## 2014-11-01 NOTE — Telephone Encounter (Signed)
Call to patient to confirm appointment for 11/02/14 at 2:30 and 3:15 lmtcb

## 2014-11-02 ENCOUNTER — Encounter: Payer: Self-pay | Admitting: Internal Medicine

## 2014-11-02 ENCOUNTER — Ambulatory Visit: Payer: Self-pay

## 2014-11-15 ENCOUNTER — Ambulatory Visit: Payer: Self-pay

## 2014-11-15 ENCOUNTER — Ambulatory Visit (INDEPENDENT_AMBULATORY_CARE_PROVIDER_SITE_OTHER): Payer: Self-pay | Admitting: Internal Medicine

## 2014-11-15 ENCOUNTER — Encounter: Payer: Self-pay | Admitting: Internal Medicine

## 2014-11-15 VITALS — BP 113/69 | HR 94 | Temp 98.2°F | Ht 65.5 in | Wt 150.9 lb

## 2014-11-15 DIAGNOSIS — N760 Acute vaginitis: Secondary | ICD-10-CM | POA: Insufficient documentation

## 2014-11-15 DIAGNOSIS — F329 Major depressive disorder, single episode, unspecified: Secondary | ICD-10-CM

## 2014-11-15 DIAGNOSIS — I4891 Unspecified atrial fibrillation: Secondary | ICD-10-CM

## 2014-11-15 DIAGNOSIS — F411 Generalized anxiety disorder: Secondary | ICD-10-CM

## 2014-11-15 DIAGNOSIS — E039 Hypothyroidism, unspecified: Secondary | ICD-10-CM

## 2014-11-15 DIAGNOSIS — F32A Depression, unspecified: Secondary | ICD-10-CM

## 2014-11-15 DIAGNOSIS — K219 Gastro-esophageal reflux disease without esophagitis: Secondary | ICD-10-CM

## 2014-11-15 DIAGNOSIS — Z7982 Long term (current) use of aspirin: Secondary | ICD-10-CM

## 2014-11-15 DIAGNOSIS — E038 Other specified hypothyroidism: Secondary | ICD-10-CM | POA: Insufficient documentation

## 2014-11-15 LAB — CBC WITH DIFFERENTIAL/PLATELET
Basophils Absolute: 0 10*3/uL (ref 0.0–0.1)
Basophils Relative: 0 % (ref 0–1)
EOS ABS: 0.2 10*3/uL (ref 0.0–0.7)
Eosinophils Relative: 2 % (ref 0–5)
HCT: 44.7 % (ref 36.0–46.0)
Hemoglobin: 14.9 g/dL (ref 12.0–15.0)
LYMPHS ABS: 3.2 10*3/uL (ref 0.7–4.0)
Lymphocytes Relative: 35 % (ref 12–46)
MCH: 29.7 pg (ref 26.0–34.0)
MCHC: 33.3 g/dL (ref 30.0–36.0)
MCV: 89 fL (ref 78.0–100.0)
MONOS PCT: 9 % (ref 3–12)
MPV: 9.7 fL (ref 8.6–12.4)
Monocytes Absolute: 0.8 10*3/uL (ref 0.1–1.0)
NEUTROS PCT: 54 % (ref 43–77)
Neutro Abs: 4.9 10*3/uL (ref 1.7–7.7)
Platelets: 369 10*3/uL (ref 150–400)
RBC: 5.02 MIL/uL (ref 3.87–5.11)
RDW: 13.3 % (ref 11.5–15.5)
WBC: 9 10*3/uL (ref 4.0–10.5)

## 2014-11-15 LAB — COMPLETE METABOLIC PANEL WITH GFR
ALT: 17 U/L (ref 0–35)
AST: 18 U/L (ref 0–37)
Albumin: 4.3 g/dL (ref 3.5–5.2)
Alkaline Phosphatase: 78 U/L (ref 39–117)
BILIRUBIN TOTAL: 0.3 mg/dL (ref 0.2–1.2)
BUN: 13 mg/dL (ref 6–23)
CO2: 29 mEq/L (ref 19–32)
Calcium: 9.6 mg/dL (ref 8.4–10.5)
Chloride: 102 mEq/L (ref 96–112)
Creat: 0.74 mg/dL (ref 0.50–1.10)
GFR, Est Non African American: 86 mL/min
Glucose, Bld: 85 mg/dL (ref 70–99)
Potassium: 4.8 mEq/L (ref 3.5–5.3)
Sodium: 140 mEq/L (ref 135–145)
Total Protein: 7.3 g/dL (ref 6.0–8.3)

## 2014-11-15 LAB — TSH: TSH: 1.815 u[IU]/mL (ref 0.350–4.500)

## 2014-11-15 MED ORDER — OMEPRAZOLE 20 MG PO TBEC: 20.0000 mg | DELAYED_RELEASE_TABLET | Freq: Every day | ORAL | Status: DC

## 2014-11-15 NOTE — Progress Notes (Signed)
Patient ID: Sheri [name], female   DOB: 07-Oct-1950, 64 y.o.   MRN: [id number]   Subjective:   HPI: [name] is a 64 y.o. woman with past medical history as listed below 64 y.o. presents to the clinic with complaints of vaginal discharge.  Reason(s) for visit:  Vaginal discharge: Patient reports that she's been having vaginal discharge for the past 2 weeks. She describes her discharge as light yellow to white in color and she has had one episode of bloody discharge. However, she does not report cheeselike discharge. She endorses some irritation in her vaginal area. She denies urinary symptoms. She complains onf mild abdominal pain when she applies pressure to her epigastrium area. She has not had any sexual contact for the last 11 years. She has never experienced similar symptoms in the past. She requests for Pap smear as well since this has not been done.  Weight gain: Patient is concerned about her weight gain. Review of her electronic medical record indicates that she has gained 7 pounds in the last 1 month. She reports no changes in her diet. She states that she has previously had thyroid problems and she would like you to be rechecked. She has no heat or cold intolerance, and her bowel movements are normal. Wt Readings from Last 5 Encounters:  11/15/14 150 lb 14.4 oz (68.448 kg)  10/19/14 153 lb (69.4 kg)  06/12/14 148 lb 4.8 oz (67.268 kg)  04/14/13 141 lb 9.6 oz (64.229 kg)  03/31/13 139 lb (63.05 kg)   She is currently working on her orange card paperwork and she has seen [employer] today.  Please see my assessment and plan for the status of her other chronic medical problems  Past Medical History  Diagnosis Date  . Anxiety   . Depression     treated at [facility]  . Poisoning by selective serotonin reuptake inhibitors(969.03) Jan 2007    severe reaction with MAOI (nardil) and dextromethorphan  . Goiter     hx of goiter  . Allergic rhinitis   . Chronic sinusitis   .  DEGENERATIVE JOINT DISEASE, HANDS     several images revealing OA  . Palpitations     Associated with difficulty breathing  . Tachycardia   . Panic attacks   . Atrial fibrillation with RVR   . Hx of atrial tachycardia since age 21 03/15/2013    ROS: Constitutional:  Denies fevers, chills, diaphoresis, appetite change and fatigue.  Respiratory: Denies SOB, DOE, cough, chest tightness, and wheezing.  CVS: She reports intermittent palpitations without chest pain or shortness of breath. No chest pain, palpitations and leg swelling.  MSK: No myalgias, back pain, joint swelling, arthralgias  Psych: No depression symptoms. No SI or SA.    Objective:  Physical Exam: Filed Vitals:   11/15/14 1445  BP: 113/69  Pulse: 94  Temp: 98.2 F (36.8 C)  TempSrc: Oral  Height: 5' 5.5" (1.664 m)  Weight: 150 lb 14.4 oz (68.448 kg)  SpO2: 97%   General: Well nourished. No acute distress. HEENT: Normal oral mucosa. MMM.  Lungs: CTA bilaterally. No wheezing. Heart: RRR; no extra sounds or murmurs  Abdomen: Non-distended, normal bowel sounds, soft, nontender; no hepatosplenomegaly Pelvic exam: Used a nurse chaperone. Vagina and vulvar appear normal. Scanty vaginal discharge. No foul smell. She does have some tenderness when I introduce the speculum. Performed vaginal swab and the Pap smear. Extremities: No pedal edema. No joint swelling or tenderness. Neurologic: Normal EOM,  Alert  and oriented x3. No obvious neurologic/cranial nerve deficits.  Assessment & Plan:  Discussed case with Dr Eppie Gibson See problem based charting for assessment and plan.

## 2014-11-15 NOTE — Patient Instructions (Signed)
General Instructions: Please take omeoprazole 20 mg daily before breakfast for your abdominal pain  I will order some lab tests including the thyroid check  I will call you if any of your labs are concerning  Please come back in one month  Please bring your medicines with you each time you come to clinic.  Medicines may include prescription medications, over-the-counter medications, herbal remedies, eye drops, vitamins, or other pills.   Progress Toward Treatment Goals:  No flowsheet data found.  Self Care Goals & Plans:  Self Care Goal 09/08/2012  Manage my medications take my medicines as prescribed; refill my medications on time; bring my medications to every visit    No flowsheet data found.   Care Management & Community Referrals:  Referral 09/08/2012  Referrals made for care management support financial counselor     Vaginitis Vaginitis is an inflammation of the vagina. It is most often caused by a change in the normal balance of the bacteria and yeast that live in the vagina. This change in balance causes an overgrowth of certain bacteria or yeast, which causes the inflammation. There are different types of vaginitis, but the most common types are:  Bacterial vaginosis.  Yeast infection (candidiasis).  Trichomoniasis vaginitis. This is a sexually transmitted infection (STI).  Viral vaginitis.  Atropic vaginitis.  Allergic vaginitis. CAUSES  The cause depends on the type of vaginitis. Vaginitis can be caused by:  Bacteria (bacterial vaginosis).  Yeast (yeast infection).  A parasite (trichomoniasis vaginitis)  A virus (viral vaginitis).  Low hormone levels (atrophic vaginitis). Low hormone levels can occur during pregnancy, breastfeeding, or after menopause.  Irritants, such as bubble baths, scented tampons, and feminine sprays (allergic vaginitis). Other factors can change the normal balance of the yeast and bacteria that live in the vagina. These  include:  Antibiotic medicines.  Poor hygiene.  Diaphragms, vaginal sponges, spermicides, birth control pills, and intrauterine devices (IUD).  Sexual intercourse.  Infection.  Uncontrolled diabetes.  A weakened immune system. SYMPTOMS  Symptoms can vary depending on the cause of the vaginitis. Common symptoms include:  Abnormal vaginal discharge.  The discharge is white, gray, or yellow with bacterial vaginosis.  The discharge is thick, white, and cheesy with a yeast infection.  The discharge is frothy and yellow or greenish with trichomoniasis.  A bad vaginal odor.  The odor is fishy with bacterial vaginosis.  Vaginal itching, pain, or swelling.  Painful intercourse.  Pain or burning when urinating. Sometimes, there are no symptoms. TREATMENT  Treatment will vary depending on the type of infection.   Bacterial vaginosis and trichomoniasis are often treated with antibiotic creams or pills.  Yeast infections are often treated with antifungal medicines, such as vaginal creams or suppositories.  Viral vaginitis has no cure, but symptoms can be treated with medicines that relieve discomfort. Your sexual partner should be treated as well.  Atrophic vaginitis may be treated with an estrogen cream, pill, suppository, or vaginal ring. If vaginal dryness occurs, lubricants and moisturizing creams may help. You may be told to avoid scented soaps, sprays, or douches.  Allergic vaginitis treatment involves quitting the use of the product that is causing the problem. Vaginal creams can be used to treat the symptoms. HOME CARE INSTRUCTIONS   Take all medicines as directed by your caregiver.  Keep your genital area clean and dry. Avoid soap and only rinse the area with water.  Avoid douching. It can remove the healthy bacteria in the vagina.  Do not use  tampons or have sexual intercourse until your vaginitis has been treated. Use sanitary pads while you have  vaginitis.  Wipe from front to back. This avoids the spread of bacteria from the rectum to the vagina.  Let air reach your genital area.  Wear cotton underwear to decrease moisture buildup.  Avoid wearing underwear while you sleep until your vaginitis is gone.  Avoid tight pants and underwear or nylons without a cotton panel.  Take off wet clothing (especially bathing suits) as soon as possible.  Use mild, non-scented products. Avoid using irritants, such as:  Scented feminine sprays.  Fabric softeners.  Scented detergents.  Scented tampons.  Scented soaps or bubble baths.  Practice safe sex and use condoms. Condoms may prevent the spread of trichomoniasis and viral vaginitis. SEEK MEDICAL CARE IF:   You have abdominal pain.  You have a fever or persistent symptoms for more than 2-3 days.  You have a fever and your symptoms suddenly get worse. Document Released: 05/04/2007 Document Revised: 03/31/2012 Document Reviewed: 12/18/2011 [facility] Patient Information 2015 [place], [place]. This information is not intended to replace advice given to you by your health care provider. Make sure you discuss any questions you have with your health care provider.

## 2014-11-16 LAB — URINALYSIS, COMPLETE
BACTERIA UA: NONE SEEN
Bilirubin Urine: NEGATIVE
CRYSTALS: NONE SEEN
Casts: NONE SEEN
Glucose, UA: NEGATIVE mg/dL
Ketones, ur: NEGATIVE mg/dL
LEUKOCYTES UA: NEGATIVE
Nitrite: NEGATIVE
Protein, ur: NEGATIVE mg/dL
SQUAMOUS EPITHELIAL / LPF: NONE SEEN
Specific Gravity, Urine: 1.013 (ref 1.005–1.030)
UROBILINOGEN UA: 0.2 mg/dL (ref 0.0–1.0)
pH: 5.5 (ref 5.0–8.0)

## 2014-11-16 LAB — CERVICOVAGINAL ANCILLARY ONLY: WET PREP (BD AFFIRM): NEGATIVE

## 2014-11-16 LAB — CYTOLOGY - PAP

## 2014-11-16 LAB — HIV ANTIBODY (ROUTINE TESTING W REFLEX): HIV 1&2 Ab, 4th Generation: NONREACTIVE

## 2014-11-16 NOTE — Assessment & Plan Note (Signed)
She goes to [facility] mental clinic where she receives her treatment for depression with Trazodone and Effexor. She also takes Seroquel for sleep. Her symptoms are well controlled.

## 2014-11-16 NOTE — Assessment & Plan Note (Signed)
Heart rate well controlled. CHA2DS2-VASc Score for Atrial Fibrillation Stroke Risk is 1 > low risk. She takes Propranolol and ASA. Will continue. She will need a referral to cardiology after she gets the orange card. She still has to complete more paperwork.

## 2014-11-16 NOTE — Assessment & Plan Note (Signed)
Symptoms are well controlled with her current treatment. She attends [facility].

## 2014-11-16 NOTE — Assessment & Plan Note (Signed)
Differential diagnosis for her vaginal irritation and vaginal discharge include bacterial vaginosis or even a urinary tract infection. She denies recent sexual contact. Pelvic exam does not reveal impressive inflammation. I don't see evidence of atrophic vaginitis. Will obtain a urinalysis to exclude UTI  Plan -Urinalysis largely normal except for microscopic hematuria. This will just need to be repeated -Vaginal wet prep pending. Will call in treatment depending on the results -Provided patient with information on home management of vaginal irritation.

## 2014-11-16 NOTE — Assessment & Plan Note (Signed)
I refilled Omeprazole 20 mg daily

## 2014-11-16 NOTE — Assessment & Plan Note (Signed)
No symptoms of hypothyroidism. She is concerned about her weight gain and thyroid function. Repeat TSH is normal today at 1.8. Her TSH was elevated a year ago at 6.0. No further work up needed.

## 2014-11-19 NOTE — Progress Notes (Signed)
Case discussed with Dr. Kazibwe soon after the resident saw the patient.  We reviewed the resident's history and exam and pertinent patient test results.  I agree with the assessment, diagnosis, and plan of care documented in the resident's note. 

## 2015-05-30 ENCOUNTER — Encounter: Payer: Self-pay | Admitting: Internal Medicine

## 2015-06-21 ENCOUNTER — Emergency Department (INDEPENDENT_AMBULATORY_CARE_PROVIDER_SITE_OTHER)
Admission: EM | Admit: 2015-06-21 | Discharge: 2015-06-21 | Disposition: A | Discharge status: Home / Self Care | Payer: Self-pay | Source: Home / Self Care | Attending: Family Medicine | Admitting: Family Medicine

## 2015-06-21 ENCOUNTER — Encounter (HOSPITAL_COMMUNITY): Payer: Self-pay | Admitting: *Deleted

## 2015-06-21 DIAGNOSIS — S39012A Strain of muscle, fascia and tendon of lower back, initial encounter: Secondary | ICD-10-CM

## 2015-06-21 LAB — POCT URINALYSIS DIP (DEVICE)
Bilirubin Urine: NEGATIVE
Glucose, UA: NEGATIVE mg/dL
HGB URINE DIPSTICK: NEGATIVE
Ketones, ur: NEGATIVE mg/dL
Leukocytes, UA: NEGATIVE
NITRITE: POSITIVE — AB
Protein, ur: NEGATIVE mg/dL
Specific Gravity, Urine: >=1.03 (ref 1.005–1.030)
UROBILINOGEN UA: 0.2 mg/dL (ref 0.0–1.0)
pH: 5.5 (ref 5.0–8.0)

## 2015-06-21 MED ORDER — CYCLOBENZAPRINE HCL 5 MG PO TABS: 5.0000 mg | ORAL_TABLET | Freq: Three times a day (TID) | ORAL | Status: DC | PRN

## 2015-06-21 MED ORDER — DICLOFENAC POTASSIUM 50 MG PO TABS: 50.0000 mg | ORAL_TABLET | Freq: Three times a day (TID) | ORAL | Status: DC

## 2015-06-21 NOTE — ED Provider Notes (Signed)
CSN: [id number]     Arrival date & time 06/21/15  1913 History   First MD Initiated Contact with Patient 06/21/15 1940     Chief Complaint  Patient presents with  . Back Pain   (Consider location/radiation/quality/duration/timing/severity/associated sxs/prior Treatment) Patient is a 64 y.o. female presenting with back pain. The history is provided by the patient.  Back Pain Location:  Lumbar spine Quality:  Shooting and stiffness Radiates to:  Does not radiate Pain severity:  Mild Onset quality:  Sudden (lifting a window a/c unit and felt twinge in low back, continues sore, no gi or gu sx., no lower ext sx.) Progression:  Partially resolved Context: lifting heavy objects   Context: not recent illness and not recent injury   Relieved by:  None tried Associated symptoms: no abdominal pain, no dysuria, no fever, no leg pain, no numbness, no paresthesias and no pelvic pain   Risk factors: lack of exercise     Past Medical History  Diagnosis Date  . Anxiety   . Depression     treated at [facility]  . Poisoning by selective serotonin reuptake inhibitors(969.03) Jan 2007    severe reaction with MAOI (nardil) and dextromethorphan  . Goiter     hx of goiter  . Allergic rhinitis   . Chronic sinusitis   . DEGENERATIVE JOINT DISEASE, HANDS     several images revealing OA  . Palpitations     Associated with difficulty breathing  . Tachycardia   . Panic attacks   . Atrial fibrillation with RVR ([place])   . Hx of atrial tachycardia since age 12 03/15/2013   Past Surgical History  Procedure Laterality Date  . Right mastoidectomy      ET tube placement in 1980 and 1990  . Thyroidectomy, partial      nodule removal - April 1996  . Right sinus surgery  october 2000  . Ovarian cyst removal  march 1969    left ovarian cystectomy  . Appendectomy     Family History  Problem Relation Age of Onset  . Hypertension Mother   . Hypertension Father   . Psoriasis Father   . Atrial  fibrillation Sister     had a successful a fib ablation  . Atrial fibrillation Sister     had a successful a fib ablation   Social History  Substance Use Topics  . Smoking status: Never Smoker   . Smokeless tobacco: Never Used  . Alcohol Use: No   OB History    Gravida Para Term Preterm AB TAB SAB Ectopic Multiple Living   4 3 3  1  1   3      Review of Systems  Constitutional: Negative for fever.  Gastrointestinal: Negative.  Negative for abdominal pain.  Genitourinary: Negative.  Negative for dysuria and pelvic pain.  Musculoskeletal: Positive for back pain. Negative for gait problem.  Skin: Negative.   Neurological: Negative for numbness and paresthesias.  All other systems reviewed and are negative.   Allergies  Guaifenesin  Home Medications   Prior to Admission medications   Medication Sig Start Date End Date Taking? Authorizing Provider  albuterol (PROVENTIL HFA;VENTOLIN HFA) 108 (90 BASE) MCG/ACT inhaler Inhale 2 puffs into the lungs every 6 (six) hours as needed for wheezing or shortness of breath. 01/20/13   Wilber Oliphant, MD  ALPRAZolam Duanne Moron) 0.5 MG tablet Take 0.5 mg by mouth 3 (three) times daily as needed for anxiety.     Historical Provider,  MD  aspirin EC 81 MG tablet Take 81 mg by mouth daily.    Historical Provider, MD  cyclobenzaprine (FLEXERIL) 5 MG tablet Take 1 tablet (5 mg total) by mouth 3 (three) times daily as needed for muscle spasms. 06/21/15   Billy Fischer, MD  diclofenac (CATAFLAM) 50 MG tablet Take 1 tablet (50 mg total) by mouth 3 (three) times daily. For back pain 06/21/15   Billy Fischer, MD  gabapentin (NEURONTIN) 300 MG capsule Take 300 mg by mouth 3 (three) times daily.      Historical Provider, MD  Omeprazole 20 MG TBEC Take 1 tablet (20 mg total) by mouth daily before breakfast. 11/15/14   Jessee Avers, MD  propranolol (INDERAL) 10 MG tablet Take 1 tablet (10 mg total) by mouth 3 (three) times daily. 06/23/14   Pixie Casino, MD   QUEtiapine (SEROQUEL) 25 MG tablet Take 12.5 mg by mouth at bedtime.     Historical Provider, MD  traZODone (DESYREL) 100 MG tablet Take 100 mg by mouth at bedtime.    Historical Provider, MD  venlafaxine XR (EFFEXOR-XR) 75 MG 24 hr capsule Take 75 mg by mouth 2 (two) times daily. Patient takes one tablet in the morning and one at lunch time.    Historical Provider, MD   Meds Ordered and Administered this Visit  Medications - No data to display  BP 120/74 mmHg  Pulse 81  Temp(Src) 98 F (36.7 C) (Oral)  Resp 16  SpO2 96% No data found.   Physical Exam  Constitutional: She is oriented to person, place, and time. She appears well-developed and well-nourished. No distress.  Abdominal: Soft. Bowel sounds are normal. There is no tenderness.  Musculoskeletal: She exhibits tenderness.  Nl heel and toe walking, neg slr, distal nvt intact.  Neurological: She is alert and oriented to person, place, and time.  Skin: Skin is warm and dry.  Nursing note and vitals reviewed.   ED Course  Procedures (including critical care time)  Labs Review Labs Reviewed  POCT URINALYSIS DIP (DEVICE) - Abnormal; Notable for the following:    Nitrite POSITIVE (*)    All other components within normal limits   U/a wnl  Imaging Review No results found.   Visual Acuity Review  Right Eye Distance:   Left Eye Distance:   Bilateral Distance:    Right Eye Near:   Left Eye Near:    Bilateral Near:         MDM   1. Low back strain, initial encounter        Billy Fischer, MD 06/21/15 2012

## 2015-06-21 NOTE — ED Notes (Signed)
Urine  Obtained      And  Sent   To lab

## 2015-06-21 NOTE — ED Notes (Signed)
Pt  Reports    Back  Pain      For  About  8  Days         States  She  Lifted  A  Heavy  Air  Conditioner  And  Felt some  Pain    She  Reports  The  Pain is  Worse  On  Movement  And  posistion

## 2015-06-26 ENCOUNTER — Telehealth: Payer: Self-pay | Admitting: Internal Medicine

## 2015-06-26 NOTE — Telephone Encounter (Signed)
Pt called in stating that she has been experiencing her heart skipping a beat for the past 4 days and she says her Propanolol has not been working. She would like to be seen early tomorrow morning if possible. Please f/u with her  Thanks

## 2015-06-26 NOTE — Telephone Encounter (Signed)
Pt has had PVCs/skipped beats for about 4 days. She had been taking propranolol 10mg  PRN but w/ these events has returned to taking TID as prescribed.  No SOB, no symptoms o/t palpitations.  Pt would like to be seen by provider. She states it would need to be AM appt d/t work at [employer].

## 2015-06-26 NOTE — Telephone Encounter (Signed)
Pt was scheduled with Nell Range tomorrow at 10:30am

## 2015-06-26 NOTE — Progress Notes (Signed)
Cardiology Office Note   Date:  06/26/2015   ID:  Sheri [name], DOB 1950-11-25, MRN [id number]  PCP:  Pcp Not In System  Cardiologist:   Dr. Debara Pickett   palpitations    History of Present Illness: Sheri [name] is a 64 y.o. female with a history of anxiety/panic attacks and PAF who is added onto my clinic schedule for palpitations.   She admitted in 02/2013 for A fib with RVR she was placed on procainamide drip. She then converted to SR. She had echo done which was generally normal. She does report 2 of her sisters with the same disorder - they have both undergone a-fib ablations which have successfully terminated their arrhythmias. She is treated for anxiety and panic attacks with seroquel, trazadone, effexor and xanax.  She is followed by Dr. Debara Pickett and last seen on 06/12/14 with worsening of her palpitations. She had been on PRN propranolol. He switched her to long acting but she could not afford due to lack on insurance. So she started taking propranolol daily. He also recommended starting low dose ASA. We have referred her to Dr. Rayann Heman, however she does not have health insurance and was unable to make that visit. Based on her family history, she may be a good candidate for ablation.  She notes skipped heart beats for the past 4 days mostly at night and after meals. She has had this in the past but not this often. This is different than her afib. She takes a propranolol and this doesn't seem to help. She doesn't drink caffeine or alcohol at all. Wants to know if these are dangerous. She still does not have insurance but her birthday is in [birth year] and she will have medicare.   Past Medical History  Diagnosis Date  . Anxiety   . Depression     treated at [facility]  . Poisoning by selective serotonin reuptake inhibitors(969.03) Jan 2007    severe reaction with MAOI (nardil) and dextromethorphan  . Goiter     hx of goiter  . Allergic rhinitis   . Chronic sinusitis   .  DEGENERATIVE JOINT DISEASE, HANDS     several images revealing OA  . Palpitations     Associated with difficulty breathing  . Tachycardia   . Panic attacks   . Atrial fibrillation with RVR ([place])   . Hx of atrial tachycardia since age 25 03/15/2013    Past Surgical History  Procedure Laterality Date  . Right mastoidectomy      ET tube placement in 1980 and 1990  . Thyroidectomy, partial      nodule removal - April 1996  . Right sinus surgery  october 2000  . Ovarian cyst removal  march 1969    left ovarian cystectomy  . Appendectomy       Current Outpatient Prescriptions  Medication Sig Dispense Refill  . albuterol (PROVENTIL HFA;VENTOLIN HFA) 108 (90 BASE) MCG/ACT inhaler Inhale 2 puffs into the lungs every 6 (six) hours as needed for wheezing or shortness of breath. 1 Inhaler 0  . ALPRAZolam (XANAX) 0.5 MG tablet Take 0.5 mg by mouth 3 (three) times daily as needed for anxiety.     Marland Kitchen aspirin EC 81 MG tablet Take 81 mg by mouth daily.    . cyclobenzaprine (FLEXERIL) 5 MG tablet Take 1 tablet (5 mg total) by mouth 3 (three) times daily as needed for muscle spasms. 30 tablet 0  . diclofenac (CATAFLAM) 50 MG tablet Take  1 tablet (50 mg total) by mouth 3 (three) times daily. For back pain 30 tablet 0  . gabapentin (NEURONTIN) 300 MG capsule Take 300 mg by mouth 3 (three) times daily.      . Omeprazole 20 MG TBEC Take 1 tablet (20 mg total) by mouth daily before breakfast. 30 each 2  . propranolol (INDERAL) 10 MG tablet Take 1 tablet (10 mg total) by mouth 3 (three) times daily. 90 tablet 3  . QUEtiapine (SEROQUEL) 25 MG tablet Take 12.5 mg by mouth at bedtime.     . traZODone (DESYREL) 100 MG tablet Take 100 mg by mouth at bedtime.    Marland Kitchen venlafaxine XR (EFFEXOR-XR) 75 MG 24 hr capsule Take 75 mg by mouth 2 (two) times daily. Patient takes one tablet in the morning and one at lunch time.     No current facility-administered medications for this visit.    Allergies:   Guaifenesin     Social History:  The patient  reports that she has never smoked. She has never used smokeless tobacco. She reports that she does not drink alcohol or use illicit drugs.   Family History:  The patient's family history includes Atrial fibrillation in her sister and sister; Hypertension in her father and mother; Psoriasis in her father.    ROS:  Please see the history of present illness.   Otherwise, review of systems are positive for none.   All other systems are reviewed and negative.    PHYSICAL EXAM: VS:  There were no vitals taken for this visit. , BMI There is no weight on file to calculate BMI. GEN: Well nourished, well developed, in no acute distress HEENT: normal Neck: no JVD, carotid bruits, or masses Cardiac: RRR; no murmurs, rubs, or gallops,no edema  Respiratory:  clear to auscultation bilaterally, normal work of breathing GI: soft, nontender, nondistended, + BS MS: no deformity or atrophy Skin: warm and dry, no rash Neuro:  Strength and sensation are intact Psych: euthymic mood, full affect   EKG:  EKG is ordered today. The ekg ordered today demonstrates NSR with no arrhythmia    Recent Labs: 11/15/2014: ALT 17; BUN 13; Creat 0.74; Hemoglobin 14.9; Platelets 369; Potassium 4.8; Sodium 140; TSH 1.815    Lipid Panel    Component Value Date/Time   CHOL 111 03/16/2013 0355   TRIG 70 03/16/2013 0355   HDL 39* 03/16/2013 0355   CHOLHDL 2.8 03/16/2013 0355   VLDL 14 03/16/2013 0355   LDLCALC 58 03/16/2013 0355      Wt Readings from Last 3 Encounters:  11/15/14 150 lb 14.4 oz (68.448 kg)  10/19/14 153 lb (69.4 kg)  06/12/14 148 lb 4.8 oz (67.268 kg)      Other studies Reviewed: Additional studies/ records that were reviewed today include: 2D ECHO Review of the above records demonstrates:   2D ECHO 03/15/2013 LV EF: 55% -  60% Study Conclusions - Left ventricle: The cavity size was normal. Wall thickness was increased in a pattern of mild LVH.  Systolic function was normal. The estimated ejection fraction was in the range of 55% to 60%. Wall motion was normal; there were no regional wall motion abnormalities. Left ventricular diastolic function parameters were normal. - Left atrium: The atrium was normal in size. - Tricuspid valve: Poorly visualized. Trivial regurgitation. - Pulmonary arteries: PA peak pressure: 68mm Hg (S). - Inferior vena cava: The vessel was normal in size; the respirophasic diameter changes were in the normal range (= 50%);  findings are consistent with normal central venous pressure.   ASSESSMENT AND PLAN:  Sheri [name] is a 64 y.o. female with a history of anxiety/panic attacks and PAF who is added onto my clinic schedule for palpitations.   Paroxysmal atrial fibrillation - not on anticoagulation: CHADSVASC score 1 for female sex, almost 2 for age >4 (in 80 month).  Will have to discuss long term AC after that time. Continue ASA for now.  --  She would like to be off all medications and is very adamant about wanting an ablation for afib like her sisters ( 2 sisters who have had AF ablation). I discussed the need to fail anti arrhythmic therapy before being able to proceed with ablation- she did not like this. We will let her Medicare kick in early next year and then get an appointment with one of our EP doctors to talk about next step. She may be a good flecainide candidate and if she fails this, possible ablation.   Palpitations- these sound like symptomatic PVCs. ECG reveals NSR. I have started her on metoprolol tart 25mg  BID although i do not think she will tolerate this as she has not in the past. I reassured her that PVCs are not dangerous. If she does not tolerate the medicine she does not have to take and she can go back to her PRN propranolol  Current medicines are reviewed at length with the patient today.  The patient does not have concerns regarding medicines.  The following changes  have been made:  Start lopressor 25mg  BID.   Labs/ tests ordered today include:  No orders of the defined types were placed in this encounter.     Disposition:   FU with Dr. [name] in early [place], [place], [place], [place]  06/26/2015 3:51 PM    [address], [place], [place]  [address] Phone: [phone]; Fax: [phone]

## 2015-06-27 ENCOUNTER — Ambulatory Visit (INDEPENDENT_AMBULATORY_CARE_PROVIDER_SITE_OTHER): Payer: Self-pay | Admitting: Physician Assistant

## 2015-06-27 ENCOUNTER — Encounter: Payer: Self-pay | Admitting: Physician Assistant

## 2015-06-27 VITALS — BP 108/60 | HR 80 | Ht 65.5 in | Wt 149.8 lb

## 2015-06-27 DIAGNOSIS — I4891 Unspecified atrial fibrillation: Secondary | ICD-10-CM

## 2015-06-27 MED ORDER — METOPROLOL TARTRATE 25 MG PO TABS: 25.0000 mg | ORAL_TABLET | Freq: Two times a day (BID) | ORAL | Status: DC

## 2015-06-27 NOTE — Patient Instructions (Addendum)
Medication Instructions:  Your physician has recommended you make the following change in your medication:  1.  START Metoprolol  (Lopressor) 25 mg taking 1 tablet twice a day    Labwork: None ordered   Testing/Procedures: None ordered  Follow-Up: Your physician recommends that you schedule a follow-up appointment in:  February 2017 WITH DR. [name].     Any Other Special Instructions Will Be Listed Below (If Applicable).     If you need a refill on your cardiac medications before your next appointment, please call your pharmacy.

## 2015-07-06 ENCOUNTER — Telehealth: Payer: Self-pay | Admitting: Physician Assistant

## 2015-07-06 NOTE — Telephone Encounter (Signed)
New problem    Pt was told to stop taking propanolol and start taking metoprolol and pt stated she want to go back to the propanolol because it makes her feel better.

## 2015-07-06 NOTE — Telephone Encounter (Signed)
Pt was told to stop taking propanolol and start taking metoprolol and pt stated she want to go back to the propanolol because it makes her feel better.   Please advise

## 2015-07-07 NOTE — Telephone Encounter (Signed)
Tell her that is fine. Follow up with EP when she had insurance in the new year as previously planned

## 2015-07-19 MED ORDER — PROPRANOLOL HCL 10 MG PO TABS: 10.0000 mg | ORAL_TABLET | Freq: Three times a day (TID) | ORAL | Status: DC | PRN

## 2015-07-19 NOTE — Telephone Encounter (Signed)
°*  STAT* If patient is at the pharmacy, call can be transferred to refill team.   1. Which medications need to be refilled? (please list name of each medication and dose if known) propanolol 10mg   2. Which pharmacy/location (including street and city if local pharmacy) is medication to be sent to?[place] [phone]  3. Do they need a 30 day or 90 day supply? 80 pills

## 2015-07-19 NOTE — Addendum Note (Signed)
Addended by: Roberts Gaudy on: 07/19/2015 04:17 PM   Modules accepted: Orders

## 2015-07-19 NOTE — Telephone Encounter (Signed)
Message     She can take PRN TID.

## 2015-07-19 NOTE — Telephone Encounter (Signed)
*  STAT* If patient is at the pharmacy, call can be transferred to refill team.   1. Which medications need to be refilled? (please list name of each medication and dose if known) propanolol 10mg   2. Which pharmacy/location (including street and city if local pharmacy) is medication to be sent to?[place] [phone]  3. Do they need a 30 day or 90 day supply? 80 pills  ROUTING TO [place] ADVISEMENT ON DOSAGE FOR THIS MEDICATION.

## 2015-07-20 ENCOUNTER — Telehealth: Payer: Self-pay | Admitting: Cardiology

## 2015-07-20 NOTE — Telephone Encounter (Signed)
Patient c/o Palpitations:  High priority if patient c/o lightheadedness and shortness of breath.  1. How long have you been having palpitations? Heart racing for last 3 hours  2. Are you currently experiencing lightheadedness and shortness of breath?didn't answer  3. Have you checked your BP and heart rate? (document readings) didn't answer  4. Are you experiencing any other symptoms? Didn't answer   In the middle of our conversation pt stated he was going to the hospital because her heart was racing and stated she didn't want to speak to anyone. Wanted to document because she did call our office first.

## 2015-07-20 NOTE — Telephone Encounter (Signed)
Called pt regarding her call in about palpitations.  She states she doesn't know why we called back.  She is fine now.  States she has taken 40 mg of Propranolol and about 10 min ago palpitations stopped.  Before I could question her further she states she had to go and hung up phone.

## 2015-08-24 ENCOUNTER — Other Ambulatory Visit: Payer: Self-pay

## 2015-08-24 DIAGNOSIS — R2231 Localized swelling, mass and lump, right upper limb: Secondary | ICD-10-CM

## 2015-08-27 NOTE — Progress Notes (Signed)
Electrophysiology Office Note   Date:  08/28/2015   ID:  REVE WAREING, DOB 11-14-1950, MRN [id number]  PCP:  Pcp Not In System  Cardiologist:  [facility] Primary Electrophysiologist:  Damoni Erker [name], MD    Chief Complaint  Patient presents with  . Follow-up     History of Present Illness: Sheri [name] is a 65 y.o. female who presents today for electrophysiology evaluation.   She has a history of atrial fibrillation.    She admitted in 02/2013 for A fib with RVR she was placed on procainamide drip. She then converted to SR. She had echo done which was generally normal. She does report 2 of her sisters with the same disorder - they have both undergone a-fib ablations which have successfully terminated their arrhythmias. She is treated for anxiety and panic attacks with seroquel, trazadone, effexor and xanax.  She is followed by Dr. Debara Pickett and last seen on 06/12/14 with worsening of her palpitations. She started taking propranolol daily. He also recommended starting low dose ASA. We have referred her to Dr. Rayann Heman, however she does not have health insurance and was unable to make that visit. Based on her family history, she may be a good candidate for ablation.  She notes skipped heart beats for the past 4 days mostly at night and after meals. She has had this in the past but not this often. This is different than her afib. She takes a propranolol and this doesn't seem to help. She doesn't drink caffeine or alcohol at all.   Today, she denies symptoms of palpitations, chest pain, shortness of breath, orthopnea, PND, lower extremity edema, claudication, dizziness, presyncope, syncope, bleeding, or neurologic sequela. The patient is tolerating medications without difficulties and is otherwise without complaint today. She gets her palpitations at random times during the day.  The occur at times monthly but also occur more often than that.  She has had to go to the hospital at times for this.   Past  Medical History  Diagnosis Date  . Anxiety   . Depression     treated at [facility]  . Poisoning by selective serotonin reuptake inhibitors(969.03) Jan 2007    severe reaction with MAOI (nardil) and dextromethorphan  . Goiter     hx of goiter  . Allergic rhinitis   . Chronic sinusitis   . DEGENERATIVE JOINT DISEASE, HANDS     several images revealing OA  . Palpitations     Associated with difficulty breathing  . Tachycardia   . Panic attacks   . Atrial fibrillation with RVR ([place])   . Hx of atrial tachycardia since age 21 03/15/2013   Past Surgical History  Procedure Laterality Date  . Right mastoidectomy      ET tube placement in 1980 and 1990  . Thyroidectomy, partial      nodule removal - April 1996  . Right sinus surgery  october 2000  . Ovarian cyst removal  march 1969    left ovarian cystectomy  . Appendectomy       Current Outpatient Prescriptions  Medication Sig Dispense Refill  . ALPRAZolam (XANAX) 0.5 MG tablet Take 0.5 mg by mouth 3 (three) times daily as needed for anxiety.     Marland Kitchen aspirin EC 81 MG tablet Take 81 mg by mouth daily.    Marland Kitchen gabapentin (NEURONTIN) 300 MG capsule Take 300 mg by mouth 3 (three) times daily.      . propranolol (INDERAL) 10 MG tablet  Take 1 tablet (10 mg total) by mouth 3 (three) times daily as needed (palpitations). 30 tablet 1  . QUEtiapine (SEROQUEL) 25 MG tablet Take 12.5 mg by mouth at bedtime.     . traZODone (DESYREL) 100 MG tablet Take 100 mg by mouth at bedtime.    Marland Kitchen venlafaxine XR (EFFEXOR-XR) 75 MG 24 hr capsule Take 75 mg by mouth 2 (two) times daily. Patient takes one tablet in the morning and one at lunch time.     No current facility-administered medications for this visit.    Allergies:   Guaifenesin   Social History:  The patient  reports that she has never smoked. She has never used smokeless tobacco. She reports that she does not drink alcohol or use illicit drugs.   Family History:  The patient's family  history includes Atrial fibrillation in her sister and sister; Hypertension in her father and mother; Psoriasis in her father.    ROS:  Please see the history of present illness.   Otherwise, review of systems is positive for palpitations, DOE, anxiety.   All other systems are reviewed and negative.    PHYSICAL EXAM: VS:  Ht 5' 5.5" (1.664 m)  Wt 150 lb 6.4 oz (68.221 kg)  BMI 24.64 kg/m2 , BMI Body mass index is 24.64 kg/(m^2). GEN: Well nourished, well developed, in no acute distress HEENT: normal Neck: no JVD, carotid bruits, or masses Cardiac: RRR; no murmurs, rubs, or gallops,no edema  Respiratory:  clear to auscultation bilaterally, normal work of breathing GI: soft, nontender, nondistended, + BS MS: no deformity or atrophy Skin: warm and dry Neuro:  Strength and sensation are intact Psych: euthymic mood, full affect  EKG:  EKG is ordered today. The ekg ordered today shows sinus rhythm, rate 96, inferior Q waves  Recent Labs: 11/15/2014: ALT 17; BUN 13; Creat 0.74; Hemoglobin 14.9; Platelets 369; Potassium 4.8; Sodium 140; TSH 1.815    Lipid Panel     Component Value Date/Time   CHOL 111 03/16/2013 0355   TRIG 70 03/16/2013 0355   HDL 39* 03/16/2013 0355   CHOLHDL 2.8 03/16/2013 0355   VLDL 14 03/16/2013 0355   LDLCALC 58 03/16/2013 0355     Wt Readings from Last 3 Encounters:  08/28/15 150 lb 6.4 oz (68.221 kg)  06/27/15 149 lb 12.8 oz (67.949 kg)  11/15/14 150 lb 14.4 oz (68.448 kg)      Other studies Reviewed: Additional studies/ records that were reviewed today include: TTE 2014  Review of the above records today demonstrates:  - Left ventricle: The cavity size was normal. Wall thickness was increased in a pattern of mild LVH. Systolic function was normal. The estimated ejection fraction was in the range of 55% to 60%. Wall motion was normal; there were no regional wall motion abnormalities. Left ventricular diastolic function parameters were  normal. - Left atrium: The atrium was normal in size. - Tricuspid valve: Poorly visualized. Trivial regurgitation. - Pulmonary arteries: PA peak pressure: 68mm Hg (S). - Inferior vena cava: The vessel was normal in size; the respirophasic diameter changes were in the normal range  50%); findings are consistent with normal central venous pressure.   ASSESSMENT AND PLAN:  1.  Paroxysmal atrial fibrillation: Has not yet been on antiarrhythmics.  Unfortunately her medications for depression have significant reactions with her possibility is an antiarrhythmic medications. We are therefore unable to use antiarrhythmics other than amiodarone. Due to that we Denney Shein plan on ablation. I have discussed with her  the risks and benefits of ablation, risks include bleeding, tamponade, heart block, stroke, and damage to surrounding structures. She has agreed to this and we Serenity Batley plan on ablation at the soonest time. Her CHADS2VASc is 2 and therefore requires anticoagulation.  Have discussed risks and benefits of NOACs vs coumadin. She is now insured and would like to try Xarelto.  Tanner Vigna start that today for stroke prevention.  This patients CHA2DS2-VASc Score and unadjusted Ischemic Stroke Rate (% per year) is equal to 2.2 % stroke rate/year from a score of 2  Above score calculated as 1 point each if present [CHF, HTN, DM, Vascular=MI/PAD/Aortic Plaque, Age if 65-74, or Female] Above score calculated as 2 points each if present [Age > 75, or Stroke/TIA/TE]  Current medicines are reviewed at length with the patient today.   The patient does not have concerns regarding her medicines.  The following changes were made today:  Xarelto, flecainide  Labs/ tests ordered today include:  No orders of the defined types were placed in this encounter.     Disposition:   FU with Miracle Criado  3 months  Signed, Toneshia Coello [name], MD  08/28/2015 9:48 AM     [facility] [address] [place] [address] [phone] (office) [phone] (fax)

## 2015-08-28 ENCOUNTER — Encounter: Payer: Self-pay | Admitting: Cardiology

## 2015-08-28 ENCOUNTER — Ambulatory Visit (INDEPENDENT_AMBULATORY_CARE_PROVIDER_SITE_OTHER): Payer: Medicare HMO | Admitting: Cardiology

## 2015-08-28 VITALS — BP 94/66 | HR 96 | Ht 65.5 in | Wt 150.4 lb

## 2015-08-28 DIAGNOSIS — Z79899 Other long term (current) drug therapy: Secondary | ICD-10-CM | POA: Diagnosis not present

## 2015-08-28 DIAGNOSIS — I480 Paroxysmal atrial fibrillation: Secondary | ICD-10-CM | POA: Diagnosis not present

## 2015-08-28 LAB — BASIC METABOLIC PANEL
BUN: 13 mg/dL (ref 7–25)
CHLORIDE: 107 mmol/L (ref 98–110)
CO2: 24 mmol/L (ref 20–31)
Calcium: 9.1 mg/dL (ref 8.6–10.4)
Creat: 0.72 mg/dL (ref 0.50–0.99)
GLUCOSE: 94 mg/dL (ref 65–99)
POTASSIUM: 4 mmol/L (ref 3.5–5.3)
SODIUM: 142 mmol/L (ref 135–146)

## 2015-08-28 MED ORDER — PROPRANOLOL HCL 10 MG PO TABS: 10.0000 mg | ORAL_TABLET | Freq: Three times a day (TID) | ORAL | Status: DC

## 2015-08-28 NOTE — Patient Instructions (Addendum)
Medication Instructions:  Your physician has recommended you make the following change in your medication:  1) STOP Aspirin 2) START Xarelto - we will contact you after reviewing lab result to determine dosage of this medication 3) CHANGE Propranolol to 10 mg three times a day.  Labwork: BMET today  Testing/Procedures: Your physician has recommended that you have an ablation. Catheter ablation is a medical procedure used to treat some cardiac arrhythmias (irregular heartbeats). During catheter ablation, a long, thin, flexible tube is put into a blood vessel in your groin (upper thigh), or neck. This tube is called an ablation catheter. It is then guided to your heart through the blood vessel. Radio frequency waves destroy small areas of heart tissue where abnormal heartbeats may cause an arrhythmia to start.   Makailyn Mccormick, RN will contact you to arrange this procedure.  Follow-Up: To be determined once ablation is scheduled.  If you need a refill on your cardiac medications before your next appointment, please call your pharmacy.  Thank you for choosing [facility]!!    Any Other Special Instructions Will Be Listed Below (If Applicable).  Cardiac Ablation Cardiac ablation is a procedure to disable a small amount of heart tissue in very specific places. The heart has many electrical connections. Sometimes these connections are abnormal and can cause the heart to beat very fast or irregularly. By disabling some of the problem areas, heart rhythm can be improved or made normal. Ablation is done for people who:   Have Wolff-Parkinson-White syndrome.   Have other fast heart rhythms (tachycardia).   Have taken medicines for an abnormal heart rhythm (arrhythmia) that resulted in:   No success.   Side effects.   May have a high-risk heartbeat that could result in death.  LET [facility] CARE PROVIDER KNOW ABOUT:   Any allergies you have or any previous reactions you have had to  X-ray dye, food (such as seafood), medicine, or tape.   All medicines you are taking, including vitamins, herbs, eye drops, creams, and over-the-counter medicines.   Previous problems you or members of your family have had with the use of anesthetics.   Any blood disorders you have.   Previous surgeries or procedures (such as a kidney transplant) you have had.   Medical conditions you have (such as kidney failure).  RISKS AND COMPLICATIONS Generally, cardiac ablation is a safe procedure. However, problems can occur and include:   Increased risk of cancer. Depending on how long it takes to do the ablation, the dose of radiation can be high.  Bruising and bleeding where a thin, flexible tube (catheter) was inserted during the procedure.   Bleeding into the chest, especially into the sac that surrounds the heart (serious).  Need for a permanent pacemaker if the normal electrical system is damaged.   The procedure may not be fully effective, and this may not be recognized for months. Repeat ablation procedures are sometimes required. BEFORE THE PROCEDURE   Follow any instructions from your health care provider regarding eating and drinking before the procedure.   Take your medicines as directed at regular times with water, unless instructed otherwise by your health care provider. If you are taking diabetes medicine, including insulin, ask how you are to take it and if there are any special instructions you should follow. It is common to adjust insulin dosing the day of the ablation.  PROCEDURE  An ablation is usually performed in a catheterization laboratory with the guidance of fluoroscopy. Fluoroscopy is a  type of X-ray that helps your health care provider see images of your heart during the procedure.   An ablation is a minimally invasive procedure. This means a small cut (incision) is made in either your neck or groin. Your health care provider will decide where to make  the incision based on your medical history and physical exam.  An IV tube will be started before the procedure begins. You will be given an anesthetic or medicine to help you relax (sedative).  The skin on your neck or groin will be numbed. A needle will be inserted into a large vein in your neck or groin and catheters will be threaded to your heart.  A special dye that shows up on fluoroscopy pictures may be injected through the catheter. The dye helps your health care provider see the area of the heart that needs treatment.  The catheter has electrodes on the tip. When the area of heart tissue that is causing the arrhythmia is found, the catheter tip will send an electrical current to the area and "scar" the tissue. Three types of energy can be used to ablate the heart tissue:   Heat (radiofrequency energy).   Laser energy.   Extreme cold (cryoablation).   When the area of the heart has been ablated, the catheter will be taken out. Pressure will be held on the insertion site. This will help the insertion site clot and keep it from bleeding. A bandage will be placed on the insertion site.  AFTER THE PROCEDURE   After the procedure, you will be taken to a recovery area where your vital signs (blood pressure, heart rate, and breathing) will be monitored. The insertion site will also be monitored for bleeding.   You will need to lie still for 4-6 hours. This is to ensure you do not bleed from the catheter insertion site.    This information is not intended to replace advice given to you by your health care provider. Make sure you discuss any questions you have with your health care provider.   Document Released: 11/23/2008 Document Revised: 07/28/2014 Document Reviewed: 11/29/2012 Elsevier Interactive Patient Education [employer].

## 2015-08-29 ENCOUNTER — Other Ambulatory Visit: Payer: Self-pay | Admitting: *Deleted

## 2015-08-29 DIAGNOSIS — I4819 Other persistent atrial fibrillation: Secondary | ICD-10-CM

## 2015-08-29 DIAGNOSIS — Z01812 Encounter for preprocedural laboratory examination: Secondary | ICD-10-CM

## 2015-08-30 ENCOUNTER — Encounter: Payer: Self-pay | Admitting: *Deleted

## 2015-08-30 ENCOUNTER — Telehealth: Payer: Self-pay | Admitting: *Deleted

## 2015-08-30 NOTE — Telephone Encounter (Signed)
Scheduled Afib ablation 10/12/15. Letter of instructions reviewed with patient and mailed to home address ([address] [place], [place]). Pre procedure labs to be arranged when they call to schedule CT scanning. Post ablation follow up arranged. Patient verbalized understanding and agreeable to plan.

## 2015-08-30 NOTE — Telephone Encounter (Signed)
lmtcb to review procedure instructions and arrange post ablation follow up

## 2015-09-03 ENCOUNTER — Telehealth: Payer: Self-pay | Admitting: *Deleted

## 2015-09-03 NOTE — Telephone Encounter (Signed)
Pt calls and states in recent months she has gained "a lot of weight", when ask how much she states weight is now 150 and is usually a lot less, at last appt 10/2014 wt was 150 lbs14oz. She denies shortness of breath, chest pain, h/a, n&v- she states the gain is on "one side of my body". appt was offered for 3/15 dr Randell Patient who is her pcp but she refused

## 2015-09-05 ENCOUNTER — Telehealth: Payer: Self-pay | Admitting: Internal Medicine

## 2015-09-05 NOTE — Telephone Encounter (Signed)
APPT REMINDER CALL, LMTCB IF SHE NEEDS TO CANCEL °

## 2015-09-06 ENCOUNTER — Encounter: Payer: Self-pay | Admitting: Internal Medicine

## 2015-09-06 ENCOUNTER — Ambulatory Visit: Payer: Medicare HMO | Admitting: Internal Medicine

## 2015-09-12 ENCOUNTER — Encounter: Payer: Self-pay | Admitting: Cardiology

## 2015-09-17 ENCOUNTER — Telehealth: Payer: Self-pay | Admitting: Internal Medicine

## 2015-09-17 NOTE — Telephone Encounter (Signed)
APPT REMINDER CALL, LMTCB IF SHE NEEDS TO CANCEL °

## 2015-09-18 ENCOUNTER — Encounter: Payer: Self-pay | Admitting: Internal Medicine

## 2015-09-18 ENCOUNTER — Ambulatory Visit (INDEPENDENT_AMBULATORY_CARE_PROVIDER_SITE_OTHER): Payer: Medicare HMO | Admitting: Internal Medicine

## 2015-09-18 VITALS — BP 104/63 | HR 93 | Temp 98.5°F | Wt 154.4 lb

## 2015-09-18 DIAGNOSIS — N958 Other specified menopausal and perimenopausal disorders: Secondary | ICD-10-CM | POA: Diagnosis not present

## 2015-09-18 DIAGNOSIS — N898 Other specified noninflammatory disorders of vagina: Secondary | ICD-10-CM

## 2015-09-18 DIAGNOSIS — R635 Abnormal weight gain: Secondary | ICD-10-CM

## 2015-09-18 DIAGNOSIS — Z6825 Body mass index (BMI) 25.0-25.9, adult: Secondary | ICD-10-CM

## 2015-09-18 DIAGNOSIS — N760 Acute vaginitis: Secondary | ICD-10-CM

## 2015-09-18 DIAGNOSIS — N393 Stress incontinence (female) (male): Secondary | ICD-10-CM

## 2015-09-18 DIAGNOSIS — Z1239 Encounter for other screening for malignant neoplasm of breast: Secondary | ICD-10-CM

## 2015-09-18 DIAGNOSIS — Z0000 Encounter for general adult medical examination without abnormal findings: Secondary | ICD-10-CM

## 2015-09-18 NOTE — Progress Notes (Signed)
Patient ID: Sheri [name], female   DOB: [birth year], 65 y.o.   MRN: [id number]    Subjective:   Patient ID: Sheri [name] female   DOB: 1951/05/04 65 y.o.   MRN: [id number]  HPI: [name] is a 65 y.o. woman with past medical history of afib, depression/anxiety, and GERD who presents with chief complaint of vaginal irritation.   She is menopausal and reports having vaginal irritation with dryness, mild itching, mild bleeding, and clear discharge for the past few weeks with no dysuria, odor, or lower abdominal pain. She reports her vaginal area being "wet" and irritation extending to her anal region. She has occasional urinary leakage with coughing or sneezing. She wipes from front to back. She does not douche.  She is not sexually active and had negative pap smear testing on 11/15/14. She had possible labial lesion vs abscess in 08/2012 which resolved on its own. She was referred to gynecology at that time but never attended the appointment.   She also reports weight gain (4 lb since 10 months ago) with poor diet and exercise. Her BMI is 25. She reports being thin for most of her life and worried about gaining weight. She also reports asymmetric fat distrubution in her abdomen (right more than left). She has had abdominal surgery in the past. Her last TSH level was normal on 11/15/15. She is not diabetic. She has history of depression and is on venlafaxine and seroquel.  She would like to have screening mammogram. She has poor dentition and would like referral to dentistry.      Past Medical History  Diagnosis Date  . Anxiety   . Depression     treated at [facility]  . Poisoning by selective serotonin reuptake inhibitors(969.03) Jan 2007    severe reaction with MAOI (nardil) and dextromethorphan  . Goiter     hx of goiter  . Allergic rhinitis   . Chronic sinusitis   . DEGENERATIVE JOINT DISEASE, HANDS     several images revealing OA  . Palpitations     Associated with difficulty  breathing  . Tachycardia   . Panic attacks   . Atrial fibrillation with RVR ([place])   . Hx of atrial tachycardia since age 60 03/15/2013   Current Outpatient Prescriptions  Medication Sig Dispense Refill  . ALPRAZolam (XANAX) 0.5 MG tablet Take 0.5 mg by mouth 3 (three) times daily as needed for anxiety.     . gabapentin (NEURONTIN) 300 MG capsule Take 300 mg by mouth 3 (three) times daily.      . propranolol (INDERAL) 10 MG tablet Take 1 tablet (10 mg total) by mouth 3 (three) times daily. 270 tablet 1  . QUEtiapine (SEROQUEL) 25 MG tablet Take 12.5 mg by mouth at bedtime.     . traZODone (DESYREL) 100 MG tablet Take 100 mg by mouth at bedtime.    Marland Kitchen venlafaxine XR (EFFEXOR-XR) 75 MG 24 hr capsule Take 75 mg by mouth 2 (two) times daily. Patient takes one tablet in the morning and one at lunch time.     No current facility-administered medications for this visit.   Family History  Problem Relation Age of Onset  . Hypertension Mother   . Hypertension Father   . Psoriasis Father   . Atrial fibrillation Sister     had a successful a fib ablation  . Atrial fibrillation Sister     had a successful a fib ablation   Social History  Social History  . Marital Status: Widowed    Spouse Name: N/A  . Number of Children: N/A  . 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 Smoking status: Never Smoker   . Smokeless tobacco: Never Used  . Alcohol Use: No  . Drug Use: No  . Sexual Activity: Not Asked   Other Topics Concern  . None   Social History Narrative   Lives alone.   Review of Systems: Review of Systems  Constitutional: Negative for fever and chills.  Eyes: Negative for blurred vision.  Respiratory: Negative for cough, shortness of breath and wheezing.   Cardiovascular: Negative for chest pain and leg swelling.  Gastrointestinal: Negative for nausea, vomiting, abdominal pain, diarrhea and  constipation.  Genitourinary: Negative for dysuria, urgency, frequency and hematuria.       Vaginal irritation  Neurological: Negative for dizziness and headaches.  Psychiatric/Behavioral: Positive for depression. The patient is nervous/anxious.     Objective:  Physical Exam: Filed Vitals:   09/18/15 1445  BP: 104/63  Pulse: 93  Temp: 98.5 F (36.9 C)  TempSrc: Oral  Weight: 154 lb 6.4 oz (70.035 kg)  SpO2: 96%    Physical Exam  Constitutional: She is oriented to person, place, and time. She appears well-developed and well-nourished. No distress.  Cat urine odor  HENT:  Head: Normocephalic and atraumatic.  Right Ear: External ear normal.  Left Ear: External ear normal.  Nose: Nose normal.  Mouth/Throat: Oropharynx is clear and moist. No oropharyngeal exudate.  Poor dentition with multiple decayed teeth  Eyes: Conjunctivae and EOM are normal. Pupils are equal, round, and reactive to light. Right eye exhibits no discharge. Left eye exhibits no discharge. No scleral icterus.  Neck: Normal range of motion. Neck supple.  Cardiovascular: Normal rate, regular rhythm and normal heart sounds.   Pulmonary/Chest: Effort normal and breath sounds normal. No respiratory distress. She has no wheezes. She has no rales.  Abdominal: Soft. Bowel sounds are normal. She exhibits distension (obese). There is no tenderness. There is no rebound.  Genitourinary: No vaginal discharge found.  Labial region with erythema and mild punctate bleeding. Left labial fold atrophy.   Musculoskeletal: Normal range of motion. She exhibits no edema or tenderness.  Neurological: She is alert and oriented to person, place, and time.  Skin: Skin is warm and dry. No rash noted. She is not diaphoretic. No erythema. No pallor.  Psychiatric: Her behavior is normal. Judgment and thought content normal.  Anxious    Assessment & Plan:   Please see problem list for problem-based assessment and plan

## 2015-09-18 NOTE — Patient Instructions (Signed)
-  Will refer you to gynecology, you may have atrophic vaginitis please keep the area dry as it may be irritating your vaginal region -Your BMI is 25 which is actually at goal weight, try to diet and exercise, we can refer you to [facility] if you would like   -Will schedule you for mammogram -Nice meeting you!  Atrophic Vaginitis Atrophic vaginitis is when the tissues that line the vagina become dry and thin. This is caused by a drop in estrogen. Estrogen helps:  To keep the vagina moist.  To make a clear fluid that helps:  To lubricate the vagina for sex.  To protect the vagina from infection. If the lining of the vagina is dry and thin, it may:  Make sex painful. It may also cause bleeding.  Cause a feeling of:  Burning.  Irritation.  Itchiness.  Make an exam of your vagina painful. It may also cause bleeding.  Make you lose interest in sex.  Cause a burning feeling when you pee.  Make your vaginal fluid (discharge) brown or yellow. For some women, there are no symptoms. This condition is most common in women who do not get their regular menstrual periods anymore (menopause). This often starts when a woman is 36-60 years old. HOME CARE  Take medicines only as told by your doctor. Do not use any herbal or alternative medicines unless your doctor says it is okay.  Use over-the-counter products for dryness only as told by your doctor. These include:  Creams.  Lubricants.  Moisturizers.  Do not douche.  Do not use products that can make your vagina dry. These include:  Scented feminine sprays.  Scented tampons.  Scented soaps.  If it hurts to have sex, tell your sexual partner. GET HELP IF:  Your discharge looks different than normal.  Your vagina has an unusual smell.  You have new symptoms.  Your symptoms do not get better with treatment.  Your symptoms get worse.   This information is not intended to replace advice given to you by your health  care provider. Make sure you discuss any questions you have with your health care provider.   Document Released: 12/24/2007 Document Revised: 11/21/2014 Document Reviewed: 06/28/2014 Elsevier Interactive Patient Education 2016 [place] Instructions:   Please bring your medicines with you each time you come to clinic.  Medicines may include prescription medications, over-the-counter medications, herbal remedies, eye drops, vitamins, or other pills.   Progress Toward Treatment Goals:  No flowsheet data found.  Self Care Goals & Plans:  Self Care Goal 09/08/2012  Manage my medications take my medicines as prescribed; refill my medications on time; bring my medications to every visit    No flowsheet data found.   Care Management & Community Referrals:  Referral 09/08/2012  Referrals made for care management support financial counselor

## 2015-09-21 DIAGNOSIS — R635 Abnormal weight gain: Secondary | ICD-10-CM | POA: Insufficient documentation

## 2015-09-21 NOTE — Assessment & Plan Note (Addendum)
Assessment: Pt with vaginal irritation, erythema, dryness, and atrophy most likely due to atrophic vaginitis in setting of postmenopause and stress urinary incontinence.   Plan:  -Refer to gynecology for further evaluation  -Please see physical exam for vaginal external inspection  -Pt instructed to keep vaginal area dry

## 2015-09-21 NOTE — Assessment & Plan Note (Signed)
Assessment: Pt with BMI 25.29 with 4 lb weight gain in past 10 months with poor diet and exercise on anti-depressive therapy who presents with concern for weight gain.   Plan:  -BMI 25.29 at goal <25 -Pt encouraged on diet and exercise  -Pt to schedule appointment with health coach Butch Penny Plyler -Pt on seroquel and venlafaxine which can both increase weight, pt follows with psychiatry for medical management

## 2015-09-21 NOTE — Assessment & Plan Note (Addendum)
-  Order placed for screening mammogram -Pt given information regarding dental services in the area -Inquire regarding DEXA scan at next visit  -Inquire regarding flu, tdap, PCV13, and zoster vaccinations at next visit -Consider hepatitis C testing at next visit

## 2015-09-22 NOTE — Progress Notes (Signed)
Internal Medicine Clinic Attending  Case discussed with Dr. Rabbani soon after the resident saw the patient.  We reviewed the resident's history and exam and pertinent patient test results.  I agree with the assessment, diagnosis, and plan of care documented in the resident's note.  

## 2015-09-26 ENCOUNTER — Encounter: Payer: Self-pay | Admitting: Obstetrics & Gynecology

## 2015-09-26 ENCOUNTER — Telehealth: Payer: Self-pay | Admitting: Pharmacist

## 2015-09-27 ENCOUNTER — Telehealth: Payer: Self-pay | Admitting: Dietician

## 2015-09-27 NOTE — Telephone Encounter (Signed)
Call back to assist with patient question regarding possible medication-induced weight gain. Reviewed findings with Dr. Naaman Plummer and explained to patient: of the medications she is taking, Seroquel has the highest incidence of weight gain. Trazodone, gabapentin, and venlafaxine have lower incidences.   Advised consideration around tapering the Seroquel given she is only taking 12.5 mg daily. Possible tapering strategy: QOD dosing, then every 2 days etc. Patient also advised she would need to discuss with provider at [facility], she verbalized understanding by repeat back and also requested to work with Debera Lat, CDE for help with diet/lifestyle weight management.

## 2015-09-27 NOTE — Telephone Encounter (Signed)
Pharmacy referred patient for weight gain.

## 2015-09-28 NOTE — Telephone Encounter (Signed)
Appointment set up for 10/30/2015

## 2015-10-03 ENCOUNTER — Ambulatory Visit (HOSPITAL_COMMUNITY): Payer: Medicare HMO

## 2015-10-09 ENCOUNTER — Ambulatory Visit (HOSPITAL_COMMUNITY)
Admission: RE | Admit: 2015-10-09 | Discharge: 2015-10-09 | Disposition: A | Discharge status: Home / Self Care | Payer: Medicare HMO | Source: Ambulatory Visit | Attending: Cardiology | Admitting: Cardiology

## 2015-10-09 ENCOUNTER — Other Ambulatory Visit (INDEPENDENT_AMBULATORY_CARE_PROVIDER_SITE_OTHER): Payer: Medicare HMO | Admitting: *Deleted

## 2015-10-09 ENCOUNTER — Encounter (HOSPITAL_COMMUNITY): Payer: Self-pay

## 2015-10-09 DIAGNOSIS — R911 Solitary pulmonary nodule: Secondary | ICD-10-CM | POA: Diagnosis not present

## 2015-10-09 DIAGNOSIS — I712 Thoracic aortic aneurysm, without rupture: Secondary | ICD-10-CM | POA: Insufficient documentation

## 2015-10-09 DIAGNOSIS — Z01812 Encounter for preprocedural laboratory examination: Secondary | ICD-10-CM | POA: Diagnosis not present

## 2015-10-09 DIAGNOSIS — J9811 Atelectasis: Secondary | ICD-10-CM | POA: Diagnosis not present

## 2015-10-09 DIAGNOSIS — I4819 Other persistent atrial fibrillation: Secondary | ICD-10-CM

## 2015-10-09 DIAGNOSIS — I481 Persistent atrial fibrillation: Secondary | ICD-10-CM | POA: Insufficient documentation

## 2015-10-09 LAB — BASIC METABOLIC PANEL
BUN: 9 mg/dL (ref 7–25)
CHLORIDE: 107 mmol/L (ref 98–110)
CO2: 27 mmol/L (ref 20–31)
Calcium: 8.5 mg/dL — ABNORMAL LOW (ref 8.6–10.4)
Creat: 0.66 mg/dL (ref 0.50–0.99)
Glucose, Bld: 62 mg/dL — ABNORMAL LOW (ref 65–99)
POTASSIUM: 4.1 mmol/L (ref 3.5–5.3)
SODIUM: 141 mmol/L (ref 135–146)

## 2015-10-09 LAB — CBC WITH DIFFERENTIAL/PLATELET
BASOS ABS: 0 10*3/uL (ref 0.0–0.1)
Basophils Relative: 0 % (ref 0–1)
EOS ABS: 0.2 10*3/uL (ref 0.0–0.7)
Eosinophils Relative: 2 % (ref 0–5)
HCT: 39.4 % (ref 36.0–46.0)
Hemoglobin: 13.2 g/dL (ref 12.0–15.0)
LYMPHS ABS: 2.7 10*3/uL (ref 0.7–4.0)
LYMPHS PCT: 35 % (ref 12–46)
MCH: 30.3 pg (ref 26.0–34.0)
MCHC: 33.5 g/dL (ref 30.0–36.0)
MCV: 90.6 fL (ref 78.0–100.0)
MPV: 9.6 fL (ref 8.6–12.4)
Monocytes Absolute: 0.6 10*3/uL (ref 0.1–1.0)
Monocytes Relative: 8 % (ref 3–12)
NEUTROS PCT: 55 % (ref 43–77)
Neutro Abs: 4.2 10*3/uL (ref 1.7–7.7)
PLATELETS: 327 10*3/uL (ref 150–400)
RBC: 4.35 MIL/uL (ref 3.87–5.11)
RDW: 12.7 % (ref 11.5–15.5)
WBC: 7.6 10*3/uL (ref 4.0–10.5)

## 2015-10-09 MED ORDER — METOPROLOL TARTRATE 1 MG/ML IV SOLN
INTRAVENOUS | Status: AC
Start: 2015-10-09 — End: 2015-10-09
  Filled 2015-10-09: qty 10

## 2015-10-09 MED ORDER — NITROGLYCERIN 0.4 MG SL SUBL
SUBLINGUAL_TABLET | SUBLINGUAL | Status: AC
  Filled 2015-10-09: qty 1

## 2015-10-09 MED ORDER — METOPROLOL TARTRATE 1 MG/ML IV SOLN
2.5000 mg | INTRAVENOUS | Status: DC | PRN
  Administered 2015-10-09: 2.5 mg via INTRAVENOUS

## 2015-10-09 MED ORDER — NITROGLYCERIN 0.4 MG SL SUBL
0.4000 mg | SUBLINGUAL_TABLET | SUBLINGUAL | Status: DC | PRN
  Administered 2015-10-09: 0.4 mg via SUBLINGUAL

## 2015-10-09 MED ORDER — IOHEXOL 350 MG/ML SOLN
80.0000 mL | Freq: Once | INTRAVENOUS | Status: AC | PRN
  Administered 2015-10-09: 80 mL via INTRAVENOUS

## 2015-10-09 MED ORDER — METOPROLOL TARTRATE 1 MG/ML IV SOLN
2.5000 mg | Freq: Once | INTRAVENOUS | Status: AC
  Administered 2015-10-09: 2.5 mg via INTRAVENOUS

## 2015-10-12 ENCOUNTER — Encounter (HOSPITAL_COMMUNITY): Payer: Self-pay | Admitting: Certified Registered"

## 2015-10-12 ENCOUNTER — Ambulatory Visit (HOSPITAL_COMMUNITY): Payer: Medicare HMO | Admitting: Certified Registered"

## 2015-10-12 ENCOUNTER — Ambulatory Visit (HOSPITAL_COMMUNITY)
Admission: RE | Admit: 2015-10-12 | Discharge: 2015-10-13 | Disposition: A | Discharge status: Home / Self Care | Payer: Medicare HMO | Source: Ambulatory Visit | Attending: Cardiology | Admitting: Cardiology

## 2015-10-12 ENCOUNTER — Encounter (HOSPITAL_COMMUNITY)
Admission: RE | Disposition: A | Discharge status: Home / Self Care | Payer: Self-pay | Source: Ambulatory Visit | Attending: Cardiology

## 2015-10-12 DIAGNOSIS — Z7901 Long term (current) use of anticoagulants: Secondary | ICD-10-CM | POA: Diagnosis not present

## 2015-10-12 DIAGNOSIS — I480 Paroxysmal atrial fibrillation: Secondary | ICD-10-CM

## 2015-10-12 DIAGNOSIS — F329 Major depressive disorder, single episode, unspecified: Secondary | ICD-10-CM | POA: Insufficient documentation

## 2015-10-12 DIAGNOSIS — Z7982 Long term (current) use of aspirin: Secondary | ICD-10-CM | POA: Insufficient documentation

## 2015-10-12 DIAGNOSIS — Z79899 Other long term (current) drug therapy: Secondary | ICD-10-CM | POA: Insufficient documentation

## 2015-10-12 DIAGNOSIS — I4891 Unspecified atrial fibrillation: Secondary | ICD-10-CM

## 2015-10-12 DIAGNOSIS — F419 Anxiety disorder, unspecified: Secondary | ICD-10-CM | POA: Insufficient documentation

## 2015-10-12 HISTORY — DX: Unspecified atrial fibrillation: I48.91

## 2015-10-12 HISTORY — PX: ELECTROPHYSIOLOGIC STUDY: SHX172A

## 2015-10-12 LAB — POCT ACTIVATED CLOTTING TIME
ACTIVATED CLOTTING TIME: 131 s
Activated Clotting Time: 137 seconds

## 2015-10-12 SURGERY — ATRIAL FIBRILLATION ABLATION
Anesthesia: General

## 2015-10-12 MED ORDER — PROTAMINE SULFATE 10 MG/ML IV SOLN
INTRAVENOUS | Status: DC | PRN
  Administered 2015-10-12 (×3): 10 mg via INTRAVENOUS

## 2015-10-12 MED ORDER — APIXABAN 5 MG PO TABS
5.0000 mg | ORAL_TABLET | Freq: Two times a day (BID) | ORAL | Status: DC
  Administered 2015-10-12 – 2015-10-13 (×2): 5 mg via ORAL
  Filled 2015-10-12 (×2): qty 1

## 2015-10-12 MED ORDER — ISOPROTERENOL HCL 0.2 MG/ML IJ SOLN
INTRAMUSCULAR | Status: AC
  Filled 2015-10-12: qty 5

## 2015-10-12 MED ORDER — ADENOSINE 6 MG/2ML IV SOLN
INTRAVENOUS | Status: AC
  Filled 2015-10-12: qty 4

## 2015-10-12 MED ORDER — ADENOSINE 6 MG/2ML IV SOLN
INTRAVENOUS | Status: AC
  Filled 2015-10-12: qty 2

## 2015-10-12 MED ORDER — MIDAZOLAM HCL 5 MG/5ML IJ SOLN
INTRAMUSCULAR | Status: DC | PRN
  Administered 2015-10-12: 2 mg via INTRAVENOUS

## 2015-10-12 MED ORDER — TRAZODONE HCL 50 MG PO TABS
100.0000 mg | ORAL_TABLET | Freq: Every day | ORAL | Status: DC
  Administered 2015-10-13: 100 mg via ORAL
  Filled 2015-10-12: qty 2

## 2015-10-12 MED ORDER — SODIUM CHLORIDE 0.9% FLUSH
3.0000 mL | Freq: Two times a day (BID) | INTRAVENOUS | Status: DC
  Administered 2015-10-13 (×2): 3 mL via INTRAVENOUS

## 2015-10-12 MED ORDER — SODIUM CHLORIDE 0.9% FLUSH: 3.0000 mL | INTRAVENOUS | Status: DC | PRN

## 2015-10-12 MED ORDER — ADENOSINE 6 MG/2ML IV SOLN
INTRAVENOUS | Status: DC | PRN
  Administered 2015-10-12 (×4): 12 mg via INTRAVENOUS

## 2015-10-12 MED ORDER — HEPARIN SODIUM (PORCINE) 1000 UNIT/ML IJ SOLN
INTRAMUSCULAR | Status: AC
  Filled 2015-10-12: qty 1

## 2015-10-12 MED ORDER — ACETAMINOPHEN 325 MG PO TABS
650.0000 mg | ORAL_TABLET | ORAL | Status: DC | PRN
  Administered 2015-10-12 – 2015-10-13 (×2): 650 mg via ORAL
  Filled 2015-10-12 (×2): qty 2

## 2015-10-12 MED ORDER — SODIUM CHLORIDE 0.9 % IV SOLN
INTRAVENOUS | Status: DC
  Administered 2015-10-12 (×2): via INTRAVENOUS

## 2015-10-12 MED ORDER — ONDANSETRON HCL 4 MG/2ML IJ SOLN
INTRAMUSCULAR | Status: DC | PRN
  Administered 2015-10-12: 4 mg via INTRAVENOUS

## 2015-10-12 MED ORDER — BUPIVACAINE HCL (PF) 0.25 % IJ SOLN
INTRAMUSCULAR | Status: AC
  Filled 2015-10-12: qty 30

## 2015-10-12 MED ORDER — PROPOFOL 10 MG/ML IV BOLUS
INTRAVENOUS | Status: DC | PRN
  Administered 2015-10-12: 140 mg via INTRAVENOUS

## 2015-10-12 MED ORDER — QUETIAPINE FUMARATE 100 MG PO TABS
100.0000 mg | ORAL_TABLET | Freq: Every day | ORAL | Status: DC
  Administered 2015-10-13: 100 mg via ORAL
  Filled 2015-10-12: qty 1

## 2015-10-12 MED ORDER — GABAPENTIN 300 MG PO CAPS
300.0000 mg | ORAL_CAPSULE | Freq: Four times a day (QID) | ORAL | Status: DC
  Administered 2015-10-12 – 2015-10-13 (×2): 300 mg via ORAL
  Filled 2015-10-12 (×2): qty 1

## 2015-10-12 MED ORDER — ONDANSETRON HCL 4 MG/2ML IJ SOLN: 4.0000 mg | Freq: Four times a day (QID) | INTRAMUSCULAR | Status: DC | PRN

## 2015-10-12 MED ORDER — HEPARIN (PORCINE) IN NACL 2-0.9 UNIT/ML-% IJ SOLN
INTRAMUSCULAR | Status: AC
  Filled 2015-10-12: qty 500

## 2015-10-12 MED ORDER — VENLAFAXINE HCL ER 75 MG PO CP24
75.0000 mg | ORAL_CAPSULE | Freq: Three times a day (TID) | ORAL | Status: DC
  Administered 2015-10-13 (×2): 75 mg via ORAL
  Filled 2015-10-12 (×4): qty 1

## 2015-10-12 MED ORDER — ISOPROTERENOL HCL 0.2 MG/ML IJ SOLN
1000.0000 ug | INTRAVENOUS | Status: DC | PRN
  Administered 2015-10-12: 2 ug/min via INTRAVENOUS

## 2015-10-12 MED ORDER — BUPIVACAINE HCL (PF) 0.25 % IJ SOLN
INTRAMUSCULAR | Status: DC | PRN
  Administered 2015-10-12: 30 mL

## 2015-10-12 MED ORDER — HEPARIN SODIUM (PORCINE) 1000 UNIT/ML IJ SOLN
INTRAMUSCULAR | Status: DC | PRN
  Administered 2015-10-12: 11000 [IU] via INTRAVENOUS
  Administered 2015-10-12: 4000 [IU] via INTRAVENOUS
  Administered 2015-10-12 (×2): 1000 [IU] via INTRAVENOUS

## 2015-10-12 MED ORDER — HEPARIN (PORCINE) IN NACL 2-0.9 UNIT/ML-% IJ SOLN
INTRAMUSCULAR | Status: DC | PRN
  Administered 2015-10-12: 12:00:00

## 2015-10-12 MED ORDER — ALPRAZOLAM 0.5 MG PO TABS
0.5000 mg | ORAL_TABLET | Freq: Three times a day (TID) | ORAL | Status: DC | PRN
  Administered 2015-10-13: 0.5 mg via ORAL
  Filled 2015-10-12: qty 1

## 2015-10-12 MED ORDER — PHENYLEPHRINE HCL 10 MG/ML IJ SOLN
10.0000 mg | INTRAVENOUS | Status: DC | PRN
  Administered 2015-10-12: 20 ug/min via INTRAVENOUS

## 2015-10-12 MED ORDER — LIDOCAINE HCL (CARDIAC) 20 MG/ML IV SOLN
INTRAVENOUS | Status: DC | PRN
  Administered 2015-10-12: 60 mg via INTRAVENOUS

## 2015-10-12 MED ORDER — FENTANYL CITRATE (PF) 250 MCG/5ML IJ SOLN
INTRAMUSCULAR | Status: DC | PRN
  Administered 2015-10-12: 50 ug via INTRAVENOUS

## 2015-10-12 MED ORDER — SUCCINYLCHOLINE CHLORIDE 20 MG/ML IJ SOLN
INTRAMUSCULAR | Status: DC | PRN
  Administered 2015-10-12: 100 mg via INTRAVENOUS

## 2015-10-12 MED ORDER — HEPARIN SODIUM (PORCINE) 1000 UNIT/ML IJ SOLN
INTRAMUSCULAR | Status: DC | PRN
  Administered 2015-10-12: 1000 [IU] via INTRAVENOUS

## 2015-10-12 MED ORDER — SODIUM CHLORIDE 0.9 % IV SOLN: 250.0000 mL | INTRAVENOUS | Status: DC | PRN

## 2015-10-12 MED ORDER — PHENYLEPHRINE HCL 10 MG/ML IJ SOLN
INTRAMUSCULAR | Status: DC | PRN
  Administered 2015-10-12 (×2): 80 ug via INTRAVENOUS

## 2015-10-12 MED ORDER — RIVAROXABAN 20 MG PO TABS: 20.0000 mg | ORAL_TABLET | Freq: Every day | ORAL | Status: DC

## 2015-10-12 SURGICAL SUPPLY — 22 items
BAG SNAP BAND KOVER 36X36 (MISCELLANEOUS) ×3 IMPLANT
BLANKET WARM UNDERBOD FULL ACC (MISCELLANEOUS) ×3 IMPLANT
CATH JOSEPH QUAD ALLRED 6F REP (CATHETERS) ×4 IMPLANT
CATH NAVISTAR SMARTTOUCH DF (ABLATOR) ×2 IMPLANT
CATH SOUNDSTAR 3D IMAGING (CATHETERS) ×2 IMPLANT
CATH VARIABLE LASSO NAV 2515 (CATHETERS) ×3 IMPLANT
CATH WEBSTER BI DIR CS D-F CRV (CATHETERS) ×3 IMPLANT
COVER SWIFTLINK CONNECTOR (BAG) ×3 IMPLANT
NDL TRANSSEPTAL BRK 98CM (NEEDLE) IMPLANT
NEEDLE TRANSSEPTAL BRK 98CM (NEEDLE) ×3 IMPLANT
PACK EP LATEX FREE (CUSTOM PROCEDURE TRAY) ×3
PACK EP LF (CUSTOM PROCEDURE TRAY) ×1 IMPLANT
PAD DEFIB LIFELINK (PAD) ×3 IMPLANT
PATCH CARTO3 (PAD) ×2 IMPLANT
SHEATH AGILIS NXT 8.5F 71CM (SHEATH) ×4 IMPLANT
SHEATH AVANTI 11F 11CM (SHEATH) ×3 IMPLANT
SHEATH PINNACLE 7F 10CM (SHEATH) ×3 IMPLANT
SHEATH PINNACLE 8F 10CM (SHEATH) ×4 IMPLANT
SHEATH PINNACLE 9F 10CM (SHEATH) ×6 IMPLANT
SHIELD RADPAD SCOOP 12X17 (MISCELLANEOUS) ×3 IMPLANT
TRANSDUCER W/STOPCOCK (MISCELLANEOUS) ×4 IMPLANT
TUBING SMART ABLATE COOLFLOW (TUBING) ×2 IMPLANT

## 2015-10-12 NOTE — H&P (Signed)
Electrophysiology Office Note   Date: 08/28/2015   ID: Sheri [name], DOB 08-24-1950, MRN [id number]  PCP: Pcp Not In System Cardiologist: [place] Primary Electrophysiologist: Jetson Pickrel [name], MD   Chief Complaint  Patient presents with  . Follow-up    History of Present Illness: Sheri [name] is a 65 y.o. female who presents today for electrophysiology evaluation. She has a history of atrial fibrillation.   She admitted in 02/2013 for A fib with RVR she was placed on procainamide drip. She then converted to SR. She had echo done which was generally normal. She does report 2 of her sisters with the same disorder - they have both undergone a-fib ablations which have successfully terminated their arrhythmias. She is treated for anxiety and panic attacks with seroquel, trazadone, effexor and xanax.  She is followed by Dr. Debara Pickett and last seen on 06/12/14 with worsening of her palpitations. She started taking propranolol daily. He also recommended starting low dose ASA.  Today, she denies symptoms of palpitations, chest pain, shortness of breath, orthopnea, PND, lower extremity edema, claudication, dizziness, presyncope, syncope, bleeding, or neurologic sequela. The patient is tolerating medications without difficulties and is otherwise without complaint today. She gets her palpitations at random times during the day. The occur at times monthly but also occur more often than that. She has had to go to the hospital at times for this.   Past Medical History  Diagnosis Date  . Anxiety   . Depression     treated at [facility]  . Poisoning by selective serotonin reuptake inhibitors(969.03) Jan 2007    severe reaction with MAOI (nardil) and dextromethorphan  . Goiter     hx of goiter  . Allergic rhinitis   . Chronic sinusitis   . DEGENERATIVE JOINT DISEASE, HANDS     several images revealing OA  . Palpitations       Associated with difficulty breathing  . Tachycardia   . Panic attacks   . Atrial fibrillation with RVR ([place])   . Hx of atrial tachycardia since age 26 03/15/2013   Past Surgical History  Procedure Laterality Date  . Right mastoidectomy      ET tube placement in 1980 and 1990  . Thyroidectomy, partial      nodule removal - April 1996  . Right sinus surgery  october 2000  . Ovarian cyst removal  march 1969    left ovarian cystectomy  . Appendectomy       Current Outpatient Prescriptions  Medication Sig Dispense Refill  . ALPRAZolam (XANAX) 0.5 MG tablet Take 0.5 mg by mouth 3 (three) times daily as needed for anxiety.     Marland Kitchen aspirin EC 81 MG tablet Take 81 mg by mouth daily.    Marland Kitchen gabapentin (NEURONTIN) 300 MG capsule Take 300 mg by mouth 3 (three) times daily.     . propranolol (INDERAL) 10 MG tablet Take 1 tablet (10 mg total) by mouth 3 (three) times daily as needed (palpitations). 30 tablet 1  . QUEtiapine (SEROQUEL) 25 MG tablet Take 12.5 mg by mouth at bedtime.     . traZODone (DESYREL) 100 MG tablet Take 100 mg by mouth at bedtime.    Marland Kitchen venlafaxine XR (EFFEXOR-XR) 75 MG 24 hr capsule Take 75 mg by mouth 2 (two) times daily. Patient takes one tablet in the morning and one at lunch time.     No current facility-administered medications for this visit.    Allergies: Guaifenesin  Social History: The patient  reports that she has never smoked. She has never used smokeless tobacco. She reports that she does not drink alcohol or use illicit drugs.   Family History: The patient's family history includes Atrial fibrillation in her sister and sister; Hypertension in her father and mother; Psoriasis in her father.    ROS: Please see the history of present illness. Otherwise, review of systems is positive for palpitations, DOE, anxiety. All other systems are reviewed and negative.     PHYSICAL EXAM: Filed Vitals:   10/12/15 0927  BP: 111/75  Pulse: 77  Temp: 97.6 F (36.4 C)  Resp: 18    GEN: Well nourished, well developed, in no acute distress  HEENT: normal  Neck: no JVD, carotid bruits, or masses Cardiac: RRR; no murmurs, rubs, or gallops,no edema  Respiratory: clear to auscultation bilaterally, normal work of breathing GI: soft, nontender, nondistended, + BS MS: no deformity or atrophy  Skin: warm and dry Neuro: Strength and sensation are intact Psych: euthymic mood, full affect  Other studies Reviewed: Additional studies/ records that were reviewed today include: TTE 2014  Review of the above records today demonstrates:  - Left ventricle: The cavity size was normal. Wall thickness was increased in a pattern of mild LVH. Systolic function was normal. The estimated ejection fraction was in the range of 55% to 60%. Wall motion was normal; there were no regional wall motion abnormalities. Left ventricular diastolic function parameters were normal. - Left atrium: The atrium was normal in size. - Tricuspid valve: Poorly visualized. Trivial regurgitation. - Pulmonary arteries: PA peak pressure: 69mm Hg (S). - Inferior vena cava: The vessel was normal in size; the respirophasic diameter changes were in the normal range  50%); findings are consistent with normal central venous pressure.   ASSESSMENT AND PLAN:  1. Paroxysmal atrial fibrillation: Presenting today for ablaiton.  Discussed risks including bleeding, tamponade, heart block, stroke, and damage to the esophagus.  She understands these risks and has agreed to the procedure.    This patients CHA2DS2-VASc Score and unadjusted Ischemic Stroke Rate (% per year) is equal to 2.2 % stroke rate/year from a score of 2  Above score calculated as 1 point each if present [CHF, HTN, DM, Vascular=MI/PAD/Aortic Plaque, Age if 65-74, or Female] Above score calculated as 2 points each  if present [Age > 75, or Stroke/TIA/TE]    Signed, Jasmain Ahlberg [name], MD  08/28/2015 9:48 AM

## 2015-10-12 NOTE — Anesthesia Postprocedure Evaluation (Signed)
Anesthesia Post Note  Patient: Chauncey Cruel Capshaw  Procedure(s) Performed: Procedure(s) (LRB): Atrial Fibrillation Ablation (N/A)  Patient location during evaluation: Cath Lab Anesthesia Type: General Level of consciousness: awake, awake and alert and oriented Pain management: pain level controlled Vital Signs Assessment: post-procedure vital signs reviewed and stable Respiratory status: spontaneous breathing and nonlabored ventilation Cardiovascular status: blood pressure returned to baseline Anesthetic complications: no    Last Vitals:  Filed Vitals:   10/12/15 0927  BP: 111/75  Pulse: 77  Temp: 36.4 C  Resp: 18    Last Pain: There were no vitals filed for this visit.               Brodie Correll COKER

## 2015-10-12 NOTE — Discharge Summary (Signed)
ELECTROPHYSIOLOGY PROCEDURE DISCHARGE SUMMARY    Patient ID: Sheri [name],  MRN: [id number], DOB/AGE: 27-Apr-1951 65 y.o.  Admit date: 10/12/2015 Discharge date: 10/13/15  Primary Care Physician: Julious Oka, MD Primary Cardiologist: Dr. Debara Pickett Electrophysiologist: Dr. [name]  Primary Discharge Diagnosis:  1. Paroxysmal AFib     CHA2DS2Vasc is 2      Xarelto to continue post-procedure  Secondary Discharge Diagnosis:  1. Depression/Anxiety  Procedures This Admission:  1.  Electrophysiology study and radiofrequency catheter ablation on 10/12/15 by Dr [name].  This study demonstrated  CONCLUSIONS: 1. Sinus rhythm upon presentation.  2. Successful electrical isolation and anatomical encircling of all four pulmonary veins with radiofrequency current. 3. No inducible arrhythmias following ablation both on and off of Isuprel 4. No early apparent complications.  Brief HPI: Sheri [name] is a 65 y.o. female with a history of paroxysmal atrial fibrillation.  They have failed beta blocker therapy and unable to use most AAD secondary to medication interactions with her antidepressive medications. Risks, benefits, and alternatives to catheter ablation of atrial fibrillation were reviewed with the patient who wished to proceed.  The patient underwent TEE prior to the procedure which demonstrated normal LV function and no LAA thrombus.    Hospital Course:  The patient was admitted and underwent EPS/RFCA of atrial fibrillation with details as outlined above.  AF ablation done without complication. Had 1:1 tachycardia at beginning of the case which was unable to be induced and tested at the end and therefore no ablation of second focus. The patient  monitored on telemetry overnight which demonstrated sinus rhythm.  Groin was stable without complication on the day of discharge.  The patient was examined by Dr. [name], MD and considered to be stable for discharge. Wound care and  restrictions were reviewed with the patient.  The patient will be seen back by Roderic Palau, NP in 4 weeks and Dr [name] in 12 weeks for post ablation follow up.  Switched from Xarelto to [employer]     Physical Exam: Filed Vitals:   10/13/15 0000 10/13/15 0339 10/13/15 0400 10/13/15 0640  BP: 85/51 83/54 80/53  94/53  Pulse: 81 86 83 91  Temp:  97.9 F (36.6 C)    TempSrc:  Oral    Resp: 19 19 20 19   Height:      Weight:      SpO2: 98% 93% 93% 95%    Labs:   Lab Results  Component Value Date   WBC 7.6 10/09/2015   HGB 13.2 10/09/2015   HCT 39.4 10/09/2015   MCV 90.6 10/09/2015   PLT 327 10/09/2015     Recent Labs Lab 10/09/15 1107  NA 141  K 4.1  CL 107  CO2 27  BUN 9  CREATININE 0.66  CALCIUM 8.5*  GLUCOSE 62*     Discharge Medications:    Medication List    TAKE these medications        ALPRAZolam 0.5 MG tablet  Commonly known as:  XANAX  Take 0.5 mg by mouth 3 (three) times daily as needed for anxiety.     apixaban 5 MG Tabs tablet  Commonly known as:  ELIQUIS  Take 1 tablet (5 mg total) by mouth 2 (two) times daily.     gabapentin 300 MG capsule  Commonly known as:  NEURONTIN  Take 300 mg by mouth 4 (four) times daily.     propranolol 10 MG tablet  Commonly known as:  INDERAL  Take  1 tablet (10 mg total) by mouth 3 (three) times daily.     QUEtiapine 200 MG tablet  Commonly known as:  SEROQUEL  Take 100 mg by mouth at bedtime.     traZODone 100 MG tablet  Commonly known as:  DESYREL  Take 100 mg by mouth at bedtime.     venlafaxine XR 75 MG 24 hr capsule  Commonly known as:  EFFEXOR-XR  Take 75 mg by mouth 3 (three) times daily.        Disposition: Home Discharge Instructions    Diet - low sodium heart healthy    Complete by:  As directed      Discharge instructions    Complete by:  As directed   No driving for 4 days. No lifting over 5 lbs for 1 week. No vigorous or sexual activity for 1 week. You may return to work on 10/20/15.  Keep procedure site clean & dry. If you notice increased pain, swelling, bleeding or pus, call/return!  You may shower, but no soaking baths/hot tubs/pools for 1 week.     Increase activity slowly    Complete by:  As directed           Follow-up Information    Follow up with [facility] On 11/13/2015.   Specialty:  Cardiology   Why:  3:00PM   Contact information:   [id number] [place] [phone]      Follow up with [name], MD On 01/18/2016.   Specialty:  Cardiology   Why:  10:30AM   Contact information:   [place] [address] [phone]       Duration of Discharge Encounter: Greater than 30 minutes including physician time.  Signed, Bhagat,Bhavinkumar, PAC   I have seen and examined this patient with Leanor Kail on 10/12/15.  Agree with above, note added to reflect my findings.  On exam, regular rhythm, no murmurs, lungs clear.  Had AF ablation done with successful PVI.  Had narrow complex tachycardia at the beginning of the case but not able to induce it after.  Will plan for discharge on eliquis and follow up in clinic in 3 months.  Will M. Camnitz MD 10/16/2015 8:43 AM

## 2015-10-12 NOTE — Progress Notes (Signed)
Called by nurse because patient refused Xarelto, which she believes is contraindicated with her other meds. She has not been taking this medication for the last month. She claims that after Dr. [name] prescribed it, someone else called her and told her not to take it since it was contraindicated. I suspect that there may have been confusion with another medication name. There is no note from our office, but there is a note from another provider that recommended stopping Seroquel. The names do sound similar. Explained the mandatory need to take anticoagulation to avoid embolic stroke. She remained skeptical. Will use Eliquis instead.

## 2015-10-12 NOTE — Transfer of Care (Signed)
Immediate Anesthesia Transfer of Care Note  Patient: Sheri [name]  Procedure(s) Performed: Procedure(s): Atrial Fibrillation Ablation (N/A)  Patient Location: Cath Lab  Anesthesia Type:General  Level of Consciousness: awake, alert  and oriented  Airway & Oxygen Therapy: Patient Spontanous Breathing  Post-op Assessment: Report given to RN  Post vital signs: Reviewed and stable  Last Vitals:  Filed Vitals:   10/12/15 0927  BP: 111/75  Pulse: 77  Temp: 36.4 C  Resp: 18    Complications: No apparent anesthesia complications

## 2015-10-12 NOTE — Anesthesia Procedure Notes (Signed)
Procedure Name: Intubation Date/Time: 10/12/2015 11:54 AM Performed by: Myna Bright Pre-anesthesia Checklist: Patient identified, Emergency Drugs available, Suction available and Patient being monitored Patient Re-evaluated:Patient Re-evaluated prior to inductionOxygen Delivery Method: Circle system utilized Preoxygenation: Pre-oxygenation with 100% oxygen Intubation Type: IV induction Ventilation: Mask ventilation without difficulty Laryngoscope Size: Mac and 3 Grade View: Grade II Tube type: Oral Tube size: 7.0 mm Number of attempts: 1 Airway Equipment and Method: Stylet Placement Confirmation: ETT inserted through vocal cords under direct vision,  positive ETCO2 and breath sounds checked- equal and bilateral Secured at: 21 cm Tube secured with: Tape Dental Injury: Teeth and Oropharynx as per pre-operative assessment

## 2015-10-12 NOTE — Anesthesia Preprocedure Evaluation (Addendum)
Anesthesia Evaluation  Patient identified by MRN, date of birth, ID band Patient awake    Reviewed: Allergy & Precautions, NPO status , Patient's Chart, lab work & pertinent test results  Airway Mallampati: II  TM Distance: >3 FB Neck ROM: Full    Dental  (+) Missing, Dental Advisory Given   Pulmonary neg pulmonary ROS,    breath sounds clear to auscultation       Cardiovascular + dysrhythmias Atrial Fibrillation  Rhythm:Regular Rate:Normal     Neuro/Psych Anxiety Depression negative neurological ROS     GI/Hepatic Neg liver ROS, GERD  ,  Endo/Other  negative endocrine ROS  Renal/GU negative Renal ROS     Musculoskeletal  (+) Arthritis ,   Abdominal   Peds  Hematology negative hematology ROS (+)   Anesthesia Other Findings   Reproductive/Obstetrics                            Lab Results  Component Value Date   WBC 7.6 10/09/2015   HGB 13.2 10/09/2015   HCT 39.4 10/09/2015   MCV 90.6 10/09/2015   PLT 327 10/09/2015   Lab Results  Component Value Date   CREATININE 0.66 10/09/2015   BUN 9 10/09/2015   NA 141 10/09/2015   K 4.1 10/09/2015   CL 107 10/09/2015   CO2 27 10/09/2015    Anesthesia Physical Anesthesia Plan  ASA: II  Anesthesia Plan: General   Post-op Pain Management:    Induction: Intravenous  Airway Management Planned: Oral ETT  Additional Equipment:   Intra-op Plan:   Post-operative Plan: Extubation in OR  Informed Consent: I have reviewed the patients History and Physical, chart, labs and discussed the procedure including the risks, benefits and alternatives for the proposed anesthesia with the patient or authorized representative who has indicated his/her understanding and acceptance.   Dental advisory given  Plan Discussed with: CRNA  Anesthesia Plan Comments:         Anesthesia Quick Evaluation

## 2015-10-12 NOTE — Progress Notes (Signed)
Site RFV x2/LFV x 2 Site Prior to Removal:  Level 0/0 Pressure Applied For:10min/20min Manual:  yes  Patient Status During Pull: stable  Post Pull Site:  Level 0/0 Post Pull Instructions Given:  yes Post Pull Pulses Present: palpable Dressing Applied:  tegaderm Bedrest begins @ [id number] till 2330 Comments:by Debbie Young and canderson

## 2015-10-12 NOTE — Progress Notes (Signed)
ANTICOAGULATION CONSULT NOTE - Initial Consult  Pharmacy Consult for Eliquis Indication: atrial fibrillation  Allergies  Allergen Reactions  . Guaifenesin Palpitations    Patient Measurements: Height: 5' 5.5" (166.4 cm) Weight: 150 lb (68.04 kg) IBW/kg (Calculated) : 58.15  Vital Signs: Temp: 97.8 F (36.6 C) (03/24 2001) Temp Source: Oral (03/24 2001) BP: 107/75 mmHg (03/24 2001) Pulse Rate: 94 (03/24 2001)  Labs: No results for input(s): HGB, HCT, PLT, APTT, LABPROT, INR, HEPARINUNFRC, HEPRLOWMOCWT, CREATININE, CKTOTAL, CKMB, TROPONINI in the last 72 hours.  Estimated Creatinine Clearance: 64.4 mL/min (by C-G formula based on Cr of 0.66).   Medical History: Past Medical History  Diagnosis Date  . Anxiety   . Depression     treated at [facility]  . Poisoning by selective serotonin reuptake inhibitors(969.03) Jan 2007    severe reaction with MAOI (nardil) and dextromethorphan  . Goiter     hx of goiter  . Allergic rhinitis   . Chronic sinusitis   . DEGENERATIVE JOINT DISEASE, HANDS     several images revealing OA  . Palpitations     Associated with difficulty breathing  . Tachycardia   . Panic attacks   . Atrial fibrillation with RVR ([place])   . Hx of atrial tachycardia since age 59 03/15/2013    Assessment: 65 YOF with Afib, s/p ablation today. MD started xarelto, but patient refused it. Will switch to eliquis instead. CBC wnl, scr 0.66 on 3/21. No significant drug interaction with her current or home meds.  Goal of Therapy:   Monitor platelets by anticoagulation protocol: Yes   Plan:  - Eliquis 5mg  po BID, first dose now.  Maryanna Shape, PharmD, BCPS  Clinical Pharmacist  Pager: [phone]   10/12/2015,8:04 PM

## 2015-10-13 DIAGNOSIS — F329 Major depressive disorder, single episode, unspecified: Secondary | ICD-10-CM | POA: Diagnosis not present

## 2015-10-13 DIAGNOSIS — Z7982 Long term (current) use of aspirin: Secondary | ICD-10-CM | POA: Diagnosis not present

## 2015-10-13 DIAGNOSIS — F419 Anxiety disorder, unspecified: Secondary | ICD-10-CM | POA: Diagnosis not present

## 2015-10-13 DIAGNOSIS — I480 Paroxysmal atrial fibrillation: Secondary | ICD-10-CM | POA: Diagnosis not present

## 2015-10-13 MED ORDER — APIXABAN 5 MG PO TABS: 5.0000 mg | ORAL_TABLET | Freq: Two times a day (BID) | ORAL | Status: DC

## 2015-10-13 NOTE — Discharge Instructions (Signed)
No driving for 4 days. No lifting over 5 lbs for 1 week. No vigorous or sexual activity for 1 week. You may return to work on 10/20/15. Keep procedure site clean & dry. If you notice increased pain, swelling, bleeding or pus, call/return!  You may shower, but no soaking baths/hot tubs/pools for 1 week.     You have an appointment set up with the [facility].  Multiple studies have shown that being followed by a dedicated atrial fibrillation clinic in addition to the standard care you receive from your other physicians improves health. We believe that enrollment in the atrial fibrillation clinic will allow [place] to better care for you.   The phone number to the [facility] is [phone]. The clinic is staffed Monday through Friday from 8:30am to 5pm.  Parking Directions: The clinic is located in the Heart and Vascular Building connected to [facility]. 1)From [address] turn on to [address] and go to the 3rd entrance  (Heart and Vascular entrance) on the right. 2)Look to the right for Heart &Vascular Parking Garage. 3)A code for the entrance is required please call the clinic to receive this.   4)Take the elevators to the 1st floor. Registration is in the room with the glass walls at the end of the hallway.  If you have any trouble parking or locating the clinic, please dont hesitate to call [phone].  Information on my medicine - ELIQUIS (apixaban)  This medication education was reviewed with me or my healthcare representative as part of my discharge preparation.  The pharmacist that spoke with me during my hospital stay was:  Wynelle Fanny, [facility]  Why was Eliquis prescribed for you? Eliquis was prescribed for you to reduce the risk of a blood clot forming that can cause a stroke if you have a medical condition called atrial fibrillation (a type of irregular heartbeat).  What do You need to know about Eliquis ? Take your Eliquis TWICE  DAILY - one tablet in the morning and one tablet in the evening with or without food. If you have difficulty swallowing the tablet whole please discuss with your pharmacist how to take the medication safely.  Take Eliquis exactly as prescribed by your doctor and DO NOT stop taking Eliquis without talking to the doctor who prescribed the medication.  Stopping may increase your risk of developing a stroke.  Refill your prescription before you run out.  After discharge, you should have regular check-up appointments with your healthcare provider that is prescribing your Eliquis.  In the future your dose may need to be changed if your kidney function or weight changes by a significant amount or as you get older.  What do you do if you miss a dose? If you miss a dose, take it as soon as you remember on the same day and resume taking twice daily.  Do not take more than one dose of ELIQUIS at the same time to make up a missed dose.  Important Safety Information A possible side effect of Eliquis is bleeding. You should call your healthcare provider right away if you experience any of the following: ? Bleeding from an injury or your nose that does not stop. ? Unusual colored urine (red or dark brown) or unusual colored stools (red or black). ? Unusual bruising for unknown reasons. ? A serious fall or if you hit your head (even if there is no bleeding).  Some medicines may interact with Eliquis and might  increase your risk of bleeding or clotting while on Eliquis. To help avoid this, consult your healthcare provider or pharmacist prior to using any new prescription or non-prescription medications, including herbals, vitamins, non-steroidal anti-inflammatory drugs (NSAIDs) and supplements.  This website has more information on Eliquis (apixaban): http://www.eliquis.com/eliquis/home

## 2015-10-13 NOTE — Progress Notes (Signed)
Discharge education given and patient verbalized understanding 

## 2015-10-13 NOTE — Progress Notes (Signed)
SUBJECTIVE: The patient is doing well today.  At this time, she denies chest pain, shortness of breath, or any new concerns.  AF ablation yesterday without complication.  Had 1:1 tachycardia at beginning of the case which was unable to be induced and tested at the end and therefore no ablation of second focus.  Marland Kitchen apixaban  5 mg Oral BID  . gabapentin  300 mg Oral QID  . QUEtiapine  100 mg Oral QHS  . sodium chloride flush  3 mL Intravenous Q12H  . traZODone  100 mg Oral QHS  . venlafaxine XR  75 mg Oral TID      OBJECTIVE: Physical Exam: Filed Vitals:   10/13/15 0000 10/13/15 0339 10/13/15 0400 10/13/15 0640  BP: 85/51 83/54 80/53  94/53  Pulse: 81 86 83 91  Temp:  97.9 F (36.6 C)    TempSrc:  Oral    Resp: 19 19 20 19   Height:      Weight:      SpO2: 98% 93% 93% 95%    Intake/Output Summary (Last 24 hours) at 10/13/15 0819 Last data filed at 10/12/15 2200  Gross per 24 hour  Intake   1000 ml  Output    475 ml  Net    525 ml    Telemetry reveals sinus rhythm  GEN- The patient is well appearing, alert and oriented x 3 today.   Head- normocephalic, atraumatic Eyes-  Sclera clear, conjunctiva pink Ears- hearing intact Oropharynx- clear Neck- supple, no JVP Lymph- no cervical lymphadenopathy Lungs- Clear to ausculation bilaterally, normal work of breathing Heart- Regular rate and rhythm, no murmurs, rubs or gallops, PMI not laterally displaced GI- soft, NT, ND, + BS Extremities- no clubbing, cyanosis, or edema Skin- no rash or lesion Psych- euthymic mood, full affect Neuro- strength and sensation are intact  LABS: Basic Metabolic Panel: No results for input(s): NA, K, CL, CO2, GLUCOSE, BUN, CREATININE, CALCIUM, MG, PHOS in the last 72 hours. Liver Function Tests: No results for input(s): AST, ALT, ALKPHOS, BILITOT, PROT, ALBUMIN in the last 72 hours. No results for input(s): LIPASE, AMYLASE in the last 72 hours. CBC: No results for input(s): WBC, NEUTROABS,  HGB, HCT, MCV, PLT in the last 72 hours. Cardiac Enzymes: No results for input(s): CKTOTAL, CKMB, CKMBINDEX, TROPONINI in the last 72 hours. BNP: Invalid input(s): POCBNP D-Dimer: No results for input(s): DDIMER in the last 72 hours. Hemoglobin A1C: No results for input(s): HGBA1C in the last 72 hours. Fasting Lipid Panel: No results for input(s): CHOL, HDL, LDLCALC, TRIG, CHOLHDL, LDLDIRECT in the last 72 hours. Thyroid Function Tests: No results for input(s): TSH, T4TOTAL, T3FREE, THYROIDAB in the last 72 hours.  Invalid input(s): FREET3 Anemia Panel: No results for input(s): VITAMINB12, FOLATE, FERRITIN, TIBC, IRON, RETICCTPCT in the last 72 hours.  RADIOLOGY: Ct Heart Morp W/cta Cor W/score W/ca W/cm &/or Wo/cm  10/09/2015  ADDENDUM REPORT: 10/09/2015 16:43 CLINICAL DATA:  Chest pain EXAM: Cardiac CTA MEDICATIONS: Sub lingual nitro. 4mg  and lopressor 7.5mg  IV TECHNIQUE: The patient was scanned on a Philips [id number] slice scanner. Gantry rotation speed was 270 msecs. Collimation was .71mm. A 100 kV prospective scan was triggered in the descending thoracic aorta at 111 HU's with 5% padding centered around 78% of the R-R interval. Average HR during the scan was 65 bpm. The 3D data set was interpreted on a dedicated work station using MPR, MIP and VRT modes. A total of 80cc of contrast was used. FINDINGS: Non-cardiac: See separate  report from [facility]. Calcium Score:  17 Agatston units. Coronary Arteries: Right dominant with no anomalies LM:  No plaque or stenosis. LAD system: No plaque or stenosis in the LAD itself. There was a moderate 1st diagonal with a small area of calcified plaque at the ostium, no significant stenosis. Circumflex system: There was a small ramus and large 1st obtuse marginal. No plaque or stenosis. RCA:  No plaque or stenosis. The pulmonary veins drained normally to the left atrium. There did not appear to be a left atrial appendage thrombus. Left upper PV:  18 x 13  mm Left lower PV:  13 x 13 mm Right upper PV:  17 x 12 mm Right lower PV:  16 x 13 mm Mild ascending aorta dilation, see radiology report. l IMPRESSION: 1.  No obstructive coronary disease noted. 2. Coronary artery calcium score 17 Agatston units. This places the patient in the 54th percentile for age and gender. This suggests intermediate risk for future cardiac event. 3.  Pulmonary veins as noted above. Dalton Mclean Electronically Signed   By: Loralie Champagne M.D.   On: 10/09/2015 16:43  10/09/2015  EXAM: OVER-READ INTERPRETATION  CT CHEST The following report is an over-read performed by radiologist Dr. Laurence Ferrari [facility] on 10/09/2015. This over-read does not include interpretation of cardiac or coronary anatomy or pathology. The coronary CTA interpretation by the cardiologist is attached. COMPARISON:  03/15/2013 FINDINGS: Mediastinum/Nodes: Note made of ascending aortic aneurysm 4.3 cm diameter. Small mediastinal lymph nodes are not pathologically enlarged by size criteria. Mild distal esophageal wall thickening.  This is circumferential. Lungs/Pleura: Mild dependent subsegmental atelectasis in both lower lobes. Subtle nodularity to the apparent scarring or atelectasis in the left lower lobe on images 28-30 series 204, this area somewhat obscured on prior CT chest from 2014 due to pleural effusions and atelectasis. Largest nodular component 3 by 5 mm. Upper abdomen: Unremarkable Musculoskeletal: Unremarkable IMPRESSION: 1. Ascending aortic aneurysm 4.3 cm in diameter. Recommend annual imaging followup by CTA or MRA. This recommendation follows 2010 ACCF/AHA/AATS/ACR/ASA/SCA/SCAI/SIR/STS/SVM Guidelines for the Diagnosis and Management of Patients with Thoracic Aortic Disease. Circulation. 2010; 121: [id number] 2. Mild distal esophageal wall thickening circumferentially, probably from esophagitis. 3. Mild dependent subsegmental atelectasis in both lower lobes. 4. Average size 4 mm nodule  in the left lower lobe. If the patient is at low risk for lung cancer, no further follow up is necessary. If the patient is at high risk for lung cancer, a follow up CT of the chest in 12 months time should be considered. This corresponds to the Fleischner 2017 revised guidelines. Electronically Signed: By: Van Clines M.D. On: 10/09/2015 10:18    ASSESSMENT AND PLAN:  Active Problems:   AF (atrial fibrillation) (HCC) Had AF ablation yesterday without complication.  No further AF overnight, plan for discharge today with switch from Xarelto to Eliquis on discharge.    Kensley Lares [name], MD 10/13/2015 8:19 AM

## 2015-10-15 ENCOUNTER — Encounter (HOSPITAL_COMMUNITY): Payer: Self-pay | Admitting: Cardiology

## 2015-10-15 LAB — POCT ACTIVATED CLOTTING TIME
ACTIVATED CLOTTING TIME: 322 s
Activated Clotting Time: 255 seconds
Activated Clotting Time: 327 seconds

## 2015-10-16 ENCOUNTER — Telehealth: Payer: Self-pay | Admitting: Physician Assistant

## 2015-10-16 ENCOUNTER — Telehealth: Payer: Self-pay | Admitting: *Deleted

## 2015-10-16 NOTE — Telephone Encounter (Signed)
lmtcb  (Called patient to see how she was doing post ablation.  Dr. [name] also wants to f/u on Eliquis and how she is doing on new medication)

## 2015-10-16 NOTE — Telephone Encounter (Signed)
Paged by answering service, temp of 100.5 2 hours ago, took Acetaminophen 500mg  about hours ago and recheck temp of 100.4. The patient had  afib ablation 3/24. No redness, edema or oozing at incision site. Non TTP. However admit to feeling weak, sneezing, running nose and sinus pressure. Suspect flu over infection at incision site. Advice to take Tylenol around the clock. If pain of redness at incision site please go to ER for further evaluation. Also advised to go to ER/urgent care for possible Flu (highly suspicious) evaluation as well, other wise call PCP. She wants to wait and treat as normal cold/flu symptoms and will make her decision by her self later.   Sheri [name], [place]

## 2015-10-17 NOTE — Telephone Encounter (Signed)
Follow up      Returning a nurses call.  Pt states she also has a fever.

## 2015-10-17 NOTE — Telephone Encounter (Signed)
Late entry: Spoke with patient around 3:30 pm. Informed her that fever shouldn't be related to procedure, per Dr. [name]. She will continue to f/u w/ PCP about this.  States she is complaint with Eliquis and doing well on medication. Informed Dr. [name].

## 2015-10-19 ENCOUNTER — Telehealth: Payer: Self-pay | Admitting: Cardiology

## 2015-10-19 NOTE — Telephone Encounter (Signed)
New message ° ° ° ° ° ° °Talk to the nurse.  Pt would not tell me what she wanted °

## 2015-10-19 NOTE — Telephone Encounter (Signed)
Informed pt Dr. [name] not concerned at this time with reported information. Advised pt to call office if symptoms begin or HR becomes persistently irregular.  Pt is agreeable to plan and thanks me for helping.

## 2015-10-19 NOTE — Telephone Encounter (Signed)
Last couple of days she has been feeling "like skipped beats". "Feels like 2 beats together when I am sitting". Hrs avg. 70-80s. Discussed that it is not uncommon to feel break through episodes of AFib post ablation for several months. Discussed that is is not uncommon to feel abnormal/skipped beats/2 beats together post ablation while "heart is healing". Pt aware I will forward to Dr. [name] for his advisement on this concern.  (Do you want EKG/monitor to determine activity?) Pt aware I will call her once he has reviewed.

## 2015-10-21 ENCOUNTER — Telehealth: Payer: Self-pay | Admitting: Cardiology

## 2015-10-21 NOTE — Telephone Encounter (Signed)
Pt called "pea sized" spot on her groin after RFA. No pain, swelling, or drainage. Reassured.  Kerin Ransom PA-C 10/21/2015 1:23 PM

## 2015-10-22 ENCOUNTER — Encounter: Payer: Medicare HMO | Admitting: Obstetrics & Gynecology

## 2015-10-25 ENCOUNTER — Encounter: Payer: Medicare HMO | Admitting: Obstetrics & Gynecology

## 2015-10-30 ENCOUNTER — Ambulatory Visit: Payer: Medicare HMO | Admitting: Dietician

## 2015-10-30 NOTE — Addendum Note (Signed)
Addended by: Hulan Fray on: 10/30/2015 08:28 PM   Modules accepted: Orders

## 2015-11-07 ENCOUNTER — Ambulatory Visit: Payer: Medicare HMO | Admitting: Dietician

## 2015-11-13 ENCOUNTER — Inpatient Hospital Stay (HOSPITAL_COMMUNITY): Admission: RE | Admit: 2015-11-13 | Payer: Medicare HMO | Source: Ambulatory Visit | Admitting: Nurse Practitioner

## 2015-11-13 ENCOUNTER — Ambulatory Visit: Payer: Medicare HMO | Admitting: Dietician

## 2015-11-19 ENCOUNTER — Encounter (HOSPITAL_COMMUNITY): Payer: Self-pay | Admitting: Nurse Practitioner

## 2015-11-19 ENCOUNTER — Ambulatory Visit (HOSPITAL_COMMUNITY)
Admission: RE | Admit: 2015-11-19 | Discharge: 2015-11-19 | Disposition: A | Discharge status: Home / Self Care | Payer: Medicare HMO | Source: Ambulatory Visit | Attending: Nurse Practitioner | Admitting: Nurse Practitioner

## 2015-11-19 VITALS — BP 100/68 | HR 84 | Ht 65.5 in | Wt 151.8 lb

## 2015-11-19 DIAGNOSIS — Z79899 Other long term (current) drug therapy: Secondary | ICD-10-CM | POA: Diagnosis not present

## 2015-11-19 DIAGNOSIS — Z8249 Family history of ischemic heart disease and other diseases of the circulatory system: Secondary | ICD-10-CM | POA: Diagnosis not present

## 2015-11-19 DIAGNOSIS — I480 Paroxysmal atrial fibrillation: Secondary | ICD-10-CM | POA: Insufficient documentation

## 2015-11-19 DIAGNOSIS — F329 Major depressive disorder, single episode, unspecified: Secondary | ICD-10-CM | POA: Insufficient documentation

## 2015-11-19 DIAGNOSIS — Z9889 Other specified postprocedural states: Secondary | ICD-10-CM | POA: Diagnosis not present

## 2015-11-19 DIAGNOSIS — F419 Anxiety disorder, unspecified: Secondary | ICD-10-CM | POA: Diagnosis not present

## 2015-11-19 NOTE — Progress Notes (Signed)
Patient ID: Sheri [name], female   DOB: 1950/11/22, 65 y.o.   MRN: [id number]     Primary Care Physician: Julious Oka, MD Referring Physician: Dr. Gerre Pebbles Sheri [name] is a 65 y.o. female with a h/o PAF, s/p ablation one month ago by Dr. [name]. She reports a few skipped beats occassonal but no sustained afib since procedure. No issues with groins or swallowing. She has noticed a"small lump" rt groin, non painful, reassured that this is normal and will resolve.  She says that she stopped her blood thinner on her own 10 days ago because she thought it was making her have some lightheadedness. Explained her risk of stroke after the ablation and with a chadsvasc score of 2(female/HTN), and emphasized that she needs to stay on anticoagulant. She is willing to go back on drug.  Today, she denies symptoms of   chest pain, shortness of breath, orthopnea, PND, lower extremity edema, dizziness, presyncope, syncope, or neurologic sequela. A few episodes of skipped beats. Is otherwise without complaint today.   Past Medical History  Diagnosis Date  . Anxiety   . Depression     treated at [facility]  . Poisoning by selective serotonin reuptake inhibitors(969.03) Jan 2007    severe reaction with MAOI (nardil) and dextromethorphan  . Goiter     hx of goiter  . Allergic rhinitis   . Chronic sinusitis   . DEGENERATIVE JOINT DISEASE, HANDS     several images revealing OA  . Palpitations     Associated with difficulty breathing  . Tachycardia   . Panic attacks   . Atrial fibrillation with RVR ([place])   . Hx of atrial tachycardia since age 58 03/15/2013   Past Surgical History  Procedure Laterality Date  . Right mastoidectomy      ET tube placement in 1980 and 1990  . Thyroidectomy, partial      nodule removal - April 1996  . Right sinus surgery  october 2000  . Ovarian cyst removal  march 1969    left ovarian cystectomy  . Appendectomy    . Electrophysiologic study N/A 10/12/2015   Procedure: Atrial Fibrillation Ablation;  Surgeon: [name], MD;  Location: [facility];  Service: Cardiovascular;  Laterality: N/A;    Current Outpatient Prescriptions  Medication Sig Dispense Refill  . ALPRAZolam (XANAX) 0.5 MG tablet Take 0.5 mg by mouth 3 (three) times daily as needed for anxiety.     . gabapentin (NEURONTIN) 300 MG capsule Take 300 mg by mouth 4 (four) times daily.     . propranolol (INDERAL) 10 MG tablet Take 1 tablet (10 mg total) by mouth 3 (three) times daily. 270 tablet 1  . QUEtiapine (SEROQUEL) 200 MG tablet Take 100 mg by mouth at bedtime.    . traZODone (DESYREL) 100 MG tablet Take 100 mg by mouth at bedtime.    Marland Kitchen venlafaxine XR (EFFEXOR-XR) 75 MG 24 hr capsule Take 75 mg by mouth 3 (three) times daily.     Marland Kitchen apixaban (ELIQUIS) 5 MG TABS tablet Take 1 tablet (5 mg total) by mouth 2 (two) times daily. (Patient not taking: Reported on 11/19/2015) 60 tablet 11   No current facility-administered medications for this encounter.    Allergies  Allergen Reactions  . Guaifenesin Palpitations    Social History   Social History  . Marital Status: Widowed    Spouse Name: N/A  . Number of Children: N/A  . Years of  Education: N/A   Occupational History  . 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 Smoking status: Never Smoker   . Smokeless tobacco: Never Used  . Alcohol Use: No  . Drug Use: No  . Sexual Activity: Not on file   Other Topics Concern  . Not on file   Social History Narrative   Lives alone.    Family History  Problem Relation Age of Onset  . Hypertension Mother   . Hypertension Father   . Psoriasis Father   . Atrial fibrillation Sister     had a successful a fib ablation  . Atrial fibrillation Sister     had a successful a fib ablation    ROS- All systems are reviewed and negative except as per the HPI above  Physical Exam: Filed Vitals:   11/19/15 1026  BP: 100/68  Pulse: 84  Height:  5' 5.5" (1.664 m)  Weight: 151 lb 12.8 oz (68.856 kg)    GEN- The patient is well appearing, alert and oriented x 3 today.   Head- normocephalic, atraumatic Eyes-  Sclera clear, conjunctiva pink Ears- hearing intact Oropharynx- clear Neck- supple, no JVP Lymph- no cervical lymphadenopathy Lungs- Clear to ausculation bilaterally, normal work of breathing Heart- Regular rate and rhythm, no murmurs, rubs or gallops, PMI not laterally displaced GI- soft, NT, ND, + BS Extremities- no clubbing, cyanosis, or edema MS- no significant deformity or atrophy Skin- no rash or lesion Psych- euthymic mood, full affect Neuro- strength and sensation are intact  EKG-NSR, normal EKG, pr int 130 ms, qrs int 68 ms, QT sec 423 ms Epic records reviewed  Assessment and Plan: 1. PAF One month s/p ablation Pt has done well as far of reoccurrence of afib,  But increased stroke risk due to stopping DOAC on her own Educated re risk of stroke and with chadsvasc of 2,(Female,age) should continue on eliquis without interuption She voiced understanding  states she will restart drug  F/u with Dr. [name] 6/30 Afib clinic as needed   Sheri [name], [facility] [address] [place], [place] [address] [phone]

## 2015-11-23 ENCOUNTER — Encounter: Payer: Self-pay | Admitting: Cardiology

## 2015-12-14 ENCOUNTER — Other Ambulatory Visit: Payer: Self-pay | Admitting: Internal Medicine

## 2015-12-14 DIAGNOSIS — E669 Obesity, unspecified: Secondary | ICD-10-CM

## 2016-01-10 ENCOUNTER — Telehealth: Payer: Self-pay

## 2016-01-16 ENCOUNTER — Encounter: Payer: Medicare HMO | Admitting: Dietician

## 2016-01-16 ENCOUNTER — Encounter: Payer: Medicare HMO | Admitting: Internal Medicine

## 2016-01-16 NOTE — Telephone Encounter (Signed)
Sheri [name] is a 65 y.o. female who was contacted via telephone for monitoring of apixaban (Eliquis) therapy.    ASSESSMENT Indication(s): atrial fibrillation Duration: indefinite  Labs:    Component Value Date/Time   AST 18 11/15/2014 1618   ALT 17 11/15/2014 1618   NA 141 10/09/2015 1107   K 4.1 10/09/2015 1107   CL 107 10/09/2015 1107   CO2 27 10/09/2015 1107   GLUCOSE 62* 10/09/2015 1107   BUN 9 10/09/2015 1107   CREATININE 0.66 10/09/2015 1107   CREATININE 0.60 07/25/2014 1807   CALCIUM 8.5* 10/09/2015 1107   GFRNONAA 86 11/15/2014 1618   GFRNONAA >90 03/16/2013 0840   GFRAA >89 11/15/2014 1618   GFRAA >90 03/16/2013 0840   WBC 7.6 10/09/2015 1107   HGB 13.2 10/09/2015 1107   HCT 39.4 10/09/2015 1107   PLT 327 10/09/2015 1107    apixaban (Eliquis) Dose: 5 mg BID  Adherence: Patient reports adherence challenges. Patient reports not taking Eliquis. She states she doesn't like the way it makes her feel (makes her tired). Contacted pharmacy and records indicate refills are not consistent. 3/25 last fill (30 days). Educated that there are other anticoag options to try but stressed importance of being on blood thinner. She stated she was not open to taking any anticoagulant and felt fine. Educated on s/s of clots.  Patient Instructions: Patient advised to contact clinic or seek medical attention if signs/symptoms of bleeding or thromboembolism occur. Patient verbalized understanding by repeating back information.  Follow-up Next appointment 01/16/16.  Angelena Form PharmD Candidate  01/16/2016, 2:03 PM

## 2016-01-18 ENCOUNTER — Ambulatory Visit: Payer: Medicare HMO | Admitting: Cardiology

## 2016-01-30 ENCOUNTER — Encounter: Payer: Medicare HMO | Admitting: Internal Medicine

## 2016-01-30 ENCOUNTER — Encounter: Payer: Medicare HMO | Admitting: Dietician

## 2016-02-13 ENCOUNTER — Encounter: Payer: Medicare HMO | Admitting: Dietician

## 2016-02-13 ENCOUNTER — Encounter: Payer: Medicare HMO | Admitting: Internal Medicine

## 2016-02-13 ENCOUNTER — Encounter: Payer: Self-pay | Admitting: Internal Medicine

## 2016-02-13 ENCOUNTER — Ambulatory Visit: Payer: Medicare HMO | Admitting: Cardiology

## 2016-02-13 NOTE — Progress Notes (Deleted)
Electrophysiology Office Note   Date:  02/13/2016   ID:  Akeria, Edley 11-Feb-1951, MRN [id number]  PCP:  Julious Oka, MD  Cardiologist:  Debara Pickett Primary Electrophysiologist:  Chelsei Mcchesney [name], MD    No chief complaint on file.    History of Present Illness: Sheri [name] is a 65 y.o. female who presents today for electrophysiology evaluation.   She has a history of atrial fibrillation s/p ablation on 10/12/15.   Today, she denies symptoms of palpitations, chest pain, shortness of breath, orthopnea, PND, lower extremity edema, claudication, dizziness, presyncope, syncope, bleeding, or neurologic sequela. The patient is tolerating medications without difficulties and is otherwise without complaint today.  Past Medical History:  Diagnosis Date  . Allergic rhinitis   . Anxiety   . Atrial fibrillation with RVR ([place])   . Chronic sinusitis   . DEGENERATIVE JOINT DISEASE, HANDS    several images revealing OA  . Depression    treated at [facility]  . Goiter    hx of goiter  . Hx of atrial tachycardia since age 25 03/15/2013  . Palpitations    Associated with difficulty breathing  . Panic attacks   . Poisoning by selective serotonin reuptake inhibitors(969.03) Jan 2007   severe reaction with MAOI (nardil) and dextromethorphan  . Tachycardia    Past Surgical History:  Procedure Laterality Date  . APPENDECTOMY    . ELECTROPHYSIOLOGIC STUDY N/A 10/12/2015   Procedure: Atrial Fibrillation Ablation;  Surgeon: Barabara Motz [name], MD;  Location: [facility];  Service: Cardiovascular;  Laterality: N/A;  . OVARIAN CYST REMOVAL  march 1969   left ovarian cystectomy  . right mastoidectomy     ET tube placement in 1980 and 1990  . right sinus surgery  october 2000  . THYROIDECTOMY, PARTIAL     nodule removal - April 1996     Current Outpatient Prescriptions  Medication Sig Dispense Refill  . ALPRAZolam (XANAX) 0.5 MG tablet Take 0.5 mg by mouth 3 (three) times  daily as needed for anxiety.     Marland Kitchen apixaban (ELIQUIS) 5 MG TABS tablet Take 1 tablet (5 mg total) by mouth 2 (two) times daily. (Patient not taking: Reported on 11/19/2015) 60 tablet 11  . gabapentin (NEURONTIN) 300 MG capsule Take 300 mg by mouth 4 (four) times daily.     . propranolol (INDERAL) 10 MG tablet Take 1 tablet (10 mg total) by mouth 3 (three) times daily. 270 tablet 1  . QUEtiapine (SEROQUEL) 200 MG tablet Take 100 mg by mouth at bedtime.    . traZODone (DESYREL) 100 MG tablet Take 100 mg by mouth at bedtime.    Marland Kitchen venlafaxine XR (EFFEXOR-XR) 75 MG 24 hr capsule Take 75 mg by mouth 3 (three) times daily.      No current facility-administered medications for this visit.     Allergies:   Guaifenesin   Social History:  The patient  reports that she has never smoked. She has never used smokeless tobacco. She reports that she does not drink alcohol or use drugs.   Family History:  The patient's family history includes Atrial fibrillation in her sister and sister; Hypertension in her father and mother; Psoriasis in her father.    ROS:  Please see the history of present illness.   Otherwise, review of systems is positive for ***.   All other systems are reviewed and negative.    PHYSICAL EXAM: VS:  There were no vitals taken for  this visit. , BMI There is no height or weight on file to calculate BMI. GEN: Well nourished, well developed, in no acute distress  HEENT: normal  Neck: no JVD, carotid bruits, or masses Cardiac: ***RRR; no murmurs, rubs, or gallops,no edema  Respiratory:  clear to auscultation bilaterally, normal work of breathing GI: soft, nontender, nondistended, + BS MS: no deformity or atrophy  Skin: warm and dry Neuro:  Strength and sensation are intact Psych: euthymic mood, full affect  EKG:  EKG is ordered today. The ekg ordered today shows ***  Recent Labs: 10/09/2015: BUN 9; Creat 0.66; Hemoglobin 13.2; Platelets 327; Potassium 4.1; Sodium 141    Lipid  Panel     Component Value Date/Time   CHOL 111 03/16/2013 0355   TRIG 70 03/16/2013 0355   HDL 39 (L) 03/16/2013 0355   CHOLHDL 2.8 03/16/2013 0355   VLDL 14 03/16/2013 0355   LDLCALC 58 03/16/2013 0355     Wt Readings from Last 3 Encounters:  11/19/15 151 lb 12.8 oz (68.9 kg)  10/12/15 150 lb (68 kg)  09/18/15 154 lb 6.4 oz (70 kg)      Other studies Reviewed: Additional studies/ records that were reviewed today include: TTE 2014  Review of the above records today demonstrates:  - Left ventricle: The cavity size was normal. Wall thickness was increased in a pattern of mild LVH. Systolic function was normal. The estimated ejection fraction was in the range of 55% to 60%. Wall motion was normal; there were no regional wall motion abnormalities. Left ventricular diastolic function parameters were normal. - Left atrium: The atrium was normal in size. - Tricuspid valve: Poorly visualized. Trivial regurgitation. - Pulmonary arteries: PA peak pressure: 52mm Hg (S). - Inferior vena cava: The vessel was normal in size; the respirophasic diameter changes were in the normal range  50%); findings are consistent with normal central venous pressure.   ASSESSMENT AND PLAN:  1.  Paroxysmal atrial fibrillation: On Xarelto s/p AF ablation.  This patients CHA2DS2-VASc Score and unadjusted Ischemic Stroke Rate (% per year) is equal to 2.2 % stroke rate/year from a score of 2  Above score calculated as 1 point each if present [CHF, HTN, DM, Vascular=MI/PAD/Aortic Plaque, Age if 65-74, or Female] Above score calculated as 2 points each if present [Age > 75, or Stroke/TIA/TE]  Current medicines are reviewed at length with the patient today.   The patient does not have concerns regarding her medicines.  The following changes were made today:  Xarelto, flecainide  Labs/ tests ordered today include:  No orders of the defined types were placed in this  encounter.    Disposition:   FU with Caci Orren  3 months  Signed, Rio Taber [name], MD  02/13/2016 7:13 AM     [facility] [address] [place] [address] [phone] (office) [phone] (fax)

## 2016-02-22 NOTE — Telephone Encounter (Signed)
Patient was contacted by Kym Groom, PharmD candidate. I agree with the assessment and plan of care documented. May need to consider alternative anticoagulant in the future.

## 2016-02-25 ENCOUNTER — Ambulatory Visit: Payer: Medicare HMO | Admitting: Cardiology

## 2016-02-25 ENCOUNTER — Encounter: Payer: Medicare HMO | Admitting: Dietician

## 2016-02-25 NOTE — Progress Notes (Deleted)
Electrophysiology Office Note   Date:  02/25/2016   ID:  Sheri [name], Sheri [name] Oct 29, 1950, MRN [id number]  PCP:  Julious Oka, MD  Cardiologist:  Debara Pickett Primary Electrophysiologist:  Zakia Sainato [name], MD    No chief complaint on file.    History of Present Illness: Sheri [name] is a 65 y.o. female who presents today for electrophysiology evaluation.   She has a history of atrial fibrillation.    She admitted in 02/2013 for A fib with RVR she was placed on procainamide drip. She then converted to SR. She had echo done which was generally normal. She does report 2 of her sisters with the same disorder - they have both undergone a-fib ablations which have successfully terminated their arrhythmias. She is treated for anxiety and panic attacks with seroquel, trazadone, effexor and xanax.  She had an AF ablation 10/12/15.  Today, she denies symptoms of palpitations, chest pain, shortness of breath, orthopnea, PND, lower extremity edema, claudication, dizziness, presyncope, syncope, bleeding, or neurologic sequela. The patient is tolerating medications without difficulties and is otherwise without complaint today.    Past Medical History:  Diagnosis Date  . Allergic rhinitis   . Anxiety   . Atrial fibrillation with RVR ([place])   . Chronic sinusitis   . DEGENERATIVE JOINT DISEASE, HANDS    several images revealing OA  . Depression    treated at [facility]  . Goiter    hx of goiter  . Hx of atrial tachycardia since age 37 03/15/2013  . Palpitations    Associated with difficulty breathing  . Panic attacks   . Poisoning by selective serotonin reuptake inhibitors(969.03) Jan 2007   severe reaction with MAOI (nardil) and dextromethorphan  . Tachycardia    Past Surgical History:  Procedure Laterality Date  . APPENDECTOMY    . ELECTROPHYSIOLOGIC STUDY N/A 10/12/2015   Procedure: Atrial Fibrillation Ablation;  Surgeon: Allessandra Bernardi [name], MD;  Location: [facility];  Service:  Cardiovascular;  Laterality: N/A;  . OVARIAN CYST REMOVAL  march 1969   left ovarian cystectomy  . right mastoidectomy     ET tube placement in 1980 and 1990  . right sinus surgery  october 2000  . THYROIDECTOMY, PARTIAL     nodule removal - April 1996     Current Outpatient Prescriptions  Medication Sig Dispense Refill  . ALPRAZolam (XANAX) 0.5 MG tablet Take 0.5 mg by mouth 3 (three) times daily as needed for anxiety.     Marland Kitchen apixaban (ELIQUIS) 5 MG TABS tablet Take 1 tablet (5 mg total) by mouth 2 (two) times daily. (Patient not taking: Reported on 11/19/2015) 60 tablet 11  . gabapentin (NEURONTIN) 300 MG capsule Take 300 mg by mouth 4 (four) times daily.     . propranolol (INDERAL) 10 MG tablet Take 1 tablet (10 mg total) by mouth 3 (three) times daily. 270 tablet 1  . QUEtiapine (SEROQUEL) 200 MG tablet Take 100 mg by mouth at bedtime.    . traZODone (DESYREL) 100 MG tablet Take 100 mg by mouth at bedtime.    Marland Kitchen venlafaxine XR (EFFEXOR-XR) 75 MG 24 hr capsule Take 75 mg by mouth 3 (three) times daily.      No current facility-administered medications for this visit.     Allergies:   Guaifenesin   Social History:  The patient  reports that she has never smoked. She has never used smokeless tobacco. She reports that she does not drink alcohol or  use drugs.   Family History:  The patient's family history includes Atrial fibrillation in her sister and sister; Hypertension in her father and mother; Psoriasis in her father.    ROS:  Please see the history of present illness.   Otherwise, review of systems is positive for ***.   All other systems are reviewed and negative.    PHYSICAL EXAM: VS:  There were no vitals taken for this visit. , BMI There is no height or weight on file to calculate BMI. GEN: Well nourished, well developed, in no acute distress  HEENT: normal  Neck: no JVD, carotid bruits, or masses Cardiac: ***RRR; no murmurs, rubs, or gallops,no edema  Respiratory:  clear  to auscultation bilaterally, normal work of breathing GI: soft, nontender, nondistended, + BS MS: no deformity or atrophy  Skin: warm and dry Neuro:  Strength and sensation are intact Psych: euthymic mood, full affect  EKG:  EKG is ordered today. Personal review of the ECG ordered today shows ***  Recent Labs: 10/09/2015: BUN 9; Creat 0.66; Hemoglobin 13.2; Platelets 327; Potassium 4.1; Sodium 141    Lipid Panel     Component Value Date/Time   CHOL 111 03/16/2013 0355   TRIG 70 03/16/2013 0355   HDL 39 (L) 03/16/2013 0355   CHOLHDL 2.8 03/16/2013 0355   VLDL 14 03/16/2013 0355   LDLCALC 58 03/16/2013 0355     Wt Readings from Last 3 Encounters:  11/19/15 151 lb 12.8 oz (68.9 kg)  10/12/15 150 lb (68 kg)  09/18/15 154 lb 6.4 oz (70 kg)      Other studies Reviewed: Additional studies/ records that were reviewed today include: TTE 2014  Review of the above records today demonstrates:  - Left ventricle: The cavity size was normal. Wall thickness was increased in a pattern of mild LVH. Systolic function was normal. The estimated ejection fraction was in the range of 55% to 60%. Wall motion was normal; there were no regional wall motion abnormalities. Left ventricular diastolic function parameters were normal. - Left atrium: The atrium was normal in size. - Tricuspid valve: Poorly visualized. Trivial regurgitation. - Pulmonary arteries: PA peak pressure: 20mm Hg (S). - Inferior vena cava: The vessel was normal in size; the respirophasic diameter changes were in the normal range  50%); findings are consistent with normal central venous pressure.   ASSESSMENT AND PLAN:  1.  Paroxysmal atrial fibrillation: She is now insured and on Xarelto.  Had AF ablation 10/12/15.  This patients CHA2DS2-VASc Score and unadjusted Ischemic Stroke Rate (% per year) is equal to 2.2 % stroke rate/year from a score of 2  Above score calculated as 1 point each if present  [CHF, HTN, DM, Vascular=MI/PAD/Aortic Plaque, Age if 65-74, or Female] Above score calculated as 2 points each if present [Age > 75, or Stroke/TIA/TE]  Current medicines are reviewed at length with the patient today.   The patient does not have concerns regarding her medicines.  The following changes were made today:  Xarelto, flecainide  Labs/ tests ordered today include:  No orders of the defined types were placed in this encounter.    Disposition:   FU with Yuta Cipollone  3 months  Signed, Meredith Mells [name], MD  02/25/2016 7:39 AM     [facility] [address] [place] [address] [phone] (office) [phone] (fax)

## 2016-02-29 ENCOUNTER — Emergency Department (HOSPITAL_COMMUNITY)
Admission: EM | Admit: 2016-02-29 | Discharge: 2016-02-29 | Disposition: A | Discharge status: Home / Self Care | Payer: Medicare HMO | Attending: Emergency Medicine | Admitting: Emergency Medicine

## 2016-02-29 ENCOUNTER — Encounter (HOSPITAL_COMMUNITY): Payer: Self-pay | Admitting: Emergency Medicine

## 2016-02-29 DIAGNOSIS — R42 Dizziness and giddiness: Secondary | ICD-10-CM | POA: Diagnosis not present

## 2016-02-29 DIAGNOSIS — Z7901 Long term (current) use of anticoagulants: Secondary | ICD-10-CM | POA: Diagnosis not present

## 2016-02-29 DIAGNOSIS — H938X3 Other specified disorders of ear, bilateral: Secondary | ICD-10-CM | POA: Insufficient documentation

## 2016-02-29 LAB — BASIC METABOLIC PANEL
ANION GAP: 7 (ref 5–15)
BUN: 7 mg/dL (ref 6–20)
CHLORIDE: 106 mmol/L (ref 101–111)
CO2: 26 mmol/L (ref 22–32)
Calcium: 9.3 mg/dL (ref 8.9–10.3)
Creatinine, Ser: 0.71 mg/dL (ref 0.44–1.00)
GFR calc Af Amer: >60 mL/min (ref >60)
GFR calc non Af Amer: >60 mL/min (ref >60)
GLUCOSE: 112 mg/dL — AB (ref 65–99)
POTASSIUM: 4 mmol/L (ref 3.5–5.1)
Sodium: 139 mmol/L (ref 135–145)

## 2016-02-29 LAB — CBC
HEMATOCRIT: 45.1 % (ref 36.0–46.0)
HEMOGLOBIN: 14.9 g/dL (ref 12.0–15.0)
MCH: 29.7 pg (ref 26.0–34.0)
MCHC: 33 g/dL (ref 30.0–36.0)
MCV: 90 fL (ref 78.0–100.0)
Platelets: 339 10*3/uL (ref 150–400)
RBC: 5.01 MIL/uL (ref 3.87–5.11)
RDW: 12.2 % (ref 11.5–15.5)
WBC: 10.8 10*3/uL — ABNORMAL HIGH (ref 4.0–10.5)

## 2016-02-29 LAB — CBG MONITORING, ED: Glucose-Capillary: 107 mg/dL — ABNORMAL HIGH (ref 65–99)

## 2016-02-29 MED ORDER — MECLIZINE HCL 25 MG PO TABS: 25.0000 mg | ORAL_TABLET | Freq: Three times a day (TID) | ORAL | 0 refills | Status: DC | PRN

## 2016-02-29 NOTE — ED Triage Notes (Signed)
Pt. Stated, I woke up after a good sleep I felt like the room was spinning, and as long as I was looking straight forward I was fine.  At one time I felt like I was going to pass out. It comes and goes.

## 2016-02-29 NOTE — ED Notes (Signed)
CBG is 107 

## 2016-02-29 NOTE — ED Provider Notes (Signed)
[facility] DEPT Provider Note   CSN: [id number] Arrival date & time: 02/29/16  [id number]  First Provider Contact:  First MD Initiated Contact with Patient 02/29/16 [phone]        History   Chief Complaint Chief Complaint  Patient presents with  . Dizziness    "my head was spinning"  . Near Syncope    HPI Sheri [name] is a 65 y.o. female.  The history is provided by the patient.  Dizziness  Quality:  Room spinning and head spinning Severity:  Severe Onset quality:  Sudden Duration: lasting from 1-4 minutes. Timing:  Intermittent Progression:  Waxing and waning Context: inactivity   Context: not with eye movement, not with head movement and not with loss of consciousness   Relieved by: self resolved. Worsened by:  Turning head and eye movement Ineffective treatments:  None tried Associated symptoms: nausea (during episodes) and tinnitus (at baseline)   Associated symptoms: no blood in stool, no chest pain, no diarrhea, no headaches, no syncope, no vision changes and no vomiting   Associated symptoms comment:  Stopped up ears Risk factors: heart disease (afib s/p ablastion suposed to be on Eliquis, but not taking)   Risk factors: no hx of stroke    Recently changed to generic Seroquel from name brand 2 days ago.  Past Medical History:  Diagnosis Date  . Allergic rhinitis   . Anxiety   . Atrial fibrillation with RVR ([place])   . Chronic sinusitis   . DEGENERATIVE JOINT DISEASE, HANDS    several images revealing OA  . Depression    treated at [facility]  . Goiter    hx of goiter  . Hx of atrial tachycardia since age 7 03/15/2013  . Palpitations    Associated with difficulty breathing  . Panic attacks   . Poisoning by selective serotonin reuptake inhibitors(969.03) Jan 2007   severe reaction with MAOI (nardil) and dextromethorphan  . Tachycardia     Patient Active Problem List   Diagnosis Date Noted  . AF (atrial fibrillation) ([place]) 10/12/2015  . Weight  gain 09/21/2015  . Vaginitis and vulvovaginitis 11/15/2014  . Healthcare maintenance 04/14/2013  . Atrial fibrillation with RVR ([place]) 03/15/2013  . Financial difficulties 09/09/2012  . GERD 11/17/2008  . LOSS, HEARING NOS 12/02/2006  . ANXIETY DISORDER, GENERALIZED 08/18/2006  . Depression 08/18/2006  . SINUSITIS, CHRONIC NOS 08/18/2006    Past Surgical History:  Procedure Laterality Date  . APPENDECTOMY    . ELECTROPHYSIOLOGIC STUDY N/A 10/12/2015   Procedure: Atrial Fibrillation Ablation;  Surgeon: [name], MD;  Location: [facility];  Service: Cardiovascular;  Laterality: N/A;  . OVARIAN CYST REMOVAL  march 1969   left ovarian cystectomy  . right mastoidectomy     ET tube placement in 1980 and 1990  . right sinus surgery  october 2000  . THYROIDECTOMY, PARTIAL     nodule removal - April 1996    OB History    Gravida Para Term Preterm AB Living   4 3 3   1 3    SAB TAB Ectopic Multiple Live Births   1               Home Medications    Prior to Admission medications   Medication Sig Start Date End Date Taking? Authorizing Provider  ALPRAZolam Duanne Moron) 0.5 MG tablet Take 0.5 mg by mouth 2 (two) times daily.    Yes Historical Provider, MD  gabapentin (NEURONTIN) 300 MG  capsule Take 300 mg by mouth 3 (three) times daily.    Yes Historical Provider, MD  propranolol (INDERAL) 10 MG tablet Take 1 tablet (10 mg total) by mouth 3 (three) times daily. Patient taking differently: Take 10 mg by mouth 3 (three) times daily as needed (for heart).  08/28/15  Yes [name], MD  QUEtiapine (SEROQUEL) 200 MG tablet Take 100 mg by mouth at bedtime.   Yes Historical Provider, MD  traZODone (DESYREL) 100 MG tablet Take 100 mg by mouth at bedtime.   Yes Historical Provider, MD  venlafaxine XR (EFFEXOR-XR) 75 MG 24 hr capsule Take 75 mg by mouth 3 (three) times daily.    Yes Historical Provider, MD  apixaban (ELIQUIS) 5 MG TABS tablet Take 1 tablet (5 mg total) by  mouth 2 (two) times daily. Patient not taking: Reported on 11/19/2015 10/13/15   Leanor Kail, PA    Family History Family History  Problem Relation Age of Onset  . Hypertension Mother   . Hypertension Father   . Psoriasis Father   . Atrial fibrillation Sister     had a successful a fib ablation  . Atrial fibrillation Sister     had a successful a fib ablation    Social History Social History  Substance Use Topics  . Smoking status: Never Smoker  . Smokeless tobacco: Never Used  . Alcohol use No     Allergies   Guaifenesin   Review of Systems Review of Systems  HENT: Positive for tinnitus (at baseline).   Cardiovascular: Negative for chest pain and syncope.  Gastrointestinal: Positive for nausea (during episodes). Negative for blood in stool, diarrhea and vomiting.  Neurological: Positive for dizziness. Negative for headaches.  All other systems reviewed and are negative.    Physical Exam Updated Vital Signs BP 116/75 (BP Location: Right Arm)   Pulse 84   Temp 98.7 F (37.1 C) (Oral)   Resp 18   Ht 5' 5.5" (1.664 m)   Wt 148 lb 3.2 oz (67.2 kg)   SpO2 99%   BMI 24.29 kg/m   Physical Exam  Constitutional: She is oriented to person, place, and time. She appears well-developed and well-nourished. No distress.  HENT:  Head: Normocephalic and atraumatic.  Right Ear: External ear normal. Tympanic membrane is not erythematous. A middle ear effusion is present.  Left Ear: External ear normal. Tympanic membrane is not erythematous. A middle ear effusion is present.  Nose: Nose normal.  Eyes: Conjunctivae and EOM are normal. Pupils are equal, round, and reactive to light. Right eye exhibits no discharge. Left eye exhibits no discharge. No scleral icterus.  Neck: Normal range of motion. Neck supple.  Cardiovascular: Normal rate, regular rhythm and normal heart sounds.  Exam reveals no gallop and no friction rub.   No murmur heard. Pulmonary/Chest: Effort normal  and breath sounds normal. No stridor. No respiratory distress. She has no wheezes.  Abdominal: Soft. She exhibits no distension. There is no tenderness.  Musculoskeletal: She exhibits no edema or tenderness.  Neurological: She is alert and oriented to person, place, and time.  Mental Status: Alert and oriented to person, place, and time. Attention and concentration normal. Speech clear. Recent memory is intac  Cranial Nerves  II Visual Fields: Intact to confrontation. Visual fields intact. III, IV, VI: Pupils equal and reactive to light and near. Full eye movement without nystagmus  V Facial Sensation: Normal. No weakness of masticatory muscles  VII: No facial weakness or asymmetry  VIII Auditory Acuity: Grossly normal  IX/X: The uvula is midline; the palate elevates symmetrically  XI: Normal sternocleidomastoid and trapezius strength  XII: The tongue is midline. No atrophy or fasciculations.   Motor System: Muscle Strength: 5/5 and symmetric in the upper and lower extremities. No pronation or drift.  Muscle Tone: Tone and muscle bulk are normal in the upper and lower extremities.   Reflexes: DTRs: 2+ and symmetrical in all four extremities. Plantar responses are flexor bilaterally.  Coordination: Intact finger-to-nose, heel-to-shin, and rapid alternating movements. No tremor.  Sensation: Intact to light touch, and pinprick. Negative Romberg test.  Gait: Routine and tandem gait are normal   HINTS: 1. Head impulse negative 2. Test of skew without deviation 3. No nystagmus  Skin: Skin is warm and dry. No rash noted. She is not diaphoretic. No erythema.  Psychiatric: She has a normal mood and affect.     ED Treatments / Results  Labs (all labs ordered are listed, but only abnormal results are displayed) Labs Reviewed  CBC - Abnormal; Notable for the following:       Result Value   WBC 10.8 (*)    All other components within normal limits  CBG MONITORING, ED - Abnormal; Notable  for the following:    Glucose-Capillary 107 (*)    All other components within normal limits  BASIC METABOLIC PANEL  URINALYSIS, ROUTINE W REFLEX MICROSCOPIC (NOT AT [facility])    EKG  EKG Interpretation None       Radiology No results found.  Procedures Procedures (including critical care time)  Medications Ordered in ED Medications - No data to display   Initial Impression / Assessment and Plan / ED Course  I have reviewed the triage vital signs and the nursing notes.  Pertinent labs & imaging results that were available during my care of the patient were reviewed by me and considered in my medical decision making (see chart for details).  Clinical Course    No neurologic deficits on exam. Heart rate regular without any arrhythmias. Low suspicion for a CVA at this time.  Exam was notable for middle ear effusion without evidence of otitis. Patient reports history of the same with prior dizziness episodes that resolved after having a drain tubes placed within the inner ear itself. This was performed in 1980s. She has not had any spells like this since. While patient follow-up with ENT for further evaluation and assessment possible Mnire's disease.  On review of records and noted a CT scan from 2007 that revealed Chiari malformation type I. Will have patient follow-up with neurosurgery for further evaluation to see if this might be another cause of her symptoms.  Patient has been asymptomatic since arrival. Feels she is safe for discharge with strict return precautions. Patient to follow-up as above. We'll provide the patient with prescription for Antivert.     Final Clinical Impressions(s) / ED Diagnoses   Final diagnoses:  None   Disposition: Discharge  Condition: Good  I have discussed the results, Dx and Tx plan with the patient who expressed understanding and agree(s) with the plan. Discharge instructions discussed at great length. The patient was given strict  return precautions who verbalized understanding of the instructions. No further questions at time of discharge.    New Prescriptions   MECLIZINE (ANTIVERT) 25 MG TABLET    Take 1 tablet (25 mg total) by mouth 3 (three) times daily as needed for dizziness.    Follow Up: [facility] [place] [phone] Call  For help establishing care with a care provider  Melissa Montane, MD [address] [place] [address] [phone]  Schedule an appointment as soon as possible for a visit  to assess for inter ear tube function  Consuella Lose, MD [address] [place] [address] [place] [address] [phone]   to assess the Chiari I malformation      Fatima Blank, MD 02/29/16 [phone]

## 2016-03-04 NOTE — Addendum Note (Signed)
Addended by: Hulan Fray on: 03/04/2016 07:28 PM   Modules accepted: Orders

## 2016-03-06 ENCOUNTER — Telehealth: Payer: Self-pay | Admitting: Internal Medicine

## 2016-03-06 NOTE — Telephone Encounter (Signed)
APT. REMINDER CALL, LMTCB °

## 2016-03-07 ENCOUNTER — Ambulatory Visit (INDEPENDENT_AMBULATORY_CARE_PROVIDER_SITE_OTHER): Payer: Medicare HMO | Admitting: Internal Medicine

## 2016-03-07 VITALS — BP 102/57 | HR 86 | Temp 99.1°F | Ht 65.5 in | Wt 149.4 lb

## 2016-03-07 DIAGNOSIS — Z7901 Long term (current) use of anticoagulants: Secondary | ICD-10-CM | POA: Diagnosis not present

## 2016-03-07 DIAGNOSIS — Z9114 Patient's other noncompliance with medication regimen: Secondary | ICD-10-CM

## 2016-03-07 DIAGNOSIS — I4891 Unspecified atrial fibrillation: Secondary | ICD-10-CM

## 2016-03-07 DIAGNOSIS — G935 Compression of brain: Secondary | ICD-10-CM | POA: Insufficient documentation

## 2016-03-07 DIAGNOSIS — H8109 Meniere's disease, unspecified ear: Secondary | ICD-10-CM | POA: Diagnosis not present

## 2016-03-07 DIAGNOSIS — I480 Paroxysmal atrial fibrillation: Secondary | ICD-10-CM

## 2016-03-07 HISTORY — DX: Compression of brain: G93.5

## 2016-03-07 HISTORY — DX: Meniere's disease, unspecified ear: H81.09

## 2016-03-07 MED ORDER — MECLIZINE HCL 25 MG PO TABS: 25.0000 mg | ORAL_TABLET | Freq: Three times a day (TID) | ORAL | 0 refills | Status: DC | PRN

## 2016-03-07 NOTE — Assessment & Plan Note (Signed)
Patient was recently seen in the ED for complaint of acute vertigo. Patient states on Friday 8/11, she was awoken from sleep due to vertigo with the room spinning and nausea. Her symptoms came on suddenly and lasted for several minutes and would resolve spontaneously, but reoccur over the next few hours. She admits to a history of Meniere's disease s/p surgery by ENT in the 1980s. She has a chronic history of tinnitus. She denies associated precipitation of symptoms by head movement or position, associated migraine headache, weakness, numbness, difficult gait, dysarthria or dysphagia. She denies any recent viral illness. In the ED, CBC and BMET were within normal limits. Neurologic exam was normal but effusion was noted behind her right tympanic membrane without associated otitis. She was discharged with Meclizine. She reports improvement in her symptoms with only mild vertiginous symptoms occasionally as long as she takes the meclizine. She requests a referral back to ENT. Patient is resistant to trying vestibular rehab at this time and would like to discuss with ENT.  Assessment: Meniere's Disease  Plan: -Referral back to Dr. Ernesto Rutherford, ENT -Refilled Meclizine 25 mg TID prn -Consider vestibular rehab

## 2016-03-07 NOTE — Assessment & Plan Note (Signed)
Upon reviewing patient's imaging, a chiari I malformation was noted from head CT in May 2007. Report reads low lying cerebellar tonsils consistent with Chiari I Malformation. Vertiginous symptoms are not classic for Chiari malformations; therefore, I feel patient has asymptomatic Chiari I Malformation. The guidelines recommend yearly MRIs, but patient wants [place] to place a referral to neurosurgery and then have NS order the MRI.  Plan: -Referral placed to Neurosurgery

## 2016-03-07 NOTE — Patient Instructions (Signed)
CONTINUE MECLIZINE 25 MG THREE TIMES A DAY AS NEEDED. WE WILL REFER YOU BACK TO EAR, NOSE, THROAT DOCTOR. PLEASE CONSIDER VESTIBULAR REHAB FOR YOUR SYMPTOMS. WE WILL BE HAPPY TO PLACE THAT REFERRAL ONCE YOU ARE READY.  FOR YOUR CHIARI I MALFORMATION, WE HAVE REFERRED YOU TO NEUROSURGERY.  FOR YOUR ATRIAL FIBRILLATION, PLEASE TAKE ELIQUIS AND FOLLOW UP WITH CARDIOLOGY ON Monday.

## 2016-03-07 NOTE — Assessment & Plan Note (Signed)
Patient underwent successful ablation for her atrial fibrillation in March 2017. Patient was discharged with Eliquis due to Hydro score of 2. Patient has not been compliant with medication. Despite discussion of benefits, patient is resistant to taking it. However, she does agree to discuss this further with Cardiology at her follow up appointment on Monday. Patient is regular rate and rhythm on exam today. EKG from 8/11 was normal sinus rhythm.   Plan: -Take Eliquis BID for atrial fibrillation -Follow up with Cardiology

## 2016-03-07 NOTE — Progress Notes (Signed)
    CC: Vertigo  HPI: Sheri [name] is a 65 y.o. female with PMHx of Atrial Fibrillation s/p Ablation and GERD who presents to the clinic for follow up for Vertigo.  Patient was recently seen in the ED for complaint of acute vertigo. Patient states on Friday 8/11, she was awoken from sleep due to vertigo with the room spinning and nausea. Her symptoms came on suddenly and lasted for several minutes and would resolve spontaneously, but reoccur over the next few hours. She admits to a history of Meniere's disease s/p surgery by ENT in the 1980s. She has a chronic history of tinnitus. She denies associated precipitation of symptoms by head movement or position, associated migraine headache, weakness, numbness, difficult gait, dysarthria or dysphagia. She denies any recent viral illness. In the ED, CBC and BMET were within normal limits. Neurologic exam was normal but effusion was noted behind her right tympanic membrane without associated otitis. She was discharged with Meclizine. She reports improvement in her symptoms with only mild vertiginous symptoms occasionally as long as she takes the meclizine. She requests a referral back to ENT.   Past Medical History:  Diagnosis Date  . Allergic rhinitis   . Anxiety   . Atrial fibrillation with RVR ([place])   . Chronic sinusitis   . DEGENERATIVE JOINT DISEASE, HANDS    several images revealing OA  . Depression    treated at [facility]  . Goiter    hx of goiter  . Hx of atrial tachycardia since age 64 03/15/2013  . Palpitations    Associated with difficulty breathing  . Panic attacks   . Poisoning by selective serotonin reuptake inhibitors(969.03) Jan 2007   severe reaction with MAOI (nardil) and dextromethorphan  . Tachycardia     Review of Systems: A complete ROS was negative except as noted in HPI.   Physical Exam: Vitals:   03/07/16 1515  BP: (!) 102/57  Pulse: 86  Temp: 99.1 F (37.3 C)  TempSrc: Oral  SpO2: 97%  Weight:  149 lb 6.4 oz (67.8 kg)  Height: 5' 5.5" (1.664 m)   General: Vital signs reviewed.  Patient is well-developed and well-nourished, in no acute distress and cooperative with exam.  Head: Normocephalic and atraumatic. Eyes: EOMI, conjunctivae normal  Ears: Small effusion behind left tympanic membrane without otitis. Normal right tympanic membrane Neck: Supple, trachea midline.  Cardiovascular: RRR, S1 normal, S2 normal, no murmurs, gallops, or rubs. Pulmonary/Chest: Clear to auscultation bilaterally, no wheezes, rales, or rhonchi. Extremities: No lower extremity edema bilaterally Neurological: A&O x3,cranial nerve II-XII are grossly intact Skin: Warm, dry and intact. No rashes or erythema.  Assessment & Plan:  See encounters tab for problem based medical decision making. Patient discussed with Dr. Lynnae January

## 2016-03-09 NOTE — Progress Notes (Deleted)
Electrophysiology Office Note   Date:  03/09/2016   ID:  Sheri, [name] 1951/06/01, MRN [id number]  PCP:  Julious Oka, MD  Cardiologist:  Debara Pickett Primary Electrophysiologist:  Michie Molnar [name], MD    No chief complaint on file.    History of Present Illness: Sheri [name] is a 65 y.o. female who presents today for electrophysiology evaluation.   She has a history of atrial fibrillation.  Had ablation 10/12/15.   Today, she denies symptoms of palpitations, chest pain, shortness of breath, orthopnea, PND, lower extremity edema, claudication, dizziness, presyncope, syncope, bleeding, or neurologic sequela. The patient is tolerating medications without difficulties and is otherwise without complaint today.    Past Medical History:  Diagnosis Date  . Allergic rhinitis   . Anxiety   . Atrial fibrillation with RVR ([place])   . Chronic sinusitis   . DEGENERATIVE JOINT DISEASE, HANDS    several images revealing OA  . Depression    treated at [facility]  . Goiter    hx of goiter  . Hx of atrial tachycardia since age 52 03/15/2013  . Palpitations    Associated with difficulty breathing  . Panic attacks   . Poisoning by selective serotonin reuptake inhibitors(969.03) Jan 2007   severe reaction with MAOI (nardil) and dextromethorphan  . Tachycardia    Past Surgical History:  Procedure Laterality Date  . APPENDECTOMY    . ELECTROPHYSIOLOGIC STUDY N/A 10/12/2015   Procedure: Atrial Fibrillation Ablation;  Surgeon: Terie Lear [name], MD;  Location: [facility];  Service: Cardiovascular;  Laterality: N/A;  . OVARIAN CYST REMOVAL  march 1969   left ovarian cystectomy  . right mastoidectomy     ET tube placement in 1980 and 1990  . right sinus surgery  october 2000  . THYROIDECTOMY, PARTIAL     nodule removal - April 1996     Current Outpatient Prescriptions  Medication Sig Dispense Refill  . ALPRAZolam (XANAX) 0.5 MG tablet Take 0.5 mg by mouth 2 (two) times  daily.     Marland Kitchen apixaban (ELIQUIS) 5 MG TABS tablet Take 1 tablet (5 mg total) by mouth 2 (two) times daily. (Patient not taking: Reported on 11/19/2015) 60 tablet 11  . gabapentin (NEURONTIN) 300 MG capsule Take 300 mg by mouth 3 (three) times daily.     . meclizine (ANTIVERT) 25 MG tablet Take 1 tablet (25 mg total) by mouth 3 (three) times daily as needed for dizziness. 90 tablet 0  . propranolol (INDERAL) 10 MG tablet Take 1 tablet (10 mg total) by mouth 3 (three) times daily. (Patient taking differently: Take 10 mg by mouth 3 (three) times daily as needed (for heart). ) 270 tablet 1  . QUEtiapine (SEROQUEL) 200 MG tablet Take 100 mg by mouth at bedtime.    . traZODone (DESYREL) 100 MG tablet Take 100 mg by mouth at bedtime.    Marland Kitchen venlafaxine XR (EFFEXOR-XR) 75 MG 24 hr capsule Take 75 mg by mouth 3 (three) times daily.      No current facility-administered medications for this visit.     Allergies:   Guaifenesin   Social History:  The patient  reports that she has never smoked. She has never used smokeless tobacco. She reports that she does not drink alcohol or use drugs.   Family History:  The patient's family history includes Atrial fibrillation in her sister and sister; Hypertension in her father and mother; Psoriasis in her father.  ROS:  Please see the history of present illness.   Otherwise, review of systems is positive for ***.   All other systems are reviewed and negative.    PHYSICAL EXAM: VS:  There were no vitals taken for this visit. , BMI There is no height or weight on file to calculate BMI. GEN: Well nourished, well developed, in no acute distress  HEENT: normal  Neck: no JVD, carotid bruits, or masses Cardiac: ***RRR; no murmurs, rubs, or gallops,no edema  Respiratory:  clear to auscultation bilaterally, normal work of breathing GI: soft, nontender, nondistended, + BS MS: no deformity or atrophy  Skin: warm and dry Neuro:  Strength and sensation are intact Psych:  euthymic mood, full affect  EKG:  EKG is ordered today. Personal review of the ekg ordered today shows ***  Recent Labs: 02/29/2016: BUN 7; Creatinine, Ser 0.71; Hemoglobin 14.9; Platelets 339; Potassium 4.0; Sodium 139    Lipid Panel     Component Value Date/Time   CHOL 111 03/16/2013 0355   TRIG 70 03/16/2013 0355   HDL 39 (L) 03/16/2013 0355   CHOLHDL 2.8 03/16/2013 0355   VLDL 14 03/16/2013 0355   LDLCALC 58 03/16/2013 0355     Wt Readings from Last 3 Encounters:  03/07/16 149 lb 6.4 oz (67.8 kg)  02/29/16 148 lb 3.2 oz (67.2 kg)  11/19/15 151 lb 12.8 oz (68.9 kg)      Other studies Reviewed: Additional studies/ records that were reviewed today include: TTE 2014  Review of the above records today demonstrates:  - Left ventricle: The cavity size was normal. Wall thickness was increased in a pattern of mild LVH. Systolic function was normal. The estimated ejection fraction was in the range of 55% to 60%. Wall motion was normal; there were no regional wall motion abnormalities. Left ventricular diastolic function parameters were normal. - Left atrium: The atrium was normal in size. - Tricuspid valve: Poorly visualized. Trivial regurgitation. - Pulmonary arteries: PA peak pressure: 62mm Hg (S). - Inferior vena cava: The vessel was normal in size; the respirophasic diameter changes were in the normal range  50%); findings are consistent with normal central venous pressure.   ASSESSMENT AND PLAN:  1.  Paroxysmal atrial fibrillation: on Xarelto s/p ablation 10/12/15.  This patients CHA2DS2-VASc Score and unadjusted Ischemic Stroke Rate (% per year) is equal to 2.2 % stroke rate/year from a score of 2  Above score calculated as 1 point each if present [CHF, HTN, DM, Vascular=MI/PAD/Aortic Plaque, Age if 65-74, or Female] Above score calculated as 2 points each if present [Age > 75, or Stroke/TIA/TE]  Current medicines are reviewed at length with the  patient today.   The patient does not have concerns regarding her medicines.  The following changes were made today:  Xarelto, flecainide  Labs/ tests ordered today include:  No orders of the defined types were placed in this encounter.    Disposition:   FU with Sierria Bruney  3 months  Signed, Kavon Valenza [name], MD  03/09/2016 7:54 PM     [address] [place] [address] [phone] (office) [phone] (fax)

## 2016-03-10 ENCOUNTER — Ambulatory Visit: Payer: Medicare HMO | Admitting: Cardiology

## 2016-03-10 NOTE — Progress Notes (Signed)
Internal Medicine Clinic Attending  Case discussed with Dr. Burns soon after the resident saw the patient.  We reviewed the resident's history and exam and pertinent patient test results.  I agree with the assessment, diagnosis, and plan of care documented in the resident's note. 

## 2016-03-25 NOTE — Progress Notes (Signed)
Electrophysiology Office Note   Date:  03/26/2016   ID:  [name], DOB [birth year], MRN [id number]  PCP:  Julious Oka, MD  Cardiologist:  Debara Pickett Primary Electrophysiologist:  Ivonna Kinnick [name], MD    Chief Complaint  Patient presents with  . Follow-up    post afib ablation  . Dizziness     History of Present Illness: Sheri [name] is a 65 y.o. female who presents today for electrophysiology evaluation.   She has a history of atrial fibrillation.    She admitted in 02/2013 for A fib with RVR she was placed on procainamide drip. She then converted to SR. She had echo done which was generally normal. She does report 2 of her sisters with the same disorder - they have both undergone a-fib ablations which have successfully terminated their arrhythmias. She is treated for anxiety and panic attacks with seroquel, trazadone, effexor and xanax.  Today, she denies symptoms of palpitations, chest pain, shortness of breath, orthopnea, PND, lower extremity edema, claudication, dizziness, presyncope, syncope, bleeding, or neurologic sequela. The patient is tolerating medications without difficulties and is otherwise without complaint today. AF ablation 10/12/15. Since that time she has done well and has not noted any further palpitations. She was initially prescribed Eliquis, but has not been taking the medication, as she is not working right now and is having to donate plasma to make money.   Past Medical History:  Diagnosis Date  . Allergic rhinitis   . Anxiety   . Atrial fibrillation with RVR ([place])   . Chronic sinusitis   . DEGENERATIVE JOINT DISEASE, HANDS    several images revealing OA  . Depression    treated at [facility]  . Goiter    hx of goiter  . Hx of atrial tachycardia since age 45 03/15/2013  . Palpitations    Associated with difficulty breathing  . Panic attacks   . Poisoning by selective serotonin reuptake inhibitors(969.03) Jan 2007   severe reaction with MAOI  (nardil) and dextromethorphan  . Tachycardia    Past Surgical History:  Procedure Laterality Date  . APPENDECTOMY    . ELECTROPHYSIOLOGIC STUDY N/A 10/12/2015   Procedure: Atrial Fibrillation Ablation;  Surgeon: Quenna Doepke [name], MD;  Location: [facility];  Service: Cardiovascular;  Laterality: N/A;  . OVARIAN CYST REMOVAL  march 1969   left ovarian cystectomy  . right mastoidectomy     ET tube placement in 1980 and 1990  . right sinus surgery  october 2000  . THYROIDECTOMY, PARTIAL     nodule removal - April 1996     Current Outpatient Prescriptions  Medication Sig Dispense Refill  . ALPRAZolam (XANAX) 0.5 MG tablet Take 0.5 mg by mouth 2 (two) times daily.     Marland Kitchen gabapentin (NEURONTIN) 300 MG capsule Take 300 mg by mouth 3 (three) times daily.     . meclizine (ANTIVERT) 25 MG tablet Take 1 tablet (25 mg total) by mouth 3 (three) times daily as needed for dizziness. 90 tablet 0  . propranolol (INDERAL) 10 MG tablet Take 10 mg by mouth 3 (three) times daily as needed.    Marland Kitchen QUEtiapine (SEROQUEL) 200 MG tablet Take 100 mg by mouth at bedtime.    . traZODone (DESYREL) 100 MG tablet Take 100 mg by mouth at bedtime.    Marland Kitchen venlafaxine XR (EFFEXOR-XR) 75 MG 24 hr capsule Take 75 mg by mouth 3 (three) times daily.      No current  facility-administered medications for this visit.     Allergies:   Guaifenesin   Social History:  The patient  reports that she has never smoked. She has never used smokeless tobacco. She reports that she does not drink alcohol or use drugs.   Family History:  The patient's family history includes Atrial fibrillation in her sister and sister; Hypertension in her father and mother; Psoriasis in her father.    ROS:  Please see the history of present illness.   Otherwise, review of systems is positive for weight change, DOE, depression, anxiety, dizziness.   All other systems are reviewed and negative.    PHYSICAL EXAM: VS:  BP 94/68   Pulse 89   Ht  5' 5.5" (1.664 m)   Wt 149 lb 12.8 oz (67.9 kg)   BMI 24.55 kg/m  , BMI Body mass index is 24.55 kg/m. GEN: Well nourished, well developed, in no acute distress  HEENT: normal  Neck: no JVD, carotid bruits, or masses Cardiac: RRR; no murmurs, rubs, or gallops,no edema  Respiratory:  clear to auscultation bilaterally, normal work of breathing GI: soft, nontender, nondistended, + BS MS: no deformity or atrophy  Skin: warm and dry Neuro:  Strength and sensation are intact Psych: euthymic mood, full affect  EKG:  EKG is ordered today. Personal review of the ekg ordered today shows sinus rhythm, rate 89 Recent Labs: 02/29/2016: BUN 7; Creatinine, Ser 0.71; Hemoglobin 14.9; Platelets 339; Potassium 4.0; Sodium 139    Lipid Panel     Component Value Date/Time   CHOL 111 03/16/2013 0355   TRIG 70 03/16/2013 0355   HDL 39 (L) 03/16/2013 0355   CHOLHDL 2.8 03/16/2013 0355   VLDL 14 03/16/2013 0355   LDLCALC 58 03/16/2013 0355     Wt Readings from Last 3 Encounters:  03/26/16 149 lb 12.8 oz (67.9 kg)  03/07/16 149 lb 6.4 oz (67.8 kg)  02/29/16 148 lb 3.2 oz (67.2 kg)      Other studies Reviewed: Additional studies/ records that were reviewed today include: TTE 2014  Review of the above records today demonstrates:  - Left ventricle: The cavity size was normal. Wall thickness was increased in a pattern of mild LVH. Systolic function was normal. The estimated ejection fraction was in the range of 55% to 60%. Wall motion was normal; there were no regional wall motion abnormalities. Left ventricular diastolic function parameters were normal. - Left atrium: The atrium was normal in size. - Tricuspid valve: Poorly visualized. Trivial regurgitation. - Pulmonary arteries: PA peak pressure: 66mm Hg (S). - Inferior vena cava: The vessel was normal in size; the respirophasic diameter changes were in the normal range  50%); findings are consistent with normal central  venous pressure.   ASSESSMENT AND PLAN:  1.  Paroxysmal atrial fibrillation: Has not yet been on antiarrhythmics.  Unfortunately her medications for depression have significant reactions with her possibility is an antiarrhythmic medications. Af ablation 10/12/15. She is currently not taking any anticoagulation. She does say that the Eliquis made her feel funny when she was on it.. She is currently unemployed and is having to donate plasma to make any kind of money and is unable to take any anticoagulant due to that. She does say that she Hollye Pritt potentially have a job Week, and I have discussed with her the possibility of starting results that time, which she has agreed to do. We Deshan Hemmelgarn call her next week.  This patients CHA2DS2-VASc Score and unadjusted Ischemic Stroke Rate (%  per year) is equal to 2.2 % stroke rate/year from a score of 2  Above score calculated as 1 point each if present [CHF, HTN, DM, Vascular=MI/PAD/Aortic Plaque, Age if 65-74, or Female] Above score calculated as 2 points each if present [Age > 75, or Stroke/TIA/TE]  Current medicines are reviewed at length with the patient today.   The patient does not have concerns regarding her medicines.  The following changes were made today:  Xarelto  Labs/ tests ordered today include:  No orders of the defined types were placed in this encounter.    Disposition:   FU with Angelika Jerrett  6 months  Signed, Koralynn Greenspan [name], MD  03/26/2016 10:37 AM     [facility] [address] [place] [address] [phone] (office) [phone] (fax)

## 2016-03-26 ENCOUNTER — Encounter: Payer: Self-pay | Admitting: Cardiology

## 2016-03-26 ENCOUNTER — Ambulatory Visit (INDEPENDENT_AMBULATORY_CARE_PROVIDER_SITE_OTHER): Payer: Medicare HMO | Admitting: Cardiology

## 2016-03-26 VITALS — BP 94/68 | HR 89 | Ht 65.5 in | Wt 149.8 lb

## 2016-03-26 DIAGNOSIS — I480 Paroxysmal atrial fibrillation: Secondary | ICD-10-CM

## 2016-03-26 NOTE — Patient Instructions (Signed)
Medication Instructions:    Your physician recommends that you continue on your current medications as directed. Please refer to the Current Medication list given to you today.  --- If you need a refill on your cardiac medications before your next appointment, please call your pharmacy. ---  Labwork:  None ordered  Testing/Procedures:  None ordered  Follow-Up:  Your physician wants you to follow-up in: 6 months with Dr. [name].  You will receive a reminder letter in the mail two months in advance. If you don't receive a letter, please call our office to schedule the follow-up appointment.   Any Other Special Instructions Will Be Listed Below (If Applicable).  Aneri Slagel, RN will call you next week to check on employment/anticoagulation.   Thank you for choosing [facility]!!   [name], RN [phone]

## 2016-04-04 ENCOUNTER — Other Ambulatory Visit: Payer: Self-pay | Admitting: Internal Medicine

## 2016-04-04 DIAGNOSIS — H8109 Meniere's disease, unspecified ear: Secondary | ICD-10-CM

## 2016-04-04 NOTE — Telephone Encounter (Addendum)
Call from patient stating she will not have enough meclizine to get her through the weekend. Pt very worried about running out of this medication as she "has to take it everyday".  Medication avail otc-will authorize one refill and she can obtain additional refills at upcoming visit with pcp.Despina Hidden Cassady9/15/20174:58 PM

## 2016-04-11 ENCOUNTER — Telehealth: Payer: Self-pay | Admitting: Internal Medicine

## 2016-04-11 NOTE — Telephone Encounter (Signed)
AT. REMINDER CALL, LMTCB °

## 2016-04-14 ENCOUNTER — Encounter: Payer: Self-pay | Admitting: Internal Medicine

## 2016-04-14 ENCOUNTER — Ambulatory Visit (INDEPENDENT_AMBULATORY_CARE_PROVIDER_SITE_OTHER): Payer: Medicare HMO | Admitting: Internal Medicine

## 2016-04-14 VITALS — BP 102/65 | HR 94 | Temp 98.5°F | Wt 149.0 lb

## 2016-04-14 DIAGNOSIS — N898 Other specified noninflammatory disorders of vagina: Secondary | ICD-10-CM | POA: Diagnosis not present

## 2016-04-14 DIAGNOSIS — I480 Paroxysmal atrial fibrillation: Secondary | ICD-10-CM

## 2016-04-14 DIAGNOSIS — I4891 Unspecified atrial fibrillation: Secondary | ICD-10-CM | POA: Diagnosis not present

## 2016-04-14 DIAGNOSIS — Z23 Encounter for immunization: Secondary | ICD-10-CM | POA: Diagnosis not present

## 2016-04-14 DIAGNOSIS — N760 Acute vaginitis: Secondary | ICD-10-CM

## 2016-04-14 DIAGNOSIS — Z0000 Encounter for general adult medical examination without abnormal findings: Secondary | ICD-10-CM

## 2016-04-16 NOTE — Progress Notes (Signed)
   CC: immunizations, vagina d/c  HPI:  [name] is a 65 y.o. with PMHx as outlined below who presents to clinic requesting pneumovax, flu shot, and gyn referral. She has been having increased vaginal d/c that is white w/o foul odor. She follows w/ Dr. [name] for her afib and had an ablation earlier this year. She is suppose to be on eliquis but cannot afford it as she does not have a job, her cardiologist is aware of this. She is still on antivert until she seees Dr. Clotilde Dieter with ENT for Meniere's disease.   Past Medical History:  Diagnosis Date  . Allergic rhinitis   . Anxiety   . Atrial fibrillation with RVR ([place])   . Chronic sinusitis   . DEGENERATIVE JOINT DISEASE, HANDS    several images revealing OA  . Depression    treated at [facility]  . Goiter    hx of goiter  . Hx of atrial tachycardia since age 39 03/15/2013  . Palpitations    Associated with difficulty breathing  . Panic attacks   . Poisoning by selective serotonin reuptake inhibitors(969.03) Jan 2007   severe reaction with MAOI (nardil) and dextromethorphan  . Tachycardia     Review of Systems:  Denies vaginal itching, fevers, NS, chills, abdominal pain, dysuria. Denies chest pain. Her mood is stable.    Physical Exam:  Vitals:   04/14/16 1539  BP: 102/65  Pulse: 94  Temp: 98.5 F (36.9 C)  TempSrc: Oral  SpO2: 95%  Weight: 149 lb (67.6 kg)   General: NAD, elderly female Lungs: CTAB, no wheezing or crackles Cardiac: RRR, no murmurs, rubs, or gallops GI: soft, active bowel sounds, non TTP Neuro: CN II-XII grossly intact Skin: warm and dry    Assessment & Plan:   See Encounters Tab for problem based charting.  Patient discussed with Dr. Angelia Mould

## 2016-04-16 NOTE — Assessment & Plan Note (Addendum)
Received flu shot and prevnar 13 vaccine today. Referral placed for mammogram.

## 2016-04-16 NOTE — Progress Notes (Signed)
Internal Medicine Clinic Attending  Case discussed with Dr. Truong at the time of the visit.  We reviewed the resident's history and exam and pertinent patient test results.  I agree with the assessment, diagnosis, and plan of care documented in the resident's note.  

## 2016-04-16 NOTE — Assessment & Plan Note (Signed)
Pt is currently in RRR. Her Chads-vasc score is 2 and she should be on AC. Message was sent to SW and pharmacy for medication assistance for eliquis.

## 2016-04-16 NOTE — Assessment & Plan Note (Addendum)
Pt is refusing a pelvic exam today and would like to have a referral placed for ob/gyn. She had a neg pap smear in 2016. She does not have any alarm symptoms - fevers, NS, chills, abd pain, dysuria, etc that would warrant immediate tx. Will place referral for ob/gyn.

## 2016-04-17 ENCOUNTER — Telehealth: Payer: Self-pay | Admitting: *Deleted

## 2016-04-17 NOTE — Telephone Encounter (Signed)
Attempted to reach patient over the last several weeks with no success.  Today's attempt - phone number no longer in service/disconnected.  (needing to follow up with patient on anticoagulation compliance.  She was given samples at last OV.  At that time she reported that she was hopeful of beginning employment soon. Will inform Dr. [name] that I have been unable to reach patient)

## 2016-04-22 ENCOUNTER — Telehealth: Payer: Self-pay | Admitting: *Deleted

## 2016-05-06 ENCOUNTER — Other Ambulatory Visit (HOSPITAL_COMMUNITY): Payer: Self-pay | Admitting: Otolaryngology

## 2016-05-06 DIAGNOSIS — IMO0001 Reserved for inherently not codable concepts without codable children: Secondary | ICD-10-CM

## 2016-05-06 DIAGNOSIS — H9313 Tinnitus, bilateral: Secondary | ICD-10-CM

## 2016-05-12 ENCOUNTER — Ambulatory Visit (HOSPITAL_COMMUNITY): Payer: Medicare HMO

## 2016-05-12 ENCOUNTER — Other Ambulatory Visit: Payer: Self-pay | Admitting: Internal Medicine

## 2016-05-12 DIAGNOSIS — H8109 Meniere's disease, unspecified ear: Secondary | ICD-10-CM

## 2016-05-14 ENCOUNTER — Ambulatory Visit (HOSPITAL_COMMUNITY): Admission: RE | Admit: 2016-05-14 | Payer: Medicare HMO | Source: Ambulatory Visit

## 2016-05-15 ENCOUNTER — Ambulatory Visit (HOSPITAL_COMMUNITY): Payer: Medicare HMO

## 2016-05-19 ENCOUNTER — Ambulatory Visit: Payer: Self-pay | Admitting: Gynecology

## 2016-05-21 ENCOUNTER — Telehealth: Payer: Self-pay | Admitting: Licensed Clinical Social Worker

## 2016-05-21 ENCOUNTER — Ambulatory Visit (HOSPITAL_COMMUNITY)
Admission: RE | Admit: 2016-05-21 | Discharge: 2016-05-21 | Disposition: A | Discharge status: Home / Self Care | Payer: Medicare HMO | Source: Ambulatory Visit | Attending: Otolaryngology | Admitting: Otolaryngology

## 2016-05-21 DIAGNOSIS — H8143 Vertigo of central origin, bilateral: Secondary | ICD-10-CM | POA: Insufficient documentation

## 2016-05-21 DIAGNOSIS — H748X3 Other specified disorders of middle ear and mastoid, bilateral: Secondary | ICD-10-CM | POA: Insufficient documentation

## 2016-05-21 DIAGNOSIS — H9313 Tinnitus, bilateral: Secondary | ICD-10-CM | POA: Insufficient documentation

## 2016-05-21 DIAGNOSIS — IMO0001 Reserved for inherently not codable concepts without codable children: Secondary | ICD-10-CM

## 2016-05-21 LAB — POCT I-STAT CREATININE: Creatinine, Ser: 0.7 mg/dL (ref 0.44–1.00)

## 2016-05-21 MED ORDER — GADOBENATE DIMEGLUMINE 529 MG/ML IV SOLN
14.0000 mL | Freq: Once | INTRAVENOUS | Status: AC | PRN
  Administered 2016-05-21: 14 mL via INTRAVENOUS

## 2016-05-21 NOTE — Telephone Encounter (Signed)
CSW returned call to Ms. Harner.  Pt states she is currently receiving Vocational Rehabilitation services with [facility] and is need of dental work to assist with her employment.  Pt states [employer] will assist with dental work if pt dental "condition endanger her health".  Pt states she has not discussed this with her PCP in the past, but is in need of the letter before next month as they are holding the job for her. CSW informed Ms. Deavers this note will be forwarded to PCP with request.

## 2016-05-22 NOTE — Telephone Encounter (Signed)
Can she make an appt with me November 30th to evaluate and write her a letter. Thanks.    Julious Oka, MD Internal Medicine Resident, PGY [facility] Internal Medicine Program Pager: [phone]

## 2016-06-03 ENCOUNTER — Ambulatory Visit: Payer: Self-pay | Admitting: Gynecology

## 2016-06-03 DIAGNOSIS — Z0289 Encounter for other administrative examinations: Secondary | ICD-10-CM

## 2016-07-25 ENCOUNTER — Encounter (HOSPITAL_COMMUNITY): Payer: Self-pay | Admitting: Emergency Medicine

## 2016-07-25 ENCOUNTER — Ambulatory Visit (HOSPITAL_COMMUNITY)
Admission: EM | Admit: 2016-07-25 | Discharge: 2016-07-25 | Disposition: A | Discharge status: Home / Self Care | Payer: Medicare HMO | Attending: Family Medicine | Admitting: Family Medicine

## 2016-07-25 DIAGNOSIS — J0140 Acute pansinusitis, unspecified: Secondary | ICD-10-CM | POA: Diagnosis not present

## 2016-07-25 MED ORDER — DOXYCYCLINE HYCLATE 100 MG PO CAPS: 100.0000 mg | ORAL_CAPSULE | Freq: Two times a day (BID) | ORAL | 0 refills | Status: AC

## 2016-07-25 MED ORDER — ALBUTEROL SULFATE HFA 108 (90 BASE) MCG/ACT IN AERS: 1.0000 | INHALATION_SPRAY | Freq: Four times a day (QID) | RESPIRATORY_TRACT | 0 refills | Status: DC | PRN

## 2016-07-25 MED ORDER — PREDNISONE 10 MG (21) PO TBPK: ORAL_TABLET | ORAL | 0 refills | Status: DC

## 2016-07-25 MED ORDER — BENZONATATE 100 MG PO CAPS: 100.0000 mg | ORAL_CAPSULE | Freq: Three times a day (TID) | ORAL | 0 refills | Status: DC

## 2016-07-25 NOTE — Discharge Instructions (Signed)
Be sure to complete all your antibiotics. You may take Claritin over the counter for congestion. Drink plenty of water and rest. Should your symptoms fail to improve or worsen follow up with your primary care provider or return to clinic. For cough you have been given Tessalon, take 3 times a day. For your wheezing you have been given albuterol and prednisone.

## 2016-07-25 NOTE — ED Triage Notes (Signed)
Here for cold sx onset 8 days associated w/nasal congestion/drainage, chest congestion, wheezing, prod cough, SOB  Denies fevers  A&O x4... NAD

## 2016-07-25 NOTE — ED Provider Notes (Signed)
CSN: [id number]     Arrival date & time 07/25/16  1716 History   First MD Initiated Contact with Patient 07/25/16 1843     Chief Complaint  Patient presents with  . URI   (Consider location/radiation/quality/duration/timing/severity/associated sxs/prior Treatment) 66 year old female presents to clinic with 8-10 day history of sinus tenderness and pain along with cough. Patient reports she initially had cold like symptoms of congestion and sore throat, her sore throat has resolved but congestion has worsened along with developing a cough. States her cough is worse at night, dry, hacking, non-productive, sputum from her nose is green.   The history is provided by the patient.  URI  Presenting symptoms: congestion and rhinorrhea   Presenting symptoms: no ear pain and no sore throat   Associated symptoms: sinus pain and wheezing     Past Medical History:  Diagnosis Date  . Allergic rhinitis   . Anxiety   . Atrial fibrillation with RVR ([place])   . Chronic sinusitis   . DEGENERATIVE JOINT DISEASE, HANDS    several images revealing OA  . Depression    treated at [facility]  . Goiter    hx of goiter  . Hx of atrial tachycardia since age 58 03/15/2013  . Palpitations    Associated with difficulty breathing  . Panic attacks   . Poisoning by selective serotonin reuptake inhibitors(969.03) Jan 2007   severe reaction with MAOI (nardil) and dextromethorphan  . Tachycardia    Past Surgical History:  Procedure Laterality Date  . APPENDECTOMY    . ELECTROPHYSIOLOGIC STUDY N/A 10/12/2015   Procedure: Atrial Fibrillation Ablation;  Surgeon: [name], MD;  Location: [facility];  Service: Cardiovascular;  Laterality: N/A;  . OVARIAN CYST REMOVAL  march 1969   left ovarian cystectomy  . right mastoidectomy     ET tube placement in 1980 and 1990  . right sinus surgery  october 2000  . THYROIDECTOMY, PARTIAL     nodule removal - April 1996   Family History  Problem  Relation Age of Onset  . Hypertension Mother   . Hypertension Father   . Psoriasis Father   . Atrial fibrillation Sister     had a successful a fib ablation  . Atrial fibrillation Sister     had a successful a fib ablation   Social History  Substance Use Topics  . Smoking status: Never Smoker  . Smokeless tobacco: Never Used  . Alcohol use No   OB History    Gravida Para Term Preterm AB Living   4 3 3   1 3    SAB TAB Ectopic Multiple Live Births   1             Review of Systems  Constitutional: Negative.   HENT: Positive for congestion, rhinorrhea, sinus pain and sinus pressure. Negative for ear discharge, ear pain, postnasal drip and sore throat.   Eyes: Negative.   Respiratory: Positive for shortness of breath and wheezing. Negative for chest tightness.   Cardiovascular: Negative.   Gastrointestinal: Negative for abdominal pain, diarrhea, nausea and vomiting.  Genitourinary: Negative.   Musculoskeletal: Negative.   Neurological: Negative.     Allergies  Guaifenesin  Home Medications   Prior to Admission medications   Medication Sig Start Date End Date Taking? Authorizing Provider  ALPRAZolam Duanne Moron) 0.5 MG tablet Take 0.5 mg by mouth 2 (two) times daily.    Yes Historical Provider, MD  gabapentin (NEURONTIN) 300 MG  capsule Take 300 mg by mouth 3 (three) times daily.    Yes Historical Provider, MD  meclizine (ANTIVERT) 25 MG tablet TAKE ONE TABLET BY MOUTH THREE TIMES DAILY AS NEEDED FOR DIZZINESS 05/13/16  Yes Norman Herrlich, MD  propranolol (INDERAL) 10 MG tablet Take 10 mg by mouth 3 (three) times daily as needed.   Yes Historical Provider, MD  QUEtiapine (SEROQUEL) 200 MG tablet Take 100 mg by mouth at bedtime.   Yes Historical Provider, MD  traZODone (DESYREL) 100 MG tablet Take 100 mg by mouth at bedtime.   Yes Historical Provider, MD  venlafaxine XR (EFFEXOR-XR) 75 MG 24 hr capsule Take 75 mg by mouth 3 (three) times daily.    Yes Historical Provider, MD   albuterol (PROVENTIL HFA;VENTOLIN HFA) 108 (90 Base) MCG/ACT inhaler Inhale 1-2 puffs into the lungs every 6 (six) hours as needed for wheezing or shortness of breath. 07/25/16   Barnet Glasgow, NP  benzonatate (TESSALON) 100 MG capsule Take 1 capsule (100 mg total) by mouth every 8 (eight) hours. 07/25/16   Barnet Glasgow, NP  doxycycline (VIBRAMYCIN) 100 MG capsule Take 1 capsule (100 mg total) by mouth 2 (two) times daily. 07/25/16 08/01/16  Barnet Glasgow, NP  predniSONE (STERAPRED UNI-PAK 21 TAB) 10 MG (21) TBPK tablet Take 6 tablets in the morning then decrease by 1 each day till finished [id number]) 07/25/16   Barnet Glasgow, NP   Meds Ordered and Administered this Visit  Medications - No data to display  BP 101/58 (BP Location: Left Arm)   Pulse 79   Temp 98.2 F (36.8 C) (Oral)   Resp 16   SpO2 97%  No data found.   Physical Exam  Constitutional: She is oriented to person, place, and time. She appears well-developed and well-nourished. No distress.  HENT:  Head: Normocephalic.  Right Ear: Tympanic membrane and external ear normal.  Left Ear: Tympanic membrane and external ear normal.  Nose: Right sinus exhibits maxillary sinus tenderness and frontal sinus tenderness. Left sinus exhibits maxillary sinus tenderness and frontal sinus tenderness.  Mouth/Throat: Oropharynx is clear and moist. No oropharyngeal exudate or posterior oropharyngeal edema.  Eyes: Pupils are equal, round, and reactive to light.  Neck: Normal range of motion. Neck supple. No JVD present.  Cardiovascular: Normal rate and regular rhythm.   Pulmonary/Chest: Effort normal and breath sounds normal. No respiratory distress. She has no wheezes.  Abdominal: Soft. Bowel sounds are normal.  Lymphadenopathy:    She has no cervical adenopathy.  Neurological: She is alert and oriented to person, place, and time.  Skin: Skin is warm and dry. Capillary refill takes less than 2 seconds. She is not diaphoretic. No  erythema. No pallor.  Psychiatric: She has a normal mood and affect.  Nursing note and vitals reviewed.   Urgent Care Course   Clinical Course     Procedures (including critical care time)  Labs Review Labs Reviewed - No data to display  Imaging Review No results found.   Visual Acuity Review  Right Eye Distance:   Left Eye Distance:   Bilateral Distance:    Right Eye Near:   Left Eye Near:    Bilateral Near:         MDM   1. Acute non-recurrent pansinusitis    Patient given doxycycline for sinusitis and tessalon for cough. Patient given medrol dose pack and albuterol inhaler for report or wheezing and shortness of breath. Patient advised to follow up with her PCP should  symptoms fail to improve or return to clinic.     Barnet Glasgow, NP 07/25/16 [phone]

## 2016-10-14 ENCOUNTER — Other Ambulatory Visit: Payer: Self-pay | Admitting: Cardiology

## 2016-10-30 ENCOUNTER — Ambulatory Visit: Payer: Medicare HMO

## 2016-11-03 ENCOUNTER — Ambulatory Visit: Payer: Medicare HMO

## 2016-11-05 ENCOUNTER — Encounter: Payer: Self-pay | Admitting: Internal Medicine

## 2016-11-05 ENCOUNTER — Encounter (INDEPENDENT_AMBULATORY_CARE_PROVIDER_SITE_OTHER): Payer: Self-pay

## 2016-11-05 ENCOUNTER — Ambulatory Visit (INDEPENDENT_AMBULATORY_CARE_PROVIDER_SITE_OTHER): Payer: Medicare HMO | Admitting: Internal Medicine

## 2016-11-05 DIAGNOSIS — Z8249 Family history of ischemic heart disease and other diseases of the circulatory system: Secondary | ICD-10-CM

## 2016-11-05 DIAGNOSIS — I4891 Unspecified atrial fibrillation: Secondary | ICD-10-CM | POA: Diagnosis not present

## 2016-11-05 DIAGNOSIS — Z005 Encounter for examination of potential donor of organ and tissue: Secondary | ICD-10-CM

## 2016-11-05 DIAGNOSIS — Z52008 Unspecified donor, other blood: Secondary | ICD-10-CM

## 2016-11-05 DIAGNOSIS — I480 Paroxysmal atrial fibrillation: Secondary | ICD-10-CM

## 2016-11-05 NOTE — Assessment & Plan Note (Addendum)
Stable. She is s/p ablation. Discussed the risks and benefits of anticoagulation with the patient, including the risk of stroke. She states she does not wish to be on [facility] at this time. Will have her continue propranolol as needed for palpitations.

## 2016-11-05 NOTE — Assessment & Plan Note (Signed)
Patient has been donating plasma for the last 5 years without any issues. Her only significant medical condition is atrial fibrillation and this has been stable since her ablation in March 2017. She is medically cleared to continue donating plasma. The paperwork that she brought was filled out appropriately.

## 2016-11-05 NOTE — Patient Instructions (Signed)
General Instructions: - Cleared to donate plasma - Would recommend making an appointment with your primary care doctor for follow up of your other medical conditions   Please bring your medicines with you each time you come to clinic.  Medicines may include prescription medications, over-the-counter medications, herbal remedies, eye drops, vitamins, or other pills.   Progress Toward Treatment Goals:  No flowsheet data found.  Self Care Goals & Plans:  Self Care Goal 09/08/2012  Manage my medications take my medicines as prescribed; refill my medications on time; bring my medications to every visit    No flowsheet data found.   Care Management & Community Referrals:  Referral 09/08/2012  Referrals made for care management support financial counselor

## 2016-11-05 NOTE — Progress Notes (Signed)
   CC: Evaluation for donating plasma  HPI:  [name] is a 66 y.o. woman with past medical history as noted below who presents today for evaluation for donating plasma.  Patient reports she has been donating plasma for the last 5 years. However, now that she is older than 61 the facility would like for her to have medical clearance for this procedure. She denies any issues with donating plasma. She denies any dizziness after the procedure.  She does have a history of atrial fibrillation. She had an ablation in March 2017 without any recurrent episodes of atrial fibrillation to her knowledge. She was advised to be on anticoagulation and was placed on Xarelto (had funny feeling from Eliquis) by her cardiologist. However, patient does not wish to be on [facility] and has been taking Xarelto. She reports she has 2 sisters that have Afib also and they are doing well while not on AC.  Past Medical History:  Diagnosis Date  . Allergic rhinitis   . Anxiety   . Atrial fibrillation with RVR ([place])   . Chronic sinusitis   . DEGENERATIVE JOINT DISEASE, HANDS    several images revealing OA  . Depression    treated at [facility]  . Goiter    hx of goiter  . Hx of atrial tachycardia since age 89 03/15/2013  . Palpitations    Associated with difficulty breathing  . Panic attacks   . Poisoning by selective serotonin reuptake inhibitors(969.03) Jan 2007   severe reaction with MAOI (nardil) and dextromethorphan  . Tachycardia     Review of Systems:   General: Denies fever, chills, night sweats, changes in weight, changes in appetite HEENT: Denies headaches, ear pain, changes in vision, rhinorrhea, sore throat CV: Denies CP, palpitations, SOB, orthopnea Pulm: Denies SOB, cough, wheezing GI: Denies abdominal pain, nausea, vomiting, diarrhea, constipation, melena, hematochezia GU: Denies dysuria, hematuria, frequency Msk: Denies muscle cramps, joint pains Neuro: Denies weakness, numbness,  tingling Skin: Denies rashes, bruising Psych: Denies depression, anxiety, hallucinations  Physical Exam:  Vitals:   11/05/16 1004  BP: 109/66  Pulse: 95  Temp: 98.1 F (36.7 C)  TempSrc: Oral  SpO2: 98%  Weight: 155 lb 14.4 oz (70.7 kg)   General: Thin elderly woman in NAD CV: RRR, no m/g/r Pulm: CTA bilaterally, breaths non-labored  Assessment & Plan:   See Encounters Tab for problem based charting.  Patient discussed with Dr. Lynnae January

## 2016-11-06 NOTE — Progress Notes (Signed)
Internal Medicine Clinic Attending  Case discussed with Dr. Rivet soon after the resident saw the patient.  We reviewed the resident's history and exam and pertinent patient test results.  I agree with the assessment, diagnosis, and plan of care documented in the resident's note.  

## 2016-11-11 ENCOUNTER — Telehealth: Payer: Self-pay | Admitting: Internal Medicine

## 2016-11-11 NOTE — Telephone Encounter (Signed)
APT. REMINDER CALL, PHONE NOT IN SERVICE °

## 2016-11-12 ENCOUNTER — Ambulatory Visit: Payer: Medicare HMO | Admitting: Internal Medicine

## 2016-11-19 ENCOUNTER — Ambulatory Visit: Payer: Medicare HMO | Admitting: Internal Medicine

## 2016-12-08 ENCOUNTER — Ambulatory Visit: Payer: Medicare HMO | Admitting: Cardiology

## 2016-12-10 ENCOUNTER — Ambulatory Visit: Payer: Medicare HMO | Admitting: Internal Medicine

## 2016-12-30 ENCOUNTER — Ambulatory Visit: Payer: Medicare HMO | Admitting: Cardiology

## 2016-12-30 NOTE — Progress Notes (Deleted)
Electrophysiology Office Note   Date:  12/30/2016   ID:  Sheri [name], DOB April 06, 1951, MRN [id number]  PCP:  Sheri Falcon, MD  Cardiologist:  Sheri [name] Primary Electrophysiologist:  Sheri Lerew [name], MD    No chief complaint on file.    History of Present Illness: Sheri [name] is a 66 y.o. female who presents today for electrophysiology evaluation.   She has a history of atrial fibrillation.    She admitted in 02/2013 for A fib with RVR she was placed on procainamide drip. She then converted to SR. She had echo done which was generally normal. AF ablation 10/12/15.  Today, denies symptoms of palpitations, chest pain, shortness of breath, orthopnea, PND, lower extremity edema, claudication, dizziness, presyncope, syncope, bleeding, or neurologic sequela. The patient is tolerating medications without difficulties and is otherwise without complaint today. ***   Past Medical History:  Diagnosis Date  . Allergic rhinitis   . Anxiety   . Atrial fibrillation with RVR ([place])   . Chronic sinusitis   . DEGENERATIVE JOINT DISEASE, HANDS    several images revealing OA  . Depression    treated at [facility]  . Goiter    hx of goiter  . Hx of atrial tachycardia since age 46 03/15/2013  . Palpitations    Associated with difficulty breathing  . Panic attacks   . Poisoning by selective serotonin reuptake inhibitors(969.03) Jan 2007   severe reaction with MAOI (nardil) and dextromethorphan  . Tachycardia    Past Surgical History:  Procedure Laterality Date  . APPENDECTOMY    . ELECTROPHYSIOLOGIC STUDY N/A 10/12/2015   Procedure: Atrial Fibrillation Ablation;  Surgeon: Sheri Cambridge [name], MD;  Location: [place] [facility];  Service: Cardiovascular;  Laterality: N/A;  . OVARIAN CYST REMOVAL  march 1969   left ovarian cystectomy  . right mastoidectomy     ET tube placement in 1980 and 1990  . right sinus surgery  october 2000  . THYROIDECTOMY, PARTIAL     nodule removal - April  1996     Current Outpatient Prescriptions  Medication Sig Dispense Refill  . albuterol (PROVENTIL HFA;VENTOLIN HFA) 108 (90 Base) MCG/ACT inhaler Inhale 1-2 puffs into the lungs every 6 (six) hours as needed for wheezing or shortness of breath. 1 Inhaler 0  . ALPRAZolam (XANAX) 0.5 MG tablet Take 0.5 mg by mouth 2 (two) times daily.     . benzonatate (TESSALON) 100 MG capsule Take 1 capsule (100 mg total) by mouth every 8 (eight) hours. 21 capsule 0  . gabapentin (NEURONTIN) 300 MG capsule Take 300 mg by mouth 3 (three) times daily.     . meclizine (ANTIVERT) 25 MG tablet TAKE ONE TABLET BY MOUTH THREE TIMES DAILY AS NEEDED FOR DIZZINESS 90 tablet 0  . predniSONE (STERAPRED UNI-PAK 21 TAB) 10 MG (21) TBPK tablet Take 6 tablets in the morning then decrease by 1 each day till finished (6,5,4,3,2,1) 21 tablet 0  . propranolol (INDERAL) 10 MG tablet Take 10 mg by mouth 3 (three) times daily as needed.    . propranolol (INDERAL) 10 MG tablet Take 1 tablet (10 mg total) by mouth 3 (three) times daily as needed. 270 tablet 1  . QUEtiapine (SEROQUEL) 200 MG tablet Take 100 mg by mouth at bedtime.    . traZODone (DESYREL) 100 MG tablet Take 100 mg by mouth at bedtime.    Marland Kitchen venlafaxine XR (EFFEXOR-XR) 75 MG 24 hr capsule Take 75 mg by mouth  3 (three) times daily.      No current facility-administered medications for this visit.     Allergies:   Guaifenesin   Social History:  The patient  reports that she has never smoked. She has never used smokeless tobacco. She reports that she does not drink alcohol or use drugs.   Family History:  The patient's family history includes Atrial fibrillation in her sister and sister; Hypertension in her father and mother; Psoriasis in her father.   ROS:  Please see the history of present illness.   Otherwise, review of systems is positive for ***.   All other systems are reviewed and negative.     PHYSICAL EXAM: VS:  There were no vitals taken for this visit. ,  BMI There is no height or weight on file to calculate BMI. GEN: Well nourished, well developed, in no acute distress  HEENT: normal  Neck: no JVD, carotid bruits, or masses Cardiac: ***RRR; no murmurs, rubs, or gallops,no edema  Respiratory:  clear to auscultation bilaterally, normal work of breathing GI: soft, nontender, nondistended, + BS MS: no deformity or atrophy  Skin: warm and dry, ***device site well healed Neuro:  Strength and sensation are intact Psych: euthymic mood, full affect  EKG:  EKG {ACTION; IS/IS [id number]} ordered today. Personal review of the ekg ordered *** shows ***  Recent Labs: 02/29/2016: BUN 7; Hemoglobin 14.9; Platelets 339; Potassium 4.0; Sodium 139 05/21/2016: Creatinine, Ser 0.70    Lipid Panel     Component Value Date/Time   CHOL 111 03/16/2013 0355   TRIG 70 03/16/2013 0355   HDL 39 (L) 03/16/2013 0355   CHOLHDL 2.8 03/16/2013 0355   VLDL 14 03/16/2013 0355   LDLCALC 58 03/16/2013 0355     Wt Readings from Last 3 Encounters:  11/05/16 155 lb 14.4 oz (70.7 kg)  04/14/16 149 lb (67.6 kg)  03/26/16 149 lb 12.8 oz (67.9 kg)      Other studies Reviewed: Additional studies/ records that were reviewed today include: TTE 2014  Review of the above records today demonstrates:  - Left ventricle: The cavity size was normal. Wall thickness was increased in a pattern of mild LVH. Systolic function was normal. The estimated ejection fraction was in the range of 55% to 60%. Wall motion was normal; there were no regional wall motion abnormalities. Left ventricular diastolic function parameters were normal. - Left atrium: The atrium was normal in size. - Tricuspid valve: Poorly visualized. Trivial regurgitation. - Pulmonary arteries: PA peak pressure: 2mm Hg (S). - Inferior vena cava: The vessel was normal in size; the respirophasic diameter changes were in the normal range  50%); findings are consistent with normal central  venous pressure.   ASSESSMENT AND PLAN:  1.  Paroxysmal atrial fibrillation: Has not yet been on antiarrhythmics.  Unfortunately her medications for depression have significant reactions with her possibility is an antiarrhythmic medications. Af ablation 10/12/15. She is currently not taking any anticoagulation. She does say that the Eliquis made her feel funny when she was on it.. She is currently unemployed and is having to donate plasma to make any kind of money and is unable to take any anticoagulant due to that. She does say that she [place] potentially have a job Week, and I have discussed with her the possibility of starting results that time, which she has agreed to do. We Lyn Deemer call her next week.***  This patients CHA2DS2-VASc Score and unadjusted Ischemic Stroke Rate (% per year) is equal to  2.2 % stroke rate/year from a score of 2  Above score calculated as 1 point each if present [CHF, HTN, DM, Vascular=MI/PAD/Aortic Plaque, Age if 65-74, or Female] Above score calculated as 2 points each if present [Age > 75, or Stroke/TIA/TE]  Current medicines are reviewed at length with the patient today.   The patient does not have concerns regarding her medicines.  The following changes were made today:  ***  Labs/ tests ordered today include:  No orders of the defined types were placed in this encounter.    Disposition:   FU with Rudolph Daoust  *** months  Signed, Kurtis Anastasia [name], MD  12/30/2016 1:35 PM     [place] [phone] (office) [phone] (fax)

## 2016-12-31 ENCOUNTER — Encounter: Payer: Self-pay | Admitting: Internal Medicine

## 2016-12-31 ENCOUNTER — Ambulatory Visit (INDEPENDENT_AMBULATORY_CARE_PROVIDER_SITE_OTHER): Payer: Medicare HMO | Admitting: Internal Medicine

## 2016-12-31 ENCOUNTER — Other Ambulatory Visit: Payer: Self-pay | Admitting: Internal Medicine

## 2016-12-31 VITALS — BP 87/59 | HR 94 | Temp 97.7°F | Ht 65.5 in | Wt 158.4 lb

## 2016-12-31 DIAGNOSIS — H918X1 Other specified hearing loss, right ear: Secondary | ICD-10-CM

## 2016-12-31 DIAGNOSIS — F5105 Insomnia due to other mental disorder: Secondary | ICD-10-CM | POA: Diagnosis not present

## 2016-12-31 DIAGNOSIS — G4700 Insomnia, unspecified: Secondary | ICD-10-CM | POA: Insufficient documentation

## 2016-12-31 DIAGNOSIS — Z79899 Other long term (current) drug therapy: Secondary | ICD-10-CM

## 2016-12-31 DIAGNOSIS — H9190 Unspecified hearing loss, unspecified ear: Secondary | ICD-10-CM

## 2016-12-31 DIAGNOSIS — K089 Disorder of teeth and supporting structures, unspecified: Secondary | ICD-10-CM | POA: Insufficient documentation

## 2016-12-31 DIAGNOSIS — I4891 Unspecified atrial fibrillation: Secondary | ICD-10-CM | POA: Diagnosis not present

## 2016-12-31 DIAGNOSIS — F411 Generalized anxiety disorder: Secondary | ICD-10-CM

## 2016-12-31 DIAGNOSIS — R413 Other amnesia: Secondary | ICD-10-CM | POA: Insufficient documentation

## 2016-12-31 DIAGNOSIS — S025XXA Fracture of tooth (traumatic), initial encounter for closed fracture: Secondary | ICD-10-CM | POA: Insufficient documentation

## 2016-12-31 DIAGNOSIS — Z1159 Encounter for screening for other viral diseases: Secondary | ICD-10-CM | POA: Diagnosis not present

## 2016-12-31 DIAGNOSIS — G935 Compression of brain: Secondary | ICD-10-CM | POA: Diagnosis not present

## 2016-12-31 DIAGNOSIS — E2839 Other primary ovarian failure: Secondary | ICD-10-CM

## 2016-12-31 DIAGNOSIS — F3341 Major depressive disorder, recurrent, in partial remission: Secondary | ICD-10-CM

## 2016-12-31 DIAGNOSIS — K0889 Other specified disorders of teeth and supporting structures: Secondary | ICD-10-CM

## 2016-12-31 DIAGNOSIS — Z780 Asymptomatic menopausal state: Secondary | ICD-10-CM | POA: Insufficient documentation

## 2016-12-31 DIAGNOSIS — F329 Major depressive disorder, single episode, unspecified: Secondary | ICD-10-CM

## 2016-12-31 DIAGNOSIS — Z1231 Encounter for screening mammogram for malignant neoplasm of breast: Secondary | ICD-10-CM

## 2016-12-31 DIAGNOSIS — H8109 Meniere's disease, unspecified ear: Secondary | ICD-10-CM | POA: Diagnosis not present

## 2016-12-31 DIAGNOSIS — Z0000 Encounter for general adult medical examination without abnormal findings: Secondary | ICD-10-CM

## 2016-12-31 DIAGNOSIS — I480 Paroxysmal atrial fibrillation: Secondary | ICD-10-CM

## 2016-12-31 DIAGNOSIS — G4701 Insomnia due to medical condition: Secondary | ICD-10-CM

## 2016-12-31 HISTORY — DX: Asymptomatic menopausal state: Z78.0

## 2016-12-31 HISTORY — DX: Other amnesia: R41.3

## 2016-12-31 NOTE — Assessment & Plan Note (Signed)
This issue is related to her anxiety and depression.  She is treated at [facility].    Plan Continue seroquel and trazodone.

## 2016-12-31 NOTE — Assessment & Plan Note (Signed)
She has followed with ENT.  She had an MRI in November of last year which did not show any acute issues.  I reminded her that they had wanted to see her in follow up and she reports that she will schedule an appointment.   Plan Continue PRN meclizine.

## 2016-12-31 NOTE — Assessment & Plan Note (Signed)
Reviewed NSG note, no need for treatment or further work up.  Monitor.

## 2016-12-31 NOTE — Assessment & Plan Note (Signed)
Her symptoms seem to be normal for age and possibly worsened by stress (new job).  She scored a 4/5 on the mini-cog which is usually normal.  I asked her to keep a diary of her memory issues to try and get a sense of how often they are happening.  We will do a MOCA at next visit for a more thorough evaluation.

## 2016-12-31 NOTE — Patient Instructions (Addendum)
Ms. Botkin - -   Thank you for coming in to see me today.   Please keep a diary of your memory issues during the time before I see you next.  Please keep track of specific issues with forgetfulness or lost time and we will assess further at next visit.    Please come back to see me in about 3 months, sooner if needed.   Thank you!  I will contact you by phone or mail in the next week or so with your lab results.

## 2016-12-31 NOTE — Assessment & Plan Note (Signed)
Multiple broken teeth.  No acute abscess or swelling noted on exam.  She would like to see a dentist.  I provided her with a note recommending dental evaluation which she wanted to take to vocational rehab.  If this is not sufficient, will provide her with information regarding inexpensive dental care in the community.

## 2016-12-31 NOTE — Assessment & Plan Note (Signed)
Chronic and long standing from surgeries in the 1980s.  She does not wear hearing aids.  Monitor.

## 2016-12-31 NOTE — Progress Notes (Signed)
Subjective:    Patient ID: Sheri [name], female    DOB: 1951/05/14, 66 y.o.   MRN: [id number]  CC: 2 month follow up for Afib, meet new provider  HPI  [name] is a 66yo woman with PMH of Afib s/p ablation, Anxiety and depression, Meniere's disease who presents for routine follow up.    I reviewed her ENT note from 05/06/16 - there was concern for persistent dizziness and MRI brain was done showing mild mastoid effusion, and mild frontal atrophy with no acute issue.   She was supposed to follow up with them after the MRI which took place on 05/21/16.   I reviewed NSG note from 03/19/16 for Chiari Malformation type 1.  At that visit, it was determined that the malformation was incidental and asymptomatic and no further work up or treatment was needed.    Today, [name] reports that she is doing relatively well.  She reports acute complaints of broken teeth.  She has very poor dentition and has had issues getting in to see a dentist.  She notes that the tooth that was holding in her partial is now broken.  She has no pain, no abscess, no drainage, but she does not like them, it makes it difficult for her to eat and she is concerned they will get infected.    She further reports memory issues which seem new over the last few months to a year.  She looked it up online and notes that she thinks it may be due to stress. She has no FH of dementia, but she is concerned she may be developing dementia.  She notes issues with short term memory, specifically forgetting things she is doing, misplacing things in obvious places, forgetting where things are in her home.  She knows that these can be normal, but she feels they are excessive for her.  She further forgets familiar people who are acquaintances and she feels she should know.  She does not get lost, but sometimes loses time when she is daydreaming.  She further has a new job at which she must remember a great number of details and she is concerned she will  not be able to cope with this job.    She follows with [facility] for [facility] health services.  She is taking Xanax, venlafaxine, gabapentin, seroquel and trazodone prescribed by that group.  She would like her liver function tested today because she is concerned the drugs may be having an effect.    She is due for MMG and DEXA which she is amenable to scheduling today.  Review of Systems  Constitutional: Negative for chills and fever.  HENT: Positive for dental problem and hearing loss (chronic due to previous surgeries on right ear). Negative for facial swelling and mouth sores.   Respiratory: Negative for cough, choking and shortness of breath.   Cardiovascular: Negative for chest pain and leg swelling.  Gastrointestinal: Negative for abdominal distention and abdominal pain.  Genitourinary: Positive for vaginal discharge (She believes it is more than normal, but this is a chronic issue). Negative for vaginal pain.  Neurological: Negative for dizziness (She has chronically due to meniere's disease, none today) and light-headedness.  Psychiatric/Behavioral: Positive for sleep disturbance. The patient is nervous/anxious.        Objective:   Physical Exam  Constitutional: She is oriented to person, place, and time. She appears well-developed and well-nourished. No distress.  HENT:  Head: Normocephalic and atraumatic.  Mouth/Throat: Oropharynx is  clear and moist. She does not have dentures. No oral lesions. Abnormal dentition. Dental caries present. No dental abscesses. No oropharyngeal exudate.  Cardiovascular: Normal rate and regular rhythm.   No murmur heard. Pulmonary/Chest: Effort normal and breath sounds normal. No respiratory distress.  Neurological: She is alert and oriented to person, place, and time.  Psychiatric: Her mood appears anxious. Her affect is not angry and not inappropriate. Her speech is not rapid and/or pressured. She does not exhibit a depressed mood.  Mini Cog was 4 -  missed one for recall of one word.  Clock normal    CMET, CBC, HCV today      Assessment & Plan:  RTC in 3 months, sooner if needed.

## 2016-12-31 NOTE — Assessment & Plan Note (Signed)
She is due for MMG and Dexa which were ordered today.  Screen for HCV.  HIV screen negative.

## 2016-12-31 NOTE — Assessment & Plan Note (Signed)
She is treated at [facility].  She is concerned that her blood work may be affected by her medications.  She is currently on a low dose of xanax, venlafaxine.  She is trying to wean off the xanax, and it is working, albeit slowly.   Plan Continue follow up at [facility] Rx refilled by that group - venlafaxine, xanax.

## 2016-12-31 NOTE — Assessment & Plan Note (Signed)
She is s/p ablation.  She confirmed to me today that she is not interested in being on anticoagulation despite risk of stroke. She is on PRN propranolol.  She had a regular rhythm on exam. Her resting BP was low and pulse was 94.  She denied palpitations or lightheadedness.   Plan Continue PRN propranolol EKG at next visit.

## 2016-12-31 NOTE — Assessment & Plan Note (Signed)
She reports that in addition to venlafaxine, [facility] is prescribing her gabapentin for her depression.  She feels these are working and she plans to follow up with [facility].   Plan Continue venlafaxine and gabapentin

## 2017-01-01 LAB — CBC
HEMATOCRIT: 41 % (ref 34.0–46.6)
HEMOGLOBIN: 13.6 g/dL (ref 11.1–15.9)
MCH: 29.6 pg (ref 26.6–33.0)
MCHC: 33.2 g/dL (ref 31.5–35.7)
MCV: 89 fL (ref 79–97)
Platelets: 338 10*3/uL (ref 150–379)
RBC: 4.6 x10E6/uL (ref 3.77–5.28)
RDW: 13.2 % (ref 12.3–15.4)
WBC: 6.5 10*3/uL (ref 3.4–10.8)

## 2017-01-01 LAB — CMP14 + ANION GAP
A/G RATIO: 1.4 (ref 1.2–2.2)
ALT: 23 IU/L (ref 0–32)
AST: 19 IU/L (ref 0–40)
Albumin: 3.9 g/dL (ref 3.6–4.8)
Alkaline Phosphatase: 74 IU/L (ref 39–117)
Anion Gap: 13 mmol/L (ref 10.0–18.0)
BILIRUBIN TOTAL: 0.4 mg/dL (ref 0.0–1.2)
BUN/Creatinine Ratio: 10 — ABNORMAL LOW (ref 12–28)
BUN: 8 mg/dL (ref 8–27)
CALCIUM: 9.2 mg/dL (ref 8.7–10.3)
CHLORIDE: 104 mmol/L (ref 96–106)
CO2: 24 mmol/L (ref 20–29)
Creatinine, Ser: 0.8 mg/dL (ref 0.57–1.00)
GFR, EST AFRICAN AMERICAN: 89 mL/min/{1.73_m2} (ref >59)
GFR, EST NON AFRICAN AMERICAN: 77 mL/min/{1.73_m2} (ref >59)
GLUCOSE: 105 mg/dL — AB (ref 65–99)
Globulin, Total: 2.7 g/dL (ref 1.5–4.5)
POTASSIUM: 4.4 mmol/L (ref 3.5–5.2)
Sodium: 141 mmol/L (ref 134–144)
TOTAL PROTEIN: 6.6 g/dL (ref 6.0–8.5)

## 2017-01-01 LAB — HEPATITIS C ANTIBODY

## 2017-01-02 ENCOUNTER — Encounter: Payer: Self-pay | Admitting: Cardiology

## 2017-01-05 ENCOUNTER — Encounter: Payer: Self-pay | Admitting: Internal Medicine

## 2017-01-13 ENCOUNTER — Ambulatory Visit: Payer: Medicare HMO

## 2017-01-13 ENCOUNTER — Ambulatory Visit
Admission: RE | Admit: 2017-01-13 | Discharge: 2017-01-13 | Disposition: A | Discharge status: Home / Self Care | Payer: Medicare HMO | Source: Ambulatory Visit | Attending: Internal Medicine | Admitting: Internal Medicine

## 2017-01-13 ENCOUNTER — Other Ambulatory Visit: Payer: Medicare HMO

## 2017-01-13 DIAGNOSIS — E2839 Other primary ovarian failure: Secondary | ICD-10-CM

## 2017-02-06 ENCOUNTER — Ambulatory Visit (HOSPITAL_COMMUNITY)
Admission: EM | Admit: 2017-02-06 | Discharge: 2017-02-06 | Disposition: A | Discharge status: Home / Self Care | Payer: Medicare HMO | Attending: Family | Admitting: Family

## 2017-02-06 ENCOUNTER — Encounter (HOSPITAL_COMMUNITY): Payer: Self-pay | Admitting: Emergency Medicine

## 2017-02-06 DIAGNOSIS — R21 Rash and other nonspecific skin eruption: Secondary | ICD-10-CM | POA: Diagnosis not present

## 2017-02-06 MED ORDER — METHYLPREDNISOLONE ACETATE 40 MG/ML IJ SUSP
INTRAMUSCULAR | Status: AC
  Filled 2017-02-06: qty 1

## 2017-02-06 MED ORDER — METHYLPREDNISOLONE ACETATE 40 MG/ML IJ SUSP
40.0000 mg | Freq: Once | INTRAMUSCULAR | Status: AC
  Administered 2017-02-06: 40 mg via INTRAMUSCULAR

## 2017-02-06 NOTE — ED Provider Notes (Signed)
CSN: [id number]     Arrival date & time 02/06/17  1619 History   First MD Initiated Contact with Patient 02/06/17 1652     Chief Complaint  Patient presents with  . Rash   (Consider location/radiation/quality/duration/timing/severity/associated sxs/prior Treatment) CC: rash bilateral arms and right x 3 days, improving  Rash started 3 days ago on right arm.  No arthralgia, N, fever, HA.  Some itching. Non painful. Has been using cortizone cream without relief.  No new medications, new lotions/creams  Works in Education administrator for work however known chemicals have  Been on skin, no bleach.    No allergies to foods, eczema, asthma.  NO ticks.    Has had prednisone in the past however before prefers IM injection. No increased anxiety on prednisone. Not concerned taking Depo-Medrol and affecting her sleep tonight.         Past Medical History:  Diagnosis Date  . Allergic rhinitis   . Anxiety   . Atrial fibrillation with RVR ([place])   . Chronic sinusitis   . DEGENERATIVE JOINT DISEASE, HANDS    several images revealing OA  . Depression    treated at [facility]  . Goiter    hx of goiter  . Hx of atrial tachycardia since age 51 03/15/2013  . Palpitations    Associated with difficulty breathing  . Panic attacks   . Poisoning by selective serotonin reuptake inhibitors(969.03) Jan 2007   severe reaction with MAOI (nardil) and dextromethorphan  . Tachycardia    Past Surgical History:  Procedure Laterality Date  . APPENDECTOMY    . ELECTROPHYSIOLOGIC STUDY N/A 10/12/2015   Procedure: Atrial Fibrillation Ablation;  Surgeon: [name], MD;  Location: [facility];  Service: Cardiovascular;  Laterality: N/A;  . OVARIAN CYST REMOVAL  march 1969   left ovarian cystectomy  . right mastoidectomy     ET tube placement in 1980 and 1990  . right sinus surgery  october 2000  . THYROIDECTOMY, PARTIAL     nodule removal - April 1996   Family History  Problem Relation  Age of Onset  . Hypertension Mother   . Hypertension Father   . Psoriasis Father   . Atrial fibrillation Sister        had a successful a fib ablation  . Atrial fibrillation Sister        had a successful a fib ablation   Social History  Substance Use Topics  . Smoking status: Never Smoker  . Smokeless tobacco: Never Used  . Alcohol use No   OB History    Gravida Para Term Preterm AB Living   4 3 3   1 3    SAB TAB Ectopic Multiple Live Births   1             Review of Systems  Constitutional: Negative for chills and fever.  Respiratory: Negative for cough and shortness of breath.   Cardiovascular: Negative for chest pain and palpitations.  Gastrointestinal: Negative for nausea and vomiting.  Musculoskeletal: Negative for arthralgias.  Skin: Positive for rash.    Allergies  Guaifenesin  Home Medications   Prior to Admission medications   Medication Sig Start Date End Date Taking? Authorizing Provider  albuterol (PROVENTIL HFA;VENTOLIN HFA) 108 (90 Base) MCG/ACT inhaler Inhale 1-2 puffs into the lungs every 6 (six) hours as needed for wheezing or shortness of breath. 07/25/16   Barnet Glasgow, NP  ALPRAZolam Duanne Moron) 0.5 MG tablet Take 0.5 mg by mouth  2 (two) times daily.     [provider]  gabapentin (NEURONTIN) 300 MG capsule Take 300 mg by mouth 3 (three) times daily.     [provider]  meclizine (ANTIVERT) 25 MG tablet TAKE ONE TABLET BY MOUTH THREE TIMES DAILY AS NEEDED FOR DIZZINESS 05/13/16   Norman Herrlich, MD  propranolol (INDERAL) 10 MG tablet Take 1 tablet (10 mg total) by mouth 3 (three) times daily as needed. 10/16/16   Camnitz, [name], MD  QUEtiapine (SEROQUEL) 200 MG tablet Take 100 mg by mouth at bedtime.    [provider]  traZODone (DESYREL) 100 MG tablet Take 100 mg by mouth at bedtime.    [provider]  venlafaxine XR (EFFEXOR-XR) 75 MG 24 hr capsule Take 75 mg by mouth 3 (three) times daily.     [provider]   Meds Ordered and Administered this Visit  Medications - No data to display  BP 101/63 (BP Location: Right Arm)   Pulse 84   Temp 98.2 F (36.8 C) (Oral)   Resp 18   SpO2 96%  No data found.   Physical Exam  Constitutional: She appears well-developed and well-nourished.  Eyes: Conjunctivae are normal.  Cardiovascular: Normal rate, regular rhythm, normal heart sounds and normal pulses.   Pulmonary/Chest: Effort normal and breath sounds normal. She has no wheezes. She has no rhonchi. She has no rales.  Neurological: She is alert.  Skin: Skin is warm and dry. Lesion and rash noted. No petechiae noted. Rash is macular. Rash is not nodular and not vesicular. No erythema.  Several discrete papular lesions noted right dorsal aspect of forearm, right calf, and one posterior left knee. Scabbed over and excoriated in appearance. No purulent discharge, erythema, swelling, streaking, vesicles.   Psychiatric: She has a normal mood and affect. Her speech is normal and behavior is normal. Thought content normal.  Vitals reviewed.   Urgent Care Course     Procedures (including critical care time)   MDM   1. Rash    Afebrile. Patient is well-appearing with no systemic features. Acute onset. Etiology rash is nonspecific at this time. Working diagnosis contact dermatitis  perhaps she has come in contact with skin irritant while cleaning. Patient has a preference for IM Depo-Medrol versus by mouth prednisone taper. We'll go ahead and give Depo-Medrol today  advised her to follow-up with PCP as she may need to see primary care or the dermatologist. Return precautions given.    Burnard Hawthorne, [place] 02/06/17 [phone]

## 2017-02-06 NOTE — ED Triage Notes (Signed)
The patient presented to the [facility] with a complaint of a rash on her arms and legs x 3 days.

## 2017-02-06 NOTE — Discharge Instructions (Signed)
Cause of rash is nonspecific at this time. You may come in contact with a cleaning product which has irritated your skin. If you do not improve after Depo-Medrol injection, please contact your PCP for further evaluation.  If there is no improvement in your symptoms, or if there is any worsening of symptoms, or if you have any additional concerns, please return for re-evaluation; or, if we are closed, consider going to the Emergency Room for evaluation if symptoms urgent.

## 2017-03-02 IMAGING — CT CT HEART MORP W/ CTA COR W/ SCORE W/ CA W/CM &/OR W/O CM
1 of 10 series · 1 of 20 positions shown, 2 images · IV contrast (Iodine)
Comparison: 03/15/2013

CLINICAL DATA: Chest pain

EXAM:
Cardiac CTA
MEDICATIONS:
Sub lingual nitro. 4mg and lopressor 7.5mg IV
TECHNIQUE: The patient was scanned on a [employer] [REDACTED]ice scanner. Gantry
rotation speed was 270 msecs. Collimation was .9mm. A 100 kV
prospective scan was triggered in the descending thoracic aorta at
111 HU's with 5% padding centered around 78% of the R-R interval.
Average HR during the scan was 65 bpm. The 3D data set was
interpreted on a dedicated work station using MPR, MIP and VRT
modes. A total of 80cc of contrast was used.

[Series 300: locator · axial · 0.35mm/px · z∈[-140,-140]mm · 1 of 1 slices shown, 2 images]
[im 1/1  vessel]
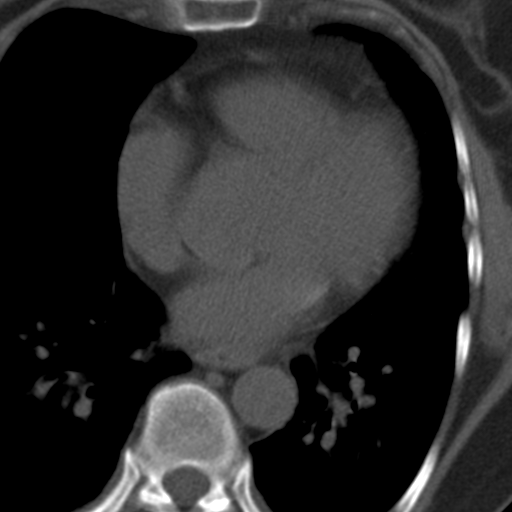
[im 1/1  lung]
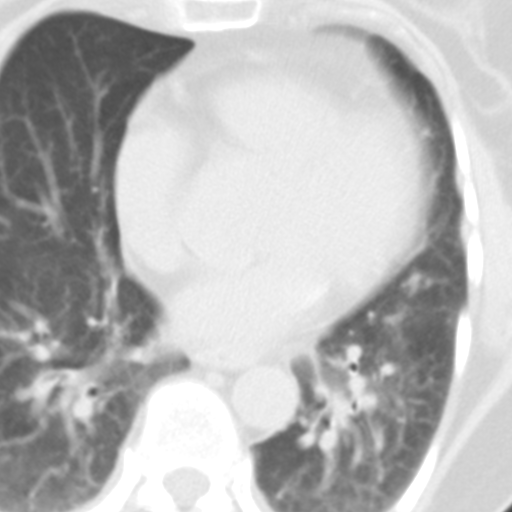

[1 of 20 positions shown; findings below may reference images not displayed]

FINDINGS: Non-cardiac: See separate report from [REDACTED].

Calcium Score:  17 Agatston units.

Coronary Arteries: Right dominant with no anomalies

LM:  No plaque or stenosis.

LAD system: No plaque or stenosis in the LAD itself. There was a
moderate 1st diagonal with a small area of calcified plaque at the
ostium, no significant stenosis.

Circumflex system: There was a small ramus and large 1st obtuse
marginal. No plaque or stenosis.

RCA:  No plaque or stenosis.

The pulmonary veins drained normally to the left atrium. There did
not appear to be a left atrial appendage thrombus.

Left upper PV:  18 x 13 mm

Left lower PV:  13 x 13 mm

Right upper PV:  17 x 12 mm

Right lower PV:  16 x 13 mm

Mild ascending aorta dilation, see radiology report. l
IMPRESSION: 1.  No obstructive coronary disease noted.

2. Coronary artery calcium score 17 Agatston units. This places the
patient in the 54th percentile for age and gender. This suggests
intermediate risk for future cardiac event.

3.  Pulmonary veins as noted above.

Abdel Fateh Bellila

EXAM:
OVER-READ INTERPRETATION  CT CHEST

The following report is an over-read performed by radiologist Dr.
over-read does not include interpretation of cardiac or coronary
anatomy or pathology. The coronary CTA interpretation by the
cardiologist is attached.
FINDINGS: Mediastinum/Nodes: Note made of ascending aortic aneurysm 4.3 cm
diameter.

Small mediastinal lymph nodes are not pathologically enlarged by
size criteria.

Mild distal esophageal wall thickening.  This is circumferential.

Lungs/Pleura: Mild dependent subsegmental atelectasis in both lower
lobes. Subtle nodularity to the apparent scarring or atelectasis in
the left lower lobe on images 28-30 series 204, this area somewhat
obscured on prior CT chest from 2571 due to pleural effusions and
atelectasis. Largest nodular component 3 by 5 mm.

Upper abdomen: Unremarkable

Musculoskeletal: Unremarkable
IMPRESSION: 1. Ascending aortic aneurysm 4.3 cm in diameter. Recommend annual
imaging followup by CTA or MRA. This recommendation follows 9383
ACCF/AHA/AATS/ACR/ASA/SCA/KE/DIOSDADO/KAMCHAO/LIBERIUS Guidelines for the
Diagnosis and Management of Patients with Thoracic Aortic Disease.
Circulation. 9383; 121: [id number]
2. Mild distal esophageal wall thickening circumferentially,
probably from esophagitis.
3. Mild dependent subsegmental atelectasis in both lower lobes.
4. Average size 4 mm nodule in the left lower lobe. If the patient
is at low risk for lung cancer, no further follow up is necessary.
If the patient is at high risk for lung cancer, a follow up CT of
the chest in 12 months time should be considered. This corresponds
to the [place] 9786 revised guidelines.

## 2017-03-03 ENCOUNTER — Telehealth: Payer: Self-pay | Admitting: *Deleted

## 2017-03-03 NOTE — Telephone Encounter (Signed)
Mammogram/ Bone Density appt card returned - "unable to forward". Called pt and left message to re-schedule her appts at [facility] [phone].

## 2017-08-05 ENCOUNTER — Encounter (HOSPITAL_COMMUNITY): Payer: Self-pay | Admitting: *Deleted

## 2017-08-05 ENCOUNTER — Other Ambulatory Visit: Payer: Self-pay

## 2017-08-05 NOTE — H&P (Signed)
Subjective:    Sheri [name] is a 66 y.o. female who presents for evaluation of left lower eyelid basal cell cancer. The pain is described as none. Onset was several months ago. Symptoms have been unchanged since.   Review of Systems Pertinent items are noted in HPI.    Objective:   There were no vitals taken for this visit.  General:  alert, cooperative and appears stated age Skin:  left lower lid basal cell cancer  Eyes: conjunctivae/corneas clear. PERRL, EOM's intact. Fundi benign., left lower lid basal cell cancer s/p excisional biopsy [facility] 20/30 OU no APD Mouth: MMM no lesions Lymph Nodes:  Cervical, supraclavicular, and axillary nodes normal. Lungs:  clear to auscultation bilaterally Heart:  regular rate and rhythm, S1, S2 normal, no murmur, click, rub or gallop Abdomen: soft, non-tender; bowel sounds normal; no masses,  no organomegaly CVA:  absent Genitourinary: defer exam Extremities:  extremities normal, atraumatic, no cyanosis or edema Neurologic:  negative Psychiatric:  normal mood, behavior, speech, dress, and thought processes    Assessment: Left Lower Eyelid Basal Cell Cancer    Plan: Excisional Biopsy with Frozen Section and Total Reconstruction of the Left Lower Lid  1. Discussed the risk of surgery,  and the risks of general anesthetic including MI, CVA, sudden death or even reaction to anesthetic medications. The patient understands the risks, any and all questions were answered to the patient's satisfaction. 2. Follow up: 1 day.  Date of Surgery Update (To be completed by Attending Surgeon day of surgery.)

## 2017-08-05 NOTE — Progress Notes (Signed)
Spoke with pt for pre-op call. Pt has hx of A-fib and palpitations. Has had an ablation for the A-fib. Denies any chest pain or sob. Pt states she is not a diabetic.

## 2017-08-06 ENCOUNTER — Ambulatory Visit (HOSPITAL_COMMUNITY)
Admission: RE | Admit: 2017-08-06 | Discharge: 2017-08-06 | Disposition: A | Discharge status: Home / Self Care | Payer: Medicare HMO | Source: Ambulatory Visit | Attending: Oculoplastics Ophthalmology | Admitting: Oculoplastics Ophthalmology

## 2017-08-06 ENCOUNTER — Ambulatory Visit (HOSPITAL_COMMUNITY): Payer: Medicare HMO | Admitting: Certified Registered"

## 2017-08-06 ENCOUNTER — Encounter (HOSPITAL_COMMUNITY): Payer: Self-pay | Admitting: Certified Registered"

## 2017-08-06 ENCOUNTER — Encounter (HOSPITAL_COMMUNITY)
Admission: RE | Disposition: A | Discharge status: Home / Self Care | Payer: Self-pay | Source: Ambulatory Visit | Attending: Oculoplastics Ophthalmology

## 2017-08-06 DIAGNOSIS — I4891 Unspecified atrial fibrillation: Secondary | ICD-10-CM | POA: Diagnosis not present

## 2017-08-06 DIAGNOSIS — Z79899 Other long term (current) drug therapy: Secondary | ICD-10-CM | POA: Diagnosis not present

## 2017-08-06 DIAGNOSIS — C441192 Basal cell carcinoma of skin of left lower eyelid, including canthus: Secondary | ICD-10-CM | POA: Insufficient documentation

## 2017-08-06 DIAGNOSIS — F418 Other specified anxiety disorders: Secondary | ICD-10-CM | POA: Diagnosis not present

## 2017-08-06 DIAGNOSIS — K219 Gastro-esophageal reflux disease without esophagitis: Secondary | ICD-10-CM | POA: Diagnosis not present

## 2017-08-06 DIAGNOSIS — J449 Chronic obstructive pulmonary disease, unspecified: Secondary | ICD-10-CM | POA: Diagnosis not present

## 2017-08-06 DIAGNOSIS — I10 Essential (primary) hypertension: Secondary | ICD-10-CM | POA: Insufficient documentation

## 2017-08-06 HISTORY — DX: Chronic obstructive pulmonary disease, unspecified: J44.9

## 2017-08-06 HISTORY — PX: LID LESION EXCISION: SHX5204

## 2017-08-06 HISTORY — DX: Gastro-esophageal reflux disease without esophagitis: K21.9

## 2017-08-06 HISTORY — DX: Malignant (primary) neoplasm, unspecified: C80.1

## 2017-08-06 LAB — CBC
HEMATOCRIT: 42.6 % (ref 36.0–46.0)
Hemoglobin: 13.8 g/dL (ref 12.0–15.0)
MCH: 29.9 pg (ref 26.0–34.0)
MCHC: 32.4 g/dL (ref 30.0–36.0)
MCV: 92.2 fL (ref 78.0–100.0)
PLATELETS: 431 10*3/uL — AB (ref 150–400)
RBC: 4.62 MIL/uL (ref 3.87–5.11)
RDW: 12.4 % (ref 11.5–15.5)
WBC: 8 10*3/uL (ref 4.0–10.5)

## 2017-08-06 LAB — BASIC METABOLIC PANEL
Anion gap: 9 (ref 5–15)
BUN: 7 mg/dL (ref 6–20)
CHLORIDE: 107 mmol/L (ref 101–111)
CO2: 24 mmol/L (ref 22–32)
CREATININE: 0.72 mg/dL (ref 0.44–1.00)
Calcium: 9 mg/dL (ref 8.9–10.3)
GFR calc Af Amer: >60 mL/min (ref >60)
GFR calc non Af Amer: >60 mL/min (ref >60)
GLUCOSE: 98 mg/dL (ref 65–99)
POTASSIUM: 3.8 mmol/L (ref 3.5–5.1)
SODIUM: 140 mmol/L (ref 135–145)

## 2017-08-06 LAB — PROTIME-INR
INR: 1.05
Prothrombin Time: 13.6 seconds (ref 11.4–15.2)

## 2017-08-06 SURGERY — EXCISION, LESION, EYELID
Anesthesia: General | Site: Eye | Laterality: Left

## 2017-08-06 MED ORDER — FENTANYL CITRATE (PF) 100 MCG/2ML IJ SOLN
INTRAMUSCULAR | Status: DC | PRN
  Administered 2017-08-06: 25 ug via INTRAVENOUS
  Administered 2017-08-06: 50 ug via INTRAVENOUS

## 2017-08-06 MED ORDER — MIDAZOLAM HCL 5 MG/5ML IJ SOLN
INTRAMUSCULAR | Status: DC | PRN
  Administered 2017-08-06: 2 mg via INTRAVENOUS

## 2017-08-06 MED ORDER — HYDROCODONE-ACETAMINOPHEN 5-325 MG PO TABS: 1.0000 | ORAL_TABLET | Freq: Four times a day (QID) | ORAL | 0 refills | Status: AC | PRN

## 2017-08-06 MED ORDER — PHENYLEPHRINE HCL 10 MG/ML IJ SOLN
INTRAVENOUS | Status: DC | PRN
  Administered 2017-08-06: 40 ug/min via INTRAVENOUS

## 2017-08-06 MED ORDER — BUPIVACAINE HCL (PF) 0.75 % IJ SOLN
INTRAMUSCULAR | Status: DC | PRN
  Administered 2017-08-06: 20 mL

## 2017-08-06 MED ORDER — 0.9 % SODIUM CHLORIDE (POUR BTL) OPTIME
TOPICAL | Status: DC | PRN
  Administered 2017-08-06: 1000 mL

## 2017-08-06 MED ORDER — FENTANYL CITRATE (PF) 100 MCG/2ML IJ SOLN
25.0000 ug | INTRAMUSCULAR | Status: DC | PRN
Start: 2017-08-06 — End: 2017-08-06

## 2017-08-06 MED ORDER — DEXAMETHASONE SODIUM PHOSPHATE 4 MG/ML IJ SOLN
INTRAMUSCULAR | Status: DC | PRN
  Administered 2017-08-06: 8 mg via INTRAVENOUS

## 2017-08-06 MED ORDER — EPHEDRINE 5 MG/ML INJ
INTRAVENOUS | Status: AC
  Filled 2017-08-06: qty 10

## 2017-08-06 MED ORDER — STERILE WATER FOR IRRIGATION IR SOLN
Status: DC | PRN
  Administered 2017-08-06: 1000 mL

## 2017-08-06 MED ORDER — LIDOCAINE-EPINEPHRINE 1 %-1:100000 IJ SOLN
INTRAMUSCULAR | Status: AC
  Filled 2017-08-06: qty 1

## 2017-08-06 MED ORDER — LIDOCAINE-EPINEPHRINE 1 %-1:100000 IJ SOLN
INTRAMUSCULAR | Status: DC | PRN
  Administered 2017-08-06: 20 mL

## 2017-08-06 MED ORDER — LIDOCAINE 2% (20 MG/ML) 5 ML SYRINGE
INTRAMUSCULAR | Status: DC | PRN
  Administered 2017-08-06: 20 mg via INTRAVENOUS

## 2017-08-06 MED ORDER — ONDANSETRON HCL 4 MG/2ML IJ SOLN
INTRAMUSCULAR | Status: AC
  Filled 2017-08-06: qty 2

## 2017-08-06 MED ORDER — MEPERIDINE HCL 25 MG/ML IJ SOLN: 6.2500 mg | INTRAMUSCULAR | Status: DC | PRN

## 2017-08-06 MED ORDER — DEXAMETHASONE SODIUM PHOSPHATE 10 MG/ML IJ SOLN
INTRAMUSCULAR | Status: AC
  Filled 2017-08-06: qty 1

## 2017-08-06 MED ORDER — PHENYLEPHRINE 40 MCG/ML (10ML) SYRINGE FOR IV PUSH (FOR BLOOD PRESSURE SUPPORT)
PREFILLED_SYRINGE | INTRAVENOUS | Status: AC
  Filled 2017-08-06: qty 10

## 2017-08-06 MED ORDER — LACTATED RINGERS IV SOLN: INTRAVENOUS | Status: DC | PRN

## 2017-08-06 MED ORDER — PROPOFOL 10 MG/ML IV BOLUS
INTRAVENOUS | Status: AC
  Filled 2017-08-06: qty 20

## 2017-08-06 MED ORDER — ROCURONIUM BROMIDE 10 MG/ML (PF) SYRINGE
PREFILLED_SYRINGE | INTRAVENOUS | Status: AC
  Filled 2017-08-06: qty 5

## 2017-08-06 MED ORDER — PHENYLEPHRINE 40 MCG/ML (10ML) SYRINGE FOR IV PUSH (FOR BLOOD PRESSURE SUPPORT)
PREFILLED_SYRINGE | INTRAVENOUS | Status: DC | PRN
  Administered 2017-08-06: 80 ug via INTRAVENOUS

## 2017-08-06 MED ORDER — MIDAZOLAM HCL 2 MG/2ML IJ SOLN: 0.5000 mg | Freq: Once | INTRAMUSCULAR | Status: DC | PRN

## 2017-08-06 MED ORDER — SODIUM CHLORIDE 0.9 % IV SOLN
INTRAVENOUS | Status: DC
  Administered 2017-08-06: 13:00:00 via INTRAVENOUS
  Administered 2017-08-06: 10 mL/h via INTRAVENOUS

## 2017-08-06 MED ORDER — MIDAZOLAM HCL 2 MG/2ML IJ SOLN
INTRAMUSCULAR | Status: AC
  Filled 2017-08-06: qty 2

## 2017-08-06 MED ORDER — ONDANSETRON HCL 4 MG/2ML IJ SOLN
INTRAMUSCULAR | Status: DC | PRN
  Administered 2017-08-06: 4 mg via INTRAVENOUS

## 2017-08-06 MED ORDER — FENTANYL CITRATE (PF) 250 MCG/5ML IJ SOLN
INTRAMUSCULAR | Status: AC
  Filled 2017-08-06: qty 5

## 2017-08-06 MED ORDER — PROMETHAZINE HCL 25 MG/ML IJ SOLN: 6.2500 mg | INTRAMUSCULAR | Status: DC | PRN

## 2017-08-06 MED ORDER — LIDOCAINE 2% (20 MG/ML) 5 ML SYRINGE
INTRAMUSCULAR | Status: AC
  Filled 2017-08-06: qty 5

## 2017-08-06 MED ORDER — TOBRAMYCIN-DEXAMETHASONE 0.3-0.1 % OP OINT
TOPICAL_OINTMENT | OPHTHALMIC | Status: AC
  Filled 2017-08-06: qty 3.5

## 2017-08-06 MED ORDER — PROPOFOL 10 MG/ML IV BOLUS
INTRAVENOUS | Status: DC | PRN
  Administered 2017-08-06: 20 mg via INTRAVENOUS
  Administered 2017-08-06: 130 mg via INTRAVENOUS

## 2017-08-06 MED ORDER — TOBRAMYCIN-DEXAMETHASONE 0.3-0.1 % OP OINT
TOPICAL_OINTMENT | OPHTHALMIC | Status: DC | PRN
  Administered 2017-08-06: 1 via OPHTHALMIC

## 2017-08-06 MED ORDER — BUPIVACAINE HCL (PF) 0.75 % IJ SOLN
INTRAMUSCULAR | Status: AC
Start: 2017-08-06 — End: 2017-08-06
  Filled 2017-08-06: qty 30

## 2017-08-06 SURGICAL SUPPLY — 41 items
APL SRG 3 HI ABS STRL LF PLS (MISCELLANEOUS) ×1
APPLICATOR COTTON TIP 6IN STRL (MISCELLANEOUS) ×3 IMPLANT
APPLICATOR DR MATTHEWS STRL (MISCELLANEOUS) ×3 IMPLANT
BANDAGE EYE OVAL (MISCELLANEOUS) IMPLANT
BLADE SURG 15 STRL LF DISP TIS (BLADE) ×1 IMPLANT
BLADE SURG 15 STRL SS (BLADE) ×3
CLOSURE STERI-STRIP 1/2X4 (GAUZE/BANDAGES/DRESSINGS) ×1
CLOSURE WOUND 1/2 X4 (GAUZE/BANDAGES/DRESSINGS) ×1
CLSR STERI-STRIP ANTIMIC 1/2X4 (GAUZE/BANDAGES/DRESSINGS) ×2 IMPLANT
CORDS BIPOLAR (ELECTRODE) ×3 IMPLANT
COVER LIGHT HANDLE STERIS (MISCELLANEOUS) ×3 IMPLANT
COVER SURGICAL LIGHT HANDLE (MISCELLANEOUS) ×3 IMPLANT
DRAPE ORTHO SPLIT 87X125 STRL (DRAPES) ×3 IMPLANT
DRAPE SURG 17X23 STRL (DRAPES) ×6 IMPLANT
DRAPE UTILITY XL STRL (DRAPES) ×3 IMPLANT
DRESSING TELFA 8X10 (GAUZE/BANDAGES/DRESSINGS) IMPLANT
ELECT NDL BLADE 2-5/6 (NEEDLE) ×1 IMPLANT
ELECT NEEDLE BLADE 2-5/6 (NEEDLE) ×3 IMPLANT
ELECT REM PT RETURN 9FT ADLT (ELECTROSURGICAL) ×3
ELECTRODE REM PT RTRN 9FT ADLT (ELECTROSURGICAL) ×1 IMPLANT
FORCEPS BIPOLAR SPETZLER 8 1.0 (NEUROSURGERY SUPPLIES) ×3 IMPLANT
FRAME EYE SHIELD (PROTECTIVE WEAR) IMPLANT
GLOVE BIO SURGEON STRL SZ7.5 (GLOVE) ×6 IMPLANT
GOWN STRL REUS W/ TWL LRG LVL3 (GOWN DISPOSABLE) ×2 IMPLANT
GOWN STRL REUS W/TWL LRG LVL3 (GOWN DISPOSABLE) ×6
NEEDLE PRECISIONGLIDE 27X1.5 (NEEDLE) IMPLANT
NS IRRIG 1000ML POUR BTL (IV SOLUTION) ×3 IMPLANT
PACK CATARACT CUSTOM (CUSTOM PROCEDURE TRAY) ×3 IMPLANT
PAD ARMBOARD 7.5X6 YLW CONV (MISCELLANEOUS) ×6 IMPLANT
PENCIL BUTTON HOLSTER BLD 10FT (ELECTRODE) ×3 IMPLANT
STRIP CLOSURE SKIN 1/2X4 (GAUZE/BANDAGES/DRESSINGS) ×2 IMPLANT
SUT CHROMIC 5 0 P 3 (SUTURE) ×3 IMPLANT
SUT ETHILON 6 0 P 1 (SUTURE) IMPLANT
SUT ETHILON 7 0 P 6 18 (SUTURE) ×3 IMPLANT
SUT MERSILENE 5 0 RD 1 DA (SUTURE) ×3 IMPLANT
SUT PLAIN 5 0 P 3 18 (SUTURE) ×3 IMPLANT
SUT PLAIN 6 0 TG1408 (SUTURE) IMPLANT
SUT SILK 4 0 P 3 (SUTURE) ×3 IMPLANT
SUT VICRYL 6-0 S14 (SUTURE) IMPLANT
TOWEL OR 17X24 6PK STRL BLUE (TOWEL DISPOSABLE) ×6 IMPLANT
WATER STERILE IRR 1000ML POUR (IV SOLUTION) ×3 IMPLANT

## 2017-08-06 NOTE — Transfer of Care (Signed)
Immediate Anesthesia Transfer of Care Note  Patient: Sheri [name]  Procedure(s) Performed: EXCISION BIOPSY OF EYELID, RECONSTRUCTION OF EYELID (Left Eye)  Patient Location: PACU  Anesthesia Type:General  Level of Consciousness: awake, alert  and oriented  Airway & Oxygen Therapy: Patient Spontanous Breathing and Patient connected to face mask oxygen  Post-op Assessment: Report given to RN and Post -op Vital signs reviewed and stable  Post vital signs: Reviewed and stable  Last Vitals:  Vitals:   08/06/17 0917  BP: 107/70  Pulse: 85  Resp: 18  Temp: 36.7 C  SpO2: 98%    Last Pain:  Vitals:   08/06/17 0917  TempSrc: Oral      Patients Stated Pain Goal: 3 (62/86/38 1771)  Complications: No apparent anesthesia complications

## 2017-08-06 NOTE — Discharge Instructions (Signed)
Place ice on left lower eyelid for 20-30 mins every 2 hours for 2 days. Place ointmentTobradex Ophthalmic Ointment) in eye at nighttime for one week.  If you have an eye patch. Remove it in 24hrs.

## 2017-08-06 NOTE — Brief Op Note (Signed)
08/06/2017  10:37 AM  PATIENT:  Sheri [name]  67 y.o. female  PRE-OPERATIVE DIAGNOSIS:  BASIL CELL CARCINOMA LEFT EYELID  POST-OPERATIVE DIAGNOSIS:  * No post-op diagnosis entered *  PROCEDURE:  Procedure(s): EXCISION BIOPSY OF EYELID,TOTAL RECONSTRUCTION OF EYELID (Left)  SURGEON:  Surgeon(s) and Role:    * Abugo, Peyton Najjar, MD - Primary  PHYSICIAN ASSISTANT:   ASSISTANTS: none   ANESTHESIA:   IV sedation, MAC and lidocaine with epinephrine and marcaine  EBL: 84m  BLOOD ADMINISTERED:none  DRAINS: none   LOCAL MEDICATIONS USED:  BUPIVICAINE  and LIDOCAINE   SPECIMEN:  Source of Specimen:  left lower eyelid   DISPOSITION OF SPECIMEN:  PATHOLOGY  COUNTS:  YES  TOURNIQUET:  * No tourniquets in log *  DICTATION: .Note written in EPIC  PLAN OF CARE: Discharge to home after PACU  PATIENT DISPOSITION:  PACU - hemodynamically stable.   Delay start of Pharmacological VTE agent (>24hrs) due to surgical blood loss or risk of bleeding: no

## 2017-08-06 NOTE — Anesthesia Procedure Notes (Signed)
Procedure Name: LMA Insertion Date/Time: 08/06/2017 11:50 AM Performed by: Orlie Dakin, CRNA Pre-anesthesia Checklist: Patient identified, Emergency Drugs available, Suction available, Patient being monitored and Timeout performed Patient Re-evaluated:Patient Re-evaluated prior to induction Oxygen Delivery Method: Circle system utilized Preoxygenation: Pre-oxygenation with 100% oxygen Induction Type: IV induction Ventilation: Mask ventilation without difficulty LMA: LMA inserted LMA Size: 4.0 Tube type: Oral Number of attempts: 1 Placement Confirmation: positive ETCO2 and breath sounds checked- equal and bilateral Tube secured with: Tape Dental Injury: Teeth and Oropharynx as per pre-operative assessment  Comments: Very poor dentition noted pre-op. Many missing upper front, right incisor noted to be broken off.  Lower front blackened.  Dental advisory discussed with patient.

## 2017-08-06 NOTE — Anesthesia Postprocedure Evaluation (Signed)
Anesthesia Post Note  Patient: Sheri [name]  Procedure(s) Performed: EXCISION BIOPSY OF EYELID, RECONSTRUCTION OF EYELID (Left Eye)     Patient location during evaluation: PACU Anesthesia Type: General Level of consciousness: awake and alert, patient cooperative and oriented Pain management: pain level controlled Vital Signs Assessment: post-procedure vital signs reviewed and stable Respiratory status: spontaneous breathing, nonlabored ventilation and respiratory function stable Cardiovascular status: blood pressure returned to baseline and stable Postop Assessment: no apparent nausea or vomiting Anesthetic complications: no    Last Vitals:  Vitals:   08/06/17 1315 08/06/17 1343  BP:  (!) 118/58  Pulse: 89 89  Resp: 17 16  Temp:  (!) 36.3 C  SpO2: 96% 94%    Last Pain:  Vitals:   08/06/17 0917  TempSrc: Oral                 Camesha Farooq,E. Octavius Shin

## 2017-08-06 NOTE — Op Note (Signed)
Procedure(s): EXCISION BIOPSY OF EYELID,TOTAL RECONSTRUCTION OF EYELID Procedure Note  Sheri [name] female 67 y.o. 08/06/2017  Procedure(s) and Anesthesia Type:    * EXCISION BIOPSY OF EYELID,TOTAL RECONSTRUCTION OF EYELID - General  Surgeon(s) and Role:    * Laylee Schooley, Peyton Najjar, MD - Primary  Procedure Detail  EXCISION BIOPSY OF EYELID,TOTAL RECONSTRUCTION OF EYELID  The patient is aware that they have a basal cell cancer, necessitating excision. The patient is aware of the risks and benefits of surgery including bleeding, infection, scarring, need for re-excision, asymmetry, and loss of the eye, and loss of life. The patient elects to proceed.  The patient was transferred to the OR in supine position where they were prepped and draped in the usual standard sterile fashion for Oculoplastic surgery at [facility]. Attention was turned to the left lower eyelid which was then infiltrated with local anesthesia. The area of visible basal cell was incised with a 15 blade and then excised with a Wescotts scissors, hemostasis was maintained with monopolar cautery.   The area was then closed from meibomian gland orifice to meibomian gland orifice and then from gray line to gray line with a 6-0 undyed vicryl. Then 2 buried mattress sutures were placed to bring the incized edges together. Then the skin was closed in an interrupted fashion with a 6-0 fast gut.    Findings: Left lower eyelid s/p incisional biopsy  Estimated Blood Loss:  Minimal         Drains: none         Total IV Fluids: <1L  Blood Given: none          Specimens: left lower eyelid         Implants: none        Complications:  * No complications entered in OR log *         Disposition: PACU - hemodynamically stable.         Condition: stable

## 2017-08-06 NOTE — Interval H&P Note (Signed)
History and Physical Interval Note:  08/06/2017 11:32 AM  Sheri [name]  has presented today for surgery, with the diagnosis of BASIL CELL CARCINOMA LEFT EYELID  The various methods of treatment have been discussed with the patient and family. After consideration of risks, benefits and other options for treatment, the patient has consented to  Procedure(s): EXCISION BIOPSY OF EYELID,TOTAL RECONSTRUCTION OF EYELID (Left) as a surgical intervention .  The patient's history has been reviewed, patient examined, no change in status, stable for surgery.  I have reviewed the patient's chart and labs.  Questions were answered to the patient's satisfaction.     [name]

## 2017-08-06 NOTE — Anesthesia Preprocedure Evaluation (Addendum)
Anesthesia Evaluation  Patient identified by MRN, date of birth, ID band Patient awake    Reviewed: Allergy & Precautions, NPO status , Patient's Chart, lab work & pertinent test results  History of Anesthesia Complications Negative for: history of anesthetic complications  Airway Mallampati: II  TM Distance: >3 FB Neck ROM: Full    Dental  (+) Dental Advisory Given, Poor Dentition, Missing   Pulmonary COPD,  COPD inhaler,    breath sounds clear to auscultation       Cardiovascular hypertension, Pt. on medications and Pt. on home beta blockers (-) angina+ dysrhythmias (s/p ablation) Atrial Fibrillation and Supra Ventricular Tachycardia  Rhythm:Regular Rate:Normal  '14 ECHO: EF 55-60%, valves OK   Neuro/Psych Anxiety Depression negative neurological ROS     GI/Hepatic Neg liver ROS, GERD  Controlled,  Endo/Other  negative endocrine ROS  Renal/GU negative Renal ROS     Musculoskeletal   Abdominal   Peds  Hematology negative hematology ROS (+)   Anesthesia Other Findings   Reproductive/Obstetrics                            Anesthesia Physical Anesthesia Plan  ASA: III  Anesthesia Plan: General   Post-op Pain Management:    Induction: Intravenous  PONV Risk Score and Plan: 2 and Ondansetron and Dexamethasone  Airway Management Planned: LMA  Additional Equipment:   Intra-op Plan:   Post-operative Plan:   Informed Consent: I have reviewed the patients History and Physical, chart, labs and discussed the procedure including the risks, benefits and alternatives for the proposed anesthesia with the patient or authorized representative who has indicated his/her understanding and acceptance.   Dental advisory given  Plan Discussed with: CRNA and Surgeon  Anesthesia Plan Comments: (Plan routine monitors, GA- LMA OK)        Anesthesia Quick Evaluation

## 2017-08-07 ENCOUNTER — Encounter (HOSPITAL_COMMUNITY): Payer: Self-pay | Admitting: Oculoplastics Ophthalmology

## 2017-08-07 ENCOUNTER — Other Ambulatory Visit: Payer: Self-pay | Admitting: Cardiology

## 2017-09-04 ENCOUNTER — Encounter: Payer: Self-pay | Admitting: Internal Medicine

## 2017-09-04 DIAGNOSIS — E663 Overweight: Secondary | ICD-10-CM | POA: Insufficient documentation

## 2017-09-18 ENCOUNTER — Emergency Department (HOSPITAL_COMMUNITY)
Admission: EM | Admit: 2017-09-18 | Discharge: 2017-09-18 | Disposition: A | Discharge status: Home / Self Care | Payer: Medicare HMO | Attending: Emergency Medicine | Admitting: Emergency Medicine

## 2017-09-18 ENCOUNTER — Emergency Department (HOSPITAL_COMMUNITY): Payer: Medicare HMO

## 2017-09-18 DIAGNOSIS — N200 Calculus of kidney: Secondary | ICD-10-CM

## 2017-09-18 DIAGNOSIS — J449 Chronic obstructive pulmonary disease, unspecified: Secondary | ICD-10-CM | POA: Diagnosis not present

## 2017-09-18 DIAGNOSIS — N201 Calculus of ureter: Secondary | ICD-10-CM | POA: Diagnosis not present

## 2017-09-18 DIAGNOSIS — Z79899 Other long term (current) drug therapy: Secondary | ICD-10-CM | POA: Diagnosis not present

## 2017-09-18 DIAGNOSIS — I4891 Unspecified atrial fibrillation: Secondary | ICD-10-CM | POA: Diagnosis not present

## 2017-09-18 DIAGNOSIS — R1084 Generalized abdominal pain: Secondary | ICD-10-CM | POA: Diagnosis present

## 2017-09-18 LAB — I-STAT CHEM 8, ED
BUN: 14 mg/dL (ref 6–20)
Calcium, Ion: 1.17 mmol/L (ref 1.15–1.40)
Chloride: 106 mmol/L (ref 101–111)
Creatinine, Ser: 0.7 mg/dL (ref 0.44–1.00)
Glucose, Bld: 98 mg/dL (ref 65–99)
HCT: 43 % (ref 36.0–46.0)
Hemoglobin: 14.6 g/dL (ref 12.0–15.0)
POTASSIUM: 3.8 mmol/L (ref 3.5–5.1)
SODIUM: 143 mmol/L (ref 135–145)
TCO2: 25 mmol/L (ref 22–32)

## 2017-09-18 LAB — COMPREHENSIVE METABOLIC PANEL
ALBUMIN: 4.1 g/dL (ref 3.5–5.0)
ALT: 19 U/L (ref 14–54)
ANION GAP: 8 (ref 5–15)
AST: 28 U/L (ref 15–41)
Alkaline Phosphatase: 84 U/L (ref 38–126)
BUN: 15 mg/dL (ref 6–20)
CALCIUM: 9.6 mg/dL (ref 8.9–10.3)
CO2: 25 mmol/L (ref 22–32)
CREATININE: 0.76 mg/dL (ref 0.44–1.00)
Chloride: 108 mmol/L (ref 101–111)
GFR calc Af Amer: >60 mL/min (ref >60)
GFR calc non Af Amer: >60 mL/min (ref >60)
GLUCOSE: 100 mg/dL — AB (ref 65–99)
Potassium: 3.9 mmol/L (ref 3.5–5.1)
SODIUM: 141 mmol/L (ref 135–145)
Total Bilirubin: 0.6 mg/dL (ref 0.3–1.2)
Total Protein: 7.7 g/dL (ref 6.5–8.1)

## 2017-09-18 LAB — URINALYSIS, ROUTINE W REFLEX MICROSCOPIC
Bacteria, UA: NONE SEEN
Bilirubin Urine: NEGATIVE
Glucose, UA: NEGATIVE mg/dL
Ketones, ur: 20 mg/dL — AB
Leukocytes, UA: NEGATIVE
Nitrite: NEGATIVE
Protein, ur: NEGATIVE mg/dL
Specific Gravity, Urine: 1.018 (ref 1.005–1.030)
pH: 5 (ref 5.0–8.0)

## 2017-09-18 LAB — CBC WITH DIFFERENTIAL/PLATELET
BASOS ABS: 0 10*3/uL (ref 0.0–0.1)
Basophils Relative: 0 %
EOS ABS: 0.1 10*3/uL (ref 0.0–0.7)
EOS PCT: 1 %
HCT: 42.1 % (ref 36.0–46.0)
HEMOGLOBIN: 13.8 g/dL (ref 12.0–15.0)
LYMPHS PCT: 15 %
Lymphs Abs: 2.2 10*3/uL (ref 0.7–4.0)
MCH: 30.3 pg (ref 26.0–34.0)
MCHC: 32.8 g/dL (ref 30.0–36.0)
MCV: 92.5 fL (ref 78.0–100.0)
Monocytes Absolute: 0.8 10*3/uL (ref 0.1–1.0)
Monocytes Relative: 5 %
NEUTROS PCT: 79 %
Neutro Abs: 12 10*3/uL — ABNORMAL HIGH (ref 1.7–7.7)
PLATELETS: 399 10*3/uL (ref 150–400)
RBC: 4.55 MIL/uL (ref 3.87–5.11)
RDW: 12.3 % (ref 11.5–15.5)
WBC: 15 10*3/uL — AB (ref 4.0–10.5)

## 2017-09-18 MED ORDER — SODIUM CHLORIDE 0.9 % IV BOLUS (SEPSIS)
1000.0000 mL | Freq: Once | INTRAVENOUS | Status: AC
  Administered 2017-09-18: 1000 mL via INTRAVENOUS

## 2017-09-18 MED ORDER — HYDROCODONE-ACETAMINOPHEN 5-325 MG PO TABS
1.0000 | ORAL_TABLET | Freq: Once | ORAL | Status: AC
  Administered 2017-09-18: 1 via ORAL
  Filled 2017-09-18: qty 1

## 2017-09-18 MED ORDER — ONDANSETRON HCL 4 MG PO TABS: 4.0000 mg | ORAL_TABLET | Freq: Three times a day (TID) | ORAL | 0 refills | Status: DC | PRN

## 2017-09-18 MED ORDER — HYDROCODONE-ACETAMINOPHEN 5-325 MG PO TABS: 1.0000 | ORAL_TABLET | ORAL | 0 refills | Status: DC | PRN

## 2017-09-18 MED ORDER — ONDANSETRON HCL 4 MG/2ML IJ SOLN
4.0000 mg | Freq: Once | INTRAMUSCULAR | Status: AC
  Administered 2017-09-18: 4 mg via INTRAVENOUS
  Filled 2017-09-18: qty 2

## 2017-09-18 NOTE — Discharge Instructions (Signed)
Please read and follow all provided instructions.  Your diagnoses today include:  1. Nephrolithiasis     Tests performed today include: Urine test that showed blood in your urine and no infection CT scan which showed a 3 millimeter kidney on the left side right before it gets to the bladder. Blood test that showed normal kidney function Vital signs. See below for your results today.   Medications prescribed:   Take any prescribed medications only as directed.  You have been prescribed Norco for pain. This is an opioid pain medication. You may take this medication every 4-6 hours as needed for pain. Only take this medication if you need it for breakthrough pain.   Do not combine this medication with Tylenol, as it may increase the risk of liver problems.  Do not combine this medication with alcohol.  Please be advised to avoid driving or operating heavy machinery while taking this medication, as it may make you drowsy or impair judgment.   Zofran is for nausea.  He may take this every 8 hours.  Home care instructions:  Follow any educational materials contained in this packet.  Please double your fluid intake for the next several days. Strain your urine and save any stones that may pass.   BE VERY CAREFUL not to take multiple medicines containing Tylenol (also called acetaminophen). Doing so can lead to an overdose which can damage your liver and cause liver failure and possibly death.   Follow-up instructions: Please follow-up with your urologist or the urologist referral (provided on front page) in the next 1 week for further evaluation of your symptoms.  If you need to return to the Emergency Department, go to [facility] and not [facility]. The urologists are located at [facility] and can better care for you at this location.  Return instructions:  If you need to return to the Emergency Department, go to [facility] and not [facility].  The urologists are located at [facility] and can better care for you at this location.  Please return to the Emergency Department if you experience worsening symptoms.  Please return if you develop fever or uncontrolled pain or vomiting. Please return if you have any other emergent concerns.  Additional Information:  Your vital signs today were: BP 111/67 (BP Location: Left Arm)    Pulse 91    Temp (!) 97.5 F (36.4 C) (Oral)    Resp 14    SpO2 98%  If your blood pressure (BP) was elevated above 135/85 this visit, please have this repeated by your doctor within one month. --------------

## 2017-09-18 NOTE — ED Provider Notes (Signed)
[facility] DEPT Provider Note   CSN: [id number] Arrival date & time: 09/18/17  1715     History   Chief Complaint Chief Complaint  Patient presents with  . Flank Pain    HPI Sheri [name] is a 66 y.o. female.  HPI  Patient is a 67 year old female with a history of A. fib with RVR, status post ablation, allergic rhinitis, COPD, anxiety, depression, and goiter presenting for left flank pain.  Abdominal surgical history significant for appendectomy, left oophorectomy.  Patient reports that the pain began suddenly after eating lunch today.  Patient reports that it is progressively worsening and sharp in nature throughout the day.  No aggravating or alleviating factors.  Patient reports that by the time she arrived home from work, pain was so severe that she called EMS.  Patient reports resolution of pain on arrival to the emergency department.  Patient denies any history of nephrolithiasis.  Patient denies any fevers, but did have "sweats" earlier today.  Patient reports 2 episodes of emesis this afternoon, but is not nauseous at present.  No dysuria, hematuria, but patient reports she has had urgency.   Past Medical History:  Diagnosis Date  . Allergic rhinitis   . Anxiety   . Atrial fibrillation with RVR ([place])   . Cancer ([place])    left lower eyelid, basal cell cancer  . Chronic sinusitis   . COPD (chronic obstructive pulmonary disease) ([place])   . DEGENERATIVE JOINT DISEASE, HANDS    several images revealing OA  . Depression    treated at [facility]  . GERD (gastroesophageal reflux disease)   . Goiter    hx of goiter  . Hx of atrial tachycardia since age 58 03/15/2013  . Palpitations    Associated with difficulty breathing  . Panic attacks   . Poisoning by selective serotonin reuptake inhibitors(969.03) Jan 2007   severe reaction with MAOI (nardil) and dextromethorphan  . Tachycardia     Patient Active Problem List   Diagnosis Date Noted   . Overweight (BMI 25.0-29.9) 09/04/2017  . Postmenopausal 12/31/2016  . Insomnia 12/31/2016  . Memory loss 12/31/2016  . Poor dentition 12/31/2016  . Plasma donor 11/05/2016  . Mnire's disease 03/07/2016  . Chiari I malformation ([place]) 03/07/2016  . AF (atrial fibrillation) ([place]) 10/12/2015  . Weight gain 09/21/2015  . Healthcare maintenance 04/14/2013  . Financial difficulties 09/09/2012  . Hearing loss 12/02/2006  . ANXIETY DISORDER, GENERALIZED 08/18/2006  . Depression 08/18/2006    Past Surgical History:  Procedure Laterality Date  . APPENDECTOMY    . COLONOSCOPY    . ELECTROPHYSIOLOGIC STUDY N/A 10/12/2015   Procedure: Atrial Fibrillation Ablation;  Surgeon: [name], MD;  Location: [facility];  Service: Cardiovascular;  Laterality: N/A;  . LID LESION EXCISION Left 08/06/2017   Procedure: EXCISION BIOPSY OF EYELID, RECONSTRUCTION OF EYELID;  Surgeon: [name], MD;  Location: [facility];  Service: Ophthalmology;  Laterality: Left;  . OVARIAN CYST REMOVAL  march 1969   left ovarian cystectomy  . right mastoidectomy     ET tube placement in 1980 and 1990  . right sinus surgery  october 2000  . THYROIDECTOMY, PARTIAL     nodule removal - April 1996    OB History    Gravida Para Term Preterm AB Living   4 3 3   1 3    SAB TAB Ectopic Multiple Live Births   1  Home Medications    Prior to Admission medications   Medication Sig Start Date End Date Taking? Authorizing Provider  potassium chloride (MICRO-K) 10 MEQ CR capsule Take 10 mEq by mouth daily.  08/24/17  Yes [provider]  propranolol (INDERAL) 10 MG tablet Take 1 tablet (10 mg total) by mouth 3 (three) times daily as needed. Patient taking differently: Take 10 mg by mouth 3 (three) times daily as needed (palpitations).  10/16/16  Yes Camnitz, [name], MD  QUEtiapine (SEROQUEL) 200 MG tablet Take 100 mg by mouth at bedtime.   Yes [provider]    traZODone (DESYREL) 100 MG tablet Take 100 mg by mouth at bedtime.   Yes [provider]  venlafaxine (EFFEXOR) 75 MG tablet Take 75 mg by mouth 3 (three) times daily with meals.  07/17/17  Yes [provider]  albuterol (PROVENTIL HFA;VENTOLIN HFA) 108 (90 Base) MCG/ACT inhaler Inhale 1-2 puffs into the lungs every 6 (six) hours as needed for wheezing or shortness of breath. 07/25/16   Barnet Glasgow, NP  Carboxymethylcellul-Glycerin (LUBRICATING EYE DROPS OP) Apply 1 drop to eye daily as needed (dry eyes).    [provider]  DM-Phenylephrine-Acetaminophen (ALKA-SELTZER PLS SINUS & COUGH PO) Take 2 tablets by mouth daily as needed (congestion).    [provider]  meclizine (ANTIVERT) 25 MG tablet TAKE ONE TABLET BY MOUTH THREE TIMES DAILY AS NEEDED FOR DIZZINESS Patient not taking: Reported on 07/29/2017 05/13/16   Norman Herrlich, MD  Multiple Vitamin (MULTIVITAMIN WITH MINERALS) TABS tablet Take 1 tablet by mouth daily.    [provider]    Family History Family History  Problem Relation Age of Onset  . Hypertension Mother   . Hypertension Father   . Psoriasis Father   . Heart attack Father   . Atrial fibrillation Sister        had a successful a fib ablation  . Atrial fibrillation Sister        had a successful a fib ablation    Social History Social History   Tobacco Use  . Smoking status: Never Smoker  . Smokeless tobacco: Never Used  Substance Use Topics  . Alcohol use: No    Alcohol/week: 0.0 oz  . Drug use: No     Allergies   Guaifenesin   Review of Systems Review of Systems  Constitutional: Negative for chills and fever.  HENT: Negative for congestion and rhinorrhea.   Eyes: Negative for visual disturbance.  Respiratory: Negative for cough, chest tightness and shortness of breath.   Cardiovascular: Negative for chest pain.  Gastrointestinal: Positive for abdominal pain, nausea and vomiting. Negative for blood in  stool and diarrhea.  Genitourinary: Positive for flank pain and urgency. Negative for decreased urine volume, difficulty urinating, dysuria and hematuria.  Musculoskeletal: Negative for back pain.  Skin: Negative for rash.  Neurological: Negative for weakness and headaches.     Physical Exam Updated Vital Signs BP (!) 114/95 (BP Location: Left Arm)   Pulse 86   Temp (!) 97.5 F (36.4 C) (Oral)   Resp 18   SpO2 100%   Physical Exam  Constitutional: She appears well-developed and well-nourished. No distress.  HENT:  Head: Normocephalic and atraumatic.  Mouth/Throat: Oropharynx is clear and moist.  Eyes: Conjunctivae and EOM are normal. Pupils are equal, round, and reactive to light.  Neck: Normal range of motion. Neck supple.  Cardiovascular: Normal rate, regular rhythm, S1 normal and S2 normal.  No murmur  heard. Pulmonary/Chest: Effort normal and breath sounds normal. She has no wheezes. She has no rales.  Abdominal: Soft. She exhibits no distension. There is tenderness. There is no guarding.  No CVA tenderness.  Tenderness extending around the left lower abdomen into the left lower quadrant and suprapubic region.  No guarding or rebound.  Tenderness resolves when abdomen not palpated.  Musculoskeletal: Normal range of motion. She exhibits no edema or deformity.  Lymphadenopathy:    She has no cervical adenopathy.  Neurological: She is alert.  Cranial nerves grossly intact. Patient moves extremities symmetrically and with good coordination.  Skin: Skin is warm and dry. No rash noted. No erythema.  Psychiatric: She has a normal mood and affect. Her behavior is normal. Judgment and thought content normal.  Nursing note and vitals reviewed.    ED Treatments / Results  Labs (all labs ordered are listed, but only abnormal results are displayed) Labs Reviewed  CBC WITH DIFFERENTIAL/PLATELET  COMPREHENSIVE METABOLIC PANEL  URINALYSIS, ROUTINE W REFLEX MICROSCOPIC  I-STAT CHEM  8, ED    EKG  EKG Interpretation None       Radiology Ct Renal Stone Study  Result Date: 09/18/2017 CLINICAL DATA:  Left flank pain, nausea/vomiting EXAM: CT ABDOMEN AND PELVIS WITHOUT CONTRAST TECHNIQUE: Multidetector CT imaging of the abdomen and pelvis was performed following the standard protocol without IV contrast. COMPARISON:  None. FINDINGS: Lower chest: Lung bases are essentially clear. Hepatobiliary: Unenhanced liver is grossly unremarkable. Gallbladder is unremarkable. No intrahepatic or extrahepatic ductal dilatation. Pancreas: Within normal limits. Spleen: Within normal limits. Adrenals/Urinary Tract: Adrenal glands are within normal limits. 2.2 cm left upper pole renal cyst (series 2/image 23). Right kidney is within normal limits. No renal calculi or hydronephrosis. 3 mm distal left ureteral calculus just above the UVJ (coronal image 77). Bladder is underdistended but unremarkable. Stomach/Bowel: Stomach is within normal limits. No evidence of bowel obstruction. Appendix is not discretely visualized. Vascular/Lymphatic: No evidence of abdominal aortic aneurysm. No suspicious abdominopelvic lymphadenopathy. Reproductive: Uterus is within normal limits. No adnexal masses. Other: No abdominopelvic ascites. Postsurgical changes involving the anterior abdominal wall. Musculoskeletal: No abdominopelvic ascites. IMPRESSION: 3 mm distal left ureteral calculus just above the UVJ. No hydronephrosis. Additional ancillary findings as above. Electronically Signed   By: Julian Hy M.D.   On: 09/18/2017 18:55    Procedures Procedures (including critical care time)  Medications Ordered in ED Medications  sodium chloride 0.9 % bolus 1,000 mL (not administered)     Initial Impression / Assessment and Plan / ED Course  I have reviewed the triage vital signs and the nursing notes.  Pertinent labs & imaging results that were available during my care of the patient were reviewed by me and  considered in my medical decision making (see chart for details).     Patient is nontoxic-appearing, afebrile, and in no acute distress at this time.  Patient is declining any analgesia at this time.  Differential diagnosis includes nephrolithiasis, pyelonephritis, diverticulitis.  Do not suspect pelvic pathology as this time, as patient has left oophorectomy.  Will order CT renal stone study, CBC, BMP, and urinalysis.  CT renal stone study is demonstrating a 3 mm stone just above the UVJ.  We will plan to assess lab work and urinalysis, and outpatient follow-up with urology.  Anticipate patient will be able to be discharged on symptomatic therapy.   Lab work is demonstrating no evidence of infection in the urine.  Kidney function is normal.  Otherwise unremarkable  laboratory analysis.  I have reviewed the patient's information in the [facility] for the past 12 months and found them to have no overlapping narcotic Rx.  8:45 PM Patient reevaluated and continues to have no pain from nephrolithiasis.  Patient is requesting analgesia and antiemetics prior to discharge.  Opiates were prescribed for an acute, painful condition. The patient was given information on side effects and encouraged to use other, non-opiate pain medication primary, only using opiate medicine sparingly for severe pain.  Patient instructed to follow-up with urology and return to the emergency department if she has any intractable nausea or vomiting, worsening pain, persistent pain that is not controlled, or fever or chills.  Patient is in understanding and agrees with the plan of care.  This is a shared visit with Dr. Duffy Bruce. Patient was independently evaluated by this attending physician. Attending physician consulted in evaluation and discharge management.  Final Clinical Impressions(s) / ED Diagnoses   Final diagnoses:  Nephrolithiasis    ED Discharge Orders    None         Tamala Julian 09/18/17 2046    Duffy Bruce, MD 09/19/17 [phone]

## 2017-09-18 NOTE — ED Triage Notes (Signed)
Per EMS, pt is coming from home with complaints of left flank pain that radiates to her back and her groin. Pt reports having n/v and denies diarrhea. EMS reports unremarkable EKG. Pt ambulated to the ambulance and is AO x4.

## 2017-09-18 NOTE — ED Notes (Signed)
Bed: LY59 Expected date:  Expected time:  Means of arrival:  Comments: 67 yo flank pain

## 2017-09-20 LAB — URINE CULTURE

## 2017-12-16 ENCOUNTER — Encounter (HOSPITAL_COMMUNITY): Payer: Self-pay | Admitting: Emergency Medicine

## 2017-12-16 ENCOUNTER — Other Ambulatory Visit: Payer: Self-pay | Admitting: Cardiology

## 2017-12-16 ENCOUNTER — Ambulatory Visit (HOSPITAL_COMMUNITY)
Admission: EM | Admit: 2017-12-16 | Discharge: 2017-12-16 | Disposition: A | Discharge status: Home / Self Care | Payer: Medicare HMO | Attending: Family Medicine | Admitting: Family Medicine

## 2017-12-16 DIAGNOSIS — J40 Bronchitis, not specified as acute or chronic: Secondary | ICD-10-CM | POA: Diagnosis not present

## 2017-12-16 MED ORDER — METHYLPREDNISOLONE ACETATE 80 MG/ML IJ SUSP
INTRAMUSCULAR | Status: AC
  Filled 2017-12-16: qty 1

## 2017-12-16 MED ORDER — BENZONATATE 100 MG PO CAPS: 100.0000 mg | ORAL_CAPSULE | Freq: Three times a day (TID) | ORAL | 0 refills | Status: DC | PRN

## 2017-12-16 MED ORDER — ALBUTEROL SULFATE HFA 108 (90 BASE) MCG/ACT IN AERS: 1.0000 | INHALATION_SPRAY | Freq: Four times a day (QID) | RESPIRATORY_TRACT | 0 refills | Status: DC | PRN

## 2017-12-16 MED ORDER — METHYLPREDNISOLONE ACETATE 80 MG/ML IJ SUSP
80.0000 mg | Freq: Once | INTRAMUSCULAR | Status: AC
  Administered 2017-12-16: 80 mg via INTRAMUSCULAR

## 2017-12-16 NOTE — Discharge Instructions (Addendum)
With the medicine you have been prescribed, we are expecting you to get significant improvement over the next 24 hours and be able to go back to work.  It may take several days to completely resolve the cough.

## 2017-12-16 NOTE — ED Provider Notes (Signed)
[facility]   [id number] 12/16/17 Arrival Time: 6160   SUBJECTIVE:  Sheri [name] is a 68 y.o. female who presents to the urgent care with complaint of cough with URI sx x several days   Patient has had a sore throat which is improving.  She is also had some pressure in her sinus areas.  The cough has been the most difficult symptom to deal with.  She has had this before and used an inhaler.  Currently, the cough is productive of white phlegm.  Patient has had no fever or shortness of breath.  She denies chest pain.  There is been no vomiting or GI symptoms.  Patient denies any history of smoking.  Patient works at the [employer] on [address].  Past Medical History:  Diagnosis Date  . Allergic rhinitis   . Anxiety   . Atrial fibrillation with RVR ([place])   . Cancer ([place])    left lower eyelid, basal cell cancer  . Chronic sinusitis   . COPD (chronic obstructive pulmonary disease) ([place])   . DEGENERATIVE JOINT DISEASE, HANDS    several images revealing OA  . Depression    treated at [facility]  . GERD (gastroesophageal reflux disease)   . Goiter    hx of goiter  . Hx of atrial tachycardia since age 67 03/15/2013  . Palpitations    Associated with difficulty breathing  . Panic attacks   . Poisoning by selective serotonin reuptake inhibitors(969.03) Jan 2007   severe reaction with MAOI (nardil) and dextromethorphan  . Tachycardia    Family History  Problem Relation Age of Onset  . Hypertension Mother   . Hypertension Father   . Psoriasis Father   . Heart attack Father   . Atrial fibrillation Sister        had a successful a fib ablation  . Atrial fibrillation Sister        had a successful a fib ablation   Social History   Socioeconomic History  . Marital status: Widowed    Spouse name: Not on file  . Number of children: Not on file  . Years of education: Not on file  . Highest education level: Not on file  Occupational History  .  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 Financial resource strain: Not on file  . Food insecurity:    Worry: Not on file    Inability: Not on file  . Transportation needs:    Medical: Not on file    Non-medical: Not on file  Tobacco Use  . Smoking status: Never Smoker  . Smokeless tobacco: Never Used  Substance and Sexual Activity  . Alcohol use: No    Alcohol/week: 0.0 oz  . Drug use: No  . Sexual activity: Not on file  Lifestyle  . Physical activity:    Days per week: Not on file    Minutes per session: Not on file  . Stress: Not on file  Relationships  . Social connections:    Talks on phone: Not on file    Gets together: Not on file    Attends religious service: Not on file    Active member of club or organization: Not on file    Attends meetings of clubs or organizations: Not on file    Relationship status: Not on file  . Intimate partner violence:    Fear of current or ex partner:  Not on file    Emotionally abused: Not on file    Physically abused: Not on file    Forced sexual activity: Not on file  Other Topics Concern  . Not on file  Social History Narrative   Lives alone.   No outpatient medications have been marked as taking for the 12/16/17 encounter [facility] Encounter).   Allergies  Allergen Reactions  . Guaifenesin Palpitations      ROS: As per HPI, remainder of ROS negative.   OBJECTIVE:   Vitals:   12/16/17 1615  BP: (!) 122/56  Pulse: 84  Resp: 18  Temp: 98.7 F (37.1 C)  TempSrc: Oral  SpO2: 96%     General appearance: alert; no distress Eyes: PERRL; EOMI; conjunctiva normal HENT: normocephalic; atraumatic; TMs normal, canal normal, external ears normal without trauma; nasal mucosa normal; oral mucosa normal Neck: supple Lungs: Rhonchi on auscultation bilaterally Heart: regular rate and rhythm Back: no CVA tenderness Extremities: no cyanosis or edema; symmetrical with no gross deformities Skin:  warm and dry Neurologic: normal gait; grossly normal Psychological: alert and cooperative; normal mood and affect      Labs:  Results for orders placed or performed during the hospital encounter of 09/18/17  Urine culture  Result Value Ref Range   Specimen Description      URINE, RANDOM Performed at [facility], [place] [address], [place], [place] [address]    Special Requests      NONE Performed at [facility], [place] [address] [place], [place] [address]    Culture MULTIPLE SPECIES PRESENT, SUGGEST RECOLLECTION (A)    Report Status 09/20/2017 FINAL   CBC with Differential  Result Value Ref Range   WBC 15.0 (H) 4.0 - 10.5 K/uL   RBC 4.55 3.87 - 5.11 MIL/uL   Hemoglobin 13.8 12.0 - 15.0 g/dL   HCT 42.1 36.0 - 46.0 %   MCV 92.5 78.0 - 100.0 fL   MCH 30.3 26.0 - 34.0 pg   MCHC 32.8 30.0 - 36.0 g/dL   RDW 12.3 11.5 - 15.5 %   Platelets 399 150 - 400 K/uL   Neutrophils Relative % 79 %   Neutro Abs 12.0 (H) 1.7 - 7.7 K/uL   Lymphocytes Relative 15 %   Lymphs Abs 2.2 0.7 - 4.0 K/uL   Monocytes Relative 5 %   Monocytes Absolute 0.8 0.1 - 1.0 K/uL   Eosinophils Relative 1 %   Eosinophils Absolute 0.1 0.0 - 0.7 K/uL   Basophils Relative 0 %   Basophils Absolute 0.0 0.0 - 0.1 K/uL  Comprehensive metabolic panel  Result Value Ref Range   Sodium 141 135 - 145 mmol/L   Potassium 3.9 3.5 - 5.1 mmol/L   Chloride 108 101 - 111 mmol/L   CO2 25 22 - 32 mmol/L   Glucose, Bld 100 (H) 65 - 99 mg/dL   BUN 15 6 - 20 mg/dL   Creatinine, Ser 0.76 0.44 - 1.00 mg/dL   Calcium 9.6 8.9 - 10.3 mg/dL   Total Protein 7.7 6.5 - 8.1 g/dL   Albumin 4.1 3.5 - 5.0 g/dL   AST 28 15 - 41 U/L   ALT 19 14 - 54 U/L   Alkaline Phosphatase 84 38 - 126 U/L   Total Bilirubin 0.6 0.3 - 1.2 mg/dL   GFR calc non Af Amer >60 >60 mL/min   GFR calc Af Amer >60 >60 mL/min   Anion gap 8 5 - 15  Urinalysis,  Routine w reflex microscopic  Result Value Ref Range   Color,  Urine YELLOW YELLOW   APPearance HAZY (A) CLEAR   Specific Gravity, Urine 1.018 1.005 - 1.030   pH 5.0 5.0 - 8.0   Glucose, UA NEGATIVE NEGATIVE mg/dL   Hgb urine dipstick LARGE (A) NEGATIVE   Bilirubin Urine NEGATIVE NEGATIVE   Ketones, ur 20 (A) NEGATIVE mg/dL   Protein, ur NEGATIVE NEGATIVE mg/dL   Nitrite NEGATIVE NEGATIVE   Leukocytes, UA NEGATIVE NEGATIVE   RBC / HPF TOO NUMEROUS TO COUNT 0 - 5 RBC/hpf   WBC, UA 0-5 0 - 5 WBC/hpf   Bacteria, UA NONE SEEN NONE SEEN   Squamous Epithelial / LPF 0-5 (A) NONE SEEN   Mucus PRESENT    Hyaline Casts, UA PRESENT   I-stat Chem 8, ED  Result Value Ref Range   Sodium 143 135 - 145 mmol/L   Potassium 3.8 3.5 - 5.1 mmol/L   Chloride 106 101 - 111 mmol/L   BUN 14 6 - 20 mg/dL   Creatinine, Ser 0.70 0.44 - 1.00 mg/dL   Glucose, Bld 98 65 - 99 mg/dL   Calcium, Ion 1.17 1.15 - 1.40 mmol/L   TCO2 25 22 - 32 mmol/L   Hemoglobin 14.6 12.0 - 15.0 g/dL   HCT 43.0 36.0 - 46.0 %    Labs Reviewed - No data to display  No results found.     ASSESSMENT & PLAN:  1. Bronchitis   With the medicine you have been prescribed, we are expecting you to get significant improvement over the next 24 hours and be able to go back to work.  It may take several days to completely resolve the cough.   Meds ordered this encounter  Medications  . methylPREDNISolone acetate (DEPO-MEDROL) injection 80 mg  . albuterol (PROVENTIL HFA;VENTOLIN HFA) 108 (90 Base) MCG/ACT inhaler    Sig: Inhale 1-2 puffs into the lungs every 6 (six) hours as needed for wheezing or shortness of breath.    Dispense:  1 Inhaler    Refill:  0  . benzonatate (TESSALON) 100 MG capsule    Sig: Take 1-2 capsules (100-200 mg total) by mouth 3 (three) times daily as needed for cough.    Dispense:  20 capsule    Refill:  0    Reviewed expectations re: course of current medical issues. Questions answered. Outlined signs and symptoms indicating need for more acute  intervention. Patient verbalized understanding. After Visit Summary given.    Procedures:      Robyn Haber, MD 12/16/17 1645

## 2017-12-16 NOTE — Telephone Encounter (Signed)
Please advise on refill request as patient has not been seen since 2017. Thanks, MI

## 2017-12-16 NOTE — ED Triage Notes (Signed)
Pt sts cough with URI sx x several days

## 2017-12-17 NOTE — Telephone Encounter (Signed)
Do not refill.  Pt will need to make an appointment if she needs refills.

## 2017-12-24 ENCOUNTER — Other Ambulatory Visit: Payer: Self-pay | Admitting: Cardiology

## 2017-12-25 ENCOUNTER — Telehealth: Payer: Self-pay | Admitting: Cardiology

## 2017-12-25 MED ORDER — PROPRANOLOL HCL 10 MG PO TABS: 10.0000 mg | ORAL_TABLET | Freq: Three times a day (TID) | ORAL | 0 refills | Status: DC | PRN

## 2017-12-25 NOTE — Telephone Encounter (Signed)
Sheri [name] spoke to patient.  Patient is having frequent palpitations/Afib. Approved 10 tablets for pt until her appt on 7/3. Pt aware she will need to keep this appt for further refills.

## 2017-12-25 NOTE — Telephone Encounter (Signed)
Pt has not been seen since 03/2016. She will need to be seen, prior to refills being filled, to address need for continued medication/refills.

## 2017-12-25 NOTE — Telephone Encounter (Signed)
New message     *STAT* If patient is at the pharmacy, call can be transferred to refill team.   1. Which medications need to be refilled? (please list name of each medication and dose if known) propranolol (INDERAL) 10 MG tablet  2. Which pharmacy/location (including street and city if local pharmacy) is medication to be sent to?[place], [place] - [address]  3. Do they need a 30 day or 90 day supply? [place]

## 2018-01-19 ENCOUNTER — Ambulatory Visit: Payer: Medicare HMO | Admitting: Cardiology

## 2018-01-19 ENCOUNTER — Encounter: Payer: Self-pay | Admitting: Cardiology

## 2018-01-19 VITALS — BP 120/76 | HR 93 | Ht 65.5 in | Wt 135.6 lb

## 2018-01-19 DIAGNOSIS — I480 Paroxysmal atrial fibrillation: Secondary | ICD-10-CM | POA: Diagnosis not present

## 2018-01-19 MED ORDER — PROPRANOLOL HCL 10 MG PO TABS: 10.0000 mg | ORAL_TABLET | Freq: Two times a day (BID) | ORAL | 1 refills | Status: DC

## 2018-01-19 NOTE — Patient Instructions (Signed)
Medication Instructions:  Your physician recommends that you continue on your current medications as directed. Please refer to the Current Medication list given to you today.  Labwork: None ordered  Testing/Procedures: None ordered  Follow-Up: Your physician wants you to follow-up in: 1 year with Dr. Camnitz.  You will receive a reminder letter in the mail two months in advance. If you don't receive a letter, please call our office to schedule the follow-up appointment.  * If you need a refill on your cardiac medications before your next appointment, please call your pharmacy.   *Please note that any paperwork needing to be filled out by the provider will need to be addressed at the front desk prior to seeing the provider. Please note that any FMLA, disability or other documents regarding health condition is subject to a $25.00 charge that must be received prior to completion of paperwork in the form of a money order or check.  Thank you for choosing [facility]!!   Rodrigo Mcgranahan, RN [phone]        

## 2018-01-19 NOTE — Progress Notes (Signed)
Electrophysiology Office Note   Date:  01/19/2018   ID:  Sheri [name], DOB 1950-10-09, MRN [id number]  PCP:  Sid Falcon, MD  Cardiologist:  Debara Pickett Primary Electrophysiologist:  Elzada Pytel [name], MD    Chief Complaint  Patient presents with  . Follow-up    PAF     History of Present Illness: Sheri [name] is a 67 y.o. female who presents today for electrophysiology evaluation.   She has a history of atrial fibrillation.    She admitted in 02/2013 for A fib with RVR she was placed on procainamide drip. She then converted to SR. She had echo done which was generally normal. She does report 2 of her sisters with the same disorder - they have both undergone a-fib ablations which have successfully terminated their arrhythmias. She is treated for anxiety and panic attacks with seroquel, trazadone, effexor and xanax.  He had an atrial fibrillation ablation 10/12/2015.  Today, denies symptoms of palpitations, chest pain, shortness of breath, orthopnea, PND, lower extremity edema, claudication, dizziness, presyncope, syncope, bleeding, or neurologic sequela. The patient is tolerating medications without difficulties.  Overall she has been feeling well.  She has had episodic palpitations that have occurred approximately once a month.  Her palpitations last up to a minute.  She takes propranolol which she says helps with her symptoms.   Past Medical History:  Diagnosis Date  . Allergic rhinitis   . Anxiety   . Atrial fibrillation with RVR ([place])   . Cancer ([place])    left lower eyelid, basal cell cancer  . Chronic sinusitis   . COPD (chronic obstructive pulmonary disease) ([place])   . DEGENERATIVE JOINT DISEASE, HANDS    several images revealing OA  . Depression    treated at [facility]  . GERD (gastroesophageal reflux disease)   . Goiter    hx of goiter  . Hx of atrial tachycardia since age 76 03/15/2013  . Palpitations    Associated with difficulty breathing  . Panic attacks     . Poisoning by selective serotonin reuptake inhibitors(969.03) Jan 2007   severe reaction with MAOI (nardil) and dextromethorphan  . Tachycardia    Past Surgical History:  Procedure Laterality Date  . APPENDECTOMY    . COLONOSCOPY    . ELECTROPHYSIOLOGIC STUDY N/A 10/12/2015   Procedure: Atrial Fibrillation Ablation;  Surgeon: Eber Ferrufino [name], MD;  Location: [facility];  Service: Cardiovascular;  Laterality: N/A;  . LID LESION EXCISION Left 08/06/2017   Procedure: EXCISION BIOPSY OF EYELID, RECONSTRUCTION OF EYELID;  Surgeon: [name], MD;  Location: [facility];  Service: Ophthalmology;  Laterality: Left;  . OVARIAN CYST REMOVAL  march 1969   left ovarian cystectomy  . right mastoidectomy     ET tube placement in 1980 and 1990  . right sinus surgery  october 2000  . THYROIDECTOMY, PARTIAL     nodule removal - April 1996     Current Outpatient Medications  Medication Sig Dispense Refill  . albuterol (PROVENTIL HFA;VENTOLIN HFA) 108 (90 Base) MCG/ACT inhaler Inhale 1-2 puffs into the lungs every 6 (six) hours as needed for wheezing or shortness of breath. 1 Inhaler 0  . Multiple Vitamin (MULTIVITAMIN WITH MINERALS) TABS tablet Take 1 tablet by mouth daily.    . potassium chloride (MICRO-K) 10 MEQ CR capsule Take 10 mEq by mouth daily.     . propranolol (INDERAL) 10 MG tablet Take 1 tablet (10 mg total) by mouth 2 (  two) times daily. PT MUST KEEP HER APPT 01/20/18 FOR ADDITIONAL REFILLS. 90 tablet 1  . QUEtiapine (SEROQUEL) 200 MG tablet Take 100 mg by mouth at bedtime.    . traZODone (DESYREL) 100 MG tablet Take 100 mg by mouth at bedtime.    Marland Kitchen venlafaxine (EFFEXOR) 75 MG tablet Take 75 mg by mouth 3 (three) times daily with meals.      No current facility-administered medications for this visit.     Allergies:   Guaifenesin   Social History:  The patient  reports that she has never smoked. She has never used smokeless tobacco. She reports that she does not drink  alcohol or use drugs.   Family History:  The patient's family history includes Atrial fibrillation in her sister and sister; Heart attack in her father; Hypertension in her father and mother; Psoriasis in her father.    ROS:  Please see the history of present illness.   Otherwise, review of systems is positive for weight loss, leg pain.   All other systems are reviewed and negative.   PHYSICAL EXAM: VS:  BP 120/76   Pulse 93   Ht 5' 5.5" (1.664 m)   Wt 135 lb 9.6 oz (61.5 kg)   SpO2 96%   BMI 22.22 kg/m  , BMI Body mass index is 22.22 kg/m. GEN: Well nourished, well developed, in no acute distress  HEENT: normal  Neck: no JVD, carotid bruits, or masses Cardiac: RRR; no murmurs, rubs, or gallops,no edema  Respiratory:  clear to auscultation bilaterally, normal work of breathing GI: soft, nontender, nondistended, + BS MS: no deformity or atrophy  Skin: warm and dry Neuro:  Strength and sensation are intact Psych: euthymic mood, full affect  EKG:  EKG is ordered today. Personal review of the ekg ordered shows SR, rate 93  Recent Labs: 09/18/2017: ALT 19; BUN 14; Creatinine, Ser 0.70; Hemoglobin 14.6; Platelets 399; Potassium 3.8; Sodium 143    Lipid Panel     Component Value Date/Time   CHOL 111 03/16/2013 0355   TRIG 70 03/16/2013 0355   HDL 39 (L) 03/16/2013 0355   CHOLHDL 2.8 03/16/2013 0355   VLDL 14 03/16/2013 0355   LDLCALC 58 03/16/2013 0355     Wt Readings from Last 3 Encounters:  01/19/18 135 lb 9.6 oz (61.5 kg)  08/06/17 148 lb (67.1 kg)  12/31/16 158 lb 6.4 oz (71.8 kg)      Other studies Reviewed: Additional studies/ records that were reviewed today include: TTE 2014  Review of the above records today demonstrates:  - Left ventricle: The cavity size was normal. Wall thickness was increased in a pattern of mild LVH. Systolic function was normal. The estimated ejection fraction was in the range of 55% to 60%. Wall motion was normal; there were  no regional wall motion abnormalities. Left ventricular diastolic function parameters were normal. - Left atrium: The atrium was normal in size. - Tricuspid valve: Poorly visualized. Trivial regurgitation. - Pulmonary arteries: PA peak pressure: 15mm Hg (S). - Inferior vena cava: The vessel was normal in size; the respirophasic diameter changes were in the normal range  50%); findings are consistent with normal central venous pressure.   ASSESSMENT AND PLAN:  1.  Paroxysmal atrial fibrillation: Status post atrial fibrillation ablation 10/12/2015.  She has not had follow-up since that time.  She is back today requesting propranolol and she has had minimal palpitations since then.  She feels the tramadol as needed helps.  We Sheri [name] give  her prescription for this medication.  No other changes.  I told her that if she has more atrial fibrillation, that she would benefit from potential rhythm control or anticoagulation.  This patients CHA2DS2-VASc Score and unadjusted Ischemic Stroke Rate (% per year) is equal to 2.2 % stroke rate/year from a score of 2  Above score calculated as 1 point each if present [CHF, HTN, DM, Vascular=MI/PAD/Aortic Plaque, Age if 65-74, or Female] Above score calculated as 2 points each if present [Age > 75, or Stroke/TIA/TE]   Current medicines are reviewed at length with the patient today.   The patient does not have concerns regarding her medicines.  The following changes were made today:  none  Labs/ tests ordered today include:  Orders Placed This Encounter  Procedures  . EKG 12-Lead     Disposition:   FU with Lilly Gasser 12 months  Signed, Larysa Pall [name], MD  01/19/2018 5:03 PM     [place] [address] [phone] (office) [phone] (fax)

## 2018-01-20 ENCOUNTER — Ambulatory Visit: Payer: Medicare HMO

## 2018-01-20 ENCOUNTER — Ambulatory Visit: Payer: Medicare HMO | Admitting: Cardiology

## 2018-01-26 ENCOUNTER — Other Ambulatory Visit: Payer: Self-pay

## 2018-01-26 ENCOUNTER — Ambulatory Visit (INDEPENDENT_AMBULATORY_CARE_PROVIDER_SITE_OTHER): Payer: Medicare HMO | Admitting: Internal Medicine

## 2018-01-26 ENCOUNTER — Encounter: Payer: Self-pay | Admitting: Internal Medicine

## 2018-01-26 VITALS — BP 116/67 | HR 92 | Temp 98.5°F | Ht 65.0 in | Wt 134.4 lb

## 2018-01-26 DIAGNOSIS — R634 Abnormal weight loss: Secondary | ICD-10-CM

## 2018-01-26 DIAGNOSIS — Z0000 Encounter for general adult medical examination without abnormal findings: Secondary | ICD-10-CM

## 2018-01-26 DIAGNOSIS — M1990 Unspecified osteoarthritis, unspecified site: Secondary | ICD-10-CM

## 2018-01-26 DIAGNOSIS — Z79899 Other long term (current) drug therapy: Secondary | ICD-10-CM

## 2018-01-26 DIAGNOSIS — J449 Chronic obstructive pulmonary disease, unspecified: Secondary | ICD-10-CM

## 2018-01-26 DIAGNOSIS — I4891 Unspecified atrial fibrillation: Secondary | ICD-10-CM

## 2018-01-26 DIAGNOSIS — F419 Anxiety disorder, unspecified: Secondary | ICD-10-CM

## 2018-01-26 DIAGNOSIS — Z6822 Body mass index (BMI) 22.0-22.9, adult: Secondary | ICD-10-CM | POA: Diagnosis not present

## 2018-01-26 DIAGNOSIS — F339 Major depressive disorder, recurrent, unspecified: Secondary | ICD-10-CM

## 2018-01-26 DIAGNOSIS — Z803 Family history of malignant neoplasm of breast: Secondary | ICD-10-CM

## 2018-01-26 DIAGNOSIS — M810 Age-related osteoporosis without current pathological fracture: Secondary | ICD-10-CM | POA: Insufficient documentation

## 2018-01-26 DIAGNOSIS — Z1231 Encounter for screening mammogram for malignant neoplasm of breast: Secondary | ICD-10-CM | POA: Insufficient documentation

## 2018-01-26 NOTE — Assessment & Plan Note (Signed)
-   Pt has never done a mammogram - Family history of breast cancer in niece but no other female relatives - Report no mass or nodules on self exam - Order for mammogram

## 2018-01-26 NOTE — Patient Instructions (Signed)
Dear Sheri [name]  You came to [place] with complaints of unintentional weight loss. We could not find a clear reason for the weight loss so we are ordering multiple blood tests. Also we want to screen for the most common cancers including colon and breast cancers so we will make referrals to be screened for those. Also you are over 65 so we will send you in for a bone scan to look for osteoporosis. Thank you for your visit.

## 2018-01-26 NOTE — Assessment & Plan Note (Addendum)
-   She is overdue for colon screening - Last colon screening in 2009 show no abnormalities, recommend repeat in 10 years - Referral to GI for colonoscopy

## 2018-01-26 NOTE — Assessment & Plan Note (Signed)
-  Unintentional weight loss from 07/2017 to now - 154lbs to 134 lbs in last 6 months: ~13% of usual body weight loss  - Describes no significant change in diet, appetite or eating habits - No history of eating disorders. Depression and anxiety diagnosis well managed with medication. - PHQ-9 show score of 7: mild depression. Managed by Seroquel at home - Is overdue for multiple cancer screenings: colonoscopy, mammogram - Describes family history of multiple lung cancers and 1 breast cancer in niece - Denies any fever, nausea, vomiting, diarrhea, constipation, fatigue, hemoptysis, hematochezia, melena, heat intolerance, palpitations  - Wide differential due to lack of recent lab results and lack of ancillary symptoms - Will perform overdue cancer screenings to r/o common malignancies - Will perform tests to r/o possible medical conditions that can cause weight loss: ESR/CRP, CMP, HIV, Hgb A1c, Chest X-ray, CBC, TSH

## 2018-01-26 NOTE — Progress Notes (Signed)
CC: Unintentional weight loss  HPI: [name] is a 67 y.o. F w/ PMH of A.Fib w/ RVR s/p ablation, COPD, osteoarthritis, anxiety and depression coming to the clinic w/ complaints of unintentional weight loss. She states she noticed that her weight has significantly decreased in the last 6 months. She was ~155 pounds in January of this year but her most recent weight shows 20 pound weight loss in the last 6 months. She has had no change in appetite or diet except she feels she is eating less home cooked meals and eating more junk food, especially microwave meals. She reports that she is concerned because her brother was diagnosed with cancer recently and she is afraid that she may have a serious disease causing her weight loss. She denies any F/N/V/D/C/ fatigue/ heat intolerance [name] /cough /palpitations.  Past Medical History:  Diagnosis Date  . Allergic rhinitis   . Anxiety   . Atrial fibrillation with RVR ([place])   . Cancer ([place])    left lower eyelid, basal cell cancer  . Chronic sinusitis   . COPD (chronic obstructive pulmonary disease) ([place])   . DEGENERATIVE JOINT DISEASE, HANDS    several images revealing OA  . Depression    treated at [facility]  . GERD (gastroesophageal reflux disease)   . Goiter    hx of goiter  . Hx of atrial tachycardia since age 68 03/15/2013  . Palpitations    Associated with difficulty breathing  . Panic attacks   . Poisoning by selective serotonin reuptake inhibitors(969.03) Jan 2007   severe reaction with MAOI (nardil) and dextromethorphan  . Tachycardia     Review of Systems: Review of Systems  Constitutional: Positive for weight loss. Negative for chills, diaphoresis, fever and malaise/fatigue.  HENT: Negative for congestion, ear pain, hearing loss, nosebleeds and sore throat.   Eyes: Negative for blurred vision, double vision, photophobia and pain.  Respiratory: Negative for cough, hemoptysis and shortness of  breath.   Cardiovascular: Negative for chest pain, palpitations, orthopnea and leg swelling.  Gastrointestinal: Negative for abdominal pain, blood in stool, constipation, diarrhea, melena, nausea and vomiting.  Genitourinary: Negative for dysuria, frequency, hematuria and urgency.  Musculoskeletal: Negative for back pain, joint pain and neck pain.  Skin: Negative for itching and rash.  Neurological: Negative for dizziness, sensory change, weakness and headaches.  Endo/Heme/Allergies: Negative for polydipsia. Does not bruise/bleed easily.  Psychiatric/Behavioral: Negative for depression and substance abuse. The patient is not nervous/anxious and does not have insomnia.      Physical Exam: Vitals:   01/26/18 1542  BP: 116/67  Pulse: 92  Temp: 98.5 F (36.9 C)  TempSrc: Oral  SpO2: 98%  Weight: 134 lb 6.4 oz (61 kg)  Height: 5\' 5"  (1.651 m)    Physical Exam  Constitutional: She is oriented to person, place, and time. She appears well-developed and well-nourished. No distress.  HENT:  Head: Atraumatic.  Mouth/Throat: Oropharynx is clear and moist. No oropharyngeal exudate.  Eyes: Pupils are equal, round, and reactive to light. Conjunctivae and EOM are normal. No scleral icterus.  Neck: Normal range of motion. Neck supple. No JVD present. No thyromegaly present.  Cardiovascular: Normal rate, normal heart sounds and intact distal pulses.  Respiratory: Effort normal and breath sounds normal. No respiratory distress. She has no wheezes.  GI: Soft. Bowel sounds are normal. She exhibits no distension and no mass. There is no guarding.  Musculoskeletal: Normal range of motion. She exhibits no edema, tenderness  or deformity.  Lymphadenopathy:    She has no cervical adenopathy.  Neurological: She is alert and oriented to person, place, and time. She has normal reflexes. Coordination normal.  Skin: Skin is warm and dry. No rash noted. She is not diaphoretic. No erythema.  Psychiatric: She  has a normal mood and affect. Her behavior is normal. Judgment and thought content normal.    Assessment & Plan:   See Encounters Tab for problem based charting.  Patient seen with Dr. Rebeca Alert   -Gilberto Better, PGY1

## 2018-01-26 NOTE — Assessment & Plan Note (Signed)
-   Patient is over 67 years old - No history of falls - Due for routine screening DEXA scan for osteoporosis - Will put in order for DEXA

## 2018-01-27 ENCOUNTER — Telehealth: Payer: Self-pay | Admitting: Internal Medicine

## 2018-01-27 LAB — CMP14 + ANION GAP
ALK PHOS: 82 IU/L (ref 39–117)
ALT: 12 IU/L (ref 0–32)
AST: 15 IU/L (ref 0–40)
Albumin/Globulin Ratio: 1.6 (ref 1.2–2.2)
Albumin: 4 g/dL (ref 3.6–4.8)
Anion Gap: 13 mmol/L (ref 10.0–18.0)
BUN/Creatinine Ratio: 15 (ref 12–28)
BUN: 13 mg/dL (ref 8–27)
Bilirubin Total: 0.3 mg/dL (ref 0.0–1.2)
CHLORIDE: 105 mmol/L (ref 96–106)
CO2: 25 mmol/L (ref 20–29)
Calcium: 9 mg/dL (ref 8.7–10.3)
Creatinine, Ser: 0.84 mg/dL (ref 0.57–1.00)
GFR calc Af Amer: 83 mL/min/{1.73_m2} (ref >59)
GFR calc non Af Amer: 72 mL/min/{1.73_m2} (ref >59)
GLUCOSE: 90 mg/dL (ref 65–99)
Globulin, Total: 2.5 g/dL (ref 1.5–4.5)
Potassium: 4.4 mmol/L (ref 3.5–5.2)
Sodium: 143 mmol/L (ref 134–144)
TOTAL PROTEIN: 6.5 g/dL (ref 6.0–8.5)

## 2018-01-27 LAB — CBC WITH DIFFERENTIAL/PLATELET
BASOS ABS: 0 10*3/uL (ref 0.0–0.2)
BASOS: 0 %
EOS (ABSOLUTE): 0.1 10*3/uL (ref 0.0–0.4)
Eos: 1 %
Hematocrit: 39 % (ref 34.0–46.6)
Hemoglobin: 13 g/dL (ref 11.1–15.9)
Immature Grans (Abs): 0 10*3/uL (ref 0.0–0.1)
Immature Granulocytes: 0 %
LYMPHS ABS: 3.1 10*3/uL (ref 0.7–3.1)
Lymphs: 33 %
MCH: 29.5 pg (ref 26.6–33.0)
MCHC: 33.3 g/dL (ref 31.5–35.7)
MCV: 88 fL (ref 79–97)
Monocytes Absolute: 0.8 10*3/uL (ref 0.1–0.9)
Monocytes: 9 %
NEUTROS ABS: 5.3 10*3/uL (ref 1.4–7.0)
Neutrophils: 57 %
PLATELETS: 382 10*3/uL (ref 150–450)
RBC: 4.41 x10E6/uL (ref 3.77–5.28)
RDW: 13 % (ref 12.3–15.4)
WBC: 9.4 10*3/uL (ref 3.4–10.8)

## 2018-01-27 LAB — HIV ANTIBODY (ROUTINE TESTING W REFLEX): HIV SCREEN 4TH GENERATION: NONREACTIVE

## 2018-01-27 LAB — HEMOGLOBIN A1C
Est. average glucose Bld gHb Est-mCnc: 114 mg/dL
HEMOGLOBIN A1C: 5.6 % (ref 4.8–5.6)

## 2018-01-27 LAB — C-REACTIVE PROTEIN: CRP: 2 mg/L (ref 0–10)

## 2018-01-27 LAB — TSH: TSH: 2.54 u[IU]/mL (ref 0.450–4.500)

## 2018-01-27 LAB — SEDIMENTATION RATE: SED RATE: 3 mm/h (ref 0–40)

## 2018-01-27 NOTE — Telephone Encounter (Signed)
Called patient about her lab results. She did not pick up. Left a voicemail letting her know that all of her test results were normal. Encouraged her to follow up with her screening exams.

## 2018-01-28 ENCOUNTER — Other Ambulatory Visit: Payer: Self-pay | Admitting: Internal Medicine

## 2018-01-28 DIAGNOSIS — E876 Hypokalemia: Secondary | ICD-10-CM

## 2018-01-28 MED ORDER — POTASSIUM CHLORIDE ER 10 MEQ PO CPCR: 10.0000 meq | ORAL_CAPSULE | Freq: Every day | ORAL | 0 refills | Status: DC

## 2018-01-28 NOTE — Telephone Encounter (Signed)
Called pt to schedule an appt w/ her PCP; last seen 12/31/16; no answer, left message to call the office. She saw Dr Truman Hayward on 7/9.

## 2018-01-28 NOTE — Telephone Encounter (Signed)
I am not sure she needs this anymore, last K was low in 2014.  Will refill X 1 month and hopefully she will come in for a PCP appointment to address further.

## 2018-01-28 NOTE — Telephone Encounter (Signed)
Needs refill on potassium meds @ [facility]- asked pharmacy yes no order; pt contact # [phone]

## 2018-01-29 NOTE — Progress Notes (Signed)
Internal Medicine Clinic Attending  I saw and evaluated the patient.  I personally confirmed the key portions of the history and exam documented by Dr. Lee and I reviewed pertinent patient test results.  The assessment, diagnosis, and plan were formulated together and I agree with the documentation in the resident's note.  Alexander Raines, M.D., Ph.D.  

## 2018-02-01 ENCOUNTER — Encounter: Payer: Self-pay | Admitting: Internal Medicine

## 2018-02-01 NOTE — Telephone Encounter (Signed)
Have letter. Let me know if she doesn't make and keep an appt with Dr Daryll Drown for dismissal.

## 2018-02-01 NOTE — Telephone Encounter (Signed)
Lab visit is fine.  I placed an order for a future bmet.   She does need to come in yearly for refills and to be seen.  If she prefers a walk in type clinic, she may be better suited at another clinic or as a resident patient (not attending patient).   Please have her come in to do a BMET.   Doris/Dr. Lynnae January, can you review for appropriateness in Attending Clinic given her concerns, I have not seen her in 1 year, and only seen her once.   Thank you!  Gilles Chiquito, MD

## 2018-02-01 NOTE — Telephone Encounter (Signed)
Please schedule appt within 1 month for potassium concerns. This is per dr mullen's fill note 7/11

## 2018-02-01 NOTE — Telephone Encounter (Addendum)
Spoke with patient and attempted to make an appt-pt states she does not need to be seen at this time and declined the appt. Explained to patient that pcp had requested an appt in the next 30 days to address potassium concerns. Pt still does not understand why she needs to return when she was just seen and her labs were all normal-will follow up with pcp to see if lab only visit will do for next month.  Pt also states she does not have the money for MD appts.Regenia Skeeter, Johnika Escareno Cassady7/15/20192:04 PM

## 2018-02-01 NOTE — Addendum Note (Signed)
Addended by: Gilles Chiquito B on: 02/01/2018 02:26 PM   Modules accepted: Orders

## 2018-02-03 ENCOUNTER — Encounter: Payer: Self-pay | Admitting: Internal Medicine

## 2018-03-03 ENCOUNTER — Encounter: Payer: Self-pay | Admitting: Internal Medicine

## 2018-03-17 ENCOUNTER — Encounter: Payer: Self-pay | Admitting: Internal Medicine

## 2018-03-17 ENCOUNTER — Encounter: Payer: Medicare HMO | Admitting: Internal Medicine

## 2018-04-21 ENCOUNTER — Encounter: Payer: Medicare HMO | Admitting: Internal Medicine

## 2018-04-21 ENCOUNTER — Encounter: Payer: Self-pay | Admitting: Internal Medicine

## 2018-05-17 ENCOUNTER — Encounter: Payer: Self-pay | Admitting: Internal Medicine

## 2018-06-01 ENCOUNTER — Emergency Department (HOSPITAL_COMMUNITY)
Admission: EM | Admit: 2018-06-01 | Discharge: 2018-06-01 | Disposition: A | Discharge status: Home / Self Care | Payer: Medicare HMO | Attending: Emergency Medicine | Admitting: Emergency Medicine

## 2018-06-01 ENCOUNTER — Encounter (HOSPITAL_COMMUNITY): Payer: Self-pay

## 2018-06-01 DIAGNOSIS — Z79899 Other long term (current) drug therapy: Secondary | ICD-10-CM | POA: Insufficient documentation

## 2018-06-01 DIAGNOSIS — J449 Chronic obstructive pulmonary disease, unspecified: Secondary | ICD-10-CM | POA: Insufficient documentation

## 2018-06-01 DIAGNOSIS — I471 Supraventricular tachycardia: Secondary | ICD-10-CM | POA: Diagnosis not present

## 2018-06-01 DIAGNOSIS — I4891 Unspecified atrial fibrillation: Secondary | ICD-10-CM | POA: Diagnosis not present

## 2018-06-01 DIAGNOSIS — R002 Palpitations: Secondary | ICD-10-CM | POA: Diagnosis present

## 2018-06-01 LAB — I-STAT CHEM 8, ED
BUN: 10 mg/dL (ref 8–23)
Calcium, Ion: 1.1 mmol/L — ABNORMAL LOW (ref 1.15–1.40)
Chloride: 108 mmol/L (ref 98–111)
Creatinine, Ser: 0.5 mg/dL (ref 0.44–1.00)
Glucose, Bld: 97 mg/dL (ref 70–99)
HEMATOCRIT: 35 % — AB (ref 36.0–46.0)
HEMOGLOBIN: 11.9 g/dL — AB (ref 12.0–15.0)
POTASSIUM: 3.6 mmol/L (ref 3.5–5.1)
SODIUM: 141 mmol/L (ref 135–145)
TCO2: 24 mmol/L (ref 22–32)

## 2018-06-01 LAB — I-STAT TROPONIN, ED: TROPONIN I, POC: 0 ng/mL (ref 0.00–0.08)

## 2018-06-01 NOTE — Discharge Instructions (Addendum)
Please contact your cardiologist in regards to your visit to the emergency department today.  Please make an appointment with your primary care doctor to repeat your labs as your hemoglobin was slightly low today.  Please return to the emergency department for any chest pain, shortness of breath, continued palpitations or any new or worsening symptoms.

## 2018-06-01 NOTE — ED Triage Notes (Signed)
Pt coming from home by ems. Pt was woken up out of her sleep due to a fast heart rate. When ems arrived HR was 204. enroute pt was given 6mg  of adenosine and pt took 10mg  propanolol. Pt states this has happened before and had a cardiac ablation. Pt is currently in NSR with no pain at this time. Pt is axox4

## 2018-06-01 NOTE — ED Provider Notes (Addendum)
[facility] EMERGENCY DEPARTMENT Provider Note   CSN: [id number] Arrival date & time: 06/01/18  7741     History   Chief Complaint Chief Complaint  Patient presents with  . svt    HPI Sheri [name] is a 67 y.o. female.  HPI  67 year old female with a history of A. fib with RVR, COPD, GERD, atrial tachycardia, who presents the emergency department today for evaluation of SVT.  Patient states that she awoke this morning and felt palpitations.  She took 10 mg of propranolol to help her symptoms and then contacted EMS.  On arrival EMS noted her heart rate was 204.  In route she was given 6 mg of adenosine and converted to normal sinus rhythm.  On arrival to the ED patient states symptoms have resolved.  Patient states she had no chest pain or shortness of breath throughout the entire episode.  She continues to be chest pain-free currently.  States that she has a frequent history of SVT and has had an ablation in the past.  Denies any recent medication changes.  No fevers.  Denies leg pain/swelling, hemoptysis, recent surgery/trauma, recent long travel, hormone use, personal hx of DVT/PE.   Reviewed EMS rhythm strips at bedside and they appear consistent with SVT.  Past Medical History:  Diagnosis Date  . Allergic rhinitis   . Anxiety   . Atrial fibrillation with RVR ([place])   . Cancer ([place])    left lower eyelid, basal cell cancer  . Chronic sinusitis   . COPD (chronic obstructive pulmonary disease) ([place])   . DEGENERATIVE JOINT DISEASE, HANDS    several images revealing OA  . Depression    treated at [facility]  . GERD (gastroesophageal reflux disease)   . Goiter    hx of goiter  . Hx of atrial tachycardia since age 74 03/15/2013  . Palpitations    Associated with difficulty breathing  . Panic attacks   . Poisoning by selective serotonin reuptake inhibitors(969.03) Jan 2007   severe reaction with MAOI (nardil) and dextromethorphan  . Tachycardia      Patient Active Problem List   Diagnosis Date Noted  . Breast cancer screening by mammogram 01/26/2018  . Post-menopausal osteoporosis 01/26/2018  . Unintentional weight loss 01/26/2018  . Overweight (BMI 25.0-29.9) 09/04/2017  . Postmenopausal 12/31/2016  . Insomnia 12/31/2016  . Memory loss 12/31/2016  . Poor dentition 12/31/2016  . Plasma donor 11/05/2016  . Mnire's disease 03/07/2016  . Chiari I malformation ([place]) 03/07/2016  . AF (atrial fibrillation) ([place]) 10/12/2015  . Healthcare maintenance 04/14/2013  . Financial difficulties 09/09/2012  . Hearing loss 12/02/2006  . ANXIETY DISORDER, GENERALIZED 08/18/2006  . Depression 08/18/2006    Past Surgical History:  Procedure Laterality Date  . APPENDECTOMY    . COLONOSCOPY    . ELECTROPHYSIOLOGIC STUDY N/A 10/12/2015   Procedure: Atrial Fibrillation Ablation;  Surgeon: [name], MD;  Location: [facility];  Service: Cardiovascular;  Laterality: N/A;  . LID LESION EXCISION Left 08/06/2017   Procedure: EXCISION BIOPSY OF EYELID, RECONSTRUCTION OF EYELID;  Surgeon: [name], MD;  Location: [facility];  Service: Ophthalmology;  Laterality: Left;  . OVARIAN CYST REMOVAL  march 1969   left ovarian cystectomy  . right mastoidectomy     ET tube placement in 1980 and 1990  . right sinus surgery  october 2000  . THYROIDECTOMY, PARTIAL     nodule removal - April 1996  OB History    Gravida  4   Para  3   Term  3   Preterm      AB  1   Living  3     SAB  1   TAB      Ectopic      Multiple      Live Births               Home Medications    Prior to Admission medications   Medication Sig Start Date End Date Taking? Authorizing Provider  albuterol (PROVENTIL HFA;VENTOLIN HFA) 108 (90 Base) MCG/ACT inhaler Inhale 1-2 puffs into the lungs every 6 (six) hours as needed for wheezing or shortness of breath. 12/16/17  Yes Robyn Haber, MD  Multiple Vitamin (MULTIVITAMIN WITH  MINERALS) TABS tablet Take 1 tablet by mouth daily.   Yes [provider]  propranolol (INDERAL) 10 MG tablet Take 1 tablet (10 mg total) by mouth 2 (two) times daily. PT MUST KEEP HER APPT 01/20/18 FOR ADDITIONAL REFILLS. Patient taking differently: Take 10 mg by mouth 2 (two) times daily as needed (rapid heart beat).  01/19/18  Yes Camnitz, [name], MD  QUEtiapine (SEROQUEL) 200 MG tablet Take 100 mg by mouth at bedtime.   Yes [provider]  traZODone (DESYREL) 100 MG tablet Take 100 mg by mouth at bedtime.   Yes [provider]  venlafaxine (EFFEXOR) 75 MG tablet Take 75 mg by mouth 3 (three) times daily with meals.  07/17/17  Yes [provider]  potassium chloride (MICRO-K) 10 MEQ CR capsule Take 1 capsule (10 mEq total) by mouth daily. Patient not taking: Reported on 06/01/2018 01/28/18   Sid Falcon, MD    Family History Family History  Problem Relation Age of Onset  . Hypertension Mother   . Hypertension Father   . Psoriasis Father   . Heart attack Father   . Atrial fibrillation Sister        had a successful a fib ablation  . Atrial fibrillation Sister        had a successful a fib ablation    Social History Social History   Tobacco Use  . Smoking status: Never Smoker  . Smokeless tobacco: Never Used  Substance Use Topics  . Alcohol use: No    Alcohol/week: 0.0 standard drinks  . Drug use: No     Allergies   Guaifenesin   Review of Systems Review of Systems  Constitutional: Positive for diaphoresis. Negative for chills and fever.  HENT: Negative for congestion and rhinorrhea.   Eyes: Negative for visual disturbance.  Respiratory: Negative for cough and shortness of breath.   Cardiovascular: Positive for palpitations. Negative for chest pain and leg swelling.  Gastrointestinal: Negative for abdominal pain, constipation, diarrhea, nausea and vomiting.  Genitourinary: Negative for dysuria.  Musculoskeletal: Negative for  back pain.  Skin: Negative for rash.  Neurological: Negative for light-headedness and headaches.   Physical Exam Updated Vital Signs BP 102/74   Pulse 88   Temp 98 F (36.7 C)   Resp (!) 21   Ht 5\' 5"  (1.651 m)   Wt 59 kg   SpO2 97%   BMI 21.63 kg/m   Physical Exam  Constitutional: No distress.  Thin appearing female  HENT:  Head: Normocephalic and atraumatic.  Eyes: Conjunctivae are normal.  Neck: Neck supple.  Cardiovascular: Normal rate, regular rhythm and normal heart sounds.  No murmur heard. Pulmonary/Chest: Effort normal and breath  sounds normal. No stridor. No respiratory distress. She has no wheezes.  Abdominal: Soft. Bowel sounds are normal. There is no tenderness.  Musculoskeletal:  No calf TTP, erythema, swelling.  Neurological: She is alert.  Skin: Skin is warm and dry.  Psychiatric: She has a normal mood and affect.  Nursing note and vitals reviewed.  ED Treatments / Results  Labs (all labs ordered are listed, but only abnormal results are displayed) Labs Reviewed  I-STAT CHEM 8, ED - Abnormal; Notable for the following components:      Result Value   Calcium, Ion 1.10 (*)    Hemoglobin 11.9 (*)    HCT 35.0 (*)    All other components within normal limits  I-STAT TROPONIN, ED    EKG EKG Interpretation  Date/Time:  Tuesday June 01 2018 05:25:24 EST Ventricular Rate:  91 PR Interval:    QRS Duration: 86 QT Interval:  367 QTC Calculation: 452 R Axis:   20 Text Interpretation:  Sinus rhythm Abnormal R-wave progression, early transition Confirmed by Thayer Jew [phone]) on 06/01/2018 7:09:46 AM   Radiology No results found.  Procedures Procedures (including critical care time)  Medications Ordered in ED Medications - No data to display   Initial Impression / Assessment and Plan / ED Course  I have reviewed the triage vital signs and the nursing notes.  Pertinent labs & imaging results that were available during my care of the  patient were reviewed by me and considered in my medical decision making (see chart for details).    Discussed pt presentation and exam findings with Dr. Dina Rich, who agrees with the plan.   Final Clinical Impressions(s) / ED Diagnoses   Final diagnoses:  Paroxysmal SVT (supraventricular tachycardia) [facility])   Patient presents the emergency department today for evaluation of palpitations.  EMS noted patient to be in SVT and route.  They gave adenosine 6 mg and patient converted to normal sinus rhythm.  Patient denies any chest pain or shortness of breath during or after the episode.  Her symptoms have resolved on arrival to the ED.  Cardiac and pulmonary exam benign in the ED.  Patient very well-appearing.  Vital signs are stable. Pt monitored in the department for several hours with no recurrence of sxs. Repeat EKG with normal sinus rhythm and no ischemic changes.  Will electrolytes within normal limits.  Mild anemia.  Troponin negative.  Doubt ACS, PE, or other emergent cardiac or pulmonary etiology that would require further intervention or further work-up in the ED.  Feel that patient is safe for discharge with close cardiology and PCP follow-up.  She voices understanding the plan and reasons to return to the ED.  All questions answered  ED Discharge Orders    None       Rodney Booze, PA-C 06/01/18 [id number]    Rodney Booze, PA-C 06/01/18 1761    Merryl Hacker, MD 06/02/18 [phone]

## 2018-06-01 NOTE — ED Notes (Signed)
Patient verbalizes understanding of discharge instructions. Opportunity for questioning and answers were provided. Armband removed by staff, pt discharged from ED ambulatory.   

## 2018-06-07 ENCOUNTER — Encounter: Payer: Self-pay | Admitting: Cardiology

## 2018-06-07 ENCOUNTER — Ambulatory Visit: Payer: Medicare HMO | Admitting: Cardiology

## 2018-06-07 VITALS — BP 118/60 | HR 74 | Ht 65.0 in | Wt 135.0 lb

## 2018-06-07 DIAGNOSIS — I480 Paroxysmal atrial fibrillation: Secondary | ICD-10-CM | POA: Diagnosis not present

## 2018-06-07 DIAGNOSIS — I471 Supraventricular tachycardia: Secondary | ICD-10-CM | POA: Diagnosis not present

## 2018-06-07 MED ORDER — DILTIAZEM HCL 30 MG PO TABS: 30.0000 mg | ORAL_TABLET | ORAL | 1 refills | Status: DC | PRN

## 2018-06-07 NOTE — Addendum Note (Signed)
Addended by: Stanton Kidney on: 06/07/2018 04:53 PM   Modules accepted: Orders

## 2018-06-07 NOTE — Patient Instructions (Addendum)
Medication Instructions:  Your physician has recommended you make the following change in your medication:  1. You may take Diltiazem 30 mg every 6 as needed for SVT  * If you need a refill on your cardiac medications before your next appointment, please call your pharmacy.   Labwork: None ordered  Testing/Procedures: None ordered  Follow-Up: Your physician recommends that you schedule a follow-up appointment in: 3 months with Dr. [name].  *Please note that any paperwork needing to be filled out by the provider will need to be addressed at the front desk prior to seeing the provider. Please note that any FMLA, disability or other documents regarding health condition is subject to a $25.00 charge that must be received prior to completion of paperwork in the form of a money order or check.  Thank you for choosing [facility]!!   [name], RN [phone]  Any Other Special Instructions Will Be Listed Below (If Applicable).

## 2018-06-07 NOTE — Progress Notes (Signed)
Electrophysiology Office Note   Date:  06/07/2018   ID:  Sheri [name], DOB [birth year], MRN [id number]  PCP:  Sid Falcon, MD  Cardiologist:  Debara Pickett Primary Electrophysiologist:  [name], MD    No chief complaint on file.    History of Present Illness: Sheri [name] is a 67 y.o. female who presents today for electrophysiology evaluation.   She has a history of atrial fibrillation.    She admitted in 02/2013 for A fib with RVR she was placed on procainamide drip. She then converted to SR. She had echo done which was generally normal. She does report 2 of her sisters with the same disorder - they have both undergone a-fib ablations which have successfully terminated their arrhythmias. She is treated for anxiety and panic attacks with seroquel, trazadone, effexor and xanax.  He had an atrial fibrillation ablation 10/12/2015.  Today, denies symptoms of chest pain, shortness of breath, orthopnea, PND, lower extremity edema, claudication, dizziness, presyncope, syncope, bleeding, or neurologic sequela. The patient is tolerating medications without difficulties.  She presented to the emergency room on 07/01/2018 with an episode of neuro complex tachycardia.  She tried vagal maneuvers, which did not work.  She had a EMS who gave her 6 mg of adenosine which resulted in sinus rhythm.  She has had no further episodes.   Past Medical History:  Diagnosis Date  . Allergic rhinitis   . Anxiety   . Atrial fibrillation with RVR ([place])   . Cancer ([place])    left lower eyelid, basal cell cancer  . Chronic sinusitis   . COPD (chronic obstructive pulmonary disease) ([place])   . DEGENERATIVE JOINT DISEASE, HANDS    several images revealing OA  . Depression    treated at [facility]  . GERD (gastroesophageal reflux disease)   . Goiter    hx of goiter  . Hx of atrial tachycardia since age 36 03/15/2013  . Palpitations    Associated with difficulty breathing  . Panic attacks   .  Poisoning by selective serotonin reuptake inhibitors(969.03) Jan 2007   severe reaction with MAOI (nardil) and dextromethorphan  . Tachycardia    Past Surgical History:  Procedure Laterality Date  . APPENDECTOMY    . COLONOSCOPY    . ELECTROPHYSIOLOGIC STUDY N/A 10/12/2015   Procedure: Atrial Fibrillation Ablation;  Surgeon: [name], MD;  Location: [facility];  Service: Cardiovascular;  Laterality: N/A;  . LID LESION EXCISION Left 08/06/2017   Procedure: EXCISION BIOPSY OF EYELID, RECONSTRUCTION OF EYELID;  Surgeon: [name], MD;  Location: [facility];  Service: Ophthalmology;  Laterality: Left;  . OVARIAN CYST REMOVAL  march 1969   left ovarian cystectomy  . right mastoidectomy     ET tube placement in 1980 and 1990  . right sinus surgery  october 2000  . THYROIDECTOMY, PARTIAL     nodule removal - April 1996     Current Outpatient Medications  Medication Sig Dispense Refill  . albuterol (PROVENTIL HFA;VENTOLIN HFA) 108 (90 Base) MCG/ACT inhaler Inhale 1-2 puffs into the lungs every 6 (six) hours as needed for wheezing or shortness of breath. 1 Inhaler 0  . Multiple Vitamin (MULTIVITAMIN WITH MINERALS) TABS tablet Take 1 tablet by mouth daily.    . propranolol (INDERAL) 10 MG tablet Take 1 tablet (10 mg total) by mouth 2 (two) times daily. PT MUST KEEP HER APPT 01/20/18 FOR ADDITIONAL REFILLS. 90 tablet 1  . QUEtiapine (SEROQUEL)  200 MG tablet Take 100 mg by mouth at bedtime.    . traZODone (DESYREL) 100 MG tablet Take 100 mg by mouth at bedtime.    Marland Kitchen venlafaxine (EFFEXOR) 75 MG tablet Take 75 mg by mouth 3 (three) times daily with meals.      No current facility-administered medications for this visit.     Allergies:   Guaifenesin   Social History:  The patient  reports that she has never smoked. She has never used smokeless tobacco. She reports that she does not drink alcohol or use drugs.   Family History:  The patient's family history includes Atrial  fibrillation in her sister and sister; Heart attack in her father; Hypertension in her father and mother; Psoriasis in her father.    ROS:  Please see the history of present illness.   Otherwise, review of systems is positive for weight gain, palpitations.   All other systems are reviewed and negative.   PHYSICAL EXAM: VS:  BP 118/60   Pulse 74   Ht 5\' 5"  (1.651 m)   Wt 135 lb (61.2 kg)   SpO2 97%   BMI 22.47 kg/m  , BMI Body mass index is 22.47 kg/m. GEN: Well nourished, well developed, in no acute distress  HEENT: normal  Neck: no JVD, carotid bruits, or masses Cardiac: RRR; no murmurs, rubs, or gallops,no edema  Respiratory:  clear to auscultation bilaterally, normal work of breathing GI: soft, nontender, nondistended, + BS MS: no deformity or atrophy  Skin: warm and dry Neuro:  Strength and sensation are intact Psych: euthymic mood, full affect  EKG:  EKG is not ordered today. Personal review of the ekg ordered shows sinus rhythm EKG from 07/01/2018 EMS run sheet shows SVT rate 205  Recent Labs: 01/26/2018: ALT 12; Platelets 382; TSH 2.540 06/01/2018: BUN 10; Creatinine, Ser 0.50; Hemoglobin 11.9; Potassium 3.6; Sodium 141    Lipid Panel     Component Value Date/Time   CHOL 111 03/16/2013 0355   TRIG 70 03/16/2013 0355   HDL 39 (L) 03/16/2013 0355   CHOLHDL 2.8 03/16/2013 0355   VLDL 14 03/16/2013 0355   LDLCALC 58 03/16/2013 0355     Wt Readings from Last 3 Encounters:  06/07/18 135 lb (61.2 kg)  06/01/18 130 lb (59 kg)  01/26/18 134 lb 6.4 oz (61 kg)      Other studies Reviewed: Additional studies/ records that were reviewed today include: TTE 2014  Review of the above records today demonstrates:  - Left ventricle: The cavity size was normal. Wall thickness was increased in a pattern of mild LVH. Systolic function was normal. The estimated ejection fraction was in the range of 55% to 60%. Wall motion was normal; there were no regional wall  motion abnormalities. Left ventricular diastolic function parameters were normal. - Left atrium: The atrium was normal in size. - Tricuspid valve: Poorly visualized. Trivial regurgitation. - Pulmonary arteries: PA peak pressure: 74mm Hg (S). - Inferior vena cava: The vessel was normal in size; the respirophasic diameter changes were in the normal range  50%); findings are consistent with normal central venous pressure.   ASSESSMENT AND PLAN:  1.  Paroxysmal atrial fibrillation: Status post ablation 10/12/2015.  Has had no known further episodes of atrial fibrillation.  She is not anticoagulated.  No changes.    This patients CHA2DS2-VASc Score and unadjusted Ischemic Stroke Rate (% per year) is equal to 2.2 % stroke rate/year from a score of 2  Above score calculated  as 1 point each if present [CHF, HTN, DM, Vascular=MI/PAD/Aortic Plaque, Age if 65-74, or Female] Above score calculated as 2 points each if present [Age > 75, or Stroke/TIA/TE]  2.  SVT: Responded to 6 mg of adenosine.  Is likely due to AVNRT, though ORT cannot be completely ruled out.  We will plan to start her on as needed diltiazem.  I will see her back in 3 months for further evaluation.  Current medicines are reviewed at length with the patient today.   The patient does not have concerns regarding her medicines.  The following changes were made today:  diltiazem  Labs/ tests ordered today include:  No orders of the defined types were placed in this encounter.    Disposition:   FU with WilL Camnitz 3 months  Signed, [name], MD  06/07/2018 4:38 PM     [address] [place] [phone] (office) [phone] (fax)

## 2018-06-09 ENCOUNTER — Encounter: Payer: Self-pay | Admitting: Internal Medicine

## 2018-06-09 ENCOUNTER — Other Ambulatory Visit: Payer: Self-pay

## 2018-06-09 ENCOUNTER — Ambulatory Visit (INDEPENDENT_AMBULATORY_CARE_PROVIDER_SITE_OTHER): Payer: Medicare HMO | Admitting: Internal Medicine

## 2018-06-09 DIAGNOSIS — R634 Abnormal weight loss: Secondary | ICD-10-CM | POA: Diagnosis not present

## 2018-06-09 DIAGNOSIS — R062 Wheezing: Secondary | ICD-10-CM | POA: Diagnosis not present

## 2018-06-09 DIAGNOSIS — I480 Paroxysmal atrial fibrillation: Secondary | ICD-10-CM

## 2018-06-09 DIAGNOSIS — K089 Disorder of teeth and supporting structures, unspecified: Secondary | ICD-10-CM

## 2018-06-09 DIAGNOSIS — F419 Anxiety disorder, unspecified: Secondary | ICD-10-CM

## 2018-06-09 DIAGNOSIS — K0889 Other specified disorders of teeth and supporting structures: Secondary | ICD-10-CM

## 2018-06-09 DIAGNOSIS — F3341 Major depressive disorder, recurrent, in partial remission: Secondary | ICD-10-CM

## 2018-06-09 DIAGNOSIS — I471 Supraventricular tachycardia: Secondary | ICD-10-CM | POA: Diagnosis not present

## 2018-06-09 DIAGNOSIS — Z79899 Other long term (current) drug therapy: Secondary | ICD-10-CM

## 2018-06-09 DIAGNOSIS — I4891 Unspecified atrial fibrillation: Secondary | ICD-10-CM | POA: Diagnosis not present

## 2018-06-09 DIAGNOSIS — Z8669 Personal history of other diseases of the nervous system and sense organs: Secondary | ICD-10-CM

## 2018-06-09 DIAGNOSIS — Z0000 Encounter for general adult medical examination without abnormal findings: Secondary | ICD-10-CM

## 2018-06-09 DIAGNOSIS — Z6822 Body mass index (BMI) 22.0-22.9, adult: Secondary | ICD-10-CM

## 2018-06-09 DIAGNOSIS — F339 Major depressive disorder, recurrent, unspecified: Secondary | ICD-10-CM

## 2018-06-09 MED ORDER — ALBUTEROL SULFATE HFA 108 (90 BASE) MCG/ACT IN AERS: 1.0000 | INHALATION_SPRAY | Freq: Four times a day (QID) | RESPIRATORY_TRACT | 0 refills | Status: DC | PRN

## 2018-06-09 NOTE — Progress Notes (Signed)
Subjective:    Patient ID: Sheri [name], female    DOB: [birth year], 67 y.o.   MRN: [id number]  CC: 6 month follow up for weight loss.   HPI  Sheri [name] is a 67yo woman with PMH of Atrial flutter and recently AVNRT, anxiety/depression, meneire's disease and was last seen in our clinic for weight loss.   As concerns the fast heart rate, she recently was seen in the ED for this issue and her symptoms resolved at the ED.  She was given follow up with me and with Dr. [name] in Cardiology.  I reviewed cardiology notes and she is to be placed on PRN diltiazem if the abnormal rhythm occurs again and continue her propranolol.  She has had ablation for Afib in the past and is not on anticoagulation.    The other issue she wished to discuss today is weight loss.  She had a significant weight loss earlier this year.  She notes that she had changed her diet due to poor dentition and hasn't been eating well.  Since July, her weight has stabilized and she has actually gained 5 pounds.  At the time of the initial weight loss she had blood work done which was normal and was advised to get all of her cancer screening.  She is due to have a mammogram in December and will make an appointment for colonoscopy.  She has had no abdominal symptoms, bloating, abdominal pain, night sweats, chills or other symptoms that would lead to further work up at this time. I think she may have had worsening dentition leading to poor/decreased eating habits. Will follow up cancer screening.  SHe is a never smoker.  She is post-menopausal at this time and has not had a return of periods.    She is most concerned today about feeling like her skin is "hanging off her bones."  She states that this is a recent occurrence and she feels like it makes her look old.  She reports a poor diet and no exercise.  We discussed improving her diet and exercise.  We also discussed skin firming creams with retinoids and supplements such as vitamin C and  evening primrose oil which may help.    Review of Systems  Constitutional: Positive for appetite change (due to poor dentition). Negative for activity change and unexpected weight change (none since last being seen in our clinic).  Eyes: Negative for visual disturbance.  Respiratory: Negative for cough and shortness of breath.   Cardiovascular: Negative for chest pain and leg swelling.  Gastrointestinal: Negative for abdominal distention, abdominal pain, anal bleeding, blood in stool, constipation, diarrhea and nausea.  Genitourinary: Negative for difficulty urinating and dysuria.  Musculoskeletal: Negative for arthralgias and gait problem.  Skin: Negative for rash and wound.  Neurological: Negative for dizziness.  Psychiatric/Behavioral: Positive for dysphoric mood. Negative for decreased concentration.       Objective:   Physical Exam  Constitutional: She is oriented to person, place, and time. She appears well-developed and well-nourished. No distress.  HENT:  Head: Normocephalic and atraumatic.  Cardiovascular: Normal rate and regular rhythm.  No murmur heard. Pulmonary/Chest: Effort normal and breath sounds normal. No respiratory distress.  Abdominal: Soft. She exhibits no distension.  Musculoskeletal: She exhibits no edema or tenderness.  Neurological: She is alert and oriented to person, place, and time.  Psychiatric:  She had a very flat affect.  Otherwise, behavior was normal.   Vitals reviewed.   No labs today, recently  done in the ED.  She had a very mild (0.1 below lower limit of normal) anemia.        Assessment & Plan:  RTC in 1 year, sooner if needed or for any concerns.   Loosening of skin - I have a feeling this is due to her previous weight loss.  We discussed ways to remedy the situation.   Occasional wheezing - She uses PRN albuterol and asks for a refill which I provided.

## 2018-06-09 NOTE — Assessment & Plan Note (Signed)
Following with cardiology.  She is on propranolol and PRN diltiazem.  No further symptoms of palpitations.   PLan Diltiazem PRN Follow up as planned with Dr. [name]

## 2018-06-09 NOTE — Assessment & Plan Note (Signed)
This has stabilized.  She does not seem too worried about it, except for the loosening of her skin.  She has no pain, myalgia or weakness.  Will continue to monitor and follow up on cancer screening.

## 2018-06-09 NOTE — Patient Instructions (Addendum)
Sheri [name] - -  It was a pleasure to see you today.   For your skin changes - try weight training like we discussed, this can be done at home.  You can also try a skin cream with a retinoid in it.  You need to avoid exposure to sunlight if you use a cream like this.  You might also notice skin irritation when you first start using it.  You can also try supplementation with vitamin C or evening primrose oil.    Come back to see me in 1 year.  Sooner if you need to or anything changes.    Thank you!

## 2018-06-09 NOTE — Assessment & Plan Note (Signed)
We discussed ways to access a dentist.  I advised her to call her insurance company and ask for a list of providers who accept her insurance.  She will do this.  I think this may help with her appetite and weight.

## 2018-06-09 NOTE — Assessment & Plan Note (Signed)
She is treated for depression and anxiety at [facility].  She had a PHQ9 score of 10.  She likely needs modification of her treatment plan which I will defer to that group.  I asked her about her support system and she seems to have a limited group of people with whom she interacts.  She is closest to her son Lyla Son, but he does not live close.  Her closest relative is her daughter who lives in [place], but they are not close.    Follow up as planned at [facility] Continue Quetiapine, effexor, trazodone.

## 2018-06-09 NOTE — Assessment & Plan Note (Signed)
She is scheduled to have her Mammogram in December, DEXA in January and I encouraged her to get her colonoscopy on the schedule.  She reports never having an abnormal pap smear, we do not have any records. Consider repeat PAP If any new symptoms.  She is 67 years old.  Will discuss further with her at next visit.

## 2018-06-24 ENCOUNTER — Other Ambulatory Visit: Payer: Self-pay | Admitting: *Deleted

## 2018-06-24 MED ORDER — PROPRANOLOL HCL 10 MG PO TABS: 10.0000 mg | ORAL_TABLET | Freq: Two times a day (BID) | ORAL | 1 refills | Status: DC

## 2018-06-28 ENCOUNTER — Ambulatory Visit: Admission: RE | Admit: 2018-06-28 | Payer: Medicare HMO | Source: Ambulatory Visit

## 2018-06-28 ENCOUNTER — Other Ambulatory Visit: Payer: Self-pay | Admitting: Student in an Organized Health Care Education/Training Program

## 2018-06-28 DIAGNOSIS — N6452 Nipple discharge: Secondary | ICD-10-CM

## 2018-07-20 ENCOUNTER — Other Ambulatory Visit: Payer: Self-pay | Admitting: Internal Medicine

## 2018-07-20 DIAGNOSIS — Z13820 Encounter for screening for osteoporosis: Secondary | ICD-10-CM

## 2018-07-22 ENCOUNTER — Other Ambulatory Visit: Payer: Medicare HMO

## 2018-07-26 ENCOUNTER — Other Ambulatory Visit: Payer: Medicare HMO

## 2018-07-29 ENCOUNTER — Other Ambulatory Visit: Payer: Medicare HMO

## 2018-08-02 ENCOUNTER — Ambulatory Visit: Payer: Medicare HMO | Admitting: Cardiology

## 2018-08-04 ENCOUNTER — Other Ambulatory Visit: Payer: Medicare HMO

## 2018-08-06 ENCOUNTER — Other Ambulatory Visit: Payer: Self-pay

## 2018-08-06 ENCOUNTER — Encounter: Payer: Self-pay | Admitting: Internal Medicine

## 2018-08-06 ENCOUNTER — Ambulatory Visit (INDEPENDENT_AMBULATORY_CARE_PROVIDER_SITE_OTHER): Payer: Medicare HMO | Admitting: Internal Medicine

## 2018-08-06 VITALS — BP 107/63 | HR 83 | Temp 97.9°F | Ht 65.5 in | Wt 139.2 lb

## 2018-08-06 DIAGNOSIS — R1031 Right lower quadrant pain: Secondary | ICD-10-CM | POA: Diagnosis not present

## 2018-08-06 DIAGNOSIS — H8109 Meniere's disease, unspecified ear: Secondary | ICD-10-CM | POA: Diagnosis not present

## 2018-08-06 MED ORDER — MECLIZINE HCL 25 MG PO TABS: 25.0000 mg | ORAL_TABLET | Freq: Three times a day (TID) | ORAL | 0 refills | Status: DC | PRN

## 2018-08-06 NOTE — Progress Notes (Signed)
Internal Medicine Clinic Attending  Case discussed with Dr. Masoudi  at the time of the visit.  We reviewed the resident's history and exam and pertinent patient test results.  I agree with the assessment, diagnosis, and plan of care documented in the resident's note.  

## 2018-08-06 NOTE — Assessment & Plan Note (Addendum)
[name] reports 1 week history of constant mild- moderate discomfort at right lower quadrant that radiates to her suprapubic area. Denies any nausea vomiting, fever, diarrhea or constipation.  Also denies any urinary symptoms.  No vaginal bleeding.   On physical exam has minor tenderness at suprapubic area. Her clinical presentation is not suggestive of renal colic. No urinary symptoms to be suggestive of UTI, She has history of appendectomy before. Ovarian cyst less likely considering her age. Weight is stable. No red flag and overally is reassuring at this time.  Will observe her pain and may do further work up if worsened.

## 2018-08-06 NOTE — Assessment & Plan Note (Addendum)
Patient reports 2 to 3 weeks history of dizziness and intermittent vertigo, denies any earache, nausea or vomiting, worsening of hearing loss. Her dizziness is not positional.  She mentions that she had similar symptoms before and last seen by ENT at 2018 and prescribed Meclizine that was helpful. (has not been able to get a refill on meclizine)  She took over-the-counter motion sickness pill for her current symptoms, that helped with balance issue.  She mentions that she feels there is extra fluid in her head or ears. She has trace vertical and horizontal nystagmus on exam.  Gait is intact.  Finger-to-nose exam and Romberg test are normal. No neurologic deficit. Her symptoms likely secondary to Mnire disease. Will prescribe Meclizine and will follow up with her ENT.  -Meclizine 25 mg TID PRN -Ambulatory referral to ENT, Dr. Minna Merritts -Recommended to have a salt restricted diet and avoid caffeine and alcohol

## 2018-08-06 NOTE — Patient Instructions (Addendum)
Thank you for allowing [place] to provide your care today. Today we discussed dizziness and balance problems. Your physical exam is reassuring.  I prescribed meclizine for you to be taken 3 times per day as needed.  Also recommend you the salt restricted diet and avoiding coffee and alcohol since those may worsen your Mnire disease and dizziness.  As we discussed and since he has not seen your ENT doctor for a while, I send a referral for ENT visit.  We also talked about her abdominal pain.  Your history and physical exam is not concerning at this point, we will obsereve it and you can come back to clinic if no improvement in a week.  Please follow-up in clinic in November 2020 (per your PCP note) or sooner if needed.  Should you have any questions or concerns please call the internal medicine clinic at [phone].

## 2018-08-06 NOTE — Progress Notes (Signed)
   CC: Denies and balance issue HPI:  Sheri [name] is a 68 y.o. female with past medical history listed below, presented to clinic today due to balance issue and dizziness.  Please refer to problem based charting for further details and assessment and plan.  Past Medical History:  Diagnosis Date  . Allergic rhinitis   . Anxiety   . Atrial fibrillation with RVR ([place])   . Cancer ([place])    left lower eyelid, basal cell cancer  . Chronic sinusitis   . COPD (chronic obstructive pulmonary disease) ([place])   . DEGENERATIVE JOINT DISEASE, HANDS    several images revealing OA  . Depression    treated at [facility]  . GERD (gastroesophageal reflux disease)   . Goiter    hx of goiter  . Hx of atrial tachycardia since age 53 03/15/2013  . Palpitations    Associated with difficulty breathing  . Panic attacks   . Poisoning by selective serotonin reuptake inhibitors(969.03) Jan 2007   severe reaction with MAOI (nardil) and dextromethorphan  . Tachycardia    Review of Systems:  Review of Systems  HENT: Positive for hearing loss. Negative for congestion, ear discharge, ear pain and sinus pain.   Gastrointestinal: Positive for abdominal pain. Negative for blood in stool, constipation, diarrhea, nausea and vomiting.  Genitourinary: Negative for dysuria, frequency, hematuria and urgency.  Neurological: Positive for dizziness. Negative for tingling, sensory change, focal weakness, weakness and headaches.     Physical Exam:  There were no vitals filed for this visit. Physical Exam HENT:     Head: Normocephalic.     Right Ear: Tympanic membrane, ear canal and external ear normal.     Left Ear: Tympanic membrane, ear canal and external ear normal.  Cardiovascular:     Rate and Rhythm: Normal rate and regular rhythm.     Pulses: Normal pulses.     Heart sounds: Normal heart sounds. No murmur.  Pulmonary:     Effort: Pulmonary effort is normal.     Breath sounds: Normal breath  sounds.  Abdominal:     Palpations: Abdomen is soft.     Tenderness: There is abdominal tenderness. There is no guarding or rebound.  Musculoskeletal:        General: No swelling or tenderness.  Neurological:     Mental Status: She is alert and oriented to person, place, and time. Mental status is at baseline.     Motor: No weakness or tremor.     Coordination: Romberg sign negative. Coordination normal. Finger-Nose-Finger Test normal.     Gait: Gait is intact.  Psychiatric:        Mood and Affect: Mood normal.        Behavior: Behavior normal.      Assessment & Plan:   See Encounters Tab for problem based charting.  Patient discussed with Dr. Lynnae January

## 2018-08-11 ENCOUNTER — Ambulatory Visit
Admission: RE | Admit: 2018-08-11 | Discharge: 2018-08-11 | Disposition: A | Discharge status: Home / Self Care | Payer: Medicare HMO | Source: Ambulatory Visit | Attending: Student in an Organized Health Care Education/Training Program | Admitting: Student in an Organized Health Care Education/Training Program

## 2018-08-11 ENCOUNTER — Other Ambulatory Visit: Payer: Self-pay | Admitting: Student in an Organized Health Care Education/Training Program

## 2018-08-11 DIAGNOSIS — N6452 Nipple discharge: Secondary | ICD-10-CM

## 2018-08-11 DIAGNOSIS — N6320 Unspecified lump in the left breast, unspecified quadrant: Secondary | ICD-10-CM

## 2018-08-17 ENCOUNTER — Telehealth: Payer: Self-pay | Admitting: *Deleted

## 2018-08-17 NOTE — Telephone Encounter (Signed)
CALLED LEFT VOICE MESSAGE FOR PATIENT REGARDING HER ENT REFERRAL TO SCHEDULE WITH DR CROSSLEY , [employer], NEED TO CALL THEIR OFFICE.  SENT YOUR REFERRAL TO [facility] FOR REVIEW HOPING THEY WILL SEE YOU. PLEASE GIVE DR CROSSLEY OFFICE A CALL.

## 2018-08-18 ENCOUNTER — Telehealth: Payer: Self-pay | Admitting: *Deleted

## 2018-08-18 NOTE — Telephone Encounter (Signed)
LVM FOR PATIENT TO CALL THE ENT OFFICE TO SET UP HER APPOINTMENT. TO CONTACT THEM AT [phone].

## 2018-08-20 ENCOUNTER — Telehealth: Payer: Self-pay | Admitting: *Deleted

## 2018-08-20 NOTE — Telephone Encounter (Signed)
-----   Message from Sid Falcon, MD sent at 08/19/2018  2:13 PM EST ----- Can you please call Sheri [name] and get her follow up for work up of galactorrhea.  She will also need to sign a medical release form for records from [employer].   Would prefer sooner rather than later (February).  Can do [facility] if cannot get in to see me.   Thanks  EBM

## 2018-08-20 NOTE — Telephone Encounter (Signed)
Called pt - need f/u appt for galactorrhea (problem w/her breast) . Stated she had her mammogram and ultrasound done and they were ok. But if Dr Daryll Drown still wants her to make an appt, she will.

## 2018-08-20 NOTE — Telephone Encounter (Signed)
Called pt - explained Dr Daryll Drown would like for her to be eval for the breast discharge. Stated she had the mammo/u/s, everything is ok; she will not be able to come now; she has other appts.;she does not want to waste time/money by coming now. She scheduled an appt for 3/11 w/Dr Daryll Drown.

## 2018-08-20 NOTE — Telephone Encounter (Signed)
I would like her to make an appointment.  Breast discharge is abnormal at her age and needs to be evaluated.  There are other causes that are not breast Cancer.  Thanks!

## 2018-08-24 NOTE — Telephone Encounter (Signed)
Okay.  I will see her in March and further discuss with her.  Thanks!

## 2018-09-08 ENCOUNTER — Ambulatory Visit: Payer: Medicare HMO

## 2018-09-09 ENCOUNTER — Other Ambulatory Visit: Payer: Medicare HMO

## 2018-09-09 ENCOUNTER — Ambulatory Visit: Payer: Medicare HMO

## 2018-09-10 ENCOUNTER — Ambulatory Visit: Payer: Medicare HMO

## 2018-09-13 ENCOUNTER — Ambulatory Visit (INDEPENDENT_AMBULATORY_CARE_PROVIDER_SITE_OTHER): Payer: Medicare HMO | Admitting: Internal Medicine

## 2018-09-13 ENCOUNTER — Other Ambulatory Visit: Payer: Self-pay

## 2018-09-13 VITALS — BP 114/69 | HR 72 | Temp 98.2°F | Wt 146.2 lb

## 2018-09-13 DIAGNOSIS — R413 Other amnesia: Secondary | ICD-10-CM | POA: Diagnosis not present

## 2018-09-13 DIAGNOSIS — F329 Major depressive disorder, single episode, unspecified: Secondary | ICD-10-CM

## 2018-09-13 DIAGNOSIS — F419 Anxiety disorder, unspecified: Secondary | ICD-10-CM

## 2018-09-13 DIAGNOSIS — R066 Hiccough: Secondary | ICD-10-CM

## 2018-09-13 DIAGNOSIS — R590 Localized enlarged lymph nodes: Secondary | ICD-10-CM

## 2018-09-13 DIAGNOSIS — R599 Enlarged lymph nodes, unspecified: Secondary | ICD-10-CM

## 2018-09-13 DIAGNOSIS — K0889 Other specified disorders of teeth and supporting structures: Secondary | ICD-10-CM | POA: Diagnosis not present

## 2018-09-13 NOTE — Patient Instructions (Signed)
[name],   Please make sure to follow up with [facility] and discuss your symptoms with them as they can be related to ongoing stress or side effects of your medications.   For the gland in your  Mouth we recommend you follow up with a dentist that can do a more thorough evaluation of your mouth.   Call us if you have any questions or concerns.  -Dr. Frederico Hamman

## 2018-09-14 ENCOUNTER — Encounter: Payer: Self-pay | Admitting: Internal Medicine

## 2018-09-14 ENCOUNTER — Telehealth: Payer: Self-pay | Admitting: *Deleted

## 2018-09-14 ENCOUNTER — Encounter: Payer: Self-pay | Admitting: Cardiology

## 2018-09-14 DIAGNOSIS — R599 Enlarged lymph nodes, unspecified: Secondary | ICD-10-CM | POA: Insufficient documentation

## 2018-09-14 DIAGNOSIS — R066 Hiccough: Secondary | ICD-10-CM | POA: Insufficient documentation

## 2018-09-14 HISTORY — DX: Enlarged lymph nodes, unspecified: R59.9

## 2018-09-14 MED ORDER — PANTOPRAZOLE SODIUM 20 MG PO TBEC: 20.0000 mg | DELAYED_RELEASE_TABLET | Freq: Every day | ORAL | 1 refills | Status: DC

## 2018-09-14 NOTE — Telephone Encounter (Signed)
Spoke with pt and paramedic that is currently at her home. On arrival, per paramedic the pt was in SVT rate of 591, systolic pressure 84. Adenosine 6 mg was given. Current vitals 114/70 hr 100 and pt states she is feeling better now. Pt is declining to go to the ER.  Pt requesting to be seen by Dr [name] instead.  Dr [name] was updated and stated the pt can be seen on next available fit in appointment. Lorenda Hatchet called the pt and left a message with an appointment to be seen 09/15/2018 @ 12 noon. She left her desk number for the pt to call back if appointment was unacceptable.

## 2018-09-14 NOTE — Progress Notes (Signed)
   CC: Memory loss, swollen gland in mouth, and hiccups   HPI:  Ms.Myan H Rey is a 68 y.o. year-old female with PMH listed below who presents to clinic for evaluation of memory loss, swollen gland in mouth, and hiccups. Please see problem based assessment and plan for further details.   Past Medical History:  Diagnosis Date  . Allergic rhinitis   . Anxiety   . Atrial fibrillation with RVR ([place])   . Cancer ([place])    left lower eyelid, basal cell cancer  . Chronic sinusitis   . COPD (chronic obstructive pulmonary disease) ([place])   . DEGENERATIVE JOINT DISEASE, HANDS    several images revealing OA  . Depression    treated at [facility]  . GERD (gastroesophageal reflux disease)   . Goiter    hx of goiter  . Hx of atrial tachycardia since age 11 03/15/2013  . Palpitations    Associated with difficulty breathing  . Panic attacks   . Poisoning by selective serotonin reuptake inhibitors(969.03) Jan 2007   severe reaction with MAOI (nardil) and dextromethorphan  . Tachycardia    Review of Systems:   Review of Systems  Constitutional: Negative for chills, fever and malaise/fatigue.  HENT: Negative for sore throat.   Respiratory: Negative for stridor.   Psychiatric/Behavioral: Positive for depression and memory loss. Negative for suicidal ideas.    Physical Exam: Vitals:   09/13/18 1454  BP: 114/69  Pulse: 72  Temp: 98.2 F (36.8 C)  TempSrc: Oral  SpO2: 97%  Weight: 146 lb 3.2 oz (66.3 kg)   General: well-appearing elderly female in no acute distress  HENT: neck supple and FROM, no lymphadenopathy, OP clear without exudates or erythema, poor dentition  Neuro: A&Ox3, CN II-XII intact, sensation intact in all four extremities, no motor deficits, no dysmetria     Office Visit from 09/13/2018 in [place]  PHQ-9 Total Score  14      Assessment & Plan:   See Encounters Tab for problem based charting.  Patient discussed with Dr.  Daryll Drown

## 2018-09-14 NOTE — Assessment & Plan Note (Signed)
Sheri [name] reports a one-month history of intermittent hiccups episodes that last for several minutes. These episodes do not wake her from sleep, but do cause diffuse chest wall pain during the day when they are prolonged.  She denies recent illness, shortness of breath, reflux symptoms, and recent changes in medications.  Recommended a trial of proton pump inhibitors as even in the absence of classic reflux symptoms, empiric antireflux treatment should be considered as first-line therapy given the favorable safety profile.  -Protonix 20 mg QD

## 2018-09-14 NOTE — Assessment & Plan Note (Signed)
Sheri [name] states she has a swollen gland on the L side of her mouth that has been present for "a while."  She has had this before and it has resolved spontaneously in the past, but she is not sure what caused it.  She has poor dentition and was advised to follow-up with her dentist by her PCP.  She has not follow-up with her dentist.  On exam, I do not appreciate any gross abnormalities of the oral mucosa.  Her oropharynx appears clear without exudates.  No lymphadenopathy noted.  She does not have any signs or symptoms concerning for an infectious etiology.  I reassured patient and discussed close monitoring for now as these have resolved spontaneously in the past.  I also recommended she follow-up with her dentist.

## 2018-09-14 NOTE — Assessment & Plan Note (Signed)
Sheri [name] presents after 2 episodes of memory loss that occurred 1 week ago while she was driving. She recalls getting in her car and starting to drive. After that, she remembers being at her final destination, but is unable recall how she got there. This happened again on her way back home. She denies ever forgetting people she knows or conversations, and is able to perform her ADLs independently. It appears she had a similar episode 2 years ago that was attributed to worsening stress, but she denies similar symptoms in the past.  At that time she scored a 4/5 on the mini cog.  We did not perform a MOCA exam today due to lack of time, but she scored 5/5 on the mini-cog today. She does report being wrongfully let go from her recent job which has caused increasing stress in her life as she has difficulty paying her bills already.  I suspect this episode may be due to recent stressors in her life.  She follows up at [facility] for depression and anxiety and I recommended follow-up with them to check if she needs adjustment in her medications. PHQ-9 14. She will also need a Moca exam in the future.  Low suspicion for central pathology such as CVA in the setting of a normal neurological exam.

## 2018-09-15 ENCOUNTER — Ambulatory Visit (INDEPENDENT_AMBULATORY_CARE_PROVIDER_SITE_OTHER): Payer: Medicare HMO | Admitting: Cardiology

## 2018-09-15 ENCOUNTER — Encounter: Payer: Self-pay | Admitting: Cardiology

## 2018-09-15 VITALS — BP 122/70 | HR 80 | Ht 65.5 in | Wt 144.0 lb

## 2018-09-15 DIAGNOSIS — I471 Supraventricular tachycardia: Secondary | ICD-10-CM

## 2018-09-15 MED ORDER — DILTIAZEM HCL ER COATED BEADS 180 MG PO CP24: 180.0000 mg | ORAL_CAPSULE | Freq: Every day | ORAL | 3 refills | Status: DC

## 2018-09-15 NOTE — Patient Instructions (Addendum)
Medication Instructions:  Your physician has recommended you make the following change in your medication: 1. STOP Propranolol 2. START Diltiazem 180 mg daily  * If you need a refill on your cardiac medications before your next appointment, please call your pharmacy.   Labwork: None ordered  Testing/Procedures: Your physician has recommended that you have an ablation. Catheter ablation is a medical procedure used to treat some cardiac arrhythmias (irregular heartbeats). During catheter ablation, a long, thin, flexible tube is put into a blood vessel in your groin (upper thigh), or neck. This tube is called an ablation catheter. It is then guided to your heart through the blood vessel. Radio frequency waves destroy small areas of heart tissue where abnormal heartbeats may cause an arrhythmia to start.  The nurse will call you to schedule this procedure.  Instructions for your ablation: 1. Please arrive at the [facility], Main Entrance "A", of [facility] at _______ on _______. 2. Do not eat or drink after midnight the night prior to the procedure.  3. Do not take any medications the morning of the procedure. 4. Plan for an overnight stay in the hospital. 5. You will need someone to drive you home at discharge.   Follow-Up: To be determined once ablation is scheduled  Thank you for choosing [facility]!!   [name], RN [phone]  Any Other Special Instructions Will Be Listed Below (If Applicable).  Diltiazem tablets What is this medicine? DILTIAZEM (dil TYE a zem) is a calcium-channel blocker. It affects the amount of calcium found in your heart and muscle cells. This relaxes your blood vessels, which can reduce the amount of work the heart has to do. This medicine is used to treat chest pain caused by angina. This medicine may be used for other purposes; ask your health care provider or pharmacist if you have questions. COMMON BRAND NAME(S): Cardizem What  should I tell my health care provider before I take this medicine? They need to know if you have any of these conditions: -heart problems, low blood pressure, irregular heartbeat -liver disease -previous heart attack -an unusual or allergic reaction to diltiazem, other medicines, foods, dyes, or preservatives -pregnant or trying to get pregnant -breast-feeding How should I use this medicine? Take this medicine by mouth with a glass of water. Follow the directions on the prescription label. Do not cut, crush or chew this medicine. This medicine is usually taken before meals and at bedtime. Take your doses at regular intervals. Do not take your medicine more often then directed. Do not stop taking except on the advice of your doctor or health care professional. Talk to your pediatrician regarding the use of this medicine in children. Special care may be needed. Overdosage: If you think you have taken too much of this medicine contact a poison control center or emergency room at once. NOTE: This medicine is only for you. Do not share this medicine with others. What if I miss a dose? If you miss a dose, take it as soon as you can. If it is almost time for your next dose, take only that dose. Do not take double or extra doses. What may interact with this medicine? Do not take this medicine with any of the following: -cisapride -hawthorn -pimozide -ranolazine -red yeast rice This medicine may also interact with the following medications: -buspirone -carbamazepine -cimetidine -cyclosporine -digoxin -local anesthetics or general anesthetics -lovastatin -medicines for anxiety or difficulty sleeping like midazolam and triazolam -medicines for high blood  pressure or heart problems -quinidine -rifampin, rifabutin, or rifapentine This list may not describe all possible interactions. Give your health care provider a list of all the medicines, herbs, non-prescription drugs, or dietary supplements  you use. Also tell them if you smoke, drink alcohol, or use illegal drugs. Some items may interact with your medicine. What should I watch for while using this medicine? Check your blood pressure and pulse rate regularly. Ask your doctor or health care professional what your blood pressure and pulse rate should be and when you should contact him or her. You may feel dizzy or lightheaded. Do not drive, use machinery, or do anything that needs mental alertness until you know how this medicine affects you. To reduce the risk of dizzy or fainting spells, do not sit or stand up quickly, especially if you are an older patient. Alcohol can make you more dizzy or increase flushing and rapid heartbeats. Avoid alcoholic drinks. What side effects may I notice from receiving this medicine? Side effects that you should report to your doctor or health care professional as soon as possible: -allergic reactions like skin rash, itching or hives, swelling of the face, lips, or tongue -confusion, mental depression -feeling faint or lightheaded, falls -pinpoint red spots on the skin -redness, blistering, peeling or loosening of the skin, including inside the mouth -slow, irregular heartbeat -swelling of the ankles, feet -unusual bleeding or bruising Side effects that usually do not require medical attention (report to your doctor or health care professional if they continue or are bothersome): -change in sex drive or performance -constipation or diarrhea -flushing of the face -headache -nausea, vomiting -tired or weak -trouble sleeping This list may not describe all possible side effects. Call your doctor for medical advice about side effects. You may report side effects to FDA at 1-800-FDA-1088. Where should I keep my medicine? Keep out of the reach of children. Store at room temperature between 20 and 25 degrees C (68 and 77 degrees F). Protect from light. Keep container tightly closed. Throw away any unused  medicine after the expiration date. NOTE: This sheet is a summary. It may not cover all possible information. If you have questions about this medicine, talk to your doctor, pharmacist, or health care provider.  2019 Elsevier/Gold Standard (2013-06-20 10:54:31)

## 2018-09-15 NOTE — Progress Notes (Signed)
Electrophysiology Office Note   Date:  09/15/2018   ID:  Sheri [name], Sheri [name] February 17, 1951, MRN [id number]  PCP:  Sid Falcon, MD  Cardiologist:  Debara Pickett Primary Electrophysiologist:  Sheri Dillingham Sheri Leeds, MD    No chief complaint on file.    History of Present Illness: Sheri [name] is a 68 y.o. female who presents today for electrophysiology evaluation.   She has a history of atrial fibrillation.    She admitted in 02/2013 for A fib with RVR she was placed on procainamide drip. She then converted to SR. She had echo done which was generally normal. She does report 2 of her sisters with the same disorder - they have both undergone a-fib ablations which have successfully terminated their arrhythmias. She is treated for anxiety and panic attacks with seroquel, trazadone, effexor and xanax.  He had an atrial fibrillation ablation 10/12/2015.  Today, denies symptoms of chest pain, shortness of breath, orthopnea, PND, lower extremity edema, claudication, dizziness, presyncope, syncope, bleeding, or neurologic sequela. The patient is tolerating medications without difficulties.  She had an episode of SVT yesterday.  She called EMS which showed a narrow complex tachycardia on her ECG.  She was given 6 mg of adenosine which converted her to sinus rhythm.  She presents today for follow-up.  She feels well today without complaints.   Past Medical History:  Diagnosis Date  . Allergic rhinitis   . Anxiety   . Atrial fibrillation with RVR ([place])   . Cancer ([place])    left lower eyelid, basal cell cancer  . Chronic sinusitis   . COPD (chronic obstructive pulmonary disease) ([place])   . DEGENERATIVE JOINT DISEASE, HANDS    several images revealing OA  . Depression    treated at [facility]  . GERD (gastroesophageal reflux disease)   . Goiter    hx of goiter  . Hx of atrial tachycardia since age 14 03/15/2013  . Palpitations    Associated with difficulty breathing  . Panic attacks   . Poisoning  by selective serotonin reuptake inhibitors(969.03) Jan 2007   severe reaction with MAOI (nardil) and dextromethorphan  . Tachycardia    Past Surgical History:  Procedure Laterality Date  . APPENDECTOMY    . COLONOSCOPY    . ELECTROPHYSIOLOGIC STUDY N/A 10/12/2015   Procedure: Atrial Fibrillation Ablation;  Surgeon: Sheri Kulzer Sheri Leeds, MD;  Location: [facility];  Service: Cardiovascular;  Laterality: N/A;  . LID LESION EXCISION Left 08/06/2017   Procedure: EXCISION BIOPSY OF EYELID, RECONSTRUCTION OF EYELID;  Surgeon: Sheri Bernhardt, MD;  Location: [place];  Service: Ophthalmology;  Laterality: Left;  . OVARIAN CYST REMOVAL  march 1969   left ovarian cystectomy  . right mastoidectomy     ET tube placement in 1980 and 1990  . right sinus surgery  october 2000  . THYROIDECTOMY, PARTIAL     nodule removal - April 1996     Current Outpatient Medications  Medication Sig Dispense Refill  . albuterol (PROVENTIL HFA;VENTOLIN HFA) 108 (90 Base) MCG/ACT inhaler Inhale 1-2 puffs into the lungs every 6 (six) hours as needed for wheezing or shortness of breath. 1 Inhaler 0  . diltiazem (CARDIZEM) 30 MG tablet Take 1 tablet (30 mg total) by mouth every 4 (four) hours as needed (for Afib). 30 tablet 1  . meclizine (ANTIVERT) 25 MG tablet Take 1 tablet (25 mg total) by mouth 3 (three) times daily as needed for dizziness. 20 tablet 0  .  Multiple Vitamin (MULTIVITAMIN WITH MINERALS) TABS tablet Take 1 tablet by mouth daily.    . pantoprazole (PROTONIX) 20 MG tablet Take 1 tablet (20 mg total) by mouth daily. 30 tablet 1  . propranolol (INDERAL) 10 MG tablet Take 1 tablet (10 mg total) by mouth 2 (two) times daily. 90 tablet 1  . QUEtiapine (SEROQUEL) 200 MG tablet Take 100 mg by mouth at bedtime.    . traZODone (DESYREL) 100 MG tablet Take 100 mg by mouth at bedtime.    Marland Kitchen venlafaxine (EFFEXOR) 75 MG tablet Take 75 mg by mouth 3 (three) times daily with meals.      No current  facility-administered medications for this visit.     Allergies:   Guaifenesin   Social History:  The patient  reports that she has never smoked. She has never used smokeless tobacco. She reports that she does not drink alcohol or use drugs.   Family History:  The patient's family history includes Atrial fibrillation in her sister and sister; Heart attack in her father; Hypertension in her father and mother; Psoriasis in her father.    ROS:  Please see the history of present illness.   Otherwise, review of systems is positive for palpitations, depression, anxiety.   All other systems are reviewed and negative.   PHYSICAL EXAM: VS:  There were no vitals taken for this visit. , BMI There is no height or weight on file to calculate BMI. GEN: Well nourished, well developed, in no acute distress  HEENT: normal  Neck: no JVD, carotid bruits, or masses Cardiac: RRR; no murmurs, rubs, or gallops,no edema  Respiratory:  clear to auscultation bilaterally, normal work of breathing GI: soft, nontender, nondistended, + BS MS: no deformity or atrophy  Skin: warm and dry Neuro:  Strength and sensation are intact Psych: euthymic mood, full affect  EKG:  EKG is not ordered today. Personal review of the ekg ordered 09/14/18 from EMS records shows SVT converted to sinus rhythm  Recent Labs: 01/26/2018: ALT 12; Platelets 382; TSH 2.540 06/01/2018: BUN 10; Creatinine, Ser 0.50; Hemoglobin 11.9; Potassium 3.6; Sodium 141    Lipid Panel     Component Value Date/Time   CHOL 111 03/16/2013 0355   TRIG 70 03/16/2013 0355   HDL 39 (L) 03/16/2013 0355   CHOLHDL 2.8 03/16/2013 0355   VLDL 14 03/16/2013 0355   LDLCALC 58 03/16/2013 0355     Wt Readings from Last 3 Encounters:  09/13/18 146 lb 3.2 oz (66.3 kg)  08/06/18 139 lb 3.2 oz (63.1 kg)  06/09/18 135 lb 12.8 oz (61.6 kg)      Other studies Reviewed: Additional studies/ records that were reviewed today include: TTE 2014  Review of the above  records today demonstrates:  - Left ventricle: The cavity size was normal. Wall thickness was increased in a pattern of mild LVH. Systolic function was normal. The estimated ejection fraction was in the range of 55% to 60%. Wall motion was normal; there were no regional wall motion abnormalities. Left ventricular diastolic function parameters were normal. - Left atrium: The atrium was normal in size. - Tricuspid valve: Poorly visualized. Trivial regurgitation. - Pulmonary arteries: PA peak pressure: 85mm Hg (S). - Inferior vena cava: The vessel was normal in size; the respirophasic diameter changes were in the normal range  50%); findings are consistent with normal central venous pressure.   ASSESSMENT AND PLAN:  1.  Paroxysmal atrial fibrillation: Status post ablation 10/12/2015.  No further episodes of  atrial fibrillation.  Not anticoagulated.  No changes.  This patients CHA2DS2-VASc Score and unadjusted Ischemic Stroke Rate (% per year) is equal to 2.2 % stroke rate/year from a score of 2  Above score calculated as 1 point each if present [CHF, HTN, DM, Vascular=MI/PAD/Aortic Plaque, Age if 65-74, or Female] Above score calculated as 2 points each if present [Age > 75, or Stroke/TIA/TE]   2.  SVT: Responded to 6 mg of adenosine and likely due to AVNRT, though ORT cannot be completely ruled out.  She would prefer to have ablation.  Risks and benefits were discussed.  Risks include bleeding, tamponade, heart block, stroke.  She understands these risks and is agreed to the procedure.  In the interim, we Keyonni Percival stop her propranolol and start her on diltiazem.     Current medicines are reviewed at length with the patient today.   The patient does not have concerns regarding her medicines.  The following changes were made today: Stop propranolol, start diltiazem  Labs/ tests ordered today include:  No orders of the defined types were placed in this  encounter.    Disposition:   FU with Rebekha Diveley 1 months  Signed, Darlena Koval Sheri Leeds, MD  09/15/2018 12:06 PM     [place] [address] [place] [address] [phone] (office) [phone] (fax)

## 2018-09-17 NOTE — Progress Notes (Signed)
Internal Medicine Clinic Attending  Case discussed with Dr. Santos-Sanchez at the time of the visit.  We reviewed the resident's history and exam and pertinent patient test results.  I agree with the assessment, diagnosis, and plan of care documented in the resident's note.    

## 2018-09-20 ENCOUNTER — Telehealth: Payer: Self-pay | Admitting: Cardiology

## 2018-09-20 NOTE — Telephone Encounter (Signed)
New Message   Patient is calling to schedule her cardiac ablation. Please call.

## 2018-09-20 NOTE — Telephone Encounter (Signed)
Pt informed that I would be in touch this week to discuss a date for procedure

## 2018-09-21 NOTE — Telephone Encounter (Signed)
Pt would like to schedule ablation for 4/7. Pt aware I will arrange and call her this week/next to review instructions  Pt agreeable to plan.

## 2018-09-22 NOTE — Telephone Encounter (Signed)
Ablation scheduled for 4/7 Procedure instructions reviewed w/ pt Pt aware to stop Diltiazem 3 days prior to ablation (take last dose on 4/3) Post procedure follow up scheduled. Patient verbalized understanding and agreeable to plan.

## 2018-09-27 ENCOUNTER — Other Ambulatory Visit: Payer: Self-pay | Admitting: Internal Medicine

## 2018-09-29 ENCOUNTER — Encounter: Payer: Medicare HMO | Admitting: Internal Medicine

## 2018-09-30 DIAGNOSIS — H9313 Tinnitus, bilateral: Secondary | ICD-10-CM | POA: Insufficient documentation

## 2018-10-01 ENCOUNTER — Other Ambulatory Visit: Payer: Medicare HMO

## 2018-10-08 NOTE — Telephone Encounter (Signed)
Pt made aware procedure cancelled d/t COVID-19 precautions. Advised to call the office if she has issues with her SVT/AFib until we can get her rescheduled. Pt agreeable to plan.  She is aware I will follow up when we can reschedule this procedure.

## 2018-10-25 ENCOUNTER — Other Ambulatory Visit: Payer: Self-pay | Admitting: Cardiology

## 2018-10-25 ENCOUNTER — Telehealth: Payer: Self-pay | Admitting: Cardiology

## 2018-10-25 NOTE — Telephone Encounter (Signed)
Pt calling in stating she needs refills on Propranolol 10 mg BID.   She states, "the Diltiazem does not work for me and Propranolol does".  States that "Dr. [name] stopped the Diltiazem at the last visit and put me back on Propranolol". Informed pt that according to last office note she was to stop Propranolol and start Diltiazem but pt is insistent this is wrong and that Diltiazem does not work for her. Pt aware I will forward to [facility] for advisement and call her by tomorrow.  She has about 4/5 days left of medication.   Dr. [name] -- is it ok to send in for Propranolol and stop Diltiazem until procedure can be rescheduled post COVID-19 pandemic?

## 2018-10-25 NOTE — Telephone Encounter (Signed)
Pt calling requesting a refill on propanolol. This medication was D/C at [facility] on 09/15/18 by Dr.Camnitz and pt was started on Diltiazem. Pt stated that this medication does not work and Dr. [name] said that she would stay on propanolol. Pt would like a call back as soon as possible. Pt states that she is almost out. Please address

## 2018-10-26 ENCOUNTER — Ambulatory Visit (HOSPITAL_COMMUNITY): Admit: 2018-10-26 | Payer: Medicare HMO | Admitting: Cardiology

## 2018-10-26 ENCOUNTER — Encounter (HOSPITAL_COMMUNITY): Payer: Self-pay

## 2018-10-26 SURGERY — SVT ABLATION

## 2018-10-26 MED ORDER — PROPRANOLOL HCL 10 MG PO TABS: 10.0000 mg | ORAL_TABLET | Freq: Two times a day (BID) | ORAL | 1 refills | Status: DC

## 2018-10-26 NOTE — Telephone Encounter (Addendum)
Dr. [name] approved restarting Propranolol while her procedure is put on hold d/t COVID-19. Will remove Diltiazem from her list as she is not taking it. Rx sent to [facility] per pt request.  Pt understands I will call her to reschedule when safe to do so.  Pt agreeable to calling the office, in the meantime, if she has PAF/SVT issues. Patient verbalized understanding and agreeable to plan.

## 2018-10-28 ENCOUNTER — Other Ambulatory Visit: Payer: Medicare HMO

## 2018-11-11 ENCOUNTER — Telehealth: Payer: Self-pay

## 2018-11-11 NOTE — Telephone Encounter (Signed)
Requesting to speak with a nurse about swollen gland on the neck. Please call pt back.

## 2018-11-11 NOTE — Telephone Encounter (Signed)
Televisit please.

## 2018-11-11 NOTE — Telephone Encounter (Signed)
Scheduled for telehealth appt 4/24 at [place]

## 2018-11-11 NOTE — Telephone Encounter (Signed)
Pt calls and states her L tonsil is swollen, red, and very tender, this has been ongoing for 7 days, has happened before, she states "they always just call abx in for me, I just call and they send it in, I dont have to answer any questions" she states she has been using salt water gargles, listerine and tylenol. Denies fevers, cough, drainage. Please advise [phone]

## 2018-11-12 ENCOUNTER — Ambulatory Visit (INDEPENDENT_AMBULATORY_CARE_PROVIDER_SITE_OTHER): Payer: Medicare HMO | Admitting: Internal Medicine

## 2018-11-12 ENCOUNTER — Other Ambulatory Visit: Payer: Self-pay

## 2018-11-12 DIAGNOSIS — J039 Acute tonsillitis, unspecified: Secondary | ICD-10-CM

## 2018-11-12 HISTORY — DX: Acute tonsillitis, unspecified: J03.90

## 2018-11-12 MED ORDER — AMOXICILLIN-POT CLAVULANATE 875-125 MG PO TABS: 1.0000 | ORAL_TABLET | Freq: Two times a day (BID) | ORAL | 0 refills | Status: DC

## 2018-11-12 NOTE — Progress Notes (Addendum)
   [facility] Telephone Encounter  Reason for call:   This telephone encounter was created for [name] on 11/12/2018 for the following purpose/cc Tonsillitis .   Pertinent Data:   Hx of afib, depression, anxiety, dental infections, lymphadenopathy ROS: Pulmonary: pt denies increased work of breathing, shortness of breath,  Cardiac: pt denies palpitations, chest pain,   Abdominal: pt denies abdominal pain, nausea, vomiting, or diarrhea   Assessment / Plan / Recommendations:   Tonsillitis: per pt her right tonsil has been swollen x1 week.  She  has been gargling with warm salt water and listerine but with no relief.  She has been treated with abx in the past with improvement.  She does have some lymphadenopathy submandibular. Saw an ENT about two months ago [facility] ear nose and throat for tinnitus and hearing difficulties. She has not seen a dentist so far.  She has no fever, no difficulty breathing, no difficulty swallowing but it is painful.  No skin changes around the face or neck.  Denies any tooth pain or visible changes around the gums or teeth.     P: 10 day course of augmentin  Pt promises to reschedule her follow up appointment with ENT to discuss her recurrent tonsilitis   As always, pt is advised that if symptoms worsen or new symptoms arise, they should go to an urgent care facility or to to ER for further evaluation.   Consent and Medical Decision Making:   Patient discussed with Dr. Beryle Beams  This is a telephone encounter between Sheri [name] and Sheri [name] on 11/12/2018 for tonsillitis. The visit was conducted with the patient located at home and Sheri [name] at [facility]. The patient's identity was confirmed using their DOB and current address. The patient has consented to being evaluated through a telephone encounter and understands the associated risks (an examination cannot be done and the patient may need to come in for an  appointment) / benefits (allows the patient to remain at home, decreasing exposure to coronavirus). I personally spent 14 minutes on medical discussion.

## 2018-11-12 NOTE — Assessment & Plan Note (Signed)
   Tonsillitis: per pt her right tonsil has been swollen x1 week.  She  has been gargling with warm salt water and listerine but with no relief.  She has been treated with abx in the past with improvement.  She does have some lymphadenopathy submandibular. Saw an ENT about two months ago [facility] ear nose and throat for tinnitus and hearing difficulties. She has not seen a dentist so far.  She has no fever, no difficulty breathing, no difficulty swallowing but it is painful.  No skin changes around the face or neck.  Denies any tooth pain or visible changes around the gums or teeth.     P: 10 day course of augmentin  Pt promises to reschedule her follow up appointment with ENT to discuss her recurrent tonsilitis

## 2018-11-12 NOTE — Progress Notes (Signed)
Medicine attending: Medical history, presenting problems,  and medications, reviewed with resident physician Dr Guadlupe Spanish on the day of the patient telephone consultation and I concur with his evaluation and management plan. Hx of sinusitis, Meniere's dis, dental pain, now states she has chronic tonsilitis. Has been followed by ENT. Now c/o sore right tonsil. No fever; odynophagia without dysphagia. We will Rx Augmentin. Advised to F/U w ENT. Likely needs tonsilectomy.

## 2018-11-24 DIAGNOSIS — F332 Major depressive disorder, recurrent severe without psychotic features: Secondary | ICD-10-CM | POA: Diagnosis not present

## 2018-11-30 ENCOUNTER — Ambulatory Visit: Payer: Medicare HMO | Admitting: Cardiology

## 2018-12-22 ENCOUNTER — Encounter: Payer: Medicare HMO | Admitting: Internal Medicine

## 2019-01-05 ENCOUNTER — Other Ambulatory Visit: Payer: Medicare HMO

## 2019-01-13 ENCOUNTER — Other Ambulatory Visit: Payer: Self-pay | Admitting: *Deleted

## 2019-01-13 ENCOUNTER — Telehealth: Payer: Self-pay | Admitting: Internal Medicine

## 2019-01-13 NOTE — Telephone Encounter (Signed)
Pt calling to report left side of body is getting smaller.  Pt concerned about her memory loss yesterday while driving. Transferred call to [place].

## 2019-01-13 NOTE — Progress Notes (Signed)
Pt states one side of her body is smaller than the other, like she is missing muscle mass. Denies weakness, tingling, heaviness She does state she has for a few months had "little black out episodes where she doesn't know what she is doing and cant respond. She desires an appt appt tues 6/30 at 0945 [facility]

## 2019-01-18 ENCOUNTER — Ambulatory Visit: Payer: Medicare HMO

## 2019-01-19 ENCOUNTER — Ambulatory Visit: Payer: Medicare HMO

## 2019-01-20 ENCOUNTER — Other Ambulatory Visit: Payer: Self-pay | Admitting: Cardiology

## 2019-01-20 ENCOUNTER — Other Ambulatory Visit: Payer: Self-pay

## 2019-01-20 MED ORDER — PROPRANOLOL HCL 10 MG PO TABS: 10.0000 mg | ORAL_TABLET | Freq: Two times a day (BID) | ORAL | 2 refills | Status: DC

## 2019-01-20 NOTE — Telephone Encounter (Signed)
Patient called back because she will be out of medication tomorrow. Please call her once the refill request has been sent to the pharmacy

## 2019-01-20 NOTE — Telephone Encounter (Signed)
New Message              *STAT* If patient is at the pharmacy, call can be transferred to refill team.   1. Which medications need to be refilled? (please list name of each medication and dose if known) Propanalol  2. Which pharmacy/location (including street and city if local pharmacy) is medication to be sent to? [place]  3. Do they need a 30 day or 90 day supply? [place]

## 2019-01-20 NOTE — Telephone Encounter (Signed)
Spoke with pt and informed her that the prescription had been sent to the pharmacy today and noon.

## 2019-01-20 NOTE — Telephone Encounter (Signed)
Pharmacy has no record of refills on file; sent new rx to pharmacy.

## 2019-01-25 ENCOUNTER — Ambulatory Visit: Payer: Medicare HMO

## 2019-01-26 ENCOUNTER — Telehealth: Payer: Self-pay | Admitting: Cardiology

## 2019-01-26 NOTE — Telephone Encounter (Signed)
New Message ° ° ° °Left message to confirm appt and get consent  °

## 2019-01-27 ENCOUNTER — Telehealth (INDEPENDENT_AMBULATORY_CARE_PROVIDER_SITE_OTHER): Payer: Medicare HMO | Admitting: Cardiology

## 2019-01-27 ENCOUNTER — Other Ambulatory Visit: Payer: Self-pay

## 2019-01-27 DIAGNOSIS — I471 Supraventricular tachycardia: Secondary | ICD-10-CM

## 2019-01-27 MED ORDER — DILTIAZEM HCL ER COATED BEADS 180 MG PO CP24: 180.0000 mg | ORAL_CAPSULE | Freq: Every day | ORAL | 3 refills | Status: DC

## 2019-01-27 NOTE — Progress Notes (Signed)
Electrophysiology TeleHealth Note   Due to national recommendations of social distancing due to COVID 19, an audio/video telehealth visit is felt to be most appropriate for this patient at this time.  See Epic message for the patient's consent to telehealth for [facility].   Date:  01/27/2019   ID:  Sheri [name], DOB 1950-08-23, MRN [id number]  Location: patient's home  Provider location: [address], [place]  Evaluation Performed: Follow-up visit  PCP:  Sid Falcon, MD  Cardiologist:  [facility] Electrophysiologist:  Dr [name]  Chief Complaint: Atrial fibrillation, SVT  History of Present Illness:    Sheri [name] is a 68 y.o. female who presents via audio/video conferencing for a telehealth visit today.  Since last being seen in our clinic, the patient reports doing very well.  Today, she denies symptoms of palpitations, chest pain, shortness of breath,  lower extremity edema, dizziness, presyncope, or syncope.  The patient is otherwise without complaint today.  The patient denies symptoms of fevers, chills, cough, or new SOB worrisome for COVID 19.  She has history of atrial fibrillation status post ablation 10/12/2015.  She also has had episodes of SVT which appeared to be a narrow complex tachycardia.  This is been converted in the past with adenosine.  Today, denies symptoms of palpitations, chest pain, shortness of breath, orthopnea, PND, lower extremity edema, claudication, dizziness, presyncope, syncope, bleeding, or neurologic sequela. The patient is tolerating medications without difficulties.  She is continued to have episodes of SVT.  She had taken her as needed diltiazem once, but this did not help her symptoms.  Her symptoms occur multiple times a week without exacerbating or alleviating factors and last for minutes to an hour.  Past Medical History:  Diagnosis Date  . Allergic rhinitis   . Anxiety   . Atrial fibrillation with RVR ([place])   . Cancer  ([place])    left lower eyelid, basal cell cancer  . Chronic sinusitis   . COPD (chronic obstructive pulmonary disease) ([place])   . DEGENERATIVE JOINT DISEASE, HANDS    several images revealing OA  . Depression    treated at [facility]  . GERD (gastroesophageal reflux disease)   . Goiter    hx of goiter  . Hx of atrial tachycardia since age 44 03/15/2013  . Palpitations    Associated with difficulty breathing  . Panic attacks   . Poisoning by selective serotonin reuptake inhibitors(969.03) Jan 2007   severe reaction with MAOI (nardil) and dextromethorphan  . Tachycardia     Past Surgical History:  Procedure Laterality Date  . APPENDECTOMY    . COLONOSCOPY    . ELECTROPHYSIOLOGIC STUDY N/A 10/12/2015   Procedure: Atrial Fibrillation Ablation;  Surgeon: Tad Fancher [name], MD;  Location: [facility];  Service: Cardiovascular;  Laterality: N/A;  . LID LESION EXCISION Left 08/06/2017   Procedure: EXCISION BIOPSY OF EYELID, RECONSTRUCTION OF EYELID;  Surgeon: [name], MD;  Location: [facility];  Service: Ophthalmology;  Laterality: Left;  . OVARIAN CYST REMOVAL  march 1969   left ovarian cystectomy  . right mastoidectomy     ET tube placement in 1980 and 1990  . right sinus surgery  october 2000  . THYROIDECTOMY, PARTIAL     nodule removal - April 1996    Current Outpatient Medications  Medication Sig Dispense Refill  . albuterol (PROVENTIL HFA;VENTOLIN HFA) 108 (90 Base) MCG/ACT inhaler Inhale 1-2 puffs into the lungs  every 6 (six) hours as needed for wheezing or shortness of breath. 1 Inhaler 0  . amoxicillin-clavulanate (AUGMENTIN) 875-125 MG tablet Take 1 tablet by mouth 2 (two) times daily. 20 tablet 0  . diltiazem (CARDIZEM) 30 MG tablet Take 1 tablet (30 mg total) by mouth every 4 (four) hours as needed (for Afib). 30 tablet 1  . meclizine (ANTIVERT) 25 MG tablet TAKE 1 TABLET BY MOUTH THREE TIMES DAILY AS NEEDED FOR  DIZZINESS 20 tablet 1  . Multiple  Vitamin (MULTIVITAMIN WITH MINERALS) TABS tablet Take 1 tablet by mouth daily.    . pantoprazole (PROTONIX) 20 MG tablet Take 1 tablet (20 mg total) by mouth daily. 30 tablet 1  . propranolol (INDERAL) 10 MG tablet Take 1 tablet (10 mg total) by mouth 2 (two) times daily. 180 tablet 2  . QUEtiapine (SEROQUEL) 200 MG tablet Take 100 mg by mouth at bedtime.    . traZODone (DESYREL) 100 MG tablet Take 100 mg by mouth at bedtime.    Marland Kitchen venlafaxine (EFFEXOR) 75 MG tablet Take 75 mg by mouth 3 (three) times daily with meals.      No current facility-administered medications for this visit.     Allergies:   Guaifenesin   Social History:  The patient  reports that she has never smoked. She has never used smokeless tobacco. She reports that she does not drink alcohol or use drugs.   Family History:  The patient's  family history includes Atrial fibrillation in her sister and sister; Heart attack in her father; Hypertension in her father and mother; Psoriasis in her father.   ROS:  Please see the history of present illness.   All other systems are personally reviewed and negative.    Exam:    Vital Signs:  There were no vitals taken for this visit.  no acute distress, no shortness of breath.  Labs/Other Tests and Data Reviewed:    Recent Labs: 06/01/2018: BUN 10; Creatinine, Ser 0.50; Hemoglobin 11.9; Potassium 3.6; Sodium 141   Wt Readings from Last 3 Encounters:  09/15/18 144 lb (65.3 kg)  09/13/18 146 lb 3.2 oz (66.3 kg)  08/06/18 139 lb 3.2 oz (63.1 kg)     Other studies personally reviewed: Additional studies/ records that were reviewed today include: ECG 06/01/2018 personally reviewed Review of the above records today demonstrates: Sinus rhythm    ASSESSMENT & PLAN:    1.  Paroxysmal atrial fibrillation: Status post ablation 10/12/2015.  No further episodes of atrial fibrillation.  Is currently not anticoagulated.  No changes.  .This patients CHA2DS2-VASc Score and unadjusted  Ischemic Stroke Rate (% per year) is equal to 2.2 % stroke rate/year from a score of 2  Above score calculated as 1 point each if present [CHF, HTN, DM, Vascular=MI/PAD/Aortic Plaque, Age if 65-74, or Female] Above score calculated as 2 points each if present [Age > 75, or Stroke/TIA/TE]  2.  SVT: Likely due to AVNRT.  Has responded to adenosine in the past.  Would prefer to have an ablation.  Currently on diltiazem.  She would certainly like to have an ablation performed, but she would prefer to wait until we can have family members in the hospital.  I Lillymae Duet thus start her on diltiazem 180 mg daily.  I Sharonica Kraszewski see her back in 3 months for further discussions of ablation.  She may require cardiac monitoring to make sure that she is not having any more episodes of atrial fibrillation.   COVID 19 screen  The patient denies symptoms of COVID 19 at this time.  The importance of social distancing was discussed today.  Follow-up: 3 months  Current medicines are reviewed at length with the patient today.   The patient does not have concerns regarding her medicines.  The following changes were made today: Start diltiazem 180 mg daily  Labs/ tests ordered today include:  No orders of the defined types were placed in this encounter.    Patient Risk:  after full review of this patients clinical status, I feel that they are at moderate risk at this time.  Today, I have spent 9 minutes with the patient with telehealth technology discussing SVT.    Signed, Bunnie Lederman [name], MD  01/27/2019 11:05 AM     [facility] [address] [place] [address] [phone] (office) [phone] (fax)

## 2019-01-27 NOTE — Patient Instructions (Addendum)
Called pt and discussed the following. She verbalized understanding and agrees with plan.  Medication Instructions:  Begin diltiazem, 180mg  capsule, once daily  Labwork: None ordered.   Testing/Procedures: None ordered.   Follow-Up:   You have a follow up appointment with Dr. [name] on Tues Oct 13 at 2:15pm.  Any Other Special Instructions Will Be Listed Below (If Applicable).     If you need a refill on your cardiac medications before your next appointment, please call your pharmacy.

## 2019-01-27 NOTE — Addendum Note (Signed)
Addended by: Dollene Primrose on: 01/27/2019 11:32 AM   Modules accepted: Orders

## 2019-02-01 ENCOUNTER — Other Ambulatory Visit: Payer: Self-pay

## 2019-02-01 ENCOUNTER — Ambulatory Visit (INDEPENDENT_AMBULATORY_CARE_PROVIDER_SITE_OTHER): Payer: Medicare HMO | Admitting: Internal Medicine

## 2019-02-01 ENCOUNTER — Encounter: Payer: Self-pay | Admitting: Internal Medicine

## 2019-02-01 DIAGNOSIS — R413 Other amnesia: Secondary | ICD-10-CM | POA: Diagnosis not present

## 2019-02-01 DIAGNOSIS — Q749 Unspecified congenital malformation of limb(s): Secondary | ICD-10-CM | POA: Insufficient documentation

## 2019-02-01 DIAGNOSIS — I471 Supraventricular tachycardia: Secondary | ICD-10-CM | POA: Diagnosis not present

## 2019-02-01 DIAGNOSIS — Z79899 Other long term (current) drug therapy: Secondary | ICD-10-CM | POA: Diagnosis not present

## 2019-02-01 HISTORY — DX: Unspecified congenital malformation of limb(s): Q74.9

## 2019-02-01 NOTE — Progress Notes (Signed)
   CC: memory change  HPI:  [name] is a 68 y.o. with PMH as below.   Please see A&P for assessment of the patient's acute and chronic medical conditions.    Past Medical History:  Diagnosis Date  . Allergic rhinitis   . Anxiety   . Atrial fibrillation with RVR ([place])   . Cancer ([place])    left lower eyelid, basal cell cancer  . Chronic sinusitis   . COPD (chronic obstructive pulmonary disease) ([place])   . DEGENERATIVE JOINT DISEASE, HANDS    several images revealing OA  . Depression    treated at [facility]  . GERD (gastroesophageal reflux disease)   . Goiter    hx of goiter  . Hx of atrial tachycardia since age 52 03/15/2013  . Palpitations    Associated with difficulty breathing  . Panic attacks   . Poisoning by selective serotonin reuptake inhibitors(969.03) Jan 2007   severe reaction with MAOI (nardil) and dextromethorphan  . Tachycardia    Review of Systems:   Review of Systems  Cardiovascular: Negative for chest pain, palpitations and leg swelling.  Musculoskeletal: Negative for falls, joint pain and myalgias.  Neurological: Negative for dizziness, tingling, sensory change, speech change, focal weakness, weakness and headaches.  Psychiatric/Behavioral: Positive for memory loss. Negative for suicidal ideas. The patient is not nervous/anxious.    Physical Exam:  Constitution: NAD, appears stated age  Eyes: eom intact, PERRLA Cardio: tachycardic, regular rate & rhythm, no m/r/g  MSK: strength 5/5 UE and LE, sensation intact  Neuro: normal affect, a&o, CN II-XII intact  Skin: c/d/i   MOCA Score: 26   Vitals:   02/01/19 1522  BP: 111/79  Pulse: (!) 107  Temp: 98.2 F (36.8 C)  TempSrc: Oral  SpO2: 98%  Weight: 150 lb 4.8 oz (68.2 kg)  Height: 5' 5.5" (1.664 m)     Assessment & Plan:   See Encounters Tab for problem based charting.  Patient discussed with Dr. [name]

## 2019-02-01 NOTE — Assessment & Plan Note (Signed)
Heart rate today 107 with history of SVTs. She states she was very anxious when she first came in as she had to come in a different way and was worried she was going to be late. She is on propranolol and diltiazem prn and had a telehealth visit with cardiology on 7/09 where diltiazem 180 mg qd was added to her medications. She has not yet picked this up but will do so today. Rechecked pulse, which was regular rate and rhythm. She has had no symptoms of racing heart, SOB or palpitations.   - start dilt 180 mg qd - f/u with cardiology in 3 months

## 2019-02-01 NOTE — Assessment & Plan Note (Signed)
She presents today as she noticed that her right leg was larger than her left leg. She states this may have been present all her life and maybe she only just noticed it. She denies swelling, erythema, or leg pain. On exam her right leg maybe one cm larger than the left. PE otherwise benign. Discussed that this likely has been present all her life and slight assymetry is normal in the human body.

## 2019-02-01 NOTE — Assessment & Plan Note (Signed)
She is concerned that she may have memory loss issues. The other day she went with her grandchildren to a fast food drive through. She paid for the food, but then almost drove off without picking up the food except her grandchildren reminding her as she drove out of the parking lot. She states she remembers giving money to someone then her next memory is when her grandchildren spoke up. She was still driving when this occurred and experience no weakness or LOC.  A similar experience previously occurred in February as well while driving. In both cases she was able to drive but temporarily seemed to stop paying attention. She otherwise has had no symptoms of forgetfulness but is anxious as a relative was just diagnosed with Alzheimer's. On previous visit mini-cog was done, which she scored 5/5 on. On [place] today she scored a 26. On PE there is no numbness or weakness and CN are intact.  She states she has had some increased stress lately and in general has some anxiety. Overall her symptoms appear stress related although are also likely normal for age.   - Discussed that her episode is likely stress related and could possibly be age related as well but is unlikely to be dementia. Discussed symptoms of Alzheimers.  - recommended crossword puzzles and discussed strategies for memory strengthening.  - f/u if symptoms worsen or fail to improve, otherwise f/u with PCP.

## 2019-02-01 NOTE — Patient Instructions (Signed)
Thank you for allowing [place] to provide your care today. Today we discussed your concerns about memory loss and your leg size difference.     Today we made the following changes to your medications:   Please pick up your diltiazem when you are able to.   Please follow-up if symptoms worsen or fail to improve.    Should you have any questions or concerns please call the internal medicine clinic at [phone].

## 2019-02-02 NOTE — Progress Notes (Signed)
Internal Medicine Clinic Attending  Case discussed with Dr. Seawell at the time of the visit.  We reviewed the resident's history and exam and pertinent patient test results.  I agree with the assessment, diagnosis, and plan of care documented in the resident's note.    

## 2019-02-13 ENCOUNTER — Other Ambulatory Visit: Payer: Self-pay | Admitting: Internal Medicine

## 2019-02-13 DIAGNOSIS — J039 Acute tonsillitis, unspecified: Secondary | ICD-10-CM

## 2019-02-14 NOTE — Telephone Encounter (Signed)
Call made to patient after receiving refill request for Augmentin 875-125.  Rx was previously prescribed during virtual visit on 11/12/18 for tonsillitis. No answer-HIPPA compliant message left on recorder.  Will need to access symptoms when pt calls back.Regenia Skeeter, Darlene Cassady7/27/20204:01 PM

## 2019-02-14 NOTE — Telephone Encounter (Signed)
Agree.  No refill until patient has spoken to triage regarding symptoms.  Thank you

## 2019-04-27 ENCOUNTER — Encounter: Payer: Self-pay | Admitting: Gynecology

## 2019-05-03 ENCOUNTER — Ambulatory Visit: Payer: Medicare HMO | Admitting: Cardiology

## 2019-05-04 DIAGNOSIS — H5203 Hypermetropia, bilateral: Secondary | ICD-10-CM | POA: Diagnosis not present

## 2019-06-13 DIAGNOSIS — H2512 Age-related nuclear cataract, left eye: Secondary | ICD-10-CM | POA: Diagnosis not present

## 2019-06-13 DIAGNOSIS — H2513 Age-related nuclear cataract, bilateral: Secondary | ICD-10-CM | POA: Diagnosis not present

## 2019-07-05 DIAGNOSIS — F603 Borderline personality disorder: Secondary | ICD-10-CM | POA: Diagnosis not present

## 2019-07-05 DIAGNOSIS — F332 Major depressive disorder, recurrent severe without psychotic features: Secondary | ICD-10-CM | POA: Diagnosis not present

## 2019-09-02 ENCOUNTER — Other Ambulatory Visit: Payer: Self-pay | Admitting: Internal Medicine

## 2019-09-05 DIAGNOSIS — H2513 Age-related nuclear cataract, bilateral: Secondary | ICD-10-CM | POA: Diagnosis not present

## 2019-09-05 DIAGNOSIS — H2512 Age-related nuclear cataract, left eye: Secondary | ICD-10-CM | POA: Diagnosis not present

## 2019-09-05 DIAGNOSIS — H25013 Cortical age-related cataract, bilateral: Secondary | ICD-10-CM | POA: Diagnosis not present

## 2019-09-05 DIAGNOSIS — H25043 Posterior subcapsular polar age-related cataract, bilateral: Secondary | ICD-10-CM | POA: Diagnosis not present

## 2019-09-27 DIAGNOSIS — F332 Major depressive disorder, recurrent severe without psychotic features: Secondary | ICD-10-CM | POA: Diagnosis not present

## 2019-09-27 DIAGNOSIS — F250 Schizoaffective disorder, bipolar type: Secondary | ICD-10-CM | POA: Diagnosis not present

## 2019-10-06 DIAGNOSIS — H2511 Age-related nuclear cataract, right eye: Secondary | ICD-10-CM | POA: Diagnosis not present

## 2019-10-06 DIAGNOSIS — H25011 Cortical age-related cataract, right eye: Secondary | ICD-10-CM | POA: Diagnosis not present

## 2019-10-06 DIAGNOSIS — H25812 Combined forms of age-related cataract, left eye: Secondary | ICD-10-CM | POA: Diagnosis not present

## 2019-10-06 DIAGNOSIS — H2512 Age-related nuclear cataract, left eye: Secondary | ICD-10-CM | POA: Diagnosis not present

## 2019-10-06 DIAGNOSIS — H25041 Posterior subcapsular polar age-related cataract, right eye: Secondary | ICD-10-CM | POA: Diagnosis not present

## 2019-10-27 DIAGNOSIS — H25812 Combined forms of age-related cataract, left eye: Secondary | ICD-10-CM | POA: Diagnosis not present

## 2019-11-17 DIAGNOSIS — H2511 Age-related nuclear cataract, right eye: Secondary | ICD-10-CM | POA: Diagnosis not present

## 2019-11-17 DIAGNOSIS — H25811 Combined forms of age-related cataract, right eye: Secondary | ICD-10-CM | POA: Diagnosis not present

## 2019-11-21 DIAGNOSIS — R0602 Shortness of breath: Secondary | ICD-10-CM | POA: Diagnosis not present

## 2019-11-21 DIAGNOSIS — I471 Supraventricular tachycardia: Secondary | ICD-10-CM | POA: Diagnosis not present

## 2019-11-21 DIAGNOSIS — R000 Tachycardia, unspecified: Secondary | ICD-10-CM | POA: Diagnosis not present

## 2019-11-21 DIAGNOSIS — I499 Cardiac arrhythmia, unspecified: Secondary | ICD-10-CM | POA: Diagnosis not present

## 2019-12-17 DIAGNOSIS — R61 Generalized hyperhidrosis: Secondary | ICD-10-CM | POA: Diagnosis not present

## 2019-12-17 DIAGNOSIS — I471 Supraventricular tachycardia: Secondary | ICD-10-CM | POA: Diagnosis not present

## 2019-12-17 DIAGNOSIS — I959 Hypotension, unspecified: Secondary | ICD-10-CM | POA: Diagnosis not present

## 2019-12-17 DIAGNOSIS — R000 Tachycardia, unspecified: Secondary | ICD-10-CM | POA: Diagnosis not present

## 2019-12-20 DIAGNOSIS — 419620001 Death: Secondary | SNOMED CT | POA: Diagnosis not present

## 2019-12-20 DEATH — deceased

## 2020-01-14 ENCOUNTER — Other Ambulatory Visit: Payer: Self-pay | Admitting: Cardiology

## 2020-01-17 ENCOUNTER — Telehealth: Payer: Self-pay

## 2020-01-17 NOTE — Telephone Encounter (Signed)
Per Oda Kilts, PA-C, Called patient to inquire about appt scheduled tomorrow at 9:40 am. Per last OV with Dr. [name] patient was undecided about SVT ablation and wanted to wait to schedule. Wanted to inquire if patient was coming in to see Jonni Sanger tomorrow to discuss setting up SVT ablation or if she is coming in to for another reason. If patient wishes to schedule SVT ablation at this time she will need to see Dr. [name].

## 2020-01-17 NOTE — Progress Notes (Deleted)
PCP:  Sid Falcon, MD Primary Cardiologist: No primary care provider on file. Electrophysiologist: [name], MD   Sheri [name] is a 69 y.o. female seen today for [name], MD for {Blank single:19197::"cardiac clearance","post hospital follow up","acute visit due to ***","routine electrophysiology followup"}.  Since {Blank single:19197::"last being seen in our clinic","discharge from hospital"} the patient reports doing ***.  she denies chest pain, palpitations, dyspnea, PND, orthopnea, nausea, vomiting, dizziness, syncope, edema, weight gain, or early satiety.  Past Medical History:  Diagnosis Date  . AF (atrial fibrillation) ([place]) 10/12/2015  . Allergic rhinitis   . Anxiety   . ANXIETY DISORDER, GENERALIZED 08/18/2006   Qualifier: Diagnosis of  By: [name] MD, [name]    . Atrial fibrillation with RVR ([place])   . Cancer ([place])    left lower eyelid, basal cell cancer  . Chiari I malformation ([place]) 03/07/2016  . Chronic sinusitis   . 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 COPD (chronic obstructive pulmonary disease) ([place])   . DEGENERATIVE JOINT DISEASE, HANDS    several images revealing OA  . Depression    treated at [facility]  . GERD (gastroesophageal reflux disease)   . Goiter    hx of goiter  . Hx of atrial tachycardia since age 36 03/15/2013  . Memory loss 12/31/2016  . Mnire's disease 03/07/2016  . Palpitations    Associated with difficulty breathing  . Panic attacks   . Poisoning by selective serotonin reuptake inhibitors(969.03) Jan 2007   severe reaction with MAOI (nardil) and dextromethorphan  . Postmenopausal 12/31/2016  . SVT (supraventricular tachycardia) ([place]) 03/15/2013   History of SVT s/p afib ablation, 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 Following with cardiology.   . Swollen gland 09/14/2018  . Tachycardia   . Tonsillitis 11/12/2018   Past Surgical History:  Procedure Laterality Date  . APPENDECTOMY    .  COLONOSCOPY    . ELECTROPHYSIOLOGIC STUDY N/A 10/12/2015   Procedure: Atrial Fibrillation Ablation;  Surgeon: [name], MD;  Location: [facility];  Service: Cardiovascular;  Laterality: N/A;  . LID LESION EXCISION Left 08/06/2017   Procedure: EXCISION BIOPSY OF EYELID, RECONSTRUCTION OF EYELID;  Surgeon: [name], MD;  Location: [facility];  Service: Ophthalmology;  Laterality: Left;  . OVARIAN CYST REMOVAL  march 1969   left ovarian cystectomy  . right mastoidectomy     ET tube placement in 1980 and 1990  . right sinus surgery  october 2000  . THYROIDECTOMY, PARTIAL     nodule removal - April 1996    Current Outpatient Medications  Medication Sig Dispense Refill  . albuterol (PROVENTIL HFA;VENTOLIN HFA) 108 (90 Base) MCG/ACT inhaler Inhale 1-2 puffs into the lungs every 6 (six) hours as needed for wheezing or shortness of breath. 1 Inhaler 0  . amoxicillin-clavulanate (AUGMENTIN) 875-125 MG tablet Take 1 tablet by mouth 2 (two) times daily. 20 tablet 0  . diltiazem (CARDIZEM CD) 180 MG 24 hr capsule Take 1 capsule (180 mg total) by mouth daily. 90 capsule 3  . diltiazem (CARDIZEM) 30 MG tablet Take 1 tablet (30 mg total) by mouth every 4 (four) hours as needed (for Afib). 30 tablet 1  . meclizine (ANTIVERT) 25 MG tablet TAKE 1 TABLET BY MOUTH THREE TIMES DAILY AS NEEDED FOR DIZZINESS 20 tablet 0  . Multiple Vitamin (MULTIVITAMIN WITH MINERALS) TABS tablet Take 1 tablet by mouth daily.    . pantoprazole (PROTONIX) 20 MG tablet  Take 1 tablet (20 mg total) by mouth daily. 30 tablet 1  . propranolol (INDERAL) 10 MG tablet Take 1 tablet (10 mg total) by mouth 2 (two) times daily. Please keep upcoming appt in June for future refills. Thank you 180 tablet 0  . QUEtiapine (SEROQUEL) 200 MG tablet Take 100 mg by mouth at bedtime.    . traZODone (DESYREL) 100 MG tablet Take 100 mg by mouth at bedtime.    Marland Kitchen venlafaxine (EFFEXOR) 75 MG tablet Take 75 mg by mouth 3 (three) times  daily with meals.      No current facility-administered medications for this visit.    Allergies  Allergen Reactions  . Guaifenesin Palpitations    Social History   Socioeconomic History  . Marital status: Widowed    Spouse name: Not on file  . Number of children: Not on file  . Years of education: Not on file  . Highest education level: Not on file  Occupational History  . 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 Smoking status: Never Smoker  . Smokeless tobacco: Never Used  Vaping Use  . Vaping Use: Never used  Substance and Sexual Activity  . Alcohol use: No    Alcohol/week: 0.0 standard drinks  . Drug use: No  . Sexual activity: Not on file  Other Topics Concern  . Not on file  Social History Narrative   Lives alone.   Social Determinants of Health   Financial Resource Strain:   . Difficulty of Paying Living Expenses:   Food Insecurity:   . Worried About Charity fundraiser in the Last Year:   . Arboriculturist in the Last Year:   Transportation Needs:   . Film/video editor (Medical):   Marland Kitchen Lack of Transportation (Non-Medical):   Physical Activity:   . Days of Exercise per Week:   . Minutes of Exercise per Session:   Stress:   . Feeling of Stress :   Social Connections:   . Frequency of Communication with Friends and Family:   . Frequency of Social Gatherings with Friends and Family:   . Attends Religious Services:   . Active Member of Clubs or Organizations:   . Attends Archivist Meetings:   Marland Kitchen Marital Status:   Intimate Partner Violence:   . Fear of Current or Ex-Partner:   . Emotionally Abused:   Marland Kitchen Physically Abused:   . Sexually Abused:      Review of Systems: General: No chills, fever, night sweats or weight changes  Cardiovascular:  No chest pain, dyspnea on exertion, edema, orthopnea, palpitations, paroxysmal nocturnal dyspnea Dermatological: No rash, lesions or masses Respiratory: No cough,  dyspnea Urologic: No hematuria, dysuria Abdominal: No nausea, vomiting, diarrhea, bright red blood per rectum, melena, or hematemesis Neurologic: No visual changes, weakness, changes in mental status All other systems reviewed and are otherwise negative except as noted above.  Physical Exam: There were no vitals filed for this visit.  GEN- The patient is well appearing, alert and oriented x 3 today.   HEENT: normocephalic, atraumatic; sclera clear, conjunctiva pink; hearing intact; oropharynx clear; neck supple, no JVP Lymph- no cervical lymphadenopathy Lungs- Clear to ausculation bilaterally, normal work of breathing.  No wheezes, rales, rhonchi Heart- Regular rate and rhythm, no murmurs, rubs or gallops, PMI not laterally displaced GI- soft, non-tender, non-distended, bowel sounds present, no hepatosplenomegaly Extremities- no clubbing, cyanosis, or edema; DP/PT/radial pulses 2+  bilaterally MS- no significant deformity or atrophy Skin- warm and dry, no rash or lesion Psych- euthymic mood, full affect Neuro- strength and sensation are intact  EKG {ACTION; IS/IS QXI:[id number]} ordered. Personal review of EKG from {Blank single:19197::"today","***"} shows ***  Additional studies reviewed include: ***  Assessment and Plan:  1. Paroxysmal atrial fibrillation S/p ablation 10/12/2015 Not currently anticoagulated per guidelines with CHA2DS2VASC of 2  (1 point being female)  2. SVT Thought likely AVNRT Has responded to adenosine in the past.  Long term, would like ablation.   Shirley Friar, PA-C  01/17/20 9:52 AM

## 2020-01-18 ENCOUNTER — Other Ambulatory Visit: Payer: Self-pay | Admitting: Internal Medicine

## 2020-01-18 ENCOUNTER — Ambulatory Visit: Payer: Medicare HMO | Admitting: Student

## 2020-01-30 DIAGNOSIS — H18593 Other hereditary corneal dystrophies, bilateral: Secondary | ICD-10-CM | POA: Diagnosis not present

## 2020-01-30 DIAGNOSIS — Z961 Presence of intraocular lens: Secondary | ICD-10-CM | POA: Diagnosis not present

## 2020-02-04 ENCOUNTER — Other Ambulatory Visit: Payer: Self-pay | Admitting: Internal Medicine

## 2020-02-06 NOTE — Telephone Encounter (Signed)
[place] office - please call pt to scheduled an appt . Thanks

## 2020-02-06 NOTE — Telephone Encounter (Signed)
Agree she needs an appointment!  Will send in refill X 1.  No further refills until appointment set.  Thanks!

## 2020-02-06 NOTE — Telephone Encounter (Signed)
Called pt to schedule an appt, last appt was 01/2019 - no answer; left message to call the office for an appt.

## 2020-02-08 ENCOUNTER — Ambulatory Visit: Payer: Medicare HMO | Admitting: Physician Assistant

## 2020-02-08 NOTE — Progress Notes (Deleted)
Cardiology Office Note Date:  02/08/2020  Patient ID:  Sheri, [name] [birth year], MRN [id number] PCP:  Sheri Falcon, MD  Electrophysiologist:  Dr. [name]  ***refresh   Chief Complaint: ***  annual visit  History of Present Illness: Sheri [name] is a 69 y.o. female with history of AFib (s/p ablation 10/12/2015 >> off a/c), SVT (adenosine sensitive). OA  She comes in today to be seen for Dr. [name], last seen by him via tele health July 2020.  At that time she had been having frequent episodes of her SVT using her PRN dilt without success.  She wanted to pursue ablation of her SVT though wanted to wait until family were being allowed with the patients in the hospital.  She was started in diltiazem 180mg  daily and planned to return in 3 mo  *** symptoms SVt vs AF *** meds *** labs, lipids..   Past Medical History:  Diagnosis Date  . AF (atrial fibrillation) ([place]) 10/12/2015  . Allergic rhinitis   . Anxiety   . ANXIETY DISORDER, GENERALIZED 08/18/2006   Qualifier: Diagnosis of  By: [name] MD, [name]    . Atrial fibrillation with RVR ([place])   . Cancer ([place])    left lower eyelid, basal cell cancer  . Chiari I malformation ([place]) 03/07/2016  . Chronic sinusitis   . 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 COPD (chronic obstructive pulmonary disease) ([place])   . DEGENERATIVE JOINT DISEASE, HANDS    several images revealing OA  . Depression    treated at [facility]  . GERD (gastroesophageal reflux disease)   . Goiter    hx of goiter  . Hx of atrial tachycardia since age 29 03/15/2013  . Memory loss 12/31/2016  . Mnire's disease 03/07/2016  . Palpitations    Associated with difficulty breathing  . Panic attacks   . Poisoning by selective serotonin reuptake inhibitors(969.03) Jan 2007   severe reaction with MAOI (nardil) and dextromethorphan  . Postmenopausal 12/31/2016  . 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 Following with cardiology.   . Swollen gland 09/14/2018  . Tachycardia   . Tonsillitis 11/12/2018    Past Surgical History:  Procedure Laterality Date  . APPENDECTOMY    . COLONOSCOPY    . ELECTROPHYSIOLOGIC STUDY N/A 10/12/2015   Procedure: Atrial Fibrillation Ablation;  Surgeon: [name], MD;  Location: [facility];  Service: Cardiovascular;  Laterality: N/A;  . LID LESION EXCISION Left 08/06/2017   Procedure: EXCISION BIOPSY OF EYELID, RECONSTRUCTION OF EYELID;  Surgeon: [name], MD;  Location: [facility];  Service: Ophthalmology;  Laterality: Left;  . OVARIAN CYST REMOVAL  march 1969   left ovarian cystectomy  . right mastoidectomy     ET tube placement in 1980 and 1990  . right sinus surgery  october 2000  . THYROIDECTOMY, PARTIAL     nodule removal - April 1996    Current Outpatient Medications  Medication Sig Dispense Refill  . meclizine (ANTIVERT) 25 MG tablet TAKE 1 TABLET BY MOUTH THREE TIMES DAILY AS NEEDED FOR DIZZINESS 20 tablet 0  . VENTOLIN HFA 108 (90 Base) MCG/ACT inhaler INHALE 1 TO 2 PUFFS BY MOUTH EVERY 6 HOURS AS NEEDED FOR WHEEZING OR SHORTNESS OF BREATH 18 g 2  . amoxicillin-clavulanate (AUGMENTIN) 875-125 MG tablet Take 1 tablet by mouth 2 (two) times daily. 20 tablet 0  .  diltiazem (CARDIZEM CD) 180 MG 24 hr capsule Take 1 capsule (180 mg total) by mouth daily. 90 capsule 3  . diltiazem (CARDIZEM) 30 MG tablet Take 1 tablet (30 mg total) by mouth every 4 (four) hours as needed (for Afib). 30 tablet 1  . Multiple Vitamin (MULTIVITAMIN WITH MINERALS) TABS tablet Take 1 tablet by mouth daily.    . pantoprazole (PROTONIX) 20 MG tablet Take 1 tablet (20 mg total) by mouth daily. 30 tablet 1  . propranolol (INDERAL) 10 MG tablet Take 1 tablet (10 mg total) by mouth 2 (two) times daily. Please keep upcoming appt in June for future refills. Thank you 180 tablet 0  . QUEtiapine (SEROQUEL) 200 MG tablet  Take 100 mg by mouth at bedtime.    . traZODone (DESYREL) 100 MG tablet Take 100 mg by mouth at bedtime.    Marland Kitchen venlafaxine (EFFEXOR) 75 MG tablet Take 75 mg by mouth 3 (three) times daily with meals.      No current facility-administered medications for this visit.    Allergies:   Guaifenesin   Social History:  The patient  reports that she has never smoked. She has never used smokeless tobacco. She reports that she does not drink alcohol and does not use drugs.   Family History:  The patient's family history includes Atrial fibrillation in her sister and sister; Heart attack in her father; Hypertension in her father and mother; Psoriasis in her father.  ROS:  Please see the history of present illness.  All other systems are reviewed and otherwise negative.   PHYSICAL EXAM: *** VS:  There were no vitals taken for this visit. BMI: There is no height or weight on file to calculate BMI. Well nourished, well developed, in no acute distress  HEENT: normocephalic, atraumatic  Neck: no JVD, carotid bruits or masses Cardiac:  *** RRR; no significant murmurs, no rubs, or gallops Lungs:  *** CTA b/l, no wheezing, rhonchi or rales  Abd: soft, nontender MS: no deformity or *** atrophy Ext: *** no edema  Skin: warm and dry, no rash Neuro:  No gross deficits appreciated Psych: euthymic mood, full affectt   EKG:  Done today and reviewed by myself shows ***   10/12/2015: EPS/Ablation CONCLUSIONS: 1. Sinus rhythm upon presentation.   2. Successful electrical isolation and anatomical encircling of all four pulmonary veins with radiofrequency current. 3. No inducible arrhythmias following ablation both on and off of Isuprel 4. No early apparent complications.    03/15/2013: TTE Study Conclusions  - Left ventricle: The cavity size was normal. Wall thickness  was increased in a pattern of mild LVH. Systolic function  was normal. The estimated ejection fraction was in the  range of 55%  to 60%. Wall motion was normal; there were no  regional wall motion abnormalities. Left ventricular  diastolic function parameters were normal.  - Left atrium: The atrium was normal in size.  - Tricuspid valve: Poorly visualized. Trivial regurgitation.  - Pulmonary arteries: PA peak pressure: 37mm Hg (S).  - Inferior vena cava: The vessel was normal in size; the  respirophasic diameter changes were in the normal range (=  50%); findings are consistent with normal central venous  pressure.    Recent Labs: No results found for requested labs within last 8760 hours.  No results found for requested labs within last 8760 hours.   CrCl cannot be calculated (Patient's most recent lab result is older than the maximum 21 days allowed.).   Wt  Readings from Last 3 Encounters:  02/01/19 150 lb 4.8 oz (68.2 kg)  09/15/18 144 lb (65.3 kg)  09/13/18 146 lb 3.2 oz (66.3 kg)     Other studies reviewed: Additional studies/records reviewed today include: summarized above  ASSESSMENT AND PLAN:  1. Paroxysmal AFib     S/p PVI ablation 10/12/2015.     CHA2DS2Vasc is 2 (including gender) and no longer on a/c     ***  2. 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  The patient did not have any concerns regarding medicines.***  Signed, Tommye Standard, PA-C 02/08/2020 5:52 AM     [facility] [address] [place]  [address] [phone] (office)  [phone] (fax)

## 2020-03-13 ENCOUNTER — Encounter: Payer: Self-pay | Admitting: *Deleted

## 2020-03-30 ENCOUNTER — Encounter: Payer: Medicare HMO | Admitting: Internal Medicine

## 2020-04-05 ENCOUNTER — Other Ambulatory Visit: Payer: Self-pay | Admitting: Cardiology

## 2020-04-09 ENCOUNTER — Other Ambulatory Visit: Payer: Self-pay

## 2020-04-09 MED ORDER — PROPRANOLOL HCL 10 MG PO TABS: 10.0000 mg | ORAL_TABLET | Freq: Two times a day (BID) | ORAL | 0 refills | Status: DC

## 2020-04-10 ENCOUNTER — Other Ambulatory Visit: Payer: Self-pay

## 2020-04-10 ENCOUNTER — Ambulatory Visit: Payer: Medicare HMO | Admitting: Physician Assistant

## 2020-04-10 VITALS — BP 110/72 | HR 75 | Ht 65.5 in | Wt 147.0 lb

## 2020-04-10 DIAGNOSIS — I471 Supraventricular tachycardia: Secondary | ICD-10-CM

## 2020-04-10 DIAGNOSIS — I480 Paroxysmal atrial fibrillation: Secondary | ICD-10-CM | POA: Diagnosis not present

## 2020-04-10 NOTE — Progress Notes (Signed)
Cardiology Office Note Date:  04/10/2020  Patient ID:  Sheri [name], Sheri [name] Nov 19, 1950, MRN [id number] PCP:  Sid Falcon, MD  Cardiologist:  Dr. Debara Pickett Electrophysiologist: Dr. [name]     Chief Complaint:  over due, increased palpitations   History of Present Illness: CLANCY LEINER is a 69 y.o. female with history of COPD, OA, Afib, SVT.  She comes in today to be seen for Dr. [name], last seen by him via tele health visit July 2020. SVT is adenosine sensitive and suspect AVNRT. She was having frequent palpitations, used prN dilt without much help if any.  Wanted to hold off ablation until hospital was allowing family, and started in daily diltiazem.  TODAY She has had 2 episodes of SVT since her last visit, she thinks probably actually since about March of this year that she has had to call EMS for her tachycardia, both times they gave her "the medicine in my IV" and it stops immediately and she feels well again.  She does have some short lived/self limited episodes as well She has not noted any trigger for her SVT, it is sudden and racing. She stopped the diltiazem said daily regime did not seem to make a difference for her.  Outside if the tachycardia, she feels well, no CP, SOB or DOE< no difficulties with ADLs, no near syncope or syncope.  She has not noticed any particular trigger or pattern to her palpitations or tachycardia She would like to pursue ablation   AFib Hx Diagnosed 2014 PVI ablation 10/12/2015   Past Medical History:  Diagnosis Date  . AF (atrial fibrillation) ([place]) 10/12/2015  . Allergic rhinitis   . Anxiety   . ANXIETY DISORDER, GENERALIZED 08/18/2006   Qualifier: Diagnosis of  By: [name] MD, [name]    . Atrial fibrillation with RVR ([place])   . Cancer ([place])    left lower eyelid, basal cell cancer  . Chiari I malformation ([place]) 03/07/2016  . Chronic sinusitis   . 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 COPD (chronic obstructive pulmonary  disease) ([place])   . DEGENERATIVE JOINT DISEASE, HANDS    several images revealing OA  . Depression    treated at [facility]  . GERD (gastroesophageal reflux disease)   . Goiter    hx of goiter  . Hx of atrial tachycardia since age 14 03/15/2013  . Memory loss 12/31/2016  . Mnire's disease 03/07/2016  . Palpitations    Associated with difficulty breathing  . Panic attacks   . Poisoning by selective serotonin reuptake inhibitors(969.03) Jan 2007   severe reaction with MAOI (nardil) and dextromethorphan  . Postmenopausal 12/31/2016  . 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 Following with cardiology.   . Swollen gland 09/14/2018  . Tachycardia   . Tonsillitis 11/12/2018    Past Surgical History:  Procedure Laterality Date  . APPENDECTOMY    . COLONOSCOPY    . ELECTROPHYSIOLOGIC STUDY N/A 10/12/2015   Procedure: Atrial Fibrillation Ablation;  Surgeon: [name], MD;  Location: [facility];  Service: Cardiovascular;  Laterality: N/A;  . LID LESION EXCISION Left 08/06/2017   Procedure: EXCISION BIOPSY OF EYELID, RECONSTRUCTION OF EYELID;  Surgeon: [name], MD;  Location: [facility];  Service: Ophthalmology;  Laterality: Left;  . OVARIAN CYST REMOVAL  march 1969   left ovarian cystectomy  . right mastoidectomy     ET  tube placement in 1980 and 1990  . right sinus surgery  october 2000  . THYROIDECTOMY, PARTIAL     nodule removal - April 1996    Current Outpatient Medications  Medication Sig Dispense Refill  . meclizine (ANTIVERT) 25 MG tablet TAKE 1 TABLET BY MOUTH THREE TIMES DAILY AS NEEDED FOR DIZZINESS 20 tablet 0  . Multiple Vitamin (MULTIVITAMIN WITH MINERALS) TABS tablet Take 1 tablet by mouth daily.    . propranolol (INDERAL) 10 MG tablet Take 1 tablet (10 mg total) by mouth 2 (two) times daily. 180 tablet 0  . QUEtiapine (SEROQUEL) 200 MG tablet Take  100 mg by mouth at bedtime.    . traZODone (DESYREL) 100 MG tablet Take 100 mg by mouth at bedtime.    Marland Kitchen venlafaxine (EFFEXOR) 75 MG tablet Take 75 mg by mouth 3 (three) times daily with meals.     . VENTOLIN HFA 108 (90 Base) MCG/ACT inhaler INHALE 1 TO 2 PUFFS BY MOUTH EVERY 6 HOURS AS NEEDED FOR WHEEZING OR SHORTNESS OF BREATH 18 g 2   No current facility-administered medications for this visit.    Allergies:   Guaifenesin   Social History:  The patient  reports that she has never smoked. She has never used smokeless tobacco. She reports that she does not drink alcohol and does not use drugs.   Family History:  The patient's family history includes Atrial fibrillation in her sister and sister; Heart attack in her father; Hypertension in her father and mother; Psoriasis in her father.  ROS:  Please see the history of present illness.    All other systems are reviewed and otherwise negative.   PHYSICAL EXAM:  VS:  BP 110/72   Pulse 75   Ht 5' 5.5" (1.664 m)   Wt 147 lb (66.7 kg)   SpO2 96%   BMI 24.09 kg/m  BMI: Body mass index is 24.09 kg/m. Well nourished, well developed, in no acute distress HEENT: normocephalic, atraumatic Neck: no JVD, carotid bruits or masses Cardiac:  RRR; no significant murmurs, no rubs, or gallops Lungs:  CTA b/l, no wheezing, rhonchi or rales Abd: soft, nontender MS: no deformity or atrophy Ext:  no edema Skin: warm and dry, no rash Neuro:  No gross deficits appreciated Psych: euthymic mood, full affect    EKG:  Done today and reviewed by myself shows  SR 75bpm    10/12/2015: EPS/ablation CONCLUSIONS: 1. Sinus rhythm upon presentation.   2. Successful electrical isolation and anatomical encircling of all four pulmonary veins with radiofrequency current. 3. No inducible arrhythmias following ablation both on and off of Isuprel 4. No early apparent complications.  03/15/2013: TTE Study Conclusions  - Left ventricle: The cavity size was  normal. Wall thickness  was increased in a pattern of mild LVH. Systolic function  was normal. The estimated ejection fraction was in the  range of 55% to 60%. Wall motion was normal; there were no  regional wall motion abnormalities. Left ventricular  diastolic function parameters were normal.  - Left atrium: The atrium was normal in size.  - Tricuspid valve: Poorly visualized. Trivial regurgitation.  - Pulmonary arteries: PA peak pressure: 73mm Hg (S).  - Inferior vena cava: The vessel was normal in size; the  respirophasic diameter changes were in the normal range (=  50%); findings are consistent with normal central venous  pressure.      Recent Labs: No results found for requested labs within last 8760 hours.  No results  found for requested labs within last 8760 hours.   CrCl cannot be calculated (Patient's most recent lab result is older than the maximum 21 days allowed.).   Wt Readings from Last 3 Encounters:  04/10/20 147 lb (66.7 kg)  02/01/19 150 lb 4.8 oz (68.2 kg)  09/15/18 144 lb (65.3 kg)     Other studies reviewed: Additional studies/records reviewed today include: summarized above  ASSESSMENT AND PLAN:  1. Paroxysmal Afib     CHA2DS2Vasc is 2 (age, gender)     S/p ablation, off a/c       2. SVT     She would like to purse ablation     Discussed using an additional PRN propanolol, she has in the past without success     I have revisited EPS ablation procedure, potential risks and benefots, she would like to proceed  I have reached out to Dr. [name], given has been so long since he has seen her, would like her to come in and see him, his RN will call her tomorrow to work her into his schedule ASAP   Disposition: as above  Current medicines are reviewed at length with the patient today.  The patient did not have any concerns regarding medicines.  Venetia Night, PA-C 04/10/2020 11:02 AM     [place] [address] [phone] (office)  [phone] (fax)

## 2020-04-10 NOTE — Patient Instructions (Signed)
Medication Instructions:  Your physician recommends that you continue on your current medications as directed. Please refer to the Current Medication list given to you today.'  *If you need a refill on your cardiac medications before your next appointment, please call your pharmacy*   Lab Work: [place]   If you have labs (blood work) drawn today and your tests are completely normal, you will receive your results only by: Marland Kitchen MyChart Message (if you have MyChart) OR . A paper copy in the mail If you have any lab test that is abnormal or we need to change your treatment, we will call you to review the results.   Testing/Procedures: NONE ORDERED  TODAY     Follow-Up: At [facility], you and your health needs are our priority.  As part of our continuing mission to provide you with exceptional heart care, we have created designated Provider Care Teams.  These Care Teams include your primary Cardiologist (physician) and Advanced Practice Providers (APPs -  Physician Assistants and Nurse Practitioners) who all work together to provide you with the care you need, when you need it.  We recommend signing up for the patient portal called "MyChart".  Sign up information is provided on this After Visit Summary.  MyChart is used to connect with patients for Virtual Visits (Telemedicine).  Patients are able to view lab/test results, encounter notes, upcoming appointments, etc.  Non-urgent messages can be sent to your provider as well.   To learn more about what you can do with MyChart, go to [email or web address].    Your next appointment:  YOU WILL BE CONTACTED [place]

## 2020-04-20 ENCOUNTER — Encounter: Payer: Medicare HMO | Admitting: Internal Medicine

## 2020-04-25 ENCOUNTER — Encounter: Payer: Medicare HMO | Admitting: Internal Medicine

## 2020-05-03 ENCOUNTER — Other Ambulatory Visit: Payer: Self-pay

## 2020-05-03 ENCOUNTER — Encounter: Payer: Self-pay | Admitting: Cardiology

## 2020-05-03 ENCOUNTER — Ambulatory Visit: Payer: Medicare HMO | Admitting: Cardiology

## 2020-05-03 VITALS — BP 104/60 | HR 76 | Ht 65.5 in | Wt 146.0 lb

## 2020-05-03 DIAGNOSIS — Z01812 Encounter for preprocedural laboratory examination: Secondary | ICD-10-CM | POA: Diagnosis not present

## 2020-05-03 DIAGNOSIS — I471 Supraventricular tachycardia: Secondary | ICD-10-CM | POA: Diagnosis not present

## 2020-05-03 NOTE — Progress Notes (Addendum)
Electrophysiology Office Note   Date:  05/03/2020   ID:  Sheri [name], DOB [birth year], MRN [id number]  PCP:  Sid Falcon, [name]  Cardiologist:  Debara Pickett Primary Electrophysiologist:  Maurissa Ambrose [name], [name]    No chief complaint on file.    History of Present Illness: Sheri [name] is a 69 y.o. female who presents today for electrophysiology evaluation.   She has a history of atrial fibrillation.    She was admitted August 2014 with atrial fibrillation and was put on a procainamide drip.  She converted to sinus rhythm.  She had an echo performed which was normal.  She is now status post AF ablation 10/12/2015.    Today, denies symptoms of palpitations, chest pain, shortness of breath, orthopnea, PND, lower extremity edema, claudication, dizziness, presyncope, syncope, bleeding, or neurologic sequela. The patient is tolerating medications without difficulties.  She has been having frequent episodes of SVT.  She has had a few episodes since March, requiring EMS and IV adenosine.  She knows no trigger for her SVT, feeling that it is a sudden and all of a sudden start racing.  She did stop her diltiazem if it did not appear to be making a difference.  At this point, she is tired of further medical therapy and would prefer ablation.    Past Medical History:  Diagnosis Date  . AF (atrial fibrillation) ([name]) 10/12/2015  . Allergic rhinitis   . Anxiety   . ANXIETY DISORDER, GENERALIZED 08/18/2006   Qualifier: Diagnosis of  By: Sheri [name], Sheri [name]    . Atrial fibrillation with RVR ([place])   . Cancer ([place])    left lower eyelid, basal cell cancer  . Chiari I malformation ([place]) 03/07/2016  . Chronic sinusitis   . 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 COPD (chronic obstructive pulmonary disease) ([place])   . DEGENERATIVE JOINT DISEASE, HANDS    several images revealing OA  . Depression    treated at [facility]  . GERD (gastroesophageal reflux disease)   . Goiter    hx of goiter   . Hx of atrial tachycardia since age 61 03/15/2013  . Memory loss 12/31/2016  . Mnire's disease 03/07/2016  . Palpitations    Associated with difficulty breathing  . Panic attacks   . Poisoning by selective serotonin reuptake inhibitors(969.03) Jan 2007   severe reaction with MAOI (nardil) and dextromethorphan  . Postmenopausal 12/31/2016  . 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 Following with cardiology.   . Swollen gland 09/14/2018  . Tachycardia   . Tonsillitis 11/12/2018   Past Surgical History:  Procedure Laterality Date  . APPENDECTOMY    . COLONOSCOPY    . ELECTROPHYSIOLOGIC STUDY N/A 10/12/2015   Procedure: Atrial Fibrillation Ablation;  Surgeon: Sheri [name] [name], [name];  Location: [facility];  Service: Cardiovascular;  Laterality: N/A;  . LID LESION EXCISION Left 08/06/2017   Procedure: EXCISION BIOPSY OF EYELID, RECONSTRUCTION OF EYELID;  Surgeon: [name], [name];  Location: [facility];  Service: Ophthalmology;  Laterality: Left;  . OVARIAN CYST REMOVAL  march 1969   left ovarian cystectomy  . right mastoidectomy     ET tube placement in 1980 and 1990  . right sinus surgery  october 2000  . THYROIDECTOMY, PARTIAL     nodule removal - April 1996     Current Outpatient Medications  Medication Sig Dispense Refill  .  meclizine (ANTIVERT) 25 MG tablet TAKE 1 TABLET BY MOUTH THREE TIMES DAILY AS NEEDED FOR DIZZINESS 20 tablet 0  . Multiple Vitamin (MULTIVITAMIN WITH MINERALS) TABS tablet Take 1 tablet by mouth daily.    . propranolol (INDERAL) 10 MG tablet Take 1 tablet (10 mg total) by mouth 2 (two) times daily. 180 tablet 0  . QUEtiapine (SEROQUEL) 200 MG tablet Take 100 mg by mouth at bedtime.    . traZODone (DESYREL) 100 MG tablet Take 100 mg by mouth at bedtime.    Marland Kitchen venlafaxine (EFFEXOR) 75 MG tablet Take 75 mg by mouth 3 (three) times daily with meals.     .  VENTOLIN HFA 108 (90 Base) MCG/ACT inhaler INHALE 1 TO 2 PUFFS BY MOUTH EVERY 6 HOURS AS NEEDED FOR WHEEZING OR SHORTNESS OF BREATH 18 g 2   No current facility-administered medications for this visit.    Allergies:   Guaifenesin   Social History:  The patient  reports that she has never smoked. She has never used smokeless tobacco. She reports that she does not drink alcohol and does not use drugs.   Family History:  The patient's family history includes Atrial fibrillation in her sister and sister; Heart attack in her father; Hypertension in her father and mother; Psoriasis in her father.   ROS:  Please see the history of present illness.   Otherwise, review of systems is positive for none.   All other systems are reviewed and negative.   PHYSICAL EXAM: VS:  BP 104/60   Pulse 76   Ht 5' 5.5" (1.664 m)   Wt 146 lb (66.2 kg)   SpO2 95%   BMI 23.93 kg/m  , BMI Body mass index is 23.93 kg/m. GEN: Well nourished, well developed, in no acute distress  HEENT: normal  Neck: no JVD, carotid bruits, or masses Cardiac: RRR; no murmurs, rubs, or gallops,no edema  Respiratory:  clear to auscultation bilaterally, normal work of breathing GI: soft, nontender, nondistended, + BS MS: no deformity or atrophy  Skin: warm and dry Neuro:  Strength and sensation are intact Psych: euthymic mood, full affect  EKG:  EKG is ordered today. Personal review of the ekg ordered shows sinus rhythm, rate 76   Recent Labs: No results found for requested labs within last 8760 hours.    Lipid Panel     Component Value Date/Time   CHOL 111 03/16/2013 0355   TRIG 70 03/16/2013 0355   HDL 39 (L) 03/16/2013 0355   CHOLHDL 2.8 03/16/2013 0355   VLDL 14 03/16/2013 0355   LDLCALC 58 03/16/2013 0355     Wt Readings from Last 3 Encounters:  05/03/20 146 lb (66.2 kg)  04/10/20 147 lb (66.7 kg)  02/01/19 150 lb 4.8 oz (68.2 kg)      Other studies Reviewed: Additional studies/ records that were  reviewed today include: TTE 2014  Review of the above records today demonstrates:  - Left ventricle: The cavity size was normal. Wall thickness was increased in a pattern of mild LVH. Systolic function was normal. The estimated ejection fraction was in the range of 55% to 60%. Wall motion was normal; there were no regional wall motion abnormalities. Left ventricular diastolic function parameters were normal. - Left atrium: The atrium was normal in size. - Tricuspid valve: Poorly visualized. Trivial regurgitation. - Pulmonary arteries: PA peak pressure: 53mm Hg (S). - Inferior vena cava: The vessel was normal in size; the respirophasic diameter changes were in the normal range  50%); findings are consistent with normal central venous pressure.   ASSESSMENT AND PLAN:  1.  Paroxysmal atrial fibrillation: Currently on propranolol.  CHA2DS2-VASc 2.  Status post ablation 10/12/2015.  No further episodes.  2.  SVT: Responded to adenosine.  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  At this point, she would like to be off of medical therapy and would prefer ablation.  Risks and benefits have been discussed.  Risk include bleeding, tamponade, heart block, stroke, among others.  The patient understands these risks and is agreed to the procedure.   Current medicines are reviewed at length with the patient today.   The patient does not have concerns regarding her medicines.  The following changes were made today: None  Labs/ tests ordered today include:  Orders Placed This Encounter  Procedures  . Basic metabolic panel  . CBC  . EKG 12-Lead     Disposition:   FU with Diquan Kassis 3 months  Signed, Anneth Brunell [name], [name]  05/03/2020 1:45 PM     [address] [place] [id number] [phone] (office) [phone] (fax)

## 2020-05-03 NOTE — Patient Instructions (Signed)
Medication Instructions:  Your physician recommends that you continue on your current medications as directed. Please refer to the Current Medication list given to you today.  *If you need a refill on your cardiac medications before your next appointment, please call your pharmacy*   Lab Work: Pre procedure labs 07/23/2020 If you have labs (blood work) drawn today and your tests are completely normal, you will receive your results only by: Marland Kitchen MyChart Message (if you have MyChart) OR . A paper copy in the mail If you have any lab test that is abnormal or we need to change your treatment, we will call you to review the results.   Testing/Procedures: Your physician has recommended that you have an ablation. Catheter ablation is a medical procedure used to treat some cardiac arrhythmias (irregular heartbeats). During catheter ablation, a long, thin, flexible tube is put into a blood vessel in your groin (upper thigh), or neck. This tube is called an ablation catheter. It is then guided to your heart through the blood vessel. Radio frequency waves destroy small areas of heart tissue where abnormal heartbeats may cause an arrhythmia to start. Please see the instructions below located under "other instructions".   Follow-Up: At [facility], you and your health needs are our priority.  As part of our continuing mission to provide you with exceptional heart care, we have created designated Provider Care Teams.  These Care Teams include your primary Cardiologist (physician) and Advanced Practice Providers (APPs -  Physician Assistants and Nurse Practitioners) who all work together to provide you with the care you need, when you need it.  We recommend signing up for the patient portal called "MyChart".  Sign up information is provided on this After Visit Summary.  MyChart is used to connect with patients for Virtual Visits (Telemedicine).  Patients are able to view lab/test results, encounter notes,  upcoming appointments, etc.  Non-urgent messages can be sent to your provider as well.   To learn more about what you can do with MyChart, go to [email or web address].    Your next appointment:   4 week(s) after your ablation on 07/25/2020  The format for your next appointment:   In Person  Provider:   Allegra Lai, MD    Thank you for choosing [facility]!!   [name], RN [phone]   Other Instructions    Electrophysiology/Ablation Procedure Instructions   You are scheduled for a(n) SVT ablation on 07/25/2020 with Dr. Allegra Lai.   1.   Pre procedure testing-             A.  LAB WORK --- On 07/23/2020 for your pre procedure blood work - before your get your Covid test.  You do NOT need to be fasting for the blood work.               B. COVID TEST-- On 07/23/2020 @ 2:00 pm (after your lab work) - This is a Financial risk analyst at [address] [place], [place] [address].  Someone will direct you to the appropriate testing line. Stay in your car and someone will be with you shortly.   After you are tested please go home and self quarantine until the day of your procedure.     2. On the day of your procedure 07/25/2020 you will go to [facility] [phone]) at 9:30 am.  Dennis Bast will go to the main entrance [address] [employer]) and enter where the [employer] parking staff  are.  Your driver will drop you off and you will head down the hallway to ADMITTING.  You may have one support person come in to the hospital with you.  They will be asked to wait in the waiting room.   3.   Do not eat or drink after midnight prior to your procedure.   4.   Do not miss any doses of your blood thinner prior to the morning of your procedure or your procedure will need to be rescheduled.       Do NOT take any medications the morning of your procedure.   5.  Plan for an overnight stay, but you may be discharged home after your procedure.    If you use your phone frequently bring your  phone charger, in case you have to stay.  If you are discharged after your procedure you will need someone to drive you home and be with your for 24 hours after your procedure.   6. You will follow up with Dr. [name]  1 month after your procedure.  This appointment will be made for you.   * If you have ANY questions please call the office [phone] and ask for Christinamarie Tall RN or send me a MyChart message   * Occasionally, EP Studies and ablations can become lengthy.  Please make your family aware of this before your procedure starts.  Average time ranges from 2-8 hours for EP studies/ablations.  Your physician will call your family after the procedure with the results.

## 2020-05-03 NOTE — Addendum Note (Signed)
Addended by: Stanton Kidney on: 05/03/2020 12:45 PM   Modules accepted: Orders

## 2020-05-27 ENCOUNTER — Other Ambulatory Visit: Payer: Self-pay | Admitting: Internal Medicine

## 2020-05-28 NOTE — Telephone Encounter (Signed)
Next appt scheduled 12/10 with PCP.

## 2020-06-23 ENCOUNTER — Other Ambulatory Visit: Payer: Self-pay | Admitting: Cardiology

## 2020-06-29 ENCOUNTER — Encounter: Payer: Medicare HMO | Admitting: Internal Medicine

## 2020-07-11 ENCOUNTER — Telehealth: Payer: Self-pay | Admitting: *Deleted

## 2020-07-11 NOTE — Telephone Encounter (Signed)
Pt advised of time change to arrive to hospital on day of SVT ablation 07/25/20.  Aware to arrive to [facility] at 12:30 pm for procedure, not 9:30 am as originally instructed. Patient verbalized understanding and agreeable to plan.

## 2020-07-23 ENCOUNTER — Other Ambulatory Visit (HOSPITAL_COMMUNITY)
Admission: RE | Admit: 2020-07-23 | Discharge: 2020-07-23 | Disposition: A | Discharge status: Home / Self Care | Payer: Medicare Other | Source: Ambulatory Visit | Attending: Cardiology | Admitting: Cardiology

## 2020-07-23 ENCOUNTER — Other Ambulatory Visit: Payer: Self-pay

## 2020-07-23 ENCOUNTER — Other Ambulatory Visit: Payer: Medicare Other

## 2020-07-23 DIAGNOSIS — Z01812 Encounter for preprocedural laboratory examination: Secondary | ICD-10-CM | POA: Insufficient documentation

## 2020-07-23 DIAGNOSIS — Z20822 Contact with and (suspected) exposure to covid-19: Secondary | ICD-10-CM | POA: Diagnosis not present

## 2020-07-23 DIAGNOSIS — I471 Supraventricular tachycardia, unspecified: Secondary | ICD-10-CM

## 2020-07-23 LAB — SARS CORONAVIRUS 2 (TAT 6-24 HRS): SARS Coronavirus 2: NEGATIVE

## 2020-07-24 LAB — CBC
Hematocrit: 40.8 % (ref 34.0–46.6)
Hemoglobin: 13.3 g/dL (ref 11.1–15.9)
MCH: 29.3 pg (ref 26.6–33.0)
MCHC: 32.6 g/dL (ref 31.5–35.7)
MCV: 90 fL (ref 79–97)
Platelets: 370 10*3/uL (ref 150–450)
RBC: 4.54 x10E6/uL (ref 3.77–5.28)
RDW: 11.8 % (ref 11.7–15.4)
WBC: 9.4 10*3/uL (ref 3.4–10.8)

## 2020-07-24 LAB — BASIC METABOLIC PANEL
BUN/Creatinine Ratio: 17 (ref 12–28)
BUN: 13 mg/dL (ref 8–27)
CO2: 25 mmol/L (ref 20–29)
Calcium: 9.5 mg/dL (ref 8.7–10.3)
Chloride: 107 mmol/L — ABNORMAL HIGH (ref 96–106)
Creatinine, Ser: 0.77 mg/dL (ref 0.57–1.00)
GFR calc Af Amer: 91 mL/min/{1.73_m2} (ref >59)
GFR calc non Af Amer: 79 mL/min/{1.73_m2} (ref >59)
Glucose: 87 mg/dL (ref 65–99)
Potassium: 4.8 mmol/L (ref 3.5–5.2)
Sodium: 144 mmol/L (ref 134–144)

## 2020-07-24 NOTE — Progress Notes (Signed)
Instructed patient on the following items: Arrival time 1230 Nothing to eat or drink after midnight No meds AM of procedure Responsible person to drive you home and stay with you for 24 hrs     

## 2020-07-25 ENCOUNTER — Ambulatory Visit (HOSPITAL_COMMUNITY)
Admission: RE | Disposition: A | Discharge status: Home / Self Care | Payer: Medicare Other | Source: Home / Self Care | Attending: Cardiology

## 2020-07-25 ENCOUNTER — Ambulatory Visit (HOSPITAL_COMMUNITY): Payer: Medicare Other | Admitting: Certified Registered"

## 2020-07-25 ENCOUNTER — Ambulatory Visit (HOSPITAL_COMMUNITY)
Admission: RE | Admit: 2020-07-25 | Discharge: 2020-07-25 | Disposition: A | Discharge status: Home / Self Care | Payer: Medicare Other | Attending: Cardiology | Admitting: Cardiology

## 2020-07-25 DIAGNOSIS — I480 Paroxysmal atrial fibrillation: Secondary | ICD-10-CM | POA: Insufficient documentation

## 2020-07-25 DIAGNOSIS — Z8249 Family history of ischemic heart disease and other diseases of the circulatory system: Secondary | ICD-10-CM | POA: Insufficient documentation

## 2020-07-25 DIAGNOSIS — I471 Supraventricular tachycardia: Secondary | ICD-10-CM | POA: Diagnosis not present

## 2020-07-25 HISTORY — PX: SVT ABLATION: EP1225

## 2020-07-25 SURGERY — SVT ABLATION
Anesthesia: Monitor Anesthesia Care

## 2020-07-25 MED ORDER — HEPARIN (PORCINE) IN NACL 1000-0.9 UT/500ML-% IV SOLN
INTRAVENOUS | Status: AC
  Filled 2020-07-25: qty 500

## 2020-07-25 MED ORDER — METOPROLOL SUCCINATE ER 25 MG PO TB24
ORAL_TABLET | ORAL | Status: AC
  Administered 2020-07-25: 50 mg
  Filled 2020-07-25: qty 2

## 2020-07-25 MED ORDER — BUPIVACAINE HCL (PF) 0.25 % IJ SOLN
INTRAMUSCULAR | Status: DC | PRN
  Administered 2020-07-25: 40 mL

## 2020-07-25 MED ORDER — BUPIVACAINE HCL (PF) 0.25 % IJ SOLN
INTRAMUSCULAR | Status: AC
  Filled 2020-07-25: qty 60

## 2020-07-25 MED ORDER — METOPROLOL TARTRATE 5 MG/5ML IV SOLN
INTRAVENOUS | Status: AC
  Filled 2020-07-25: qty 5

## 2020-07-25 MED ORDER — METOPROLOL TARTRATE 5 MG/5ML IV SOLN
5.0000 mg | INTRAVENOUS | Status: DC | PRN
  Administered 2020-07-25: 5 mg via INTRAVENOUS
  Filled 2020-07-25: qty 5

## 2020-07-25 MED ORDER — METOPROLOL SUCCINATE ER 50 MG PO TB24
50.0000 mg | ORAL_TABLET | Freq: Every day | ORAL | Status: DC
  Administered 2020-07-25: 50 mg via ORAL

## 2020-07-25 MED ORDER — SODIUM CHLORIDE 0.9 % IV SOLN: INTRAVENOUS | Status: DC

## 2020-07-25 MED ORDER — ISOPROTERENOL HCL 0.2 MG/ML IJ SOLN
INTRAVENOUS | Status: DC | PRN
  Administered 2020-07-25: 2 ug/min via INTRAVENOUS

## 2020-07-25 MED ORDER — METOPROLOL SUCCINATE ER 50 MG PO TB24: 50.0000 mg | ORAL_TABLET | Freq: Every day | ORAL | 11 refills | Status: DC

## 2020-07-25 MED ORDER — ISOPROTERENOL HCL 0.2 MG/ML IJ SOLN
INTRAMUSCULAR | Status: AC
  Filled 2020-07-25: qty 5

## 2020-07-25 MED ORDER — PROPOFOL 500 MG/50ML IV EMUL
INTRAVENOUS | Status: DC | PRN
  Administered 2020-07-25: 100 ug/kg/min via INTRAVENOUS

## 2020-07-25 MED ORDER — PROPOFOL 10 MG/ML IV BOLUS
INTRAVENOUS | Status: DC | PRN
  Administered 2020-07-25: 15 mg via INTRAVENOUS
  Administered 2020-07-25 (×2): 20 mg via INTRAVENOUS
  Administered 2020-07-25: 15 mg via INTRAVENOUS

## 2020-07-25 MED ORDER — LIDOCAINE 2% (20 MG/ML) 5 ML SYRINGE
INTRAMUSCULAR | Status: DC | PRN
  Administered 2020-07-25: 20 mg via INTRAVENOUS

## 2020-07-25 MED ORDER — MIDAZOLAM HCL 2 MG/2ML IJ SOLN
INTRAMUSCULAR | Status: DC | PRN
  Administered 2020-07-25: 1 mg via INTRAVENOUS

## 2020-07-25 MED ORDER — HEPARIN (PORCINE) IN NACL 1000-0.9 UT/500ML-% IV SOLN
INTRAVENOUS | Status: DC | PRN
  Administered 2020-07-25: 500 mL

## 2020-07-25 SURGICAL SUPPLY — 12 items
BAG SNAP BAND KOVER 36X36 (MISCELLANEOUS) ×1 IMPLANT
CATH EZ STEER NAV 4MM D-F CUR (ABLATOR) ×2 IMPLANT
CATH JOSEPH QUAD ALLRED 6F REP (CATHETERS) ×2 IMPLANT
CATH WEB BI DIR CSDF CRV REPRO (CATHETERS) ×1 IMPLANT
CLOSURE PERCLOSE PROSTYLE (VASCULAR PRODUCTS) ×4 IMPLANT
PACK EP LATEX FREE (CUSTOM PROCEDURE TRAY) ×2
PACK EP LF (CUSTOM PROCEDURE TRAY) ×1 IMPLANT
PAD PRO RADIOLUCENT 2001M-C (PAD) ×2 IMPLANT
PATCH CARTO3 (PAD) ×1 IMPLANT
SHEATH PINNACLE 6F 10CM (SHEATH) ×2 IMPLANT
SHEATH PINNACLE 7F 10CM (SHEATH) ×1 IMPLANT
SHEATH PINNACLE 8F 10CM (SHEATH) ×1 IMPLANT

## 2020-07-25 NOTE — Discharge Instructions (Signed)
Post procedure care instructions No driving for 4 days. No lifting over 5 lbs for 1 week. No vigorous or sexual activity for 1 week. You may return to work/your usual activities on 08/01/20. Keep procedure site clean & dry. If you notice increased pain, swelling, bleeding or pus, call/return!  You may shower after 24 hours, but no soaking in baths/hot tubs/pools for 1 week.  Metoprolol Extended-Release Capsules What is this medicine? METOPROLOL (me TOE proe lole) is a beta blocker. It decreases the amount of work your heart has to do and helps your heart beat regularly. It treats high blood pressure and/or prevents chest pain (also called angina). It also treats heart failure. This medicine may be used for other purposes; ask your health care provider or pharmacist if you have questions. COMMON BRAND NAME(S): KAPSPARGO What should I tell my health care provider before I take this medicine? They need to know if you have any of these conditions:  diabetes  heart disease  liver disease  lung or breathing disease, like asthma  pheochromocytoma  thyroid disease  an unusual or allergic reaction to metoprolol, other beta-blockers, medicines, foods, dyes, or preservatives  pregnant or trying to get pregnant  breast-feeding How should I use this medicine? Take this drug by mouth with water. Take it as directed on the prescription label at the same time every day. Do not cut, crush or chew this drug. Swallow the capsules whole. You may open the capsule and put the contents in 1 teaspoon of applesauce. Swallow the drug and applesauce right away. Do not chew the drug or applesauce. Keep taking it unless your health care provider tells you to stop. Talk to your health care provider about the use of this drug in children. While it may be prescribed for children as young as 6 for selected conditions, precautions do apply. Overdosage: If you think you have taken too much of this medicine contact a  poison control center or emergency room at once. NOTE: This medicine is only for you. Do not share this medicine with others. What if I miss a dose? If you miss a dose, take it as soon as you can. If it is almost time for your next dose, take only that dose. Do not take double or extra doses. What may interact with this medicine? This medicine may interact with the following medications:  certain medicines for blood pressure, heart disease, irregular heart beat  epinephrine  fluoxetine  MAOIs like Carbex, Eldepryl, Marplan, Nardil, and Parnate  paroxetine  reserpine This list may not describe all possible interactions. Give your health care provider a list of all the medicines, herbs, non-prescription drugs, or dietary supplements you use. Also tell them if you smoke, drink alcohol, or use illegal drugs. Some items may interact with your medicine. What should I watch for while using this medicine? You may get drowsy or dizzy. Do not drive, use machinery, or do anything that needs mental alertness until you know how this medicine affects you. Do not stand or sit up quickly, especially if you are an older patient. This reduces the risk of dizzy or fainting spells. Alcohol may interfere with the effect of this medicine. Avoid alcoholic drinks. Visit your doctor or health care professional for regular checks on your progress. Check your blood pressure as directed. Ask your doctor or health care professional what your blood pressure should be and when you should contact him or her. Do not treat yourself for coughs, colds, or pain  while you are using this medicine without asking your doctor or health care professional for advice. Some ingredients may increase your blood pressure. This medicine may increase blood sugar. Ask your healthcare provider if changes in diet or medicines are needed if you have diabetes. What side effects may I notice from receiving this medicine? Side effects that you  should report to your doctor or health care professional as soon as possible:  allergic reactions like skin rash, itching or hives, swelling of the face, lips, or tongue  cold hands or feet  signs and symptoms of high blood sugar such as being more thirsty or hungry or having to urinate more than normal. You may also feel very tired or have blurry vision.  signs and symptoms of low blood pressure like dizziness; feeling faint or lightheaded, falls; unusually weak or tired  signs of worsening heart failure like breathing problems, swelling in your legs and feet  suicidal thoughts or other mood changes  unusually slow heartbeat Side effects that usually do not require medical attention (report these to your doctor or health care professional if they continue or are bothersome):  anxious  change in sex drive or performance  diarrhea  headache  trouble sleeping  upset stomach This list may not describe all possible side effects. Call your doctor for medical advice about side effects. You may report side effects to FDA at 1-800-FDA-1088. Where should I keep my medicine? Keep out of the reach of children and pets. Store at room temperature between 20 and 25 degrees C (68 and 77 degrees F). Throw away any unused drug after the expiration date. NOTE: This sheet is a summary. It may not cover all possible information. If you have questions about this medicine, talk to your doctor, pharmacist, or health care provider.  2020 Elsevier/Gold Standard (2019-04-06 13:24:03)  Femoral Site Care This sheet gives you information about how to care for yourself after your procedure. Your health care provider may also give you more specific instructions. If you have problems or questions, contact your health care provider. What can I expect after the procedure? After the procedure, it is common to have:  Bruising that usually fades within 1-2 weeks.  Tenderness at the site. Follow these  instructions at home: Wound care  Follow instructions from your health care provider about how to take care of your insertion site. Make sure you: ? Wash your hands with soap and water before you change your bandage (dressing). If soap and water are not available, use hand sanitizer. ? Change your dressing as told by your health care provider. ? Leave stitches (sutures), skin glue, or adhesive strips in place. These skin closures may need to stay in place for 2 weeks or longer. If adhesive strip edges start to loosen and curl up, you may trim the loose edges. Do not remove adhesive strips completely unless your health care provider tells you to do that.  Do not take baths, swim, or use a hot tub until your health care provider approves.  You may shower 24-48 hours after the procedure or as told by your health care provider. ? Gently wash the site with plain soap and water. ? Pat the area dry with a clean towel. ? Do not rub the site. This may cause bleeding.  Do not apply powder or lotion to the site. Keep the site clean and dry.  Check your femoral site every day for signs of infection. Check for: ? Redness, swelling, or pain. ?  Fluid or blood. ? Warmth. ? Pus or a bad smell. Activity  For the first 2-3 days after your procedure, or as long as directed: ? Avoid climbing stairs as much as possible. ? Do not squat.  Do not lift anything that is heavier than 10 lb (4.5 kg), or the limit that you are told, until your health care provider says that it is safe.  Rest as directed. ? Avoid sitting for a long time without moving. Get up to take short walks every 1-2 hours.  Do not drive for 24 hours if you were given a medicine to help you relax (sedative). General instructions  Take over-the-counter and prescription medicines only as told by your health care provider.  Keep all follow-up visits as told by your health care provider. This is important. Contact a health care provider if  you have:  A fever or chills.  You have redness, swelling, or pain around your insertion site. Get help right away if:  The catheter insertion area swells very fast.  You pass out.  You suddenly start to sweat or your skin gets clammy.  The catheter insertion area is bleeding, and the bleeding does not stop when you hold steady pressure on the area.  The area near or just beyond the catheter insertion site becomes pale, cool, tingly, or numb. These symptoms may represent a serious problem that is an emergency. Do not wait to see if the symptoms will go away. Get medical help right away. Call your local emergency services (911 in the [place] Do not drive yourself to the hospital. Summary  After the procedure, it is common to have bruising that usually fades within 1-2 weeks.  Check your femoral site every day for signs of infection.  Do not lift anything that is heavier than 10 lb (4.5 kg), or the limit that you are told, until your health care provider says that it is safe. This information is not intended to replace advice given to you by your health care provider. Make sure you discuss any questions you have with your health care provider. Document Revised: 07/20/2017 Document Reviewed: 07/20/2017 Elsevier Patient Education  2020 [employer].

## 2020-07-25 NOTE — Transfer of Care (Signed)
Immediate Anesthesia Transfer of Care Note  Patient: Sheri [name]  Procedure(s) Performed: SVT ABLATION (N/A )  Patient Location: PACU and Cath Lab  Anesthesia Type:MAC  Level of Consciousness: awake, alert  and patient cooperative  Airway & Oxygen Therapy: Patient Spontanous Breathing  Post-op Assessment: Report given to RN and Post -op Vital signs reviewed and stable  Post vital signs: Reviewed and stable  Last Vitals:  Vitals Value Taken Time  BP 102/78 07/25/20 1457  Temp    Pulse 82 07/25/20 1458  Resp 19 07/25/20 1458  SpO2 98 % 07/25/20 1458  Vitals shown include unvalidated device data.  Last Pain:  Vitals:   07/25/20 1249  TempSrc: Oral         Complications: No complications documented.

## 2020-07-25 NOTE — H&P (Signed)
Electrophysiology Office Note   Date:  07/25/2020   ID:  Sheri [name], DOB 1951-01-24, MRN [id number]  PCP:  Sheri Catalina, MD  Cardiologist:  Sheri [name] Primary Electrophysiologist:  Sheri [name] Sheri Loa, MD    No chief complaint on file.    History of Present Illness: Sheri [name] is a 70 y.o. female who presents today for electrophysiology evaluation.   She has a history of atrial fibrillation.    She was admitted August 2014 with atrial fibrillation and was put on a procainamide drip.  She converted to sinus rhythm.  She had an echo performed which was normal.  She is now status post AF ablation 10/12/2015.    Today, denies symptoms of palpitations, chest pain, shortness of breath, orthopnea, PND, lower extremity edema, claudication, dizziness, presyncope, syncope, bleeding, or neurologic sequela. The patient is tolerating medications without difficulties. Plan for SVT ablation today.     Past Medical History:  Diagnosis Date  . AF (atrial fibrillation) (HCC) 10/12/2015  . Allergic rhinitis   . Anxiety   . ANXIETY DISORDER, GENERALIZED 08/18/2006   Qualifier: Diagnosis of  By: Sheri Handing MD, Sheri [name]    . Atrial fibrillation with RVR (HCC)   . Cancer (HCC)    left lower eyelid, basal cell cancer  . Chiari I malformation (HCC) 03/07/2016  . Chronic sinusitis   . 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 COPD (chronic obstructive pulmonary disease) (HCC)   . DEGENERATIVE JOINT DISEASE, HANDS    several images revealing OA  . Depression    treated at [facility]  . GERD (gastroesophageal reflux disease)   . Goiter    hx of goiter  . Hx of atrial tachycardia since age 28 03/15/2013  . Memory loss 12/31/2016  . Mnire's disease 03/07/2016  . Palpitations    Associated with difficulty breathing  . Panic attacks   . Poisoning by selective serotonin reuptake inhibitors(969.03) Jan 2007   severe reaction with MAOI (nardil) and dextromethorphan  . Postmenopausal  12/31/2016  . 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 Following with cardiology.   . Swollen gland 09/14/2018  . Tachycardia   . Tonsillitis 11/12/2018   Past Surgical History:  Procedure Laterality Date  . APPENDECTOMY    . COLONOSCOPY    . ELECTROPHYSIOLOGIC STUDY N/A 10/12/2015   Procedure: Atrial Fibrillation Ablation;  Surgeon: Sheri Spark Sheri Loa, MD;  Location: [facility] INVASIVE [facility];  Service: Cardiovascular;  Laterality: N/A;  . LID LESION EXCISION Left 08/06/2017   Procedure: EXCISION BIOPSY OF EYELID, RECONSTRUCTION OF EYELID;  Surgeon: Sheri Flock, MD;  Location: [facility] OR;  Service: Ophthalmology;  Laterality: Left;  . OVARIAN CYST REMOVAL  march 1969   left ovarian cystectomy  . right mastoidectomy     ET tube placement in 1980 and 1990  . right sinus surgery  october 2000  . THYROIDECTOMY, PARTIAL     nodule removal - April 1996     Current Facility-Administered Medications  Medication Dose Route Frequency Provider Last Rate Last Admin  . 0.9 %  sodium chloride infusion   Intravenous Continuous Sheri [name], Sheri Coss, MD        Allergies:   Guaifenesin   Social History:  The patient  reports that she has never smoked. She has never used smokeless tobacco. She reports that she does not drink alcohol and does not use drugs.   Family History:  The patient's family history includes Atrial fibrillation in her sister and sister; Heart attack in her father; Hypertension in her father and mother; Psoriasis in her father.   ROS:  Please see the history of present illness.   Otherwise, review of systems is positive for none.   All other systems are reviewed and negative.   PHYSICAL EXAM: VS:  BP 115/80   Pulse 85   Temp 97.8 F (36.6 C) (Oral)   Resp 16   Ht 5' 5.5" (1.664 m)   Wt 67.1 kg   SpO2 98%   BMI 24.25 kg/m  , BMI Body mass index is 24.25 kg/m. GEN: Well nourished,  well developed, in no acute distress  HEENT: normal  Neck: no JVD, carotid bruits, or masses Cardiac: RRR; no murmurs, rubs, or gallops,no edema  Respiratory:  clear to auscultation bilaterally, normal work of breathing GI: soft, nontender, nondistended, + BS MS: no deformity or atrophy  Skin: warm and dry Neuro:  Strength and sensation are intact Psych: euthymic mood, full affect  Recent Labs: 07/23/2020: BUN 13; Creatinine, Ser 0.77; Hemoglobin 13.3; Platelets 370; Potassium 4.8; Sodium 144    Lipid Panel     Component Value Date/Time   CHOL 111 03/16/2013 0355   TRIG 70 03/16/2013 0355   HDL 39 (L) 03/16/2013 0355   CHOLHDL 2.8 03/16/2013 0355   VLDL 14 03/16/2013 0355   LDLCALC 58 03/16/2013 0355     Wt Readings from Last 3 Encounters:  07/25/20 67.1 kg  05/03/20 66.2 kg  04/10/20 66.7 kg      Other studies Reviewed: Additional studies/ records that were reviewed today include: TTE 2014  Review of the above records today demonstrates:  - Left ventricle: The cavity size was normal. Wall thickness was increased in a pattern of mild LVH. Systolic function was normal. The estimated ejection fraction was in the range of 55% to 60%. Wall motion was normal; there were no regional wall motion abnormalities. Left ventricular diastolic function parameters were normal. - Left atrium: The atrium was normal in size. - Tricuspid valve: Poorly visualized. Trivial regurgitation. - Pulmonary arteries: PA peak pressure: 57mm Hg (S). - Inferior vena cava: The vessel was normal in size; the respirophasic diameter changes were in the normal range  50%); findings are consistent with normal central venous pressure.   ASSESSMENT AND PLAN:  1.  Paroxysmal atrial fibrillation: Currently on propranolol.  CHA2DS2-VASc 2.  Status post ablation 10/12/2015.  No further episodes.  2.  SVT: Sheri [name] has presented today for surgery, with the diagnosis of SVT.  The various  methods of treatment have been discussed with the patient and family. After consideration of risks, benefits and other options for treatment, the patient has consented to  Procedure(s): Catheter ablation as a surgical intervention .  Risks include but not limited to complete heart block, stroke, esophageal damage, nerve damage, bleeding, vascular damage, tamponade, perforation, MI, and death. The patient's history has been reviewed, patient examined, no change in status, stable for surgery.  I have reviewed the patient's chart and labs.  Questions were answered to the patient's satisfaction.    Justice Milliron Elberta Fortis, MD 07/25/2020 12:56 PM

## 2020-07-25 NOTE — Progress Notes (Addendum)
Called  Regarding SVT  Patient is lying in bed, skin is warm and dry, she is aware of her SVT and  Is upset/anxious, though otherwise denies symptoms. BP 95/60, SVT 170s EKG is SVT 171bpm, short RP tachycardia IV lopressor  given  Subsequent conversion to SR 90's, BP 124/70  Discussed with the patient that Dr. Elberta Fortis would be by to see her once out of his current procedure and discuss strategies for her SVT. May consider adjusting her home propanolol  Noting she was not inducible at the time of her EPS today.  The patient wants to go home today regardless, and assured her that she could still go home, once her bedrest was completed.  Francis Dowse, PA-C

## 2020-07-25 NOTE — Anesthesia Preprocedure Evaluation (Addendum)
Anesthesia Evaluation  Patient identified by MRN, date of birth, ID band Patient awake    Reviewed: Allergy & Precautions, NPO status , Patient's Chart, lab work & pertinent test results  Airway Mallampati: II       Dental  (+) Poor Dentition, Missing   Pulmonary COPD,    Pulmonary exam normal breath sounds clear to auscultation       Cardiovascular Exercise Tolerance: Good + dysrhythmias Atrial Fibrillation and Supra Ventricular Tachycardia  Rhythm:Regular Rate:Normal     Neuro/Psych PSYCHIATRIC DISORDERS Anxiety Depression    GI/Hepatic Neg liver ROS, GERD  ,  Endo/Other  negative endocrine ROS  Renal/GU negative Renal ROS     Musculoskeletal  (+) Arthritis ,   Abdominal Normal abdominal exam  (+)   Peds negative pediatric ROS (+)  Hematology negative hematology ROS (+)   Anesthesia Other Findings   Reproductive/Obstetrics negative OB ROS                            Anesthesia Physical Anesthesia Plan  ASA: III  Anesthesia Plan: MAC   Post-op Pain Management:    Induction: Intravenous  PONV Risk Score and Plan: 2 and Propofol infusion and TIVA  Airway Management Planned: Natural Airway and Nasal Cannula  Additional Equipment:   Intra-op Plan:   Post-operative Plan:   Informed Consent: I have reviewed the patients History and Physical, chart, labs and discussed the procedure including the risks, benefits and alternatives for the proposed anesthesia with the patient or authorized representative who has indicated his/her understanding and acceptance.     Dental advisory given  Plan Discussed with: CRNA and Anesthesiologist  Anesthesia Plan Comments:         Anesthesia Quick Evaluation

## 2020-07-25 NOTE — Anesthesia Procedure Notes (Signed)
Procedure Name: MAC Date/Time: 07/25/2020 1:31 PM Performed by: Janace Litten, CRNA Pre-anesthesia Checklist: Patient identified, Emergency Drugs available, Suction available and Patient being monitored Patient Re-evaluated:Patient Re-evaluated prior to induction Oxygen Delivery Method: Nasal cannula

## 2020-07-25 NOTE — Anesthesia Postprocedure Evaluation (Signed)
Anesthesia Post Note  Patient: Sheri [name]  Procedure(s) Performed: SVT ABLATION (N/A )     Patient location during evaluation: PACU Anesthesia Type: MAC Level of consciousness: awake and alert Pain management: pain level controlled Vital Signs Assessment: post-procedure vital signs reviewed and stable Respiratory status: spontaneous breathing, nonlabored ventilation, respiratory function stable and patient connected to nasal cannula oxygen Cardiovascular status: stable and blood pressure returned to baseline Postop Assessment: no apparent nausea or vomiting Anesthetic complications: no   No complications documented.  Last Vitals:  Vitals:   07/25/20 1530 07/25/20 1532  BP: 119/72 123/68  Pulse: 79 76  Resp: 17 20  Temp: (!) 36.3 C   SpO2: 99% 100%    Last Pain:  Vitals:   07/25/20 1249  TempSrc: Oral                 Tiajuana Amass

## 2020-07-25 NOTE — Progress Notes (Signed)
1615- patient in SVT upon arriving from cath lab. Patient HR 170s, C/o pain to left shoulder and arm. Dr. Elberta Fortis ordered metoprolol 5 mg IV.  EKG was also done to confirm SVT and shown to [facility] .   1640- Hr now in the 80s and patient is feeling better , denies any pain/

## 2020-07-26 ENCOUNTER — Encounter (HOSPITAL_COMMUNITY): Payer: Self-pay | Admitting: Cardiology

## 2020-07-26 MED FILL — Heparin Sod (Porcine)-NaCl IV Soln 1000 Unit/500ML-0.9%: INTRAVENOUS | Qty: 500 | Status: AC

## 2020-08-10 ENCOUNTER — Other Ambulatory Visit: Payer: Self-pay

## 2020-08-10 ENCOUNTER — Ambulatory Visit (INDEPENDENT_AMBULATORY_CARE_PROVIDER_SITE_OTHER): Payer: Medicare (Managed Care) | Admitting: Internal Medicine

## 2020-08-10 ENCOUNTER — Encounter: Payer: Self-pay | Admitting: Internal Medicine

## 2020-08-10 VITALS — BP 104/64 | HR 77 | Temp 98.0°F | Ht 65.5 in | Wt 151.0 lb

## 2020-08-10 DIAGNOSIS — N6452 Nipple discharge: Secondary | ICD-10-CM | POA: Diagnosis not present

## 2020-08-10 DIAGNOSIS — R197 Diarrhea, unspecified: Secondary | ICD-10-CM | POA: Insufficient documentation

## 2020-08-10 DIAGNOSIS — F3341 Major depressive disorder, recurrent, in partial remission: Secondary | ICD-10-CM

## 2020-08-10 DIAGNOSIS — K047 Periapical abscess without sinus: Secondary | ICD-10-CM

## 2020-08-10 DIAGNOSIS — K648 Other hemorrhoids: Secondary | ICD-10-CM | POA: Diagnosis not present

## 2020-08-10 DIAGNOSIS — Z1231 Encounter for screening mammogram for malignant neoplasm of breast: Secondary | ICD-10-CM

## 2020-08-10 DIAGNOSIS — Z1211 Encounter for screening for malignant neoplasm of colon: Secondary | ICD-10-CM

## 2020-08-10 DIAGNOSIS — I480 Paroxysmal atrial fibrillation: Secondary | ICD-10-CM

## 2020-08-10 DIAGNOSIS — K089 Disorder of teeth and supporting structures, unspecified: Secondary | ICD-10-CM | POA: Diagnosis not present

## 2020-08-10 DIAGNOSIS — I471 Supraventricular tachycardia: Secondary | ICD-10-CM

## 2020-08-10 DIAGNOSIS — Z780 Asymptomatic menopausal state: Secondary | ICD-10-CM | POA: Diagnosis not present

## 2020-08-10 DIAGNOSIS — H8109 Meniere's disease, unspecified ear: Secondary | ICD-10-CM

## 2020-08-10 DIAGNOSIS — K644 Residual hemorrhoidal skin tags: Secondary | ICD-10-CM | POA: Diagnosis not present

## 2020-08-10 DIAGNOSIS — Z0000 Encounter for general adult medical examination without abnormal findings: Secondary | ICD-10-CM | POA: Diagnosis not present

## 2020-08-10 MED ORDER — AMOXICILLIN-POT CLAVULANATE 875-125 MG PO TABS: 1.0000 | ORAL_TABLET | Freq: Two times a day (BID) | ORAL | 0 refills | Status: AC

## 2020-08-10 NOTE — Patient Instructions (Signed)
Sheri [name] - -  Thank you for coming in to see me today!  For your tooth, please take Amoxicillin-clavulanate twice a day for 10 days.  Let me know if the pain/sweling does not improve.  Please continue to work to find a Pharmacist, community.   For your health maintenance, you are due for a mammogram.  Due to the discharge, we will also do an ultrasound of the right breast.  You are also due for a test of bone density.  These can be scheduled at the same place.   You are also due for a colonoscopy.  Please make an appointment when convenient for you.   For your diarrhea, I think you are having a flare of IBS.  You do not have any worrying symptoms at this time.  I would suggest continuing to drink filtered water and use imodium intermittently.   Please come back to see me in 6 months, sooner if you need to.   Thank you!  Amoxicillin; Clavulanic Acid Extended-Release Tablets What is this medicine? AMOXICILLIN; CLAVULANIC ACID (a mox i SILL in; KLAV yoo lan ic AS id) is a penicillin antibiotic. It treats some infections caused by bacteria. It will not work for colds, the flu, or other viruses. This medicine may be used for other purposes; ask your health care provider or pharmacist if you have questions. COMMON BRAND NAME(S): Augmentin XR What should I tell my health care provider before I take this medicine? They need to know if you have any of these conditions:  kidney disease  liver disease  mononucleosis  stomach or intestine problems such as colitis  an unusual or allergic reaction to amoxicillin, other penicillin or cephalosporin antibiotics, clavulanic acid, other medicines, foods, dyes, or preservatives  pregnant or trying to get pregnant  breast-feeding How should I use this medicine? Take this drug by mouth. Take it as directed on the prescription label at the same time every day. Take it with food at the start of a meal or snack. Do not cut, crush or chew this drug. Swallow the tablets  whole. Take all of this drug unless your health care provider tells you to stop it early. Keep taking it even if you think you are better. Talk to your health care provider about the use of this drug in children. While it may be prescribed for selected conditions, precautions do apply. Overdosage: If you think you have taken too much of this medicine contact a poison control center or emergency room at once. NOTE: This medicine is only for you. Do not share this medicine with others. What if I miss a dose? If you miss a dose, take it as soon as you can. If it is almost time for your next dose, take only that dose. Do not take double or extra doses. What may interact with this medicine?  allopurinol  anticoagulants  birth control pills  methotrexate  probenecid This list may not describe all possible interactions. Give your health care provider a list of all the medicines, herbs, non-prescription drugs, or dietary supplements you use. Also tell them if you smoke, drink alcohol, or use illegal drugs. Some items may interact with your medicine. What should I watch for while using this medicine? Tell your health care provider if your symptoms do not start to get better or if they get worse. This medicine may cause serious skin reactions. They can happen weeks to months after starting the medicine. Contact your health care provider right away  if you notice fevers or flu-like symptoms with a rash. The rash may be red or purple and then turn into blisters or peeling of the skin. Or, you might notice a red rash with swelling of the face, lips or lymph nodes in your neck or under your arms. Do not treat diarrhea with over the counter products. Contact your health care provider if you have diarrhea that lasts more than 2 days or if it is severe and watery. If you have diabetes, you may get a false-positive result for sugar in your urine. Check with your health care provider. Birth control may not work  properly while you are taking this medicine. Talk to your health care provider about using an extra method of birth control. What side effects may I notice from receiving this medicine? Side effects that you should report to your doctor or health care provider as soon as possible:  allergic reactions (skin rash, itching or hives; swelling of the face, lips, or tongue)  bloody or watery diarrhea  dark urine  infection (fever, chills, cough, sore throat, or pain)  kidney injury (trouble passing urine or change in the amount of urine)  redness, blistering, peeling, or loosening of the skin, including inside the mouth  seizures  thrush (white patches in the mouth or mouth sores)  trouble breathing  unusual bruising or bleeding  unusually weak or tired Side effects that usually do not require medical attention (report to your doctor or health care provider if they continue or are bothersome):  diarrhea  dizziness  headache  nausea, vomiting  unusual vaginal discharge, itching, or odor  upset stomach This list may not describe all possible side effects. Call your doctor for medical advice about side effects. You may report side effects to FDA at 1-800-FDA-1088. Where should I keep my medicine? Keep out of the reach of children and pets. Store at room temperature between 20 and 25 degrees C (68 and 77 degrees F). Throw away any unused drug after the expiration date. NOTE: This sheet is a summary. It may not cover all possible information. If you have questions about this medicine, talk to your doctor, pharmacist, or health care provider.  2021 Elsevier/Gold Standard (2020-05-29 14:18:34)   Bone Density Test A bone density test uses a type of X-ray to measure the amount of calcium and other minerals in a person's bones. It can measure bone density in the hip and the spine. The test is similar to having a regular X-ray. This test may also be called:  Bone  densitometry.  Bone mineral density test.  Dual-energy X-ray absorptiometry (DEXA). You may have this test to:  Diagnose a condition that causes weak or thin bones (osteoporosis).  Screen you for osteoporosis.  Predict your risk for a broken bone (fracture).  Determine how well your osteoporosis treatment is working. Tell a health care provider about:  Any allergies you have.  All medicines you are taking, including vitamins, herbs, eye drops, creams, and over-the-counter medicines.  Any problems you or family members have had with anesthetic medicines.  Any blood disorders you have.  Any surgeries you have had.  Any medical conditions you have.  Whether you are pregnant or may be pregnant.  Any medical tests you have had within the past 14 days that used contrast material. What are the risks? Generally, this is a safe test. However, it does expose you to a small amount of radiation, which can slightly increase your cancer risk. What happens  before the test?  Do not take any calcium supplements within the 24 hours before your test.  You will need to remove all metal jewelry, eyeglasses, removable dental appliances, and any other metal objects on your body. What happens during the test?  You will lie down on an exam table. There will be an X-ray generator below you and an imaging device above you.  Other devices, such as boxes or braces, may be used to position your body properly for the scan.  The machine will slowly scan your body. You will need to keep very still while the machine does the scan.  The images will show up on a screen in the room. Images will be examined by a specialist after your test is finished. The procedure may vary among health care providers and hospitals.   What can I expect after the test? It is up to you to get the results of your test. Ask your health care provider, or the department that is doing the test, when your results will be  ready. Summary  A bone density test is an imaging test that uses a type of X-ray to measure the amount of calcium and other minerals in your bones.  The test may be used to diagnose or screen you for a condition that causes weak or thin bones (osteoporosis), predict your risk for a broken bone (fracture), or determine how well your osteoporosis treatment is working.  Do not take any calcium supplements within 24 hours before your test.  Ask your health care provider, or the department that is doing the test, when your results will be ready. This information is not intended to replace advice given to you by your health care provider. Make sure you discuss any questions you have with your health care provider. Document Revised: 12/22/2019 Document Reviewed: 12/22/2019 Elsevier Patient Education  [place].

## 2020-08-10 NOTE — Progress Notes (Signed)
Subjective:    Patient ID: Sheri [name], female    DOB: 21-Dec-1950, 70 y.o.   MRN: [id number]  CC: 2 year follow up for chronic issues including depression/anxiety, SVT  HPI  Sheri [name] is a 70 year old woman with PMH of SVT, Meniere's diease, depression/anxiety followed by Dr. Suella Broad, Internal/External hemorrhoids and 1 year of nipple discharge.   For the nipple discharge, it was noted in last Mammogram 1 year ago.  Greenish, expressed with palpation.  Mammogram was normal at that time, no suspicious lesions.   Today she has no mass and the discharge is approximately the same as previous.  Given age and unilateral nature, repeat mammogram and ultrasound is ordered.  She has mild double vision, but believes this is due to her history of cataract surgery.  She is due to see her ophthalmologist in the near future.   She notes about 1 month of diarrhea.  The worst it will be is 4-5 times per day.  Loose stools, mild blood with wiping and history of hemorrhoids.  No abdominal pain, nausea, vomiting, cramping, fever, chills, foul smell.  She notes that she and her partner have been "trading it back and forth" for the last month.  Her [place]'s [place].  She is on city water and drinks filtered water.  She has a history of IBS.   She recently had a failed ablation procedure for SVT.  She is due to see the cardiologist in early February.  She has a knot at the site of entry on the right groin that she would like evaluated.   Finally, she has a history of poor dentition and issues with her teeth.  She reports 7 day history of pain, swelling of the upper jaw at the site of a broken tooth.  She has not been successful finding a dentist, but will continue to try.   She is overdue for a lot of health maintenance.  Will plan to get some of these ordered today.   Review of Systems  Constitutional: Negative for activity change, appetite change, diaphoresis, fatigue and fever.  HENT: Positive for  dental problem.   Eyes: Positive for visual disturbance. Negative for pain and redness.  Respiratory: Negative for cough and shortness of breath.   Cardiovascular: Positive for palpitations. Negative for chest pain.  Gastrointestinal: Positive for diarrhea. Negative for abdominal distention and abdominal pain.  Psychiatric/Behavioral: Positive for dysphoric mood. Negative for decreased concentration. The patient is nervous/anxious.        Objective:   Physical Exam Vitals and nursing note reviewed.  Constitutional:      General: She is not in acute distress.    Appearance: Normal appearance. She is not toxic-appearing.  HENT:     Head: Normocephalic and atraumatic.     Mouth/Throat:     Pharynx: No oropharyngeal exudate.     Comments: She has very poor dentition, missing multiple teeth.  One molar on the upper left of the mouth is broken with root exposed, necrotic in places, swelling of the gums and erythema around the tooth.  No pus is expressible at this time.  Eyes:     General:        Right eye: No discharge.        Left eye: No discharge.  Cardiovascular:     Rate and Rhythm: Normal rate.  Pulmonary:     Effort: Pulmonary effort is normal. No respiratory distress.  Abdominal:  General: Abdomen is flat. There is no distension.     Tenderness: There is no abdominal tenderness.     Comments: She has a grape sized subcutaneous mass at the site of her arterial puncture in the right groin.  It is tender to palpation, easily mobile, hard.  This does not feel like a lymph node.   Skin:    General: Skin is warm and dry.     Coloration: Skin is not jaundiced.  Neurological:     Mental Status: She is alert and oriented to person, place, and time. Mental status is at baseline.  Psychiatric:        Mood and Affect: Mood normal.        Behavior: Behavior normal.     Stool sample for culture today.      Assessment & Plan:  Return in 6 months, will likely need a telehealth  visit in the interim.

## 2020-08-10 NOTE — Assessment & Plan Note (Signed)
She is overdue for some healthcare maintenance.   Addressed today Mammogram, with ultrasound DEXA Colonoscopy  HCV neg in 2018 HIV neg in 2016

## 2020-08-10 NOTE — Assessment & Plan Note (Signed)
Currently with some diarrhea which is causing some increased bleeding due to hemorrhoids.  She has no complications at this time.  No pain with bowel movements.

## 2020-08-10 NOTE — Assessment & Plan Note (Signed)
She follows with Dr. Suella Broad.  She is on venlafaxine, quetiapine and trazodone.  She has an elevated PHQ-9 today and will continue to follow with this physician.

## 2020-08-10 NOTE — Assessment & Plan Note (Signed)
This has been persistent for a year at this point.  She notes no masses, no nodules, no real changes.  She has some mild double vision, however, the discharge is only unilateral.   Given her age and persistence, I scheduled her for a mammogram and ultrasound (she is due).  I do not think a prolactinoma is likely, but given her double vision if mammogram is normal, will plan to order blood work and an MRI of the brain.

## 2020-08-10 NOTE — Assessment & Plan Note (Signed)
She has an acutely inflamed/infected tooth at this time.  I advised her to find a dentist in the near future as she is at risk for further infections.   Plan Augmentin 875-125 BID for 10 days She will call back if not improving or worsening.

## 2020-08-10 NOTE — Assessment & Plan Note (Signed)
There are no red flags for severe infectious diarrhea at this time.  Given her partner also has symptoms and they frequent well water, will have her bring in a sample for O&P.  I advised her that Augmentin may worsen symptoms for a short period of time.   If initial O&P negative, will repeat X 2 for completion and consider further work up for malabsorption and other syndromes.

## 2020-08-10 NOTE — Assessment & Plan Note (Signed)
She is following with Cardiology.  This is currently controlled on metoprolol.  She reports that an SVT ablation was tried, however, was unsuccessful.  She has a persistent knot at the site of access on her right groin.  This seems to be slowly improving.  I advised her to keep monitoring.  There is no bruising, back pain or other red flags.  I think this is a healing hematoma at this time.   Plan Continue metoprolol and follow up with Cardiology as planned in early February.

## 2020-08-10 NOTE — Assessment & Plan Note (Signed)
See SVT problem.

## 2020-08-10 NOTE — Assessment & Plan Note (Signed)
She is on meclizine.  This is chronic and well controlled at this time.

## 2020-08-21 ENCOUNTER — Telehealth: Payer: Self-pay | Admitting: Cardiology

## 2020-08-21 NOTE — Telephone Encounter (Signed)
Patient is requesting a written copy of all pre-op/post-op (ablation 07/25/20) orders for proof to provide to her employer. She states she also needs documentation with a date when she is eligible to return to work. She is requesting to have this documentation completed by 08/22/20, as this is the deadline for her to have this information sent in. She plans to fax it to her employer. Please assist.

## 2020-08-21 NOTE — Telephone Encounter (Signed)
Returned call to Pt to determine what she needs to pick up from her employer.  Difficult to determine what Pt needs.  This nurse thinks she is asking for the office visit note when her procedure was scheduled.  She also needs a return to work note.  I advised Pt her return to work date was 7 days after her ablation.  Pt states she was not told that.    Advised Pt it was located in her AVS.  Per Pt it is not.  Offered to help assist Pt to find that documentation in her note.  Pt refused to look and states "you are trying to make me upset and I am not supposed to be upset".  Will have primary nurse follow up tomorrow 08/22/20 morning.  Pt is coming to pick up documentation tomorrow.

## 2020-08-22 ENCOUNTER — Other Ambulatory Visit: Payer: Medicare Other

## 2020-08-22 ENCOUNTER — Other Ambulatory Visit: Payer: Self-pay

## 2020-08-22 ENCOUNTER — Encounter (HOSPITAL_COMMUNITY): Payer: Self-pay | Admitting: Cardiology

## 2020-08-22 DIAGNOSIS — R197 Diarrhea, unspecified: Secondary | ICD-10-CM

## 2020-08-22 NOTE — Telephone Encounter (Signed)
Called pt to inform pt that we can address return to work letter when she here tomorrow for her post procedure follow up, informed MD is not in the office today to sign letter. Pt agitated that we cannot get her a letter today, states she has to have it today b/c it is holding her back from returning to work.  Advised pt that she only called yesterday with this request and we will address as quickly as possible but tomorrow is the earliest we can do. Pt very irritated, mumbled something I could not hear/understand and hung up on me.

## 2020-08-23 ENCOUNTER — Telehealth: Payer: Self-pay | Admitting: Internal Medicine

## 2020-08-23 ENCOUNTER — Encounter: Payer: Self-pay | Admitting: *Deleted

## 2020-08-23 ENCOUNTER — Encounter: Payer: Self-pay | Admitting: Cardiology

## 2020-08-23 ENCOUNTER — Other Ambulatory Visit: Payer: Self-pay

## 2020-08-23 ENCOUNTER — Ambulatory Visit (INDEPENDENT_AMBULATORY_CARE_PROVIDER_SITE_OTHER): Payer: Medicare Other | Admitting: Cardiology

## 2020-08-23 VITALS — BP 104/64 | HR 80 | Ht 65.5 in | Wt 150.2 lb

## 2020-08-23 DIAGNOSIS — I471 Supraventricular tachycardia: Secondary | ICD-10-CM | POA: Diagnosis not present

## 2020-08-23 DIAGNOSIS — Z01812 Encounter for preprocedural laboratory examination: Secondary | ICD-10-CM | POA: Diagnosis not present

## 2020-08-23 MED ORDER — DIFICID 200 MG PO TABS: 200.0000 mg | ORAL_TABLET | Freq: Two times a day (BID) | ORAL | 0 refills | Status: AC

## 2020-08-23 MED ORDER — PROPRANOLOL HCL 10 MG PO TABS: 10.0000 mg | ORAL_TABLET | Freq: Two times a day (BID) | ORAL | 3 refills | Status: DC

## 2020-08-23 NOTE — Patient Instructions (Addendum)
Medication Instructions:  Your physician has recommended you make the following change in your medication:  1. STOP Metoprolol 2. START Propranolol 10 mg twice a day  *If you need a refill on your cardiac medications before your next appointment, please call your pharmacy*   Lab Work: Pre procedure labs between 10/01/20 - 10/23/20: for a BMET & CBC If you have labs (blood work) drawn today and your tests are completely normal, you [name] receive your results only by: Marland Kitchen MyChart Message (if you have MyChart) OR . A paper copy in the mail If you have any lab test that is abnormal or we need to change your treatment, we [name] call you to review the results.   Testing/Procedures: Your physician has recommended that you have an ablation. Catheter ablation is a medical procedure used to treat some cardiac arrhythmias (irregular heartbeats). During catheter ablation, a long, thin, flexible tube is put into a blood vessel in your groin (upper thigh), or neck. This tube is called an ablation catheter. It is then guided to your heart through the blood vessel. Radio frequency waves destroy small areas of heart tissue where abnormal heartbeats may cause an arrhythmia to start. Please see the instructions below located under "other instructions".    Follow-Up: At [name], you and your health needs are our priority.  As part of our continuing mission to provide you with exceptional heart care, we have created designated Provider Care Teams.  These Care Teams include your primary Cardiologist (physician) and Advanced Practice Providers (APPs -  Physician Assistants and Nurse Practitioners) who all work together to provide you with the care you need, when you need it.  We recommend signing up for the patient portal called "MyChart".  Sign up information is provided on this After Visit Summary.  MyChart is used to connect with patients for Virtual Visits (Telemedicine).  Patients are able to view lab/test  results, encounter notes, upcoming appointments, etc.  Non-urgent messages can be sent to your provider as well.   To learn more about what you can do with MyChart, go to [email or web address].    Your next appointment:   4 week(s) after your ablation  The format for your next appointment:   In Person  Provider:   Allegra Lai, MD    Thank you for choosing [facility]!!   Sheri Curet, RN [phone]   Other Instructions    Electrophysiology/Ablation Procedure Instructions   You are scheduled for a(n)  ablation on 10/26/2020 with Dr. Allegra [name].   1.   Pre procedure testing-             A.  LAB WORK --- Between 3/14 - 4/05   for your pre procedure blood work.  You do NOT need to be fasting.               B. COVID TEST-- On 10/24/2020 @ 10:00 am - This is a Drive Up Visit at [address] [place], [place] [address].  Someone Sheri [name] direct you to the appropriate testing line. Stay in your car and someone Sheri [name] be with you shortly.   After you are tested please go home and self quarantine until the day of your procedure.     PROCEDURE DAY: 2. On the day of your procedure 10/26/2020 you [name] go to [name] [phone]) at _____________.  You [facility] [name] go to the main entrance [address] [employer]) and enter where the [employer] parking staff are.  Your driver Sheri [name] drop you off and you [facility] [name] head down the hallway to ADMITTING.  You may have one support person come in to the hospital with you.  They Sheri [name] be asked to wait in the waiting room.  It is OK to have someone drop you off and come back when you are ready to be discharged.   3.   Do not eat or drink after midnight prior to your procedure.   4.   Do NOT take any medications the morning of your procedure.   5.  Plan for an overnight stay, but you may be discharged home after your procedure. If you use your phone frequently bring your phone charger, in case you have to stay.  If you are discharged after your procedure  you Sheri [name] need someone to drive you home and be with your for 24 hours after your procedure.   6. You [facility]ri [name] follow up with Dr. Curt [name] 4 weeks after your procedure.  This appointment Sheri [name] be made for you.   * If you have ANY questions please call the office [phone] and ask for Sherri RN or send me a MyChart message   * Occasionally, EP Studies and ablations can become lengthy.  Please make your family aware of this before your procedure starts.  Average time ranges from 2-8 hours for EP studies/ablations.  Your physician Sheri [name] call your family after the procedure with the results.

## 2020-08-23 NOTE — Progress Notes (Signed)
Electrophysiology Office Note   Date:  08/23/2020   ID:  [name], Sheri [name] 06-28-1951, MRN [id number]  PCP:  Sid Falcon, MD  Cardiologist:  Debara Pickett Primary Electrophysiologist:  Rosiland Sen [name], MD    No chief complaint on file.    History of Present Illness: Sheri [name] is a 70 y.o. female who presents today for electrophysiology evaluation.   She has a history of atrial fibrillation.   She was admitted to the hospital August 2014 with atrial fibrillation and was put on a procainamide drip. She converted to sinus rhythm. She had an echo performed which was normal. She is status post AF ablation 10/12/2015. She return to clinic after multiple episodes of SVT. These did convert with IV adenosine. She had a negative EP study 07/25/2020.  Today, denies symptoms of chest pain, shortness of breath, orthopnea, PND, lower extremity edema, claudication, dizziness, presyncope, syncope, bleeding, or neurologic sequela. The patient is tolerating medications without difficulties. After her EP study, she went back into SVT. She was given IV metoprolol which converted her to sinus rhythm. She was discharged on Toprol-XL. She is unfortunately continued to have episodes of SVT despite her Toprol-XL. She does not want medication adjustments and would prefer ablation attempt.  Past Medical History:  Diagnosis Date  . AF (atrial fibrillation) ([place]) 10/12/2015  . Allergic rhinitis   . Anxiety   . ANXIETY DISORDER, GENERALIZED 08/18/2006   Qualifier: Diagnosis of  By: [name] MD, [name]    . Atrial fibrillation with RVR ([place])   . Cancer ([place])    left lower eyelid, basal cell cancer  . Chiari I malformation ([place]) 03/07/2016  . Chronic sinusitis   . 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 COPD (chronic obstructive pulmonary disease) ([place])   . DEGENERATIVE JOINT DISEASE, HANDS    several images revealing OA  . Depression    treated at [facility]  . GERD (gastroesophageal reflux  disease)   . Goiter    hx of goiter  . Hx of atrial tachycardia since age 5 03/15/2013  . Memory loss 12/31/2016  . Mnire's disease 03/07/2016  . Palpitations    Associated with difficulty breathing  . Panic attacks   . Poisoning by selective serotonin reuptake inhibitors(969.03) Jan 2007   severe reaction with MAOI (nardil) and dextromethorphan  . Postmenopausal 12/31/2016  . 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 Following with cardiology.   . Swollen gland 09/14/2018  . Tachycardia   . Tonsillitis 11/12/2018   Past Surgical History:  Procedure Laterality Date  . APPENDECTOMY    . COLONOSCOPY    . ELECTROPHYSIOLOGIC STUDY N/A 10/12/2015   Procedure: Atrial Fibrillation Ablation;  Surgeon: Dmarcus Decicco [name], MD;  Location: [facility];  Service: Cardiovascular;  Laterality: N/A;  . LID LESION EXCISION Left 08/06/2017   Procedure: EXCISION BIOPSY OF EYELID, RECONSTRUCTION OF EYELID;  Surgeon: [name], MD;  Location: [facility];  Service: Ophthalmology;  Laterality: Left;  . OVARIAN CYST REMOVAL  march 1969   left ovarian cystectomy  . right mastoidectomy     ET tube placement in 1980 and 1990  . right sinus surgery  october 2000  . SVT ABLATION N/A 07/25/2020   Procedure: SVT ABLATION;  Surgeon: [name], MD;  Location: [facility];  Service: Cardiovascular;  Laterality: N/A;  . THYROIDECTOMY, PARTIAL     nodule  removal - April 1996     Current Outpatient Medications  Medication Sig Dispense Refill  . meclizine (ANTIVERT) 25 MG tablet TAKE 1 TABLET BY MOUTH THREE TIMES DAILY AS NEEDED FOR DIZZINESS 20 tablet 0  . metoprolol succinate (TOPROL XL) 50 MG 24 hr tablet Take 1 tablet (50 mg total) by mouth daily. Take with or immediately following a meal. 30 tablet 11  . QUEtiapine (SEROQUEL) 200 MG tablet Take 100 mg by mouth at bedtime.    . traZODone  (DESYREL) 100 MG tablet Take 100 mg by mouth at bedtime.    Marland Kitchen venlafaxine (EFFEXOR) 75 MG tablet Take 75 mg by mouth 3 (three) times daily with meals.     . VENTOLIN HFA 108 (90 Base) MCG/ACT inhaler INHALE 1 TO 2 PUFFS BY MOUTH EVERY 6 HOURS AS NEEDED FOR WHEEZING OR SHORTNESS OF BREATH 18 g 2   No current facility-administered medications for this visit.    Allergies:   Guaifenesin   Social History:  The patient  reports that she has never smoked. She has never used smokeless tobacco. She reports that she does not drink alcohol and does not use drugs.   Family History:  The patient's family history includes Atrial fibrillation in her sister and sister; Heart attack in her father; Hypertension in her father and mother; Psoriasis in her father.   ROS:  Please see the history of present illness.   Otherwise, review of systems is positive for none.   All other systems are reviewed and negative.   PHYSICAL EXAM: VS:  BP 104/64   Pulse 80   Ht 5' 5.5" (1.664 m)   Wt 150 lb 3.2 oz (68.1 kg)   SpO2 98%   BMI 24.61 kg/m  , BMI Body mass index is 24.61 kg/m. GEN: Well nourished, well developed, in no acute distress  HEENT: normal  Neck: no JVD, carotid bruits, or masses Cardiac: RRR; no murmurs, rubs, or gallops,no edema  Respiratory:  clear to auscultation bilaterally, normal work of breathing GI: soft, nontender, nondistended, + BS MS: no deformity or atrophy  Skin: warm and dry Neuro:  Strength and sensation are intact Psych: euthymic mood, full affect  EKG:  EKG is ordered today. Personal review of the ekg ordered shows sinus rhythm, rate 80  Recent Labs: 07/23/2020: BUN 13; Creatinine, Ser 0.77; Hemoglobin 13.3; Platelets 370; Potassium 4.8; Sodium 144    Lipid Panel     Component Value Date/Time   CHOL 111 03/16/2013 0355   TRIG 70 03/16/2013 0355   HDL 39 (L) 03/16/2013 0355   CHOLHDL 2.8 03/16/2013 0355   VLDL 14 03/16/2013 0355   LDLCALC 58 03/16/2013 0355     Wt  Readings from Last 3 Encounters:  08/23/20 150 lb 3.2 oz (68.1 kg)  08/10/20 151 lb (68.5 kg)  07/25/20 148 lb (67.1 kg)      Other studies Reviewed: Additional studies/ records that were reviewed today include: TTE 2014  Review of the above records today demonstrates:  - Left ventricle: The cavity size was normal. Wall thickness was increased in a pattern of mild LVH. Systolic function was normal. The estimated ejection fraction was in the range of 55% to 60%. Wall motion was normal; there were no regional wall motion abnormalities. Left ventricular diastolic function parameters were normal. - Left atrium: The atrium was normal in size. - Tricuspid valve: Poorly visualized. Trivial regurgitation. - Pulmonary arteries: PA peak pressure: 61mm Hg (S). - Inferior vena cava: The  vessel was normal in size; the respirophasic diameter changes were in the normal range  50%); findings are consistent with normal central venous pressure.   ASSESSMENT AND PLAN:  1. Paroxysmal atrial fibrillation: Currently on propranolol with a CHA2DS2-VASc of 2. Status post ablation 10/12/2015. No further episodes of atrial fibrillation. Currently not anticoagulated.  2. SVT: Responded to adenosine. Unfortunately had a negative EP study. Unfortunately she has continued to have episodes of SVT. She would like to avoid further medication management and would like a repeat ablation attempt. Risks and benefits of been discussed which were bleeding, tamponade, heart block, stroke, damage to surrounding organs among others. She understands these risks and has agreed to the procedure. She felt better on her propranolol. We Mikle Sternberg stop the Toprol-XL and start her on propranolol.   Current medicines are reviewed at length with the patient today.   The patient does not have concerns regarding her medicines.  The following changes were made today: Stop metoprolol, restart propranolol  Labs/ tests ordered  today include:  Orders Placed This Encounter  Procedures  . EKG 12-Lead     Disposition:   FU with Iliyah Bui 3 months  Signed, Shellie Goettl [name], MD  08/23/2020 4:19 PM     [address] [place] [address] [phone] (office) [phone] (fax)

## 2020-08-23 NOTE — Telephone Encounter (Signed)
Called patient to discuss results.  Confirmed identity with name and DOB.   Stool culture returned as + for toxigenic CDIFF by PCR.  She has been having symptoms for weeks.  She has only recently taken Augmentin and this was after symptoms started.  She notes that her significant other also has the same symptoms and was previously on antibiotics for a leg infection.  She likely was exposed from this source.   I advised her to have her significant other get tested and treated as well.   Will send in an Rx of fidaxomycin (dificid) 200mg  BID for 10 days.  She will call me if Rx is too expensive and will send in PO vancomycin.   She will call back with any issues.   Gilles Chiquito, MD

## 2020-08-27 LAB — CDIFF NAA+O+P+STOOL CULTURE
E coli, Shiga toxin Assay: NEGATIVE
Toxigenic C. Difficile by PCR: POSITIVE — AB

## 2020-08-28 ENCOUNTER — Encounter: Payer: Self-pay | Admitting: *Deleted

## 2020-09-05 ENCOUNTER — Other Ambulatory Visit: Payer: Medicare Other

## 2020-09-24 ENCOUNTER — Encounter: Payer: Self-pay | Admitting: Student

## 2020-09-24 ENCOUNTER — Ambulatory Visit (INDEPENDENT_AMBULATORY_CARE_PROVIDER_SITE_OTHER): Payer: Medicare Other | Admitting: Student

## 2020-09-24 ENCOUNTER — Telehealth: Payer: Self-pay

## 2020-09-24 ENCOUNTER — Other Ambulatory Visit: Payer: Self-pay

## 2020-09-24 VITALS — BP 121/61 | HR 73 | Temp 97.9°F | Ht 65.5 in | Wt 148.1 lb

## 2020-09-24 DIAGNOSIS — H8109 Meniere's disease, unspecified ear: Secondary | ICD-10-CM | POA: Diagnosis not present

## 2020-09-24 DIAGNOSIS — J3489 Other specified disorders of nose and nasal sinuses: Secondary | ICD-10-CM

## 2020-09-24 MED ORDER — ONDANSETRON HCL 4 MG PO TABS: 4.0000 mg | ORAL_TABLET | Freq: Three times a day (TID) | ORAL | 0 refills | Status: DC | PRN

## 2020-09-24 MED ORDER — MECLIZINE HCL 25 MG PO TABS: ORAL_TABLET | ORAL | 0 refills | Status: DC

## 2020-09-24 NOTE — Assessment & Plan Note (Addendum)
Patient reports acute onset of vertigo associated with nausea and vomiting around 1:30 am this morning. States she was woken from her sleep. Reports 3-4 episodes of non-bloody emesis between 1:30 am and 10:30 am. Took meclizine three times in that time period. Called the [facility] to report her symptoms because she has never had N/V associated with her Meniere's and was scheduled for this acute visit.  She reports her vertigo resolved by ~12:30 pm this afternoon, and she is currently nauseated however has not vomited since late morning. Endorses fatigue. States for the last couple of days she has had bilateral ear fullness (whereas typically she has R ear fullness and tinnitus chronically), clear nasal discharge, maxillary sinus pressure, chills but no fevers. Denies sore throat, cough, chest pain, abdominal pain. She is not vaccinated against COVID. Her husband is with her and reports he got tested for COVID a couple of weeks ago and was negative. She denies sick contacts. States she does have seasonal allergies but does not take any medications chronically. Reports she had an episode of vertigo about 1 week ago without N/V which resolved after a couple of hours with PRN meclizine.  On exam, she has tenderness with palpation of maxillary sinuses, normal tympanic membranes, no pharyngeal erythema, unremarkable neurologic exam.  Assessment/Plan: This patient with history of Meniere's disease presents with acute onset of vertigo associated with nausea and vomiting, with resolution of vertigo after 10 hrs with PRN meclizine, still with mild nausea. Preceded by 2 days of rhinorrhea, ear fullness, and sinus pressure. Exam significant for maxillary sinus ttp, unremarkable neurologic exam. Differential includes central and peripheral causes. Given her improvement and reassuring neurological exam, low suspicion for central etiologies. With her history of Meniere's disease, suspect episode due to this, however she does have  upper respiratory symptoms concerning for a possible viral trigger. Since she is unvaccinated and high risk for complications secondary to COVID, will test for COVID and otherwise treat symptomatically with return precautions. - COVID PCR, will contact patient with results and next steps - ondansetron 4 mg every 8 hrs as needed for up to 12 doses - Counseled patient on return precautions - refilled meclizine  ADDENDUM (09/25/20) - Called patient to inform her her COVID test was NEGATIVE. She reports she is feeling much better today and is tolerating PO and staying hydrated. Return precautions given.

## 2020-09-24 NOTE — Telephone Encounter (Signed)
Received TC from patient who states she has Meniere's disease and her meclizine isn't working, she has taken 3 today.  She c/o of the room spinning which is causing n/v.  She states she hasn't had an episode this bad in a very long time.  States the "spinning" gets worse when she is laying down. RN suggested she come in to be evaluated.  Pt in aggreement and states she has someone who can drive her, appt made for today at 1:15 w/ Dr. Shon Baton. SChaplin, RN,BSN

## 2020-09-24 NOTE — Telephone Encounter (Signed)
Agree with plan 

## 2020-09-24 NOTE — Telephone Encounter (Signed)
I connected with  Sheri [name] on 09/24/20 by a video enabled telemedicine application and verified that I am speaking with the correct person using two identifiers.   I discussed the limitations of evaluation and management by telemedicine. The patient expressed understanding and agreed to proceed.  Patient called and is not tolerating oral intake. She was seen in [facility] today for Meniere's disease with acute onset of vertigo associated with nausea and vomiting. She was prescribed ondansetron and meclizine refilled. She called to ask for advice because she continues to vomit. I recommended going to the ED so she can receive IV medications. She says the ED is full and would not like to wait. She will continue oral intake at home for now, but her husband will bring her to the ED if she continues to vomit.   Tamsen Snider, MD PGY2 Internal Medicine [phone]

## 2020-09-24 NOTE — Progress Notes (Signed)
   CC: dizziness, nausea, vomiting  HPI:  [name] is a 70 y.o. woman with history as below who presents to clinic for an acute visit for the above. Her last clinic visit was on 08/10/20.   To see the details of this patient's management of their acute and chronic problems, please refer to the Assessment & Plan under the Encounters tab.    Past Medical History:  Diagnosis Date  . AF (atrial fibrillation) ([place]) 10/12/2015  . Allergic rhinitis   . Anxiety   . ANXIETY DISORDER, GENERALIZED 08/18/2006   Qualifier: Diagnosis of  By: [name] MD, [name]    . Atrial fibrillation with RVR ([place])   . Cancer ([place])    left lower eyelid, basal cell cancer  . Chiari I malformation ([place]) 03/07/2016  . Chronic sinusitis   . 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 COPD (chronic obstructive pulmonary disease) ([place])   . DEGENERATIVE JOINT DISEASE, HANDS    several images revealing OA  . Depression    [facility]  . GERD (gastroesophageal reflux disease)   . Goiter    hx of goiter  . Hx of atrial tachycardia since age 14 03/15/2013  . Memory loss 12/31/2016  . Mnire's disease 03/07/2016  . Palpitations    Associated with difficulty breathing  . Panic attacks   . Poisoning by selective serotonin reuptake inhibitors(969.03) Jan 2007   severe reaction with MAOI (nardil) and dextromethorphan  . Postmenopausal 12/31/2016  . 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 Following with cardiology.   . Swollen gland 09/14/2018  . Tachycardia   . Tonsillitis 11/12/2018   Review of Systems:    Review of Systems  Constitutional: Positive for chills and malaise/fatigue. Negative for fever.  HENT: Positive for congestion, hearing loss, sinus pain and tinnitus. Negative for ear discharge, ear pain and sore throat.   Eyes: Negative for blurred vision.  Respiratory: Negative for cough  and shortness of breath.   Cardiovascular: Negative for chest pain and palpitations.  Gastrointestinal: Positive for nausea and vomiting. Negative for abdominal pain, constipation and diarrhea.  Musculoskeletal: Negative for myalgias.  Neurological: Positive for dizziness. Negative for focal weakness, loss of consciousness and headaches.   Physical Exam:  Vitals:   09/24/20 1333  BP: 121/61  Pulse: 73  Temp: 97.9 F (36.6 C)  TempSrc: Oral  SpO2: 98%  Weight: 148 lb 1.6 oz (67.2 kg)  Height: 5' 5.5" (1.664 m)   Constitutional: chronically-ill and tired-appearing woman sitting in chair, appears uncomfortable HENT: normocephalic atraumatic, mucous membranes mildly dry; mild tenderness to palpation of maxillary sinuses; external auditory canal non-erythematous, bilateral tympanic membranes transparent and non-bulging Eyes: conjunctiva non-erythematous Neck: supple Cardiovascular: regular rate and rhythm, no m/r/g Pulmonary/Chest: normal work of breathing on room air, lungs clear to auscultation bilaterally Abdominal: soft, non-tender, non-distended MSK: normal bulk and tone Neurological: alert & oriented x 3; PERRL; 5/5 strength in bilateral upper and lower extremities; sensation grossly intact; negative Romberg Skin: warm and dry Psych: normal mood and affect  Assessment & Plan:   See Encounters Tab for problem based charting.  Patient discussed with Dr. Dareen Piano

## 2020-09-24 NOTE — Patient Instructions (Signed)
Ms. Thoma,   Thank you for your visit to the [facility] today. It was a pleasure meeting you. Today we discussed the following:  1) Vertigo, nausea, vomiting - I'm sorry you are feeling poorly. It is reassuring that your dizziness and nausea have improved. - Your symptoms of runny nose and sinus fullness are suggestive of inflammation of your sinuses due to a virus. I tested you for COVID-19 and will call you with the result. We can discuss next steps for treatment if you test positive. - In the meantime, I have sent a prescription for an anti-nausea medication (ondansetron) to your pharmacy and refilled your meclizine. Recommend rest and adequate hydration. - Please call back if you develop worsening sinus pain or otherwise do not get relief from your symptoms. As always, go to the ED if you feel like you are having severe symptoms.   We would like to see you back for your regularly scheduled follow-up as per your PCP.    If you have any questions or concerns, please call our clinic at [phone] between 9am-5pm. Outside of these hours, call [phone] and ask for the internal medicine resident on call. If you feel you are having a medical emergency please call 911.

## 2020-09-25 LAB — SARS-COV-2, NAA 2 DAY TAT

## 2020-09-25 LAB — NOVEL CORONAVIRUS, NAA: SARS-CoV-2, NAA: NOT DETECTED

## 2020-09-26 NOTE — Progress Notes (Signed)
Internal Medicine Clinic Attending  Case discussed with Dr. Watson  At the time of the visit.  We reviewed the resident's history and exam and pertinent patient test results.  I agree with the assessment, diagnosis, and plan of care documented in the resident's note.  

## 2020-10-01 ENCOUNTER — Telehealth: Payer: Self-pay | Admitting: *Deleted

## 2020-10-01 NOTE — Telephone Encounter (Signed)
Reviewed ablation instructions for next month. covid screening scheduled. Pt will stop by and get lab work prior to covid testing. Aware office will call to arrange post procedure follow up.

## 2020-10-16 ENCOUNTER — Ambulatory Visit (AMBULATORY_SURGERY_CENTER): Payer: Self-pay | Admitting: *Deleted

## 2020-10-16 ENCOUNTER — Other Ambulatory Visit: Payer: Self-pay

## 2020-10-16 ENCOUNTER — Telehealth: Payer: Self-pay | Admitting: *Deleted

## 2020-10-16 VITALS — Ht 65.5 in | Wt 150.2 lb

## 2020-10-16 DIAGNOSIS — Z1211 Encounter for screening for malignant neoplasm of colon: Secondary | ICD-10-CM

## 2020-10-16 NOTE — Telephone Encounter (Signed)
Cyril Mourning,  It would be prudent to delay her screening colonoscopy until it is confirmed her ablation for SVT was successful.  If she continues to have SVT episodes her colonoscopy will need to be performed at the hospital.  Thanks,  Osvaldo Angst

## 2020-10-16 NOTE — Telephone Encounter (Signed)
Noted. Attempted to reach pt to discuss J Nulty's recommendations- no answer, LMOM to call back

## 2020-10-16 NOTE — Telephone Encounter (Signed)
Dr. Carlean Purl and Jenny Reichmann,  This pt is having a screening colonoscopy on 10-31-20.  I noted she is having an SVT ablation on 10-26-20.... is she ok to proceed with colonoscopy or would you like her to postpone colonoscopy?  Thanks, [employer]

## 2020-10-16 NOTE — Progress Notes (Signed)
  No trouble with anesthesia, denies being told they were difficult to intubate, or hx/fam hx of malignant hyperthermia per pt   No egg or soy allergy  No home oxygen use   No medications for weight loss taken  Pt denies constipation issues     

## 2020-10-17 NOTE — Telephone Encounter (Signed)
Was able to reach pt. Explained that Dr. Carlean Purl and [facility] anesthesiologist head felt it was in her best interest to be sure that the ablation for her SVT was a success before doing a colonoscopy.  It was noted that her procedure would be cancelled for 10/31/20 and for her to call once she is cleared from her cardiologist that her ablation was successful.  If [facility] has not heard from her by July, we would do a recall to get her back on the schedule.  Pt acknowledged she understood information given.

## 2020-10-17 NOTE — Telephone Encounter (Signed)
We need to cancel her current appointments and place a recall for July and explained that to the patient.  This is the medically sensible thing to do.  Let me know if she has questions.

## 2020-10-19 ENCOUNTER — Other Ambulatory Visit: Payer: Medicare Other

## 2020-10-24 ENCOUNTER — Other Ambulatory Visit: Payer: Medicare Other | Admitting: *Deleted

## 2020-10-24 ENCOUNTER — Other Ambulatory Visit: Payer: Self-pay

## 2020-10-24 ENCOUNTER — Other Ambulatory Visit (HOSPITAL_COMMUNITY): Payer: Medicare Other

## 2020-10-24 ENCOUNTER — Other Ambulatory Visit (HOSPITAL_COMMUNITY)
Admission: RE | Admit: 2020-10-24 | Discharge: 2020-10-24 | Disposition: A | Discharge status: Home / Self Care | Payer: Medicare Other | Source: Ambulatory Visit | Attending: Cardiology | Admitting: Cardiology

## 2020-10-24 DIAGNOSIS — Z01812 Encounter for preprocedural laboratory examination: Secondary | ICD-10-CM

## 2020-10-24 DIAGNOSIS — I471 Supraventricular tachycardia: Secondary | ICD-10-CM

## 2020-10-24 DIAGNOSIS — Z20822 Contact with and (suspected) exposure to covid-19: Secondary | ICD-10-CM | POA: Insufficient documentation

## 2020-10-24 LAB — SARS CORONAVIRUS 2 (TAT 6-24 HRS): SARS Coronavirus 2: NEGATIVE

## 2020-10-25 LAB — BASIC METABOLIC PANEL
BUN/Creatinine Ratio: 14 (ref 12–28)
BUN: 12 mg/dL (ref 8–27)
CO2: 21 mmol/L (ref 20–29)
Calcium: 9.3 mg/dL (ref 8.7–10.3)
Chloride: 108 mmol/L — ABNORMAL HIGH (ref 96–106)
Creatinine, Ser: 0.83 mg/dL (ref 0.57–1.00)
Glucose: 111 mg/dL — ABNORMAL HIGH (ref 65–99)
Potassium: 4.3 mmol/L (ref 3.5–5.2)
Sodium: 146 mmol/L — ABNORMAL HIGH (ref 134–144)
eGFR: 76 mL/min/{1.73_m2} (ref >59)

## 2020-10-25 LAB — CBC
Hematocrit: 38.9 % (ref 34.0–46.6)
Hemoglobin: 13.2 g/dL (ref 11.1–15.9)
MCH: 29.8 pg (ref 26.6–33.0)
MCHC: 33.9 g/dL (ref 31.5–35.7)
MCV: 88 fL (ref 79–97)
Platelets: 333 10*3/uL (ref 150–450)
RBC: 4.43 x10E6/uL (ref 3.77–5.28)
RDW: 12.1 % (ref 11.7–15.4)
WBC: 8.5 10*3/uL (ref 3.4–10.8)

## 2020-10-25 NOTE — Pre-Procedure Instructions (Signed)
Instructed patient on the following items: Arrival time 0830 Nothing to eat or drink after midnight No meds AM of procedure Responsible person to drive you home and stay with you for 24 hrs    

## 2020-10-25 NOTE — Telephone Encounter (Signed)
Pt aware to arrive at 8:30 am tomorrow morning for procedure. Patient verbalized understanding and agreeable to plan.

## 2020-10-26 ENCOUNTER — Ambulatory Visit (HOSPITAL_COMMUNITY): Payer: Medicare Other | Admitting: Anesthesiology

## 2020-10-26 ENCOUNTER — Other Ambulatory Visit: Payer: Self-pay

## 2020-10-26 ENCOUNTER — Encounter (HOSPITAL_COMMUNITY): Payer: Self-pay | Admitting: Cardiology

## 2020-10-26 ENCOUNTER — Encounter (HOSPITAL_COMMUNITY)
Admission: RE | Disposition: A | Discharge status: Home / Self Care | Payer: Medicare Other | Source: Home / Self Care | Attending: Cardiology

## 2020-10-26 ENCOUNTER — Ambulatory Visit (HOSPITAL_COMMUNITY)
Admission: RE | Admit: 2020-10-26 | Discharge: 2020-10-26 | Disposition: A | Discharge status: Home / Self Care | Payer: Medicare Other | Attending: Cardiology | Admitting: Cardiology

## 2020-10-26 DIAGNOSIS — I480 Paroxysmal atrial fibrillation: Secondary | ICD-10-CM | POA: Diagnosis not present

## 2020-10-26 DIAGNOSIS — Z8249 Family history of ischemic heart disease and other diseases of the circulatory system: Secondary | ICD-10-CM | POA: Insufficient documentation

## 2020-10-26 DIAGNOSIS — I471 Supraventricular tachycardia: Secondary | ICD-10-CM | POA: Diagnosis present

## 2020-10-26 DIAGNOSIS — Z888 Allergy status to other drugs, medicaments and biological substances status: Secondary | ICD-10-CM | POA: Insufficient documentation

## 2020-10-26 DIAGNOSIS — Z85828 Personal history of other malignant neoplasm of skin: Secondary | ICD-10-CM | POA: Diagnosis not present

## 2020-10-26 DIAGNOSIS — Z9049 Acquired absence of other specified parts of digestive tract: Secondary | ICD-10-CM | POA: Insufficient documentation

## 2020-10-26 HISTORY — PX: SVT ABLATION: EP1225

## 2020-10-26 SURGERY — SVT ABLATION
Anesthesia: General

## 2020-10-26 MED ORDER — PROPOFOL 10 MG/ML IV BOLUS
INTRAVENOUS | Status: DC | PRN
  Administered 2020-10-26: 40 mg via INTRAVENOUS
  Administered 2020-10-26: 20 mg via INTRAVENOUS

## 2020-10-26 MED ORDER — PROPOFOL 500 MG/50ML IV EMUL
INTRAVENOUS | Status: DC | PRN
  Administered 2020-10-26: 100 ug/kg/min via INTRAVENOUS

## 2020-10-26 MED ORDER — PHENYLEPHRINE 40 MCG/ML (10ML) SYRINGE FOR IV PUSH (FOR BLOOD PRESSURE SUPPORT)
PREFILLED_SYRINGE | INTRAVENOUS | Status: DC | PRN
  Administered 2020-10-26 (×3): 80 ug via INTRAVENOUS

## 2020-10-26 MED ORDER — FENTANYL CITRATE (PF) 100 MCG/2ML IJ SOLN
INTRAMUSCULAR | Status: AC
  Filled 2020-10-26: qty 2

## 2020-10-26 MED ORDER — SODIUM CHLORIDE 0.9 % IV SOLN: INTRAVENOUS | Status: DC

## 2020-10-26 MED ORDER — ONDANSETRON HCL 4 MG/2ML IJ SOLN: 4.0000 mg | Freq: Four times a day (QID) | INTRAMUSCULAR | Status: DC | PRN

## 2020-10-26 MED ORDER — ACETAMINOPHEN 325 MG PO TABS: 650.0000 mg | ORAL_TABLET | ORAL | Status: DC | PRN

## 2020-10-26 MED ORDER — ONDANSETRON HCL 4 MG/2ML IJ SOLN
INTRAMUSCULAR | Status: DC | PRN
  Administered 2020-10-26: 4 mg via INTRAVENOUS

## 2020-10-26 MED ORDER — FENTANYL CITRATE (PF) 100 MCG/2ML IJ SOLN
INTRAMUSCULAR | Status: DC | PRN
  Administered 2020-10-26 (×2): 50 ug via INTRAVENOUS

## 2020-10-26 MED ORDER — ISOPROTERENOL HCL 0.2 MG/ML IJ SOLN
INTRAMUSCULAR | Status: AC
  Filled 2020-10-26: qty 5

## 2020-10-26 MED ORDER — METOPROLOL SUCCINATE ER 100 MG PO TB24: 100.0000 mg | ORAL_TABLET | Freq: Every day | ORAL | 11 refills | Status: DC

## 2020-10-26 MED ORDER — MIDAZOLAM HCL 5 MG/5ML IJ SOLN
INTRAMUSCULAR | Status: DC | PRN
  Administered 2020-10-26 (×2): 1 mg via INTRAVENOUS

## 2020-10-26 MED ORDER — SODIUM CHLORIDE 0.9% FLUSH: 3.0000 mL | INTRAVENOUS | Status: DC | PRN

## 2020-10-26 MED ORDER — BUPIVACAINE HCL (PF) 0.25 % IJ SOLN
INTRAMUSCULAR | Status: DC | PRN
  Administered 2020-10-26: 60 mL

## 2020-10-26 MED ORDER — ISOPROTERENOL HCL 0.2 MG/ML IJ SOLN
INTRAVENOUS | Status: DC | PRN
  Administered 2020-10-26: 2 ug/min via INTRAVENOUS

## 2020-10-26 MED ORDER — SODIUM CHLORIDE 0.9 % IV SOLN: 250.0000 mL | INTRAVENOUS | Status: DC | PRN

## 2020-10-26 MED ORDER — HEPARIN (PORCINE) IN NACL 1000-0.9 UT/500ML-% IV SOLN
INTRAVENOUS | Status: DC | PRN
  Administered 2020-10-26 (×2): 500 mL

## 2020-10-26 MED ORDER — MIDAZOLAM HCL 2 MG/2ML IJ SOLN
INTRAMUSCULAR | Status: AC
  Filled 2020-10-26: qty 2

## 2020-10-26 SURGICAL SUPPLY — 12 items
BAG SNAP BAND KOVER 36X36 (MISCELLANEOUS) ×2 IMPLANT
CATH JOSEPHSON QUAD-ALLRED 6FR (CATHETERS) ×2 IMPLANT
CATH WEBSTER BI DIR CS D-F CRV (CATHETERS) ×1 IMPLANT
CLOSURE PERCLOSE PROSTYLE (VASCULAR PRODUCTS) ×4 IMPLANT
PACK EP LATEX FREE (CUSTOM PROCEDURE TRAY) ×2
PACK EP LF (CUSTOM PROCEDURE TRAY) ×1 IMPLANT
PAD PRO RADIOLUCENT 2001M-C (PAD) ×2 IMPLANT
PATCH CARTO3 (PAD) ×1 IMPLANT
SHEATH PINNACLE 6F 10CM (SHEATH) ×2 IMPLANT
SHEATH PINNACLE 7F 10CM (SHEATH) ×1 IMPLANT
SHEATH PINNACLE 8F 10CM (SHEATH) ×2 IMPLANT
SHEATH PROBE COVER 6X72 (BAG) ×1 IMPLANT

## 2020-10-26 NOTE — Anesthesia Procedure Notes (Signed)
Procedure Name: MAC Date/Time: 10/26/2020 11:45 AM Performed by: Inda Coke, CRNA Pre-anesthesia Checklist: Patient identified, Emergency Drugs available, Suction available, Timeout performed and Patient being monitored Patient Re-evaluated:Patient Re-evaluated prior to induction Oxygen Delivery Method: Simple face mask Induction Type: IV induction Dental Injury: Teeth and Oropharynx as per pre-operative assessment

## 2020-10-26 NOTE — Transfer of Care (Signed)
Immediate Anesthesia Transfer of Care Note  Patient: Chauncey Cruel Perales  Procedure(s) Performed: SVT ABLATION (N/A )  Patient Location: PACU  Anesthesia Type:MAC  Level of Consciousness: awake, alert  and oriented  Airway & Oxygen Therapy: Patient Spontanous Breathing and Patient connected to nasal cannula oxygen  Post-op Assessment: Report given to RN and Post -op Vital signs reviewed and stable  Post vital signs: Reviewed and stable  Last Vitals:  Vitals Value Taken Time  BP 111/79 10/26/20 1325  Temp 36.3 C 10/26/20 1315  Pulse 96 10/26/20 1326  Resp 21 10/26/20 1326  SpO2 100 % 10/26/20 1326  Vitals shown include unvalidated device data.  Last Pain:  Vitals:   10/26/20 1315  TempSrc: Temporal  PainSc: 0-No pain         Complications: No complications documented.

## 2020-10-26 NOTE — Discharge Instructions (Signed)

## 2020-10-26 NOTE — Anesthesia Preprocedure Evaluation (Addendum)
Anesthesia Evaluation  Patient identified by MRN, date of birth, ID band Patient awake    Reviewed: Allergy & Precautions, NPO status , Patient's Chart, lab work & pertinent test results, reviewed documented beta blocker date and time   Airway Mallampati: II  TM Distance: >3 FB Neck ROM: Full    Dental  (+) Teeth Intact   Pulmonary neg pulmonary ROS,    Pulmonary exam normal        Cardiovascular + dysrhythmias Atrial Fibrillation and Supra Ventricular Tachycardia  Rhythm:Irregular Rate:Normal     Neuro/Psych Anxiety Depression negative neurological ROS     GI/Hepatic Neg liver ROS, GERD  Medicated,  Endo/Other  negative endocrine ROS  Renal/GU negative Renal ROS  negative genitourinary   Musculoskeletal  (+) Arthritis , Osteoarthritis,    Abdominal (+)  Abdomen: soft. Bowel sounds: normal.  Peds  Hematology negative hematology ROS (+)   Anesthesia Other Findings   Reproductive/Obstetrics                             Anesthesia Physical Anesthesia Plan  ASA: III  Anesthesia Plan: MAC   Post-op Pain Management:    Induction: Intravenous  PONV Risk Score and Plan: 2 and Ondansetron, Dexamethasone and Treatment may vary due to age or medical condition  Airway Management Planned: Simple Face Mask, Natural Airway and Nasal Cannula  Additional Equipment: None  Intra-op Plan:   Post-operative Plan:   Informed Consent: I have reviewed the patients History and Physical, chart, labs and discussed the procedure including the risks, benefits and alternatives for the proposed anesthesia with the patient or authorized representative who has indicated his/her understanding and acceptance.     Dental advisory given  Plan Discussed with: CRNA  Anesthesia Plan Comments: (Lab Results      Component                Value               Date                      WBC                      8.5                  10/24/2020                HGB                      13.2                10/24/2020                HCT                      38.9                10/24/2020                MCV                      88                  10/24/2020                PLT  333                 10/24/2020           Lab Results      Component                Value               Date                      NA                       146 (H)             10/24/2020                K                        4.3                 10/24/2020                CO2                      21                  10/24/2020                GLUCOSE                  111 (H)             10/24/2020                BUN                      12                  10/24/2020                CREATININE               0.83                10/24/2020                CALCIUM                  9.3                 10/24/2020                GFRNONAA                 79                  07/23/2020                GFRAA                    91                  07/23/2020          )       Anesthesia Quick Evaluation

## 2020-10-26 NOTE — Progress Notes (Signed)
Pt ambulated without difficulty or bleeding.   Discharged home with husband who will drive and stay with pt x 24 hrs  

## 2020-10-26 NOTE — Anesthesia Postprocedure Evaluation (Signed)
Anesthesia Post Note  Patient: Sheri [name]  Procedure(s) Performed: SVT ABLATION (N/A )     Patient location during evaluation: PACU Anesthesia Type: General Level of consciousness: awake and alert Pain management: pain level controlled Vital Signs Assessment: post-procedure vital signs reviewed and stable Respiratory status: spontaneous breathing, nonlabored ventilation, respiratory function stable and patient connected to nasal cannula oxygen Cardiovascular status: blood pressure returned to baseline and stable Postop Assessment: no apparent nausea or vomiting Anesthetic complications: no   No complications documented.  Last Vitals:  Vitals:   10/26/20 1515 10/26/20 1545  BP: 97/63 (!) 108/56  Pulse: 89 94  Resp: 17 (!) 29  Temp:    SpO2: 100% 100%    Last Pain:  Vitals:   10/26/20 1407  TempSrc:   PainSc: 0-No pain                 Belenda Cruise P Roemello Speyer

## 2020-10-26 NOTE — H&P (Signed)
Electrophysiology Office Note   Date:  10/26/2020   ID:  Sheri [name], DOB 06/08/1951, MRN [id number]  PCP:  Sid Falcon, MD  Cardiologist:  Debara Pickett Primary Electrophysiologist:  Thereasa Iannello [name], MD    No chief complaint on file.    History of Present Illness: Sheri [name] is a 70 y.o. female who presents today for electrophysiology evaluation.   She has a history of atrial fibrillation.   She was admitted to the hospital August 2014 with atrial fibrillation and was put on a procainamide drip. She converted to sinus rhythm. She had an echo performed which was normal. She is status post AF ablation 10/12/2015. She return to clinic after multiple episodes of SVT. These did convert with IV adenosine. She had a negative EP study 07/25/2020.  Today, denies symptoms of palpitations, chest pain, shortness of breath, orthopnea, PND, lower extremity edema, claudication, dizziness, presyncope, syncope, bleeding, or neurologic sequela. The patient is tolerating medications without difficulties. Plan for ablation.   Past Medical History:  Diagnosis Date  . AF (atrial fibrillation) ([place]) 10/12/2015  . Allergic rhinitis   . Anxiety   . ANXIETY DISORDER, GENERALIZED 08/18/2006   Qualifier: Diagnosis of  By: [name] MD, [name]    . Arthritis    no per pt  . Atrial fibrillation with RVR ([place])   . Cancer ([place])    left lower eyelid, basal cell cancer  . Chiari I malformation ([place]) 03/07/2016   pt unsure of this  . Chronic sinusitis   . 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 COPD (chronic obstructive pulmonary disease) ([facility])    no per pt  . Depression    treated at [facility]  . GERD (gastroesophageal reflux disease)   . Goiter    hx of goiter  . Hx of atrial tachycardia since age 46 03/15/2013  . Memory loss 12/31/2016  . Mnire's disease 03/07/2016  . Palpitations    Associated with difficulty breathing  . Panic attacks   . Poisoning by selective serotonin reuptake  inhibitors(969.03) Jan 2007   severe reaction with MAOI (nardil) and dextromethorphan  . Postmenopausal 12/31/2016  . 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 Following with cardiology.   . Swollen gland 09/14/2018  . Tachycardia   . Tonsillitis 11/12/2018   Past Surgical History:  Procedure Laterality Date  . APPENDECTOMY    . COLONOSCOPY    . ELECTROPHYSIOLOGIC STUDY N/A 10/12/2015   Procedure: Atrial Fibrillation Ablation;  Surgeon: Mercadez Heitman [name], MD;  Location: [facility];  Service: Cardiovascular;  Laterality: N/A;  . LID LESION EXCISION Left 08/06/2017   Procedure: EXCISION BIOPSY OF EYELID, RECONSTRUCTION OF EYELID;  Surgeon: [name], MD;  Location: [facility];  Service: Ophthalmology;  Laterality: Left;  . OVARIAN CYST REMOVAL  09/1967   left ovarian cystectomy- 17.5 lb cyst removed- per pt  . right mastoidectomy     ET tube placement in 1980 and 1990  . right sinus surgery  october 2000  . SVT ABLATION N/A 07/25/2020   Procedure: SVT ABLATION;  Surgeon: [name], MD;  Location: [facility];  Service: Cardiovascular;  Laterality: N/A;  . THYROIDECTOMY, PARTIAL     nodule removal - April 1996     Current Facility-Administered Medications  Medication Dose Route Frequency Provider Last Rate Last Admin  . 0.9 %  sodium chloride infusion  Intravenous Continuous [name], MD 50 mL/hr at 10/26/20 0906 New Bag at 10/26/20 0906    Allergies:   Guaifenesin   Social History:  The patient  reports that she has never smoked. She has never used smokeless tobacco. She reports that she does not drink alcohol and does not use drugs.   Family History:  The patient's family history includes Atrial fibrillation in her sister and sister; Heart attack in her father; Hypertension in her father and mother; Psoriasis in her father.   ROS:  Please see the  history of present illness.   Otherwise, review of systems is positive for none.   All other systems are reviewed and negative.   PHYSICAL EXAM: VS:  BP 118/75   Pulse (!) 105   Temp 98 F (36.7 C) (Oral)   Resp 16   Ht 5\' 5"  (1.651 m)   Wt 68 kg   SpO2 99%   BMI 24.96 kg/m  , BMI Body mass index is 24.96 kg/m. GEN: Well nourished, well developed, in no acute distress  HEENT: normal  Neck: no JVD, carotid bruits, or masses Cardiac: RRR; no murmurs, rubs, or gallops,no edema  Respiratory:  clear to auscultation bilaterally, normal work of breathing GI: soft, nontender, nondistended, + BS MS: no deformity or atrophy  Skin: warm and dry Neuro:  Strength and sensation are intact Psych: euthymic mood, full affect  Recent Labs: 10/24/2020: BUN 12; Creatinine, Ser 0.83; Hemoglobin 13.2; Platelets 333; Potassium 4.3; Sodium 146    Lipid Panel     Component Value Date/Time   CHOL 111 03/16/2013 0355   TRIG 70 03/16/2013 0355   HDL 39 (L) 03/16/2013 0355   CHOLHDL 2.8 03/16/2013 0355   VLDL 14 03/16/2013 0355   LDLCALC 58 03/16/2013 0355     Wt Readings from Last 3 Encounters:  10/26/20 68 kg  10/16/20 68.1 kg  09/24/20 67.2 kg      Other studies Reviewed: Additional studies/ records that were reviewed today include: TTE 2014  Review of the above records today demonstrates:  - Left ventricle: The cavity size was normal. Wall thickness was increased in a pattern of mild LVH. Systolic function was normal. The estimated ejection fraction was in the range of 55% to 60%. Wall motion was normal; there were no regional wall motion abnormalities. Left ventricular diastolic function parameters were normal. - Left atrium: The atrium was normal in size. - Tricuspid valve: Poorly visualized. Trivial regurgitation. - Pulmonary arteries: PA peak pressure: 74mm Hg (S). - Inferior vena cava: The vessel was normal in size; the respirophasic diameter changes were in the  normal range  50%); findings are consistent with normal central venous pressure.   ASSESSMENT AND PLAN:  1. Paroxysmal atrial fibrillation: Currently on propranolol with a CHA2DS2-VASc of 2. Status post ablation 10/12/2015. No further episodes of atrial fibrillation. Currently not anticoagulated.  2. SVT: Sheri [name] has presented today for surgery, with the diagnosis of SVT.  The various methods of treatment have been discussed with the patient and family. After consideration of risks, benefits and other options for treatment, the patient has consented to  Procedure(s): Catheter ablation as a surgical intervention .  Risks include but not limited to complete heart block, stroke, esophageal damage, nerve damage, bleeding, vascular damage, tamponade, perforation, MI, and death. The patient's history has been reviewed, patient examined, no change in status, stable for surgery.  I have reviewed the patient's chart and labs.  Questions were answered to  the patient's satisfaction.    [place] [name], MD 10/26/2020 10:59 AM

## 2020-10-29 ENCOUNTER — Encounter (HOSPITAL_COMMUNITY): Payer: Self-pay | Admitting: Cardiology

## 2020-10-30 ENCOUNTER — Encounter (HOSPITAL_COMMUNITY): Payer: Self-pay | Admitting: Cardiology

## 2020-10-31 ENCOUNTER — Encounter: Payer: Medicare Other | Admitting: Internal Medicine

## 2020-11-02 ENCOUNTER — Telehealth: Payer: Self-pay | Admitting: Cardiology

## 2020-11-02 NOTE — Telephone Encounter (Signed)
Patient states that they had to use right and left groin sites for her ablation on 10/26/20. The right is healing very well but she noticed that the left has a small amount of drainage that is a clear yellow, no smell to drainage, one small gauze over the area catches all the drainage with no need to change during the day. No fevers have been noted and the site has no extra pain when compared to the right. The patient felt like she should let Dr. [name] know what is occurring.  Checked with Dr. Lovena Le in the office, okay for Dr. [name] to review Monday.  Advised patient the office will call her next week and check on her. Over the weekend if she develops a fever or the drainage starts to smell or turn into pus like, to call the on call attending. If she is able to, let the site breath without gauze.  Patient agreeable and verbalized understanding.

## 2020-11-02 NOTE — Telephone Encounter (Signed)
Patient called to say that on last Friday she was supposed to have producer done but didn't happen because he could do the ablation. But patient states that she is having drainage on the left side and she stated the is yellowish color coming from the cut.

## 2020-11-05 ENCOUNTER — Telehealth: Payer: Self-pay | Admitting: Cardiology

## 2020-11-05 NOTE — Progress Notes (Signed)
Cardiology Office Note Date:  11/06/2020  Patient ID:  Sheri [name], Sheri [name] 06-Sep-1950, MRN [id number] PCP:  Sid Falcon, MD  Cardiologist:  Dr. Debara Pickett Electrophysiologist: Dr. [name]     Chief Complaint:  Oozing/drainage post ablation at b/l sites  History of Present Illness: Sheri [name] is a 70 y.o. female with history of COPD, OA, Afib, SVT.  With increased SVT burden he underwent an EPS 07/25/20 unable to induce her tachycardia. Had her tachycardia in post op, discharged on Toprol. She saw Dr. [name] 08/23/20 in follow up and wanted to avoid the medication and asked to pursue another EPs and hopefully ablation.  She underwent EPS again 10/26/20 and again without inducible arrhythmias  She called yesterday reporting bleeding/drainage at both groins since her procedure, chaing gause dressing BID.  She was asking to see Dr. [name] though not in the office.  She reported no drainage up to that point yesterday and took an appt to be seen by me today.  TODAY She noted a day or so after the procedure the gauze was saturated and removed them (both sides) since then has had a slight felt to be persistent drainage and bleeding from both sides. And has kept a piece of gauze or tissue there having to change timee 2x a day Not painful No symtoms of illness or fever  No CP, no SVTs No SOB   AFib Hx Diagnosed 2014 PVI ablation 10/12/2015   Past Medical History:  Diagnosis Date  . AF (atrial fibrillation) ([place]) 10/12/2015  . Allergic rhinitis   . Anxiety   . ANXIETY DISORDER, GENERALIZED 08/18/2006   Qualifier: Diagnosis of  By: [name] MD, [name]    . Arthritis    no per pt  . Atrial fibrillation with RVR ([place])   . Cancer ([place])    left lower eyelid, basal cell cancer  . Chiari I malformation ([place]) 03/07/2016   pt unsure of this  . Chronic sinusitis   . 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 COPD (chronic obstructive pulmonary disease) ([facility])    no per pt  . Depression     treated at [facility]  . GERD (gastroesophageal reflux disease)   . Goiter    hx of goiter  . Hx of atrial tachycardia since age 49 03/15/2013  . Memory loss 12/31/2016  . Mnire's disease 03/07/2016  . Palpitations    Associated with difficulty breathing  . Panic attacks   . Poisoning by selective serotonin reuptake inhibitors(969.03) Jan 2007   severe reaction with MAOI (nardil) and dextromethorphan  . Postmenopausal 12/31/2016  . 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 Following with cardiology.   . Swollen gland 09/14/2018  . Tachycardia   . Tonsillitis 11/12/2018    Past Surgical History:  Procedure Laterality Date  . APPENDECTOMY    . COLONOSCOPY    . ELECTROPHYSIOLOGIC STUDY N/A 10/12/2015   Procedure: Atrial Fibrillation Ablation;  Surgeon: [name], MD;  Location: [facility];  Service: Cardiovascular;  Laterality: N/A;  . LID LESION EXCISION Left 08/06/2017   Procedure: EXCISION BIOPSY OF EYELID, RECONSTRUCTION OF EYELID;  Surgeon: [name], MD;  Location: [facility];  Service: Ophthalmology;  Laterality: Left;  . OVARIAN CYST REMOVAL  09/1967   left ovarian cystectomy- 17.5 lb cyst removed- per pt  . right mastoidectomy     ET tube placement in 1980 and  1990  . right sinus surgery  october 2000  . SVT ABLATION N/A 07/25/2020   Procedure: SVT ABLATION;  Surgeon: [name], MD;  Location: [facility];  Service: Cardiovascular;  Laterality: N/A;  . SVT ABLATION N/A 10/26/2020   Procedure: SVT ABLATION;  Surgeon: [name], MD;  Location: [facility];  Service: Cardiovascular;  Laterality: N/A;  . THYROIDECTOMY, PARTIAL     nodule removal - April 1996    Current Outpatient Medications  Medication Sig Dispense Refill  . meclizine (ANTIVERT) 25 MG tablet TAKE 1 TABLET BY MOUTH THREE TIMES DAILY AS NEEDED FOR DIZZINESS  20 tablet 0  . metoprolol succinate (TOPROL XL) 100 MG 24 hr tablet Take 1 tablet (100 mg total) by mouth daily. Take with or immediately following a meal. 30 tablet 11  . QUEtiapine (SEROQUEL) 200 MG tablet Take 100 mg by mouth at bedtime.    . traZODone (DESYREL) 100 MG tablet Take 100 mg by mouth at bedtime.    Marland Kitchen venlafaxine XR (EFFEXOR-XR) 75 MG 24 hr capsule Take 150 mg by mouth daily.    . VENTOLIN HFA 108 (90 Base) MCG/ACT inhaler INHALE 1 TO 2 PUFFS BY MOUTH EVERY 6 HOURS AS NEEDED FOR WHEEZING OR SHORTNESS OF BREATH 18 g 2   No current facility-administered medications for this visit.    Allergies:   Guaifenesin   Social History:  The patient  reports that she has never smoked. She has never used smokeless tobacco. She reports that she does not drink alcohol and does not use drugs.   Family History:  The patient's family history includes Atrial fibrillation in her sister and sister; Heart attack in her father; Hypertension in her father and mother; Psoriasis in her father.  ROS:  Please see the history of present illness.    All other systems are reviewed and otherwise negative.   PHYSICAL EXAM:  VS:  BP 112/72   Pulse 85   Ht 5' 5.5" (1.664 m)   Wt 151 lb 12.8 oz (68.9 kg)   SpO2 96%   BMI 24.88 kg/m  BMI: Body mass index is 24.88 kg/m. Well nourished, well developed, in no acute distress HEENT: normocephalic, atraumatic Neck: no JVD, carotid bruits or masses Cardiac:  RRR; no significant murmurs, no rubs, or gallops Lungs:  CTA b/l, no wheezing, rhonchi or rales Abd: soft, nontender MS: no deformity or atrophy Ext:  B/l groins are soft, no hematoma, no active bleeding.  She has a very small subcutaneous nodukle R groin (typical for the procedure), Dr. Quentin Ore with firm pressure able only to express a very slight ammount of serosanguinous fluid. no edema Skin: warm and dry, no rash Neuro:  No gross deficits appreciated Psych: euthymic mood, full affect    EKG:  Not  done today  10/26/20 EPS CONCLUSIONS:  1. Sinus rhythm upon presentation.  2.  Negative EP study 3. No early apparent complications.      07/25/2020: EPS CONCLUSIONS:  1. Sinus rhythm upon presentation.  2. No inducible arrhythmias 3. No early apparent complications   0/86/7619: EPS/ablation CONCLUSIONS: 1. Sinus rhythm upon presentation.   2. Successful electrical isolation and anatomical encircling of all four pulmonary veins with radiofrequency current. 3. No inducible arrhythmias following ablation both on and off of Isuprel 4. No early apparent complications.  03/15/2013: TTE Study Conclusions  - Left ventricle: The cavity size was normal. Wall thickness  was increased in a pattern of mild LVH.  Systolic function  was normal. The estimated ejection fraction was in the  range of 55% to 60%. Wall motion was normal; there were no  regional wall motion abnormalities. Left ventricular  diastolic function parameters were normal.  - Left atrium: The atrium was normal in size.  - Tricuspid valve: Poorly visualized. Trivial regurgitation.  - Pulmonary arteries: PA peak pressure: 61mm Hg (S).  - Inferior vena cava: The vessel was normal in size; the  respirophasic diameter changes were in the normal range (=  50%); findings are consistent with normal central venous  pressure.      Recent Labs: 10/24/2020: BUN 12; Creatinine, Ser 0.83; Hemoglobin 13.2; Platelets 333; Potassium 4.3; Sodium 146  No results found for requested labs within last 8760 hours.   Estimated Creatinine Clearance: 57.9 mL/min (by C-G formula based on SCr of 0.83 mg/dL).   Wt Readings from Last 3 Encounters:  11/06/20 151 lb 12.8 oz (68.9 kg)  10/26/20 150 lb (68 kg)  10/16/20 150 lb 3.2 oz (68.1 kg)     Other studies reviewed: Additional studies/records reviewed today include: summarized above  ASSESSMENT AND PLAN:  1. Paroxysmal Afib     CHA2DS2Vasc is 2 (age, gender)     S/p  ablation, off a/c           2. SVT     S/p 2 negative EP studies      Dr. Quentin Ore evaluated sites as well.  Veins are healed, she has no hematoma no bleeding There is no erythema, or heat to the tissues Only a very slight amount of serosanguinous fluid was noted with palpation and firm pressure. No overt signs of infection  Will Rx Keflex 50mg  BID for 5 days Pt instructed to keep clean and dry and avoid rubbing with clothing/undergarment   Disposition: will have her back next week to ensure resolution  Current medicines are reviewed at length with the patient today.  The patient did not have any concerns regarding medicines.  Venetia Night, PA-C 11/06/2020 9:33 AM     [address] [place] [address] [phone] (office)  [phone] (fax)

## 2020-11-05 NOTE — Telephone Encounter (Signed)
See 4/18 telephone note for further documentation on this matter

## 2020-11-05 NOTE — Telephone Encounter (Signed)
Please also see 4/15 telephone encounter on this matter. SVT Ablation 10/26/2020. Called pt to discuss. Reports continued drainage/bleeding from both groin sites.  States it is still drainage, this weekend it was mixed with blood.  States she is changing gauze BID. Pt advised that if site is bleeding then she should go to ED for evaluation, pt states occurred yesterday but not today and she wants to see Dr. [name]. Pt insistent on seeing Dr. [name] today and informed that we were not in the [facility] office today. Pt scheduled to see Tommye Standard, PA tomorrow for further evaluation. Patient verbalized understanding and agreeable to plan.

## 2020-11-05 NOTE — Telephone Encounter (Signed)
PT had an ablation and  sates she is bleeding no stop.She wants to be seen today.Per Marvel Plan will be calling the pt back in 15-20 mins.

## 2020-11-06 ENCOUNTER — Encounter: Payer: Self-pay | Admitting: Physician Assistant

## 2020-11-06 ENCOUNTER — Ambulatory Visit (INDEPENDENT_AMBULATORY_CARE_PROVIDER_SITE_OTHER): Payer: Medicare Other | Admitting: Physician Assistant

## 2020-11-06 ENCOUNTER — Other Ambulatory Visit: Payer: Self-pay

## 2020-11-06 VITALS — BP 112/72 | HR 85 | Ht 65.5 in | Wt 151.8 lb

## 2020-11-06 DIAGNOSIS — Z5189 Encounter for other specified aftercare: Secondary | ICD-10-CM | POA: Diagnosis not present

## 2020-11-06 DIAGNOSIS — I471 Supraventricular tachycardia: Secondary | ICD-10-CM | POA: Diagnosis not present

## 2020-11-06 MED ORDER — CEPHALEXIN 500 MG PO CAPS: 500.0000 mg | ORAL_CAPSULE | Freq: Two times a day (BID) | ORAL | 0 refills | Status: AC

## 2020-11-06 NOTE — Patient Instructions (Signed)
Medication Instructions:    FOR 5 DAYS ONLY  TAKE KEFLEX 500 MG  TWICE A DAY    *If you need a refill on your cardiac medications before your next appointment, please call your pharmacy*   Lab Work: NONE ORDERED  TODAY   If you have labs (blood work) drawn today and your tests are completely normal, you will receive your results only by: Marland Kitchen MyChart Message (if you have MyChart) OR . A paper copy in the mail If you have any lab test that is abnormal or we need to change your treatment, we will call you to review the results.   Testing/Procedures: NONE ORDERED  TODAY    Follow-Up: At [facility], you and your health needs are our priority.  As part of our continuing mission to provide you with exceptional heart care, we have created designated Provider Care Teams.  These Care Teams include your primary Cardiologist (physician) and Advanced Practice Providers (APPs -  Physician Assistants and Nurse Practitioners) who all work together to provide you with the care you need, when you need it.  We recommend signing up for the patient portal called "MyChart".  Sign up information is provided on this After Visit Summary.  MyChart is used to connect with patients for Virtual Visits (Telemedicine).  Patients are able to view lab/test results, encounter notes, upcoming appointments, etc.  Non-urgent messages can be sent to your provider as well.   To learn more about what you can do with MyChart, go to [email or web address].    Your next appointment:   1 week(s)  (CONTACT [facility] )  The format for your next appointment:   In Person  Provider:   You may see [name], MD or one of the following Advanced Practice Providers on your designated Care Team:     Legrand Como "Jonni Sanger" Chalmers Cater, [place]    Other Instructions

## 2020-11-09 ENCOUNTER — Other Ambulatory Visit: Payer: Self-pay

## 2020-11-09 ENCOUNTER — Encounter: Payer: Self-pay | Admitting: Internal Medicine

## 2020-11-14 ENCOUNTER — Ambulatory Visit: Payer: Medicare Other | Admitting: Student

## 2020-11-14 NOTE — Progress Notes (Deleted)
PCP:  Sid Falcon, MD Primary Cardiologist: No primary care provider on file. Electrophysiologist: [name], MD   Sheri [name] is a 70 y.o. female with h/o COPD, OA, Afib, SVT.  With increased SVT burden he underwent an EPS 07/25/20 unable to induce her tachycardia. Had her tachycardia in post op, discharged on Toprol. She saw Dr. [name] 08/23/20 in follow up and wanted to avoid the medication and asked to pursue another EPs and hopefully ablation.  She underwent EPS again 10/26/20 and again without inducible arrhythmias.   Pt called 4/18 reporting bilateral groin drainage/bleeding since procedure. Sites looked stable that day. Started on Keflex with continued slight drainage.   Pt is seen today for [name], MD for routine electrophysiology followup for post ablation groin check with drainage.    Since last week the patient reports doing ***.  she denies chest pain, palpitations, dyspnea, PND, orthopnea, nausea, vomiting, dizziness, syncope, edema, weight gain, or early satiety.  Past Medical History:  Diagnosis Date  . AF (atrial fibrillation) ([place]) 10/12/2015  . Allergic rhinitis   . Anxiety   . ANXIETY DISORDER, GENERALIZED 08/18/2006   Qualifier: Diagnosis of  By: [name] MD, [name]    . Arthritis    no per pt  . Atrial fibrillation with RVR ([place])   . Cancer ([place])    left lower eyelid, basal cell cancer  . Chiari I malformation ([place]) 03/07/2016   pt unsure of this  . Chronic sinusitis   . 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 COPD (chronic obstructive pulmonary disease) ([facility])    no per pt  . Depression    treated at [facility]  . GERD (gastroesophageal reflux disease)   . Goiter    hx of goiter  . Hx of atrial tachycardia since age 36 03/15/2013  . Memory loss 12/31/2016  . Mnire's disease 03/07/2016  . Palpitations    Associated with difficulty breathing  . Panic attacks   . Poisoning by selective serotonin reuptake  inhibitors(969.03) Jan 2007   severe reaction with MAOI (nardil) and dextromethorphan  . Postmenopausal 12/31/2016  . 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 Following with cardiology.   . Swollen gland 09/14/2018  . Tachycardia   . Tonsillitis 11/12/2018   Past Surgical History:  Procedure Laterality Date  . APPENDECTOMY    . COLONOSCOPY    . ELECTROPHYSIOLOGIC STUDY N/A 10/12/2015   Procedure: Atrial Fibrillation Ablation;  Surgeon: [name], MD;  Location: [place] [facility];  Service: Cardiovascular;  Laterality: N/A;  . LID LESION EXCISION Left 08/06/2017   Procedure: EXCISION BIOPSY OF EYELID, RECONSTRUCTION OF EYELID;  Surgeon: [name], MD;  Location: [facility];  Service: Ophthalmology;  Laterality: Left;  . OVARIAN CYST REMOVAL  09/1967   left ovarian cystectomy- 17.5 lb cyst removed- per pt  . right mastoidectomy     ET tube placement in 1980 and 1990  . right sinus surgery  october 2000  . SVT ABLATION N/A 07/25/2020   Procedure: SVT ABLATION;  Surgeon: [name], MD;  Location: [place] [facility];  Service: Cardiovascular;  Laterality: N/A;  . SVT ABLATION N/A 10/26/2020   Procedure: SVT ABLATION;  Surgeon: [name], MD;  Location: [facility];  Service: Cardiovascular;  Laterality: N/A;  . THYROIDECTOMY, PARTIAL     nodule removal - April 1996  Current Outpatient Medications  Medication Sig Dispense Refill  . meclizine (ANTIVERT) 25 MG tablet TAKE 1 TABLET BY MOUTH THREE TIMES DAILY AS NEEDED FOR DIZZINESS 20 tablet 0  . metoprolol succinate (TOPROL XL) 100 MG 24 hr tablet Take 1 tablet (100 mg total) by mouth daily. Take with or immediately following a meal. 30 tablet 11  . QUEtiapine (SEROQUEL) 200 MG tablet Take 100 mg by mouth at bedtime.    . traZODone (DESYREL) 100 MG tablet Take 100 mg by mouth at bedtime.    Marland Kitchen venlafaxine XR  (EFFEXOR-XR) 75 MG 24 hr capsule Take 150 mg by mouth daily.    . VENTOLIN HFA 108 (90 Base) MCG/ACT inhaler INHALE 1 TO 2 PUFFS BY MOUTH EVERY 6 HOURS AS NEEDED FOR WHEEZING OR SHORTNESS OF BREATH 18 g 2   No current facility-administered medications for this visit.    Allergies  Allergen Reactions  . Guaifenesin Palpitations    Social History   Socioeconomic History  . Marital status: Widowed    Spouse name: Not on file  . Number of children: Not on file  . Years of education: Not on file  . Highest education level: Not on file  Occupational History  . 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 Smoking status: Never Smoker  . Smokeless tobacco: Never Used  Vaping Use  . Vaping Use: Never used  Substance and Sexual Activity  . Alcohol use: No    Alcohol/week: 0.0 standard drinks  . Drug use: No  . Sexual activity: Not on file  Other Topics Concern  . Not on file  Social History Narrative   Lives alone.   Social Determinants of Health   Financial Resource Strain: Not on file  Food Insecurity: Not on file  Transportation Needs: Not on file  Physical Activity: Not on file  Stress: Not on file  Social Connections: Not on file  Intimate Partner Violence: Not on file     Review of Systems: General: No chills, fever, night sweats or weight changes  Cardiovascular:  No chest pain, dyspnea on exertion, edema, orthopnea, palpitations, paroxysmal nocturnal dyspnea Dermatological: No rash, lesions or masses Respiratory: No cough, dyspnea Urologic: No hematuria, dysuria Abdominal: No nausea, vomiting, diarrhea, bright red blood per rectum, melena, or hematemesis Neurologic: No visual changes, weakness, changes in mental status All other systems reviewed and are otherwise negative except as noted above.  Physical Exam: There were no vitals filed for this visit.  GEN- The patient is well appearing, alert and oriented x 3 today.   HEENT:  normocephalic, atraumatic; sclera clear, conjunctiva pink; hearing intact; oropharynx clear; neck supple, no JVP Lymph- no cervical lymphadenopathy Lungs- Clear to ausculation bilaterally, normal work of breathing.  No wheezes, rales, rhonchi Heart- Regular rate and rhythm, no murmurs, rubs or gallops, PMI not laterally displaced GI- soft, non-tender, non-distended, bowel sounds present, no hepatosplenomegaly Extremities- no clubbing, cyanosis, or edema; DP/PT/radial pulses 2+ bilaterally MS- no significant deformity or atrophy Skin- warm and dry, no rash or lesion Psych- euthymic mood, full affect Neuro- strength and sensation are intact  EKG is not ordered.   Additional studies reviewed include: Previous EP office notes  Assessment and Plan:   1. Paroxysmal Afib s/p ablation CHA2DS2Vasc is 2 (age, gender) so not on [place] per guidelines.          2. SVT S/p 2 negative EP studies 07/2020 and 10/2020 Groin sites ***  today.   She would prefer to keep appointment with Dr. [name] 5/9.  Shirley Friar, PA-C  11/14/20 10:55 AM

## 2020-11-26 ENCOUNTER — Ambulatory Visit: Payer: Medicare Other | Admitting: Cardiology

## 2020-11-29 ENCOUNTER — Encounter: Payer: Medicare Other | Admitting: Internal Medicine

## 2020-12-03 ENCOUNTER — Other Ambulatory Visit: Payer: Self-pay

## 2020-12-06 ENCOUNTER — Encounter: Payer: Medicare Other | Admitting: Internal Medicine

## 2020-12-11 ENCOUNTER — Encounter: Payer: Medicare Other | Admitting: Internal Medicine

## 2020-12-13 ENCOUNTER — Encounter: Payer: Medicare Other | Admitting: Internal Medicine

## 2020-12-14 ENCOUNTER — Encounter: Payer: Medicare Other | Admitting: Internal Medicine

## 2020-12-18 ENCOUNTER — Telehealth: Payer: Self-pay | Admitting: Cardiology

## 2020-12-18 NOTE — Telephone Encounter (Signed)
Pt c/o medication issue:  1. Name of Medication: Metoprolol  2. How are you currently taking this medication (dosage and times per day)? A 2 times a day   3. Are you having a reaction (difficulty breathing--STAT)? yes  4. What is your medication issue? Weakness, dizziness, blood pressure dropping, heart racing- pt stopped taking it and went back on Propanolol.

## 2020-12-19 NOTE — Telephone Encounter (Signed)
Left message to call back  

## 2020-12-27 ENCOUNTER — Encounter: Payer: Medicare Other | Admitting: Student

## 2021-01-01 ENCOUNTER — Encounter: Payer: Self-pay | Admitting: Internal Medicine

## 2021-01-01 ENCOUNTER — Other Ambulatory Visit: Payer: Self-pay | Admitting: Internal Medicine

## 2021-01-01 ENCOUNTER — Encounter: Payer: Medicare Other | Admitting: Internal Medicine

## 2021-01-01 DIAGNOSIS — E2839 Other primary ovarian failure: Secondary | ICD-10-CM

## 2021-01-04 ENCOUNTER — Other Ambulatory Visit: Payer: Medicare Other

## 2021-01-08 ENCOUNTER — Other Ambulatory Visit: Payer: Medicare Other

## 2021-01-13 NOTE — Progress Notes (Deleted)
Cardiology Office Note Date:  01/13/2021  Patient ID:  Sheri, [name] [birth year], MRN [id number] PCP:  Sid Falcon, MD  Cardiologist:  Dr. Debara Pickett Electrophysiologist: Dr. [name]     Chief Complaint:  *** ??  History of Present Illness: Sheri [name] is a 70 y.o. female with history of COPD, OA, Afib, SVT.  With increased SVT burden he underwent an EPS 07/25/20 unable to induce her tachycardia. Had her tachycardia in post op, discharged on Toprol. She saw Dr. [name] 08/23/20 in follow up and wanted to avoid the medication and asked to pursue another EPs and hopefully ablation.  She underwent EPS again 10/26/20 and again without inducible arrhythmias  She called reporting bleeding/drainage at both groins since her procedure, chaing gause dressing BID.  She was asking to see Dr. [name] though not in the office.  She reported no drainage up to that point yesterday and took an appt to be seen by me today.  I saw her 11/06/20 She noted a day or so after the procedure the gauze was saturated and removed them (both sides) since then has had a slight felt to be persistent drainage and bleeding from both sides. And has kept a piece of gauze or tissue there having to change timee 2x a day Not painful No symtoms of illness or fever No CP, no SVTs No SOB Sites looked stable, mild very superficial skin serosanguinous noted, also evaluated by Dr. Quentin Ore, agreed, no vascular bleeding RX Keflex for 5 days and planned to return in a week that she did not show to.  She called 12/18/20 reporting that the metoprolol made her feel poorly and had stopped this and resumed propanolol  *** symptoms, sites, palps, med ***    AFib Hx Diagnosed 2014 PVI ablation 10/12/2015   Past Medical History:  Diagnosis Date   AF (atrial fibrillation) ([place]) 10/12/2015   Allergic rhinitis    Anxiety    ANXIETY DISORDER, GENERALIZED 08/18/2006   Qualifier: Diagnosis of  By: [name] MD, [name]     Arthritis     no per pt   Atrial fibrillation with RVR ([place])    Cancer ([place])    left lower eyelid, basal cell cancer   Chiari I malformation ([place]) 03/07/2016   pt unsure of this   Chronic sinusitis    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   COPD (chronic obstructive pulmonary disease) ([place])    no per pt   Depression    treated at [facility]   GERD (gastroesophageal reflux disease)    Goiter    hx of goiter   Hx of atrial tachycardia since age 89 03/15/2013   Memory loss 12/31/2016   Mnire's disease 03/07/2016   Palpitations    Associated with difficulty breathing   Panic attacks    Poisoning by selective serotonin reuptake inhibitors(969.03) Jan 2007   severe reaction with MAOI (nardil) and dextromethorphan   Postmenopausal 12/31/2016   SVT (supraventricular tachycardia) ([place]) 03/15/2013   History of SVT s/p afib ablation, 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 Following with cardiology.    Swollen gland 09/14/2018   Tachycardia    Tonsillitis 11/12/2018    Past Surgical History:  Procedure Laterality Date   APPENDECTOMY     COLONOSCOPY     ELECTROPHYSIOLOGIC STUDY N/A 10/12/2015   Procedure: Atrial Fibrillation Ablation;  Surgeon: [name], MD;  Location: [facility];  Service: Cardiovascular;  Laterality: N/A;   LID LESION EXCISION  Left 08/06/2017   Procedure: EXCISION BIOPSY OF EYELID, RECONSTRUCTION OF EYELID;  Surgeon: [name], MD;  Location: [facility];  Service: Ophthalmology;  Laterality: Left;   OVARIAN CYST REMOVAL  09/1967   left ovarian cystectomy- 17.5 lb cyst removed- per pt   right mastoidectomy     ET tube placement in 1980 and 1990   right sinus surgery  october 2000   SVT ABLATION N/A 07/25/2020   Procedure: SVT ABLATION;  Surgeon: [name], MD;  Location: [facility];  Service: Cardiovascular;  Laterality: N/A;   SVT ABLATION N/A 10/26/2020   Procedure: SVT ABLATION;  Surgeon: [name], MD;   Location: [facility];  Service: Cardiovascular;  Laterality: N/A;   THYROIDECTOMY, PARTIAL     nodule removal - April 1996    Current Outpatient Medications  Medication Sig Dispense Refill   meclizine (ANTIVERT) 25 MG tablet TAKE 1 TABLET BY MOUTH THREE TIMES DAILY AS NEEDED FOR DIZZINESS 20 tablet 0   metoprolol succinate (TOPROL XL) 100 MG 24 hr tablet Take 1 tablet (100 mg total) by mouth daily. Take with or immediately following a meal. 30 tablet 11   QUEtiapine (SEROQUEL) 200 MG tablet Take 100 mg by mouth at bedtime.     traZODone (DESYREL) 100 MG tablet Take 100 mg by mouth at bedtime.     venlafaxine XR (EFFEXOR-XR) 75 MG 24 hr capsule Take 150 mg by mouth daily.     VENTOLIN HFA 108 (90 Base) MCG/ACT inhaler INHALE 1 TO 2 PUFFS BY MOUTH EVERY 6 HOURS AS NEEDED FOR WHEEZING OR SHORTNESS OF BREATH 18 g 2   No current facility-administered medications for this visit.    Allergies:   Guaifenesin   Social History:  The patient  reports that she has never smoked. She has never used smokeless tobacco. She reports that she does not drink alcohol and does not use drugs.   Family History:  The patient's family history includes Atrial fibrillation in her sister and sister; Heart attack in her father; Hypertension in her father and mother; Psoriasis in her father.  ROS:  Please see the history of present illness.    All other systems are reviewed and otherwise negative.   PHYSICAL EXAM:  VS:  There were no vitals taken for this visit. BMI: There is no height or weight on file to calculate BMI. Well nourished, well developed, in no acute distress HEENT: normocephalic, atraumatic Neck: no JVD, carotid bruits or masses Cardiac:  *** RRR; no significant murmurs, no rubs, or gallops Lungs:  *** CTA b/l, no wheezing, rhonchi or rales Abd: soft, nontender MS: no deformity or atrophy Ext:  *** B/l groins are soft, no hematoma, no active bleeding.  She has a very small subcutaneous  nodukle R groin (typical for the procedure), Dr. Quentin Ore with firm pressure able only to express a very slight ammount of serosanguinous fluid. no edema Skin: warm and dry, no rash Neuro:  No gross deficits appreciated Psych: euthymic mood, full affect    EKG:  Not done today  10/26/20 EPS CONCLUSIONS:  1. Sinus rhythm upon presentation.  2.  Negative EP study 3. No early apparent complications.      07/25/2020: EPS CONCLUSIONS:  1. Sinus rhythm upon presentation.  2. No inducible arrhythmias 3. No early apparent complications   01/11/7627: EPS/ablation CONCLUSIONS: 1. Sinus rhythm upon presentation.   2. Successful electrical isolation and anatomical encircling of all four pulmonary veins with  radiofrequency current. 3. No inducible arrhythmias following ablation both on and off of Isuprel 4. No early apparent complications.  03/15/2013: TTE Study Conclusions  - Left ventricle: The cavity size was normal. Wall thickness    was increased in a pattern of mild LVH. Systolic function    was normal. The estimated ejection fraction was in the    range of 55% to 60%. Wall motion was normal; there were no    regional wall motion abnormalities. Left ventricular    diastolic function parameters were normal.  - Left atrium: The atrium was normal in size.  - Tricuspid valve: Poorly visualized. Trivial regurgitation.  - Pulmonary arteries: PA peak pressure: 40mm Hg (S).  - Inferior vena cava: The vessel was normal in size; the    respirophasic diameter changes were in the normal range (=    50%); findings are consistent with normal central venous    pressure.      Recent Labs: 10/24/2020: BUN 12; Creatinine, Ser 0.83; Hemoglobin 13.2; Platelets 333; Potassium 4.3; Sodium 146  No results found for requested labs within last 8760 hours.   CrCl cannot be calculated (Patient's most recent lab result is older than the maximum 21 days allowed.).   Wt Readings from Last 3 Encounters:   11/06/20 151 lb 12.8 oz (68.9 kg)  10/26/20 150 lb (68 kg)  10/16/20 150 lb 3.2 oz (68.1 kg)     Other studies reviewed: Additional studies/records reviewed today include: summarized above  ASSESSMENT AND PLAN:  1. Paroxysmal Afib     CHA2DS2Vasc is 2 (age, gender)     S/p ablation, off a/c     ***           2. SVT     S/p 2 negative EP studies        Disposition: ***  Current medicines are reviewed at length with the patient today.  The patient did not have any concerns regarding medicines.  Venetia Night, PA-C 01/13/2021 10:12 AM     [address] [phone] (office)  [phone] (fax)

## 2021-01-15 ENCOUNTER — Ambulatory Visit: Payer: Medicare Other | Admitting: Physician Assistant

## 2021-01-23 ENCOUNTER — Other Ambulatory Visit: Payer: Self-pay | Admitting: Student

## 2021-01-23 DIAGNOSIS — H8109 Meniere's disease, unspecified ear: Secondary | ICD-10-CM

## 2021-01-31 ENCOUNTER — Ambulatory Visit (AMBULATORY_SURGERY_CENTER): Payer: Medicare Other

## 2021-01-31 ENCOUNTER — Ambulatory Visit
Admission: RE | Admit: 2021-01-31 | Discharge: 2021-01-31 | Disposition: A | Discharge status: Home / Self Care | Payer: Medicare Other | Source: Ambulatory Visit | Attending: Internal Medicine | Admitting: Internal Medicine

## 2021-01-31 ENCOUNTER — Other Ambulatory Visit: Payer: Self-pay | Admitting: Internal Medicine

## 2021-01-31 ENCOUNTER — Other Ambulatory Visit: Payer: Self-pay

## 2021-01-31 VITALS — Ht 65.5 in | Wt 146.0 lb

## 2021-01-31 DIAGNOSIS — N6452 Nipple discharge: Secondary | ICD-10-CM | POA: Diagnosis not present

## 2021-01-31 DIAGNOSIS — Z1211 Encounter for screening for malignant neoplasm of colon: Secondary | ICD-10-CM

## 2021-01-31 DIAGNOSIS — R922 Inconclusive mammogram: Secondary | ICD-10-CM | POA: Diagnosis not present

## 2021-01-31 DIAGNOSIS — N6002 Solitary cyst of left breast: Secondary | ICD-10-CM | POA: Diagnosis not present

## 2021-01-31 NOTE — Progress Notes (Signed)
Patient's pre-visit was done today over the phone with the patient  Name,DOB and address verified.    Patient denies any allergies to Eggs and Soy. Patient denies any problems with anesthesia/sedation. Patient denies taking diet pills or blood thinners. No home Oxygen. No chronic constipation  Packet of Prep instructions mailed to patient including a copy of a consent form-pt is aware. Patient understands to call us back with any questions or concerns. Patient is aware of our care-partner policy and WGNFA-21 safety protocol.

## 2021-02-07 ENCOUNTER — Other Ambulatory Visit: Payer: Self-pay | Admitting: Internal Medicine

## 2021-02-08 ENCOUNTER — Other Ambulatory Visit: Payer: Self-pay

## 2021-02-08 ENCOUNTER — Encounter: Payer: Self-pay | Admitting: Internal Medicine

## 2021-02-08 ENCOUNTER — Ambulatory Visit (AMBULATORY_SURGERY_CENTER): Payer: Medicare Other | Admitting: Internal Medicine

## 2021-02-08 VITALS — BP 109/66 | HR 68 | Temp 97.3°F | Resp 14 | Ht 65.0 in | Wt 146.0 lb

## 2021-02-08 DIAGNOSIS — Z1211 Encounter for screening for malignant neoplasm of colon: Secondary | ICD-10-CM

## 2021-02-08 DIAGNOSIS — Z86010 Personal history of colonic polyps: Secondary | ICD-10-CM | POA: Diagnosis not present

## 2021-02-08 MED ORDER — SODIUM CHLORIDE 0.9 % IV SOLN: 500.0000 mL | INTRAVENOUS | Status: DC

## 2021-02-08 NOTE — Progress Notes (Signed)
Report given to PACU, vss 

## 2021-02-08 NOTE — Telephone Encounter (Signed)
Next appt scheduled 03/29/21 with PCP.

## 2021-02-08 NOTE — Op Note (Addendum)
[facility] Patient Name: Sheri [name] Procedure Date: 02/08/2021 11:02 AM MRN: [id number] Endoscopist: Gatha Mayer , MD Age: 70 Referring MD:  Date of Birth: 03-20-1951 Gender: Female Account #: [id number] Procedure:                Colonoscopy Indications:              Screening for colorectal malignant neoplasm, Last                            colonoscopy: 2010 Medicines:                Propofol per Anesthesia, Monitored Anesthesia Care Procedure:                Pre-Anesthesia Assessment:                           - Prior to the procedure, a History and Physical                            was performed, and patient medications and                            allergies were reviewed. The patient's tolerance of                            previous anesthesia was also reviewed. The risks                            and benefits of the procedure and the sedation                            options and risks were discussed with the patient.                            All questions were answered, and informed consent                            was obtained. Prior Anticoagulants: The patient has                            taken no previous anticoagulant or antiplatelet                            agents. ASA Grade Assessment: III - A patient with                            severe systemic disease. After reviewing the risks                            and benefits, the patient was deemed in                            satisfactory condition to undergo the procedure.  After obtaining informed consent, the colonoscope                            was passed under direct vision. Throughout the                            procedure, the patient's blood pressure, pulse, and                            oxygen saturations were monitored continuously. The                            CF [id number] was introduced through the anus                            and advanced to  the the cecum, identified by                            appendiceal orifice and ileocecal valve. The                            quality of the bowel preparation was excellent. The                            colonoscopy was performed without difficulty. The                            patient tolerated the procedure well. The bowel                            preparation used was Miralax via split dose                            instruction. Scope In: 11:08:55 AM Scope Out: 11:20:19 AM Scope Withdrawal Time: 0 hours 7 minutes 35 seconds  Total Procedure Duration: 0 hours 11 minutes 24 seconds  Findings:                 The perianal and digital rectal examinations were                            normal.                           The colon (entire examined portion) appeared normal.                           No additional abnormalities were found on                            retroflexion. Complications:            No immediate complications. Estimated blood loss:                            None. Estimated Blood Loss:  Estimated blood loss: none. Impression:               - The entire examined colon is normal.                           - No specimens collected. Recommendation:           - Patient has a contact number available for                            emergencies. The signs and symptoms of potential                            delayed complications were discussed with the                            patient. Return to normal activities tomorrow.                            Written discharge instructions were provided to the                            patient.                           - Resume previous diet.                           - Continue present medications.                           - No repeat colonoscopy due to age and the absence                            of colonic polyps. SHE C/O FECAL INCONTINENCE IN                            RECOVERY - WE WILL ARRANGE AN OFFICE  VISIT Gatha Mayer, MD 02/08/2021 11:24:46 AM This report has been signed electronically.

## 2021-02-08 NOTE — Progress Notes (Signed)
Vs [place] I have reviewed the patient's medical history in detail and updated the computerized patient record.

## 2021-02-08 NOTE — Patient Instructions (Addendum)
This was a normal colonoscopy. No polyps or cancer were found.  I appreciate the opportunity to care for you. Gatha Mayer, MD, FACG   YOU HAD AN ENDOSCOPIC PROCEDURE TODAY AT [facility]:   Refer to the procedure report that was given to you for any specific questions about what was found during the examination.  If the procedure report does not answer your questions, please call your gastroenterologist to clarify.  If you requested that your care partner not be given the details of your procedure findings, then the procedure report has been included in a sealed envelope for you to review at your convenience later.  YOU SHOULD EXPECT: Some feelings of bloating in the abdomen. Passage of more gas than usual.  Walking can help get rid of the air that was put into your GI tract during the procedure and reduce the bloating. If you had a lower endoscopy (such as a colonoscopy or flexible sigmoidoscopy) you may notice spotting of blood in your stool or on the toilet paper. If you underwent a bowel prep for your procedure, you may not have a normal bowel movement for a few days.  Please Note:  You might notice some irritation and congestion in your nose or some drainage.  This is from the oxygen used during your procedure.  There is no need for concern and it should clear up in a day or so.  SYMPTOMS TO REPORT IMMEDIATELY:  Following lower endoscopy (colonoscopy or flexible sigmoidoscopy):  Excessive amounts of blood in the stool  Significant tenderness or worsening of abdominal pains  Swelling of the abdomen that is new, acute  Fever of 100F or higher   For urgent or emergent issues, a gastroenterologist can be reached at any hour by calling [phone]. Do not use MyChart messaging for urgent concerns.    DIET:  We do recommend a small meal at first, but then you may proceed to your regular diet.  Drink plenty of fluids but you should avoid alcoholic beverages for 24  hours.  ACTIVITY:  You should plan to take it easy for the rest of today and you should NOT DRIVE or use heavy machinery until tomorrow (because of the sedation medicines used during the test).    FOLLOW UP: Our staff will call the number listed on your records 48-72 hours following your procedure to check on you and address any questions or concerns that you may have regarding the information given to you following your procedure. If we do not reach you, we will leave a message.  We will attempt to reach you two times.  During this call, we will ask if you have developed any symptoms of COVID 19. If you develop any symptoms (ie: fever, flu-like symptoms, shortness of breath, cough etc.) before then, please call [phone].  If you test positive for Covid 19 in the 2 weeks post procedure, please call and report this information to [place].     SIGNATURES/CONFIDENTIALITY: You and/or your care partner have signed paperwork which will be entered into your electronic medical record.  These signatures attest to the fact that that the information above on your After Visit Summary has been reviewed and is understood.  Full responsibility of the confidentiality of this discharge information lies with you and/or your care-partner.

## 2021-02-11 ENCOUNTER — Telehealth: Payer: Self-pay

## 2021-02-11 NOTE — Telephone Encounter (Signed)
Received TC from pharmacist at [facility] who states Ventolin HFA not on her formulary.  Asking if MD can Send in a RX for ProAir which is covered (or do a PA for the Ventolin).  Will forward to PCP SChaplin, RN,BSN

## 2021-02-12 ENCOUNTER — Telehealth: Payer: Self-pay

## 2021-02-12 NOTE — Telephone Encounter (Signed)
  Follow up Call-  Call back number 02/08/2021  Post procedure Call Back phone  # [phone]  Permission to leave phone message Yes  Some recent data might be hidden     Patient questions:  Do you have a fever, pain , or abdominal swelling? No. Pain Score  0 *  Have you tolerated food without any problems? Yes.    Have you been able to return to your normal activities? Yes.    Do you have any questions about your discharge instructions: Diet   No. Medications  No. Follow up visit  No.  Do you have questions or concerns about your Care? No.  Actions: * If pain score is 4 or above: No action needed, pain <4.

## 2021-02-14 MED ORDER — ALBUTEROL SULFATE HFA 108 (90 BASE) MCG/ACT IN AERS: 2.0000 | INHALATION_SPRAY | Freq: Four times a day (QID) | RESPIRATORY_TRACT | 2 refills | Status: DC | PRN

## 2021-02-14 NOTE — Telephone Encounter (Signed)
Done. Herbie Drape yoU!

## 2021-02-15 ENCOUNTER — Other Ambulatory Visit: Payer: Self-pay | Admitting: Internal Medicine

## 2021-02-15 ENCOUNTER — Ambulatory Visit
Admission: RE | Admit: 2021-02-15 | Discharge: 2021-02-15 | Disposition: A | Discharge status: Home / Self Care | Payer: Medicare Other | Source: Ambulatory Visit | Attending: Internal Medicine | Admitting: Internal Medicine

## 2021-02-15 ENCOUNTER — Other Ambulatory Visit: Payer: Self-pay

## 2021-02-15 DIAGNOSIS — M810 Age-related osteoporosis without current pathological fracture: Secondary | ICD-10-CM | POA: Diagnosis not present

## 2021-02-15 DIAGNOSIS — Z780 Asymptomatic menopausal state: Secondary | ICD-10-CM | POA: Diagnosis not present

## 2021-02-15 DIAGNOSIS — M8588 Other specified disorders of bone density and structure, other site: Secondary | ICD-10-CM | POA: Diagnosis not present

## 2021-02-15 DIAGNOSIS — E2839 Other primary ovarian failure: Secondary | ICD-10-CM

## 2021-02-15 DIAGNOSIS — H8109 Meniere's disease, unspecified ear: Secondary | ICD-10-CM

## 2021-03-01 ENCOUNTER — Other Ambulatory Visit: Payer: Self-pay | Admitting: Internal Medicine

## 2021-03-01 DIAGNOSIS — H8109 Meniere's disease, unspecified ear: Secondary | ICD-10-CM

## 2021-03-07 ENCOUNTER — Telehealth: Payer: Self-pay | Admitting: Internal Medicine

## 2021-03-07 ENCOUNTER — Ambulatory Visit (INDEPENDENT_AMBULATORY_CARE_PROVIDER_SITE_OTHER): Payer: Medicare Other | Admitting: Internal Medicine

## 2021-03-07 DIAGNOSIS — K529 Noninfective gastroenteritis and colitis, unspecified: Secondary | ICD-10-CM | POA: Diagnosis not present

## 2021-03-07 DIAGNOSIS — R197 Diarrhea, unspecified: Secondary | ICD-10-CM | POA: Diagnosis not present

## 2021-03-07 NOTE — Telephone Encounter (Signed)
Patient requesting a call back .  Pt having diarrhea times 1 week.

## 2021-03-07 NOTE — Addendum Note (Signed)
Addended by: Truddie Crumble on: 03/07/2021 04:31 PM   Modules accepted: Orders

## 2021-03-07 NOTE — Telephone Encounter (Addendum)
Rec'd call from the pt asking to speak with Triage.  Unable to  transfer call to the triage Line busy, but was instructed earlier per Lauren D. RN,  if the pt calls back to please add her on for a [place] appt this afternoon to f/u with her diarrhea.   Relayed message pt became upset and angry as to why she had to have an appt and why Dr. 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

## 2021-03-07 NOTE — Telephone Encounter (Signed)
Returned call to patient. No answer. Left message on VM requesting return call.  °

## 2021-03-07 NOTE — Assessment & Plan Note (Signed)
Patient is experiencing one week of multiple non-bloody watery stools each day (11-12 per day) with minimal relief from over-the-counter medication Imodium. Differential for etiologies include infectious, inflammatory, malabsorption, psychosomatic. She does have a history of toxigenic C. Diff in January 2022 but has been symptom free between treatment at that time and now. She denies recent travel or possible consumption of untreated water. Colonoscopy in July 2022 was normal. She denies episodes of blood in the stool as well. If she were experiencing diarrhea due to malabsorption or psychogenic such as IBS-D I would not expect her symptoms to be this acute and rapid in onset.  Plan: Stool culture and C. Diff antigen ordered. Patient confirms that she is en route to [facility] at this time to pick up collection kit and will have a family member bring it back to the lab before it closes at noon tomorrow. In the meantime I have counseled her that she can try taking two Imodium tablets instead of 1 for symptomatic relief. Once the stool sample results are released I will update the patient on any management changes that need to be made.

## 2021-03-07 NOTE — Telephone Encounter (Signed)
Patient returned call from work. States she gets off at 1500. Telehealth appt made for today at 1530 with [facility].

## 2021-03-07 NOTE — Progress Notes (Signed)
[facility] Telephone Encounter Continuity Care Appointment  HPI:  This telephone encounter was created for [name] on 03/07/2021 for the following purpose/cc: diarrhea. The patient states that for the last week or so she has been having 11-12 episodes of loose, non-bloody, watery stools per day. She has been taking imodium 1 tablet for her symptoms but only feeling relief for a few hours before symptoms return. She states that she has worn a diaper to bed out of fear of nighttime stool incontinence and has had some near-accidents during the daytime. She sometimes will sit on the toilet due to urge but is unable to pass stool. She has not had any recent travel and no swimming in lakes or rivers. She has not had recent hospitalization or antibiotic use. She is able to keep food and water down and does not feel dehydrated. There is no fever, chills, or abdominal pain. She was treated for toxigenic C. Diff in January and says that she was symptom free between treatment and the onset of this episode. This time is different because it is lasting longer, she says. Others in her home do have similar symptoms though she says that they are more mild in nature.   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   COPD (chronic obstructive pulmonary disease) ([place])    no per pt   Depression    treated at [facility]   GERD (gastroesophageal reflux disease)    Goiter    hx of goiter   Hx of atrial tachycardia since age 31 03/15/2013    Memory loss 12/31/2016   Mnire's disease 03/07/2016   Palpitations    Associated with difficulty breathing   Panic attacks    Poisoning by selective serotonin reuptake inhibitors(969.03) Jan 2007   severe reaction with MAOI (nardil) and dextromethorphan   Postmenopausal 12/31/2016   SVT (supraventricular tachycardia) ([place]) 03/15/2013   History of SVT s/p afib ablation, 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 Following with cardiology.    Swollen gland 09/14/2018   Tachycardia    Tonsillitis 11/12/2018     ROS:  Review of Systems  Constitutional:  Negative for chills and fever.  Gastrointestinal:  Positive for diarrhea. Negative for abdominal pain, blood in stool, nausea and vomiting.  Neurological:  Negative for dizziness, loss of consciousness, weakness and headaches.    Assessment / Plan / Recommendations:  Please see A&P under problem oriented charting for assessment of the patient's acute and chronic medical conditions.  As always, pt is advised that if symptoms worsen or new symptoms arise, they should go to an urgent care facility or to to ER for further evaluation.   Consent and Medical Decision Making:  Patient discussed with Dr. [name] This is a telephone encounter between Sheri [name] and Farrel Gordon on 03/07/2021 for diarrhea. The visit was conducted with the patient located at home and Farrel Gordon at [facility]. The patient's identity  was confirmed using their DOB and current address. The patient has consented to being evaluated through a telephone encounter and understands the associated risks (an examination cannot be done and the patient may need to come in for an appointment) / benefits (allows the patient to remain at home, decreasing exposure to coronavirus). I personally spent 30 minutes on medical discussion.

## 2021-03-08 ENCOUNTER — Other Ambulatory Visit: Payer: Medicare Other

## 2021-03-08 DIAGNOSIS — R197 Diarrhea, unspecified: Secondary | ICD-10-CM

## 2021-03-08 NOTE — Addendum Note (Signed)
Addended by: Truddie Crumble on: 03/08/2021 11:00 AM   Modules accepted: Orders

## 2021-03-08 NOTE — Addendum Note (Signed)
Addended by: Truddie Crumble on: 03/08/2021 11:35 AM   Modules accepted: Orders

## 2021-03-08 NOTE — Addendum Note (Signed)
Addended by: Renato Battles on: 03/08/2021 10:10 AM   Modules accepted: Orders

## 2021-03-08 NOTE — Progress Notes (Signed)
Internal Medicine Clinic Attending  Case discussed with Dr. Marlou Sa  At the time of the visit. I was present during the phone conversation with this patient. We reviewed the resident's history and exam and pertinent patient test results.  I agree with the assessment, diagnosis, and plan of care documented in the resident's note.

## 2021-03-08 NOTE — Addendum Note (Signed)
Addended by: Renato Battles on: 03/08/2021 11:08 AM   Modules accepted: Level of Service

## 2021-03-09 LAB — STOOL CULTURE

## 2021-03-10 LAB — CLOSTRIDIUM DIFFICILE EIA: C difficile Toxins A+B, EIA: NEGATIVE

## 2021-03-11 ENCOUNTER — Telehealth: Payer: Self-pay | Admitting: Internal Medicine

## 2021-03-11 DIAGNOSIS — R197 Diarrhea, unspecified: Secondary | ICD-10-CM

## 2021-03-11 LAB — GI PROFILE, STOOL, PCR

## 2021-03-11 NOTE — Telephone Encounter (Signed)
Spoke with patient to let her know infectious work-up of her diarrhea came back negative. She was upset because she is still having diarrhea. I told her that I felt a referral for GI would be appropriate and she became unhappy because she doesn't have an established GI doctor and wants to see Dr. Daryll Drown. I explained to her that I would be happy to place the referral at this time and explained that I am not in a position to discuss Dr. Doristine Section where abouts or why she has not been in the office to see her recently.

## 2021-03-13 ENCOUNTER — Telehealth: Payer: Self-pay

## 2021-03-13 NOTE — Telephone Encounter (Signed)
Pt called requesting to speak with Dr. Marlou Sa regarding referral to GI. Told pt the appt with [facility] has been scheduled, pt became angry and unhappy with the appt. She kept on yelling and stating she needs to speak with Dr. Marlou Sa right away. Pt became upset and hung up the phone.

## 2021-03-19 ENCOUNTER — Other Ambulatory Visit (INDEPENDENT_AMBULATORY_CARE_PROVIDER_SITE_OTHER): Payer: Medicare Other

## 2021-03-19 ENCOUNTER — Ambulatory Visit (INDEPENDENT_AMBULATORY_CARE_PROVIDER_SITE_OTHER): Payer: Medicare Other | Admitting: Gastroenterology

## 2021-03-19 ENCOUNTER — Other Ambulatory Visit: Payer: Self-pay

## 2021-03-19 ENCOUNTER — Encounter: Payer: Self-pay | Admitting: Gastroenterology

## 2021-03-19 VITALS — BP 110/58 | HR 73 | Ht 65.0 in | Wt 150.4 lb

## 2021-03-19 DIAGNOSIS — R197 Diarrhea, unspecified: Secondary | ICD-10-CM | POA: Diagnosis not present

## 2021-03-19 LAB — CBC
HCT: 38.1 % (ref 36.0–46.0)
Hemoglobin: 12.6 g/dL (ref 12.0–15.0)
MCHC: 33.1 g/dL (ref 30.0–36.0)
MCV: 88.4 fl (ref 78.0–100.0)
Platelets: 362 10*3/uL (ref 150.0–400.0)
RBC: 4.31 Mil/uL (ref 3.87–5.11)
RDW: 12.9 % (ref 11.5–15.5)
WBC: 8.2 10*3/uL (ref 4.0–10.5)

## 2021-03-19 LAB — BASIC METABOLIC PANEL
BUN: 9 mg/dL (ref 6–23)
CO2: 28 mEq/L (ref 19–32)
Calcium: 8.8 mg/dL (ref 8.4–10.5)
Chloride: 108 mEq/L (ref 96–112)
Creatinine, Ser: 0.73 mg/dL (ref 0.40–1.20)
GFR: 83.19 mL/min (ref >60.00)
Glucose, Bld: 97 mg/dL (ref 70–99)
Potassium: 2.7 mEq/L — CL (ref 3.5–5.1)
Sodium: 143 mEq/L (ref 135–145)

## 2021-03-19 LAB — TSH: TSH: 1.78 u[IU]/mL (ref 0.35–5.50)

## 2021-03-19 LAB — C-REACTIVE PROTEIN: CRP: <1 mg/dL (ref 0.5–20.0)

## 2021-03-19 LAB — SEDIMENTATION RATE: Sed Rate: 14 mm/hr (ref 0–30)

## 2021-03-19 MED ORDER — POTASSIUM CHLORIDE CRYS ER 20 MEQ PO TBCR: 40.0000 meq | EXTENDED_RELEASE_TABLET | Freq: Once | ORAL | 0 refills | Status: DC

## 2021-03-19 MED ORDER — DIPHENOXYLATE-ATROPINE 2.5-0.025 MG PO TABS: ORAL_TABLET | ORAL | 1 refills | Status: DC

## 2021-03-19 NOTE — Patient Instructions (Addendum)
If you are age 70 or older, your body mass index should be between 23-30. Your Body mass index is 25.02 kg/m. If this is out of the aforementioned range listed, please consider follow up with your Primary Care Provider. __________________________________________________________  The [place] [facility] providers would like to encourage you to use [facility] to communicate with providers for non-urgent requests or questions.  Due to long hold times on the telephone, sending your provider a message by [facility] may be a faster and more efficient way to get a response.  Please allow 48 business hours for a response.  Please remember that this is for non-urgent requests.   Your provider has requested that you go to the basement level for lab work before leaving today. Press "B" on the elevator. The lab is located at the first door on the left as you exit the elevator.  Due to recent changes in healthcare laws, you may see the results of your imaging and laboratory studies on MyChart before your provider has had a chance to review them.  We understand that in some cases there may be results that are confusing or concerning to you. Not all laboratory results come back in the same time frame and the provider may be waiting for multiple results in order to interpret others.  Please give [place] 48 hours in order for your provider to thoroughly review all the results before contacting the office for clarification of your results.   Please purchase the following medications over the counter and take as directed:  START: Florastor or Align probiotic daily.  Please call back to our office or send a MyChart message to [facility] or [place] in 10 - 14 days to report on how you are feeling.  We have sent the following medications to your pharmacy for you to pick up at your convenience:  START: Lomotil take 1 to 2 tablets every 4 to 6 hours as needed for diarrhea.  Thank you for entrusting me with your care and choosing [facility].  Alonza Bogus, PA-C

## 2021-03-19 NOTE — Progress Notes (Signed)
03/19/2021 Sheri [name] [id number] 1951/03/01   HISTORY OF PRESENT ILLNESS: This is a 70 year old female who is a patient of Dr. [name], known to him only for colonoscopy, which she just had performed in July.  The study was completely normal.  She is here today at the request of her PCP, Dr. [name], for evaluation of diarrhea.  Patient tells me that for a couple of weeks following her colonoscopy things were normal, was having normal bowel movements.  Then about 3.5 weeks ago she started having diarrhea.  She describes this is about 4-6 times a day.  No nocturnal stools.  No blood in the stool.  No abdominal pain, but describes pressure in her lower back/sacral area.  She says that initially Imodium A-D seem to work, but does not seem to be working anymore.  She followed a brat diet and that really did not help.  She denies any new medications.  She said no changes in her diet and no recent travel.  There is no associated nausea and vomiting.  Her PCP performed stool studies as she has a history of C. difficile, but those were negative.  She tells me that she has had episodes of incontinence.  Denies any similar issues in the past except for when she had C. difficile.   Past Medical History:  Diagnosis Date   AF (atrial fibrillation) ([place]) 10/12/2015   Allergic rhinitis    Anxiety    ANXIETY DISORDER, GENERALIZED 08/18/2006   Qualifier: Diagnosis of  By: [name] MD, [name]     Arthritis    no per pt   Atrial fibrillation with RVR ([place])    Cancer ([place])    left lower eyelid, basal cell cancer   Chiari I malformation ([place]) 03/07/2016   pt unsure of this   Chronic sinusitis    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   COPD (chronic obstructive pulmonary disease) ([place])    no per pt   Depression    treated at [facility]   GERD (gastroesophageal reflux disease)    Goiter    hx of goiter   Hx of atrial tachycardia since age 37 03/15/2013   Memory loss 12/31/2016    Mnire's disease 03/07/2016   Palpitations    Associated with difficulty breathing   Panic attacks    Poisoning by selective serotonin reuptake inhibitors(969.03) Jan 2007   severe reaction with MAOI (nardil) and dextromethorphan   Postmenopausal 12/31/2016   SVT (supraventricular tachycardia) ([place]) 03/15/2013   History of SVT s/p afib ablation, 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 Following with cardiology.    Swollen gland 09/14/2018   Tachycardia    Tonsillitis 11/12/2018   Past Surgical History:  Procedure Laterality Date   APPENDECTOMY     COLONOSCOPY     ELECTROPHYSIOLOGIC STUDY N/A 10/12/2015   Procedure: Atrial Fibrillation Ablation;  Surgeon: [name], MD;  Location: [place] [facility];  Service: Cardiovascular;  Laterality: N/A;   LID LESION EXCISION Left 08/06/2017   Procedure: EXCISION BIOPSY OF EYELID, RECONSTRUCTION OF EYELID;  Surgeon: [name], MD;  Location: [place];  Service: Ophthalmology;  Laterality: Left;   OVARIAN CYST REMOVAL  09/1967   left ovarian cystectomy- 17.5 lb cyst removed- per pt   right mastoidectomy     ET tube placement in 1980 and 1990   right sinus surgery  october 2000   SVT ABLATION N/A 07/25/2020   Procedure: SVT ABLATION;  Surgeon: [name], Will  Hassell Done, MD;  Location: [place] [facility];  Service: Cardiovascular;  Laterality: N/A;   SVT ABLATION N/A 10/26/2020   Procedure: SVT ABLATION;  Surgeon: [name], MD;  Location: [place] [facility];  Service: Cardiovascular;  Laterality: N/A;   THYROIDECTOMY, PARTIAL     nodule removal - April 1996    reports that she has never smoked. She has never used smokeless tobacco. She reports that she does not drink alcohol and does not use drugs. family history includes Atrial fibrillation in her sister and sister; Breast cancer (age of onset: 71) in her niece; Heart attack in her father; Hypertension in her father and mother; Psoriasis in her father. Allergies  Allergen  Reactions   Guaifenesin Palpitations      Outpatient Encounter Medications as of 03/19/2021  Medication Sig   albuterol (VENTOLIN HFA) 108 (90 Base) MCG/ACT inhaler Inhale 2 puffs into the lungs every 6 (six) hours as needed for wheezing or shortness of breath.   meclizine (ANTIVERT) 25 MG tablet TAKE 1 TABLET BY MOUTH THREE TIMES DAILY AS NEEDED FOR DIZZINESS   propranolol (INDERAL) 10 MG tablet Take 10 mg by mouth 2 (two) times daily.   QUEtiapine (SEROQUEL) 200 MG tablet Take 100 mg by mouth at bedtime.   traZODone (DESYREL) 100 MG tablet Take 100 mg by mouth at bedtime.   venlafaxine XR (EFFEXOR-XR) 75 MG 24 hr capsule Take 150 mg by mouth daily.   No facility-administered encounter medications on file as of 03/19/2021.    REVIEW OF SYSTEMS  : All other systems reviewed and negative except where noted in the History of Present Illness.   PHYSICAL EXAM: BP (!) 110/58   Pulse 73   Ht '5\' 5"'$  (1.651 m)   Wt 150 lb 6 oz (68.2 kg)   BMI 25.02 kg/m  General: Well developed white female in no acute distress Head: Normocephalic and atraumatic Eyes:  Sclerae anicteric, conjunctiva pink. Ears: Normal auditory acuity Lungs: Clear throughout to auscultation; no W/R/R. Heart: Regular rate and rhythm; no M/R/G. Abdomen: Soft, non-distended.  BS present.  Non-tender. Musculoskeletal: Symmetrical with no gross deformities  Skin: No lesions on visible extremities Extremities: No edema  Neurological: Alert oriented x 4, grossly non-focal Psychological:  Alert and cooperative. Normal mood and affect  ASSESSMENT AND PLAN: *Diarrhea:  States that this began a couple of weeks after her colonoscopy, stools were normal for the first couple of weeks following that.  Stool studies negative.  No new meds, change in diet, travel.  Imodium helped initially but not really anymore.  Has never really been seen here for complaints of diarrhea in the past and no history of IBS listed in her chart.  Really not  sure what is causing this.  I wonder if she had a disrupt of her normal gut bacteria from the colonoscopy and prep versus some type of infectious source with some postinfectious IBS.  I would like her to begin probiotic daily in the form of Florastor or align.  We will send prescription for Lomotil for her to use.  Would like to see how things go with this regimen over the next 10 to 14 days.  She will call back for follow-up at that point to give [place] an update.  She just had a colonoscopy, but if it continues then I wonder if we would need to do flexible sigmoidoscopy to take some biopsies.  I am going to check a CBC, BMP, TSH, sed rate, CRP, and  celiac labs today as well.   CC:  Sid Falcon, MD CC:  Dr. [name]

## 2021-03-20 LAB — TISSUE TRANSGLUTAMINASE, IGA: (tTG) Ab, IgA: <1 U/mL

## 2021-03-20 LAB — IGA: Immunoglobulin A: 344 mg/dL — ABNORMAL HIGH (ref 70–320)

## 2021-03-21 ENCOUNTER — Encounter: Payer: Self-pay | Admitting: Internal Medicine

## 2021-03-21 ENCOUNTER — Other Ambulatory Visit (INDEPENDENT_AMBULATORY_CARE_PROVIDER_SITE_OTHER): Payer: Medicare Other

## 2021-03-21 DIAGNOSIS — R197 Diarrhea, unspecified: Secondary | ICD-10-CM | POA: Diagnosis not present

## 2021-03-21 LAB — BASIC METABOLIC PANEL
BUN: 9 mg/dL (ref 6–23)
CO2: 28 mEq/L (ref 19–32)
Calcium: 8.7 mg/dL (ref 8.4–10.5)
Chloride: 107 mEq/L (ref 96–112)
Creatinine, Ser: 0.72 mg/dL (ref 0.40–1.20)
GFR: 84.58 mL/min (ref >60.00)
Glucose, Bld: 91 mg/dL (ref 70–99)
Potassium: 3.1 mEq/L — ABNORMAL LOW (ref 3.5–5.1)
Sodium: 142 mEq/L (ref 135–145)

## 2021-03-22 ENCOUNTER — Other Ambulatory Visit: Payer: Self-pay

## 2021-03-22 DIAGNOSIS — E876 Hypokalemia: Secondary | ICD-10-CM

## 2021-03-22 MED ORDER — POTASSIUM CHLORIDE CRYS ER 20 MEQ PO TBCR: 40.0000 meq | EXTENDED_RELEASE_TABLET | Freq: Every day | ORAL | 0 refills | Status: DC

## 2021-03-29 ENCOUNTER — Other Ambulatory Visit (INDEPENDENT_AMBULATORY_CARE_PROVIDER_SITE_OTHER): Payer: Medicare Other

## 2021-03-29 ENCOUNTER — Ambulatory Visit (INDEPENDENT_AMBULATORY_CARE_PROVIDER_SITE_OTHER): Payer: Medicare Other | Admitting: Internal Medicine

## 2021-03-29 ENCOUNTER — Other Ambulatory Visit: Payer: Self-pay

## 2021-03-29 ENCOUNTER — Other Ambulatory Visit: Payer: Self-pay | Admitting: Gastroenterology

## 2021-03-29 ENCOUNTER — Telehealth: Payer: Self-pay | Admitting: Internal Medicine

## 2021-03-29 VITALS — BP 97/52 | HR 84 | Temp 98.5°F | Ht 65.5 in | Wt 150.5 lb

## 2021-03-29 DIAGNOSIS — Z0000 Encounter for general adult medical examination without abnormal findings: Secondary | ICD-10-CM

## 2021-03-29 DIAGNOSIS — M810 Age-related osteoporosis without current pathological fracture: Secondary | ICD-10-CM | POA: Diagnosis not present

## 2021-03-29 DIAGNOSIS — E876 Hypokalemia: Secondary | ICD-10-CM

## 2021-03-29 DIAGNOSIS — I480 Paroxysmal atrial fibrillation: Secondary | ICD-10-CM | POA: Diagnosis not present

## 2021-03-29 DIAGNOSIS — F3341 Major depressive disorder, recurrent, in partial remission: Secondary | ICD-10-CM

## 2021-03-29 DIAGNOSIS — R197 Diarrhea, unspecified: Secondary | ICD-10-CM | POA: Diagnosis not present

## 2021-03-29 DIAGNOSIS — Z23 Encounter for immunization: Secondary | ICD-10-CM

## 2021-03-29 DIAGNOSIS — I471 Supraventricular tachycardia: Secondary | ICD-10-CM | POA: Diagnosis not present

## 2021-03-29 LAB — BASIC METABOLIC PANEL
BUN: 8 mg/dL (ref 6–23)
CO2: 27 mEq/L (ref 19–32)
Calcium: 8.5 mg/dL (ref 8.4–10.5)
Chloride: 106 mEq/L (ref 96–112)
Creatinine, Ser: 0.76 mg/dL (ref 0.40–1.20)
GFR: 79.25 mL/min (ref >60.00)
Glucose, Bld: 77 mg/dL (ref 70–99)
Potassium: 3.4 mEq/L — ABNORMAL LOW (ref 3.5–5.1)
Sodium: 139 mEq/L (ref 135–145)

## 2021-03-29 MED ORDER — ALENDRONATE SODIUM 70 MG PO TABS: 70.0000 mg | ORAL_TABLET | ORAL | 11 refills | Status: DC

## 2021-03-29 NOTE — Patient Instructions (Addendum)
Sheri [name] - -  Based on your DEXA (bone density) scan, you now have osteoporosis.   Please see more information about this diagnosis below.  Please start taking Alendronate weekly.  You will need to take this sitting up for 30 minutes and with a  full glass of water.    Please come back to see me in 3-4 months, sooner if needed.    For your sore throat, congestion, please call the clinic if you develop fever, chills, lightheadness, worsening symptoms.  Please check yourself for COVID using an over the counter test.   Thank you!  Osteoporosis Osteoporosis is when the bones get thin and weak. This can cause your bones to break (fracture) more easily. What are the causes? The exact cause of this condition is not known. What increases the risk? Having family members with this condition. Not eating enough healthy foods. Taking certain medicines. Being female. Being age 70 or older. Smoking or using other products that contain nicotine or tobacco, such as e-cigarettes or chewing tobacco. Not exercising. Being of European or Asian ancestry. Having a small body frame. What are the signs or symptoms? A broken bone might be the first sign, especially if the break results from a fall or injury that usually would not cause a bone to break. Other signs and symptoms include: Pain in the neck or low back. Being hunched over (stooped posture). Getting shorter. How is this treated? Eating more foods with more calcium and vitamin D in them. Doing exercises. Stopping tobacco use. Limiting how much alcohol you drink. Taking medicines to slow bone loss or help make the bones stronger. Taking supplements of calcium and vitamin D every day. Taking medicines to replace chemicals in the body (hormone replacement medicines). Monitoring your levels of calcium and vitamin D. The goal of treatment is to strengthen your bones and lower your risk for a bone break. Follow these instructions at home: Eating  and drinking Eat plenty of calcium and vitamin D. These nutrients are good for your bones. Good sources of calcium and vitamin D include: Some fish, such as salmon and tuna. Foods that have calcium and vitamin D added to them (fortified foods), such as some breakfast cereals. Egg yolks. Cheese. Liver.  Activity Do exercises as told by your doctor. Ask your doctor what exercises are safe for you. You should do: Exercises that make your muscles work to hold your body weight up (weight-bearing exercises). These include tai chi, yoga, and walking. Exercises to make your muscles stronger. One example is lifting weights. Lifestyle Do not drink alcohol if: Your doctor tells you not to drink. You are pregnant, may be pregnant, or are planning to become pregnant. If you drink alcohol: Limit how much you use to: 0-1 drink a day for women. 0-2 drinks a day for men. Know how much alcohol is in your drink. In the [place] one drink equals one 12 oz bottle of beer (355 mL), one 5 oz glass of wine (148 mL), or one 1 oz glass of hard liquor (44 mL). Do not smoke or use any products that contain nicotine or tobacco. If you need help quitting, ask your doctor. Preventing falls Use tools to help you move around (mobility aids) as needed. These include canes, walkers, scooters, and crutches. Keep rooms well-lit. Put away things on the floor that could make you trip. These include cords and rugs. Install safety rails on stairs. Install grab bars in bathrooms. Use rubber mats in slippery areas,  like bathrooms. Wear shoes that: Fit you well. Support your feet. Have closed toes. Have rubber soles or low heels. Tell your doctor about all of the medicines you are taking. Some medicines can make you more likely to fall. General instructions Take over-the-counter and prescription medicines only as told by your doctor. Keep all follow-up visits. Contact a doctor if: You have not been tested (screened) for  osteoporosis and you are: A woman who is age 47 or older. A man who is age 32 or older. Get help right away if: You fall. You get hurt. Summary Osteoporosis happens when your bones get thin and weak. Weak bones can break (fracture) more easily. Eat plenty of calcium and vitamin D. These are good for your bones. Tell your doctor about all of the medicines that you take. This information is not intended to replace advice given to you by your health care provider. Make sure you discuss any questions you have with your health care provider. Document Revised: 12/22/2019 Document Reviewed: 12/22/2019 Elsevier Patient Education  [place].  Alendronate Tablets What is this medication? ALENDRONATE (a LEN droe nate) prevents and treats osteoporosis. It may also be used to treat Paget disease of the bone. It works by Paramedic stronger and less likely to break (fracture). It belongs to a group of medications called bisphosphonates. This medicine may be used for other purposes; ask your health care provider or pharmacist if you have questions. COMMON BRAND NAME(S): Fosamax What should I tell my care team before I take this medication? They need to know if you have any of these conditions: Bleeding disorder Cancer Dental disease Difficulty swallowing Infection (fever, chills, cough, sore throat, pain or trouble passing urine) Kidney disease Low levels of calcium or other minerals in the blood Low red blood cell counts Receiving steroids like dexamethasone or prednisone Stomach or intestine problems Trouble sitting or standing for 30 minutes An unusual or allergic reaction to alendronate, other medications, foods, dyes or preservatives Pregnant or trying to get pregnant Breast-feeding How should I use this medication? Take this medication by mouth with a full glass of water. Take it as directed on the prescription label at the same time every day. Take the dose right after  waking up. Do not eat or drink anything before taking it. Do not take it with any other drink except water. Do not chew or crush the tablet. After taking it, do not eat breakfast, drink, or take any other medications or vitamins for at least 30 minutes. Sit or stand up for at least 30 minutes after you take it. Do not lie down. Keep taking it unless your care team tells you to stop. A special MedGuide will be given to you by the pharmacist with each prescription and refill. Be sure to read this information carefully each time. Talk to your care team about the use of this medication in children. Special care may be needed. Overdosage: If you think you have taken too much of this medicine contact a poison control center or emergency room at once. NOTE: This medicine is only for you. Do not share this medicine with others. What if I miss a dose? If you take your medication once a day, skip it. Take your next dose at the scheduled time the next morning. Do not take two doses on the same day. If you take your medication once a week, take the missed dose on the morning after you remember. Do not take two  doses on the same day. What may interact with this medication? Aluminum hydroxide Antacids Aspirin Calcium supplements Medications for inflammation like ibuprofen, naproxen, and others Iron supplements Magnesium supplements Vitamins with minerals This list may not describe all possible interactions. Give your health care provider a list of all the medicines, herbs, non-prescription drugs, or dietary supplements you use. Also tell them if you smoke, drink alcohol, or use illegal drugs. Some items may interact with your medicine. What should I watch for while using this medication? Visit your care team for regular checks on your progress. It may be some time before you see the benefit from this medication. Some people who take this medication have severe bone, joint, or muscle pain. This medication may  also increase your risk for jaw problems or a broken thigh bone. Tell your care team right away if you have severe pain in your jaw, bones, joints, or muscles. Tell you care team if you have any pain that does not go away or that gets worse. Tell your dentist and dental surgeon that you are taking this medication. You should not have major dental surgery while on this medication. See your dentist to have a dental exam and fix any dental problems before starting this medication. Take good care of your teeth while on this medication. Make sure you see your dentist for regular follow-up appointments. You should make sure you get enough calcium and vitamin D while you are taking this medication. Discuss the foods you eat and the vitamins you take with your care team. You may need blood work done while you are taking this medication. What side effects may I notice from receiving this medication? Side effects that you should report to your care team as soon as possible: Allergic reactions-skin rash, itching, hives, swelling of the face, lips, tongue, or throat Low calcium level-muscle pain or cramps, confusion, tingling, or numbness in the hands or feet Osteonecrosis of the jaw-pain, swelling, or redness in the mouth, numbness of the jaw, poor healing after dental work, unusual discharge from the mouth, visible bones in the mouth Pain or trouble swallowing Severe bone, joint, or muscle pain Stomach bleeding-bloody or black, tar-like stools, vomiting blood or brown material that looks like coffee grounds Side effects that usually do not require medical attention (report to your care team if they continue or are bothersome): Constipation Diarrhea Nausea Stomach pain This list may not describe all possible side effects. Call your doctor for medical advice about side effects. You may report side effects to FDA at 1-800-FDA-1088. Where should I keep my medication? Keep out of the reach of children and  pets. Store at room temperature between 15 and 30 degrees C (59 and 86 degrees F). Throw away any unused medication after the expiration date. NOTE: This sheet is a summary. It may not cover all possible information. If you have questions about this medicine, talk to your doctor, pharmacist, or health care provider.  2022 Elsevier/Gold Standard (2020-07-19 10:35:10)

## 2021-03-29 NOTE — Progress Notes (Signed)
Subjective:    Patient ID: Sheri [name], female    DOB: Apr 21, 1951, 70 y.o.   MRN: [id number]  CC: 6 month follow up for SVT, Afib, osteoporosis.   HPI  Sheri [name] is a 70 year old woman with PMH of SVT, Afib, hemorrhoids, MDD who presents for follow up.   Dexa with osteoporosis.  We discussed the nature of this disease and plan to treat with alendronate.  Sheri [name] is amenable.    Chronic diarrhea, following with GI.  Labs planned for today which Sheri [name] is going to get.  Lomotil helps somewhat, but the diarrhea comes back.  Sheri [name] may need another colonoscopy with biopsies.   BP low, feeling weaker.  Trying to keep up with fluids.  Blood work will be done today for GI.   Sore throat and congestion X 1 day.  Sheri [name] has not had the covid vaccine, but does get bronchitis occasionally.  Throat, nose and ears look okay on exam.  Sheri [name] notes that Sheri [name] fells better today.  I advised her to get tested for COVID as Omicron variant can cause sore throat only.     Review of Systems  Constitutional:  Positive for fatigue. Negative for activity change and appetite change.  HENT:  Positive for congestion, rhinorrhea and sore throat.   Respiratory:  Negative for cough and shortness of breath.   Cardiovascular:  Negative for chest pain and leg swelling.  Gastrointestinal:  Positive for abdominal distention, abdominal pain and diarrhea.  Neurological:  Positive for weakness. Negative for dizziness and light-headedness.  Psychiatric/Behavioral:  Negative for decreased concentration and dysphoric mood.       Objective:   Physical Exam Vitals and nursing note reviewed.  Constitutional:      General: Sheri [name] is not in acute distress.    Appearance: Normal appearance. Sheri [name] is normal weight. Sheri [name] is not toxic-appearing.  HENT:     Head: Normocephalic and atraumatic.     Right Ear: Tympanic membrane, ear canal and external ear normal. There is no impacted cerumen.     Left Ear: Tympanic membrane, ear canal and external ear  normal. There is no impacted cerumen.     Nose: No congestion or rhinorrhea.     Mouth/Throat:     Mouth: Mucous membranes are moist.     Pharynx: Oropharynx is clear. Posterior oropharyngeal erythema (mild) present. No oropharyngeal exudate.  Eyes:     General:        Right eye: No discharge.        Left eye: No discharge.     Conjunctiva/sclera: Conjunctivae normal.  Cardiovascular:     Rate and Rhythm: Normal rate.     Heart sounds: No murmur heard. Pulmonary:     Effort: Pulmonary effort is normal. No respiratory distress.  Abdominal:     General: Abdomen is flat. There is no distension.     Palpations: Abdomen is soft.     Tenderness: There is no abdominal tenderness.  Skin:    General: Skin is warm and dry.  Neurological:     General: No focal deficit present.     Mental Status: Sheri [name] is alert and oriented to person, place, and time. Mental status is at baseline.  Psychiatric:        Mood and Affect: Mood normal.        Behavior: Behavior normal.    No labs today - getting them with GI  Labs done: K+ improved, but still low, Renal function stable.  Assessment & Plan:  Follow up in 4 months.

## 2021-04-01 ENCOUNTER — Telehealth: Payer: Self-pay | Admitting: Gastroenterology

## 2021-04-01 NOTE — Telephone Encounter (Signed)
Entered in error

## 2021-04-01 NOTE — Assessment & Plan Note (Signed)
DEXA showed osteoporosis.  We discussed today and she is willing to start taking alendronate.  Rx sent.   Plan Start Alendronate, repeat DEXA in 2 years.

## 2021-04-01 NOTE — Assessment & Plan Note (Signed)
She is following with GI.  Multiple lab tests done which showed low K, but no other obvious findings.  She will follow up with them.  Taking lomotil with good results.  She has low running BP, but this was a tad lower than normal today.  We discussed her fluid intake and made sure she is taking in enough fluids daily.  I advised her to seek care if she develops lightheadedness, fever, chills or other signs of acute infection.

## 2021-04-01 NOTE — Assessment & Plan Note (Signed)
Flu vaccine today 

## 2021-04-01 NOTE — Assessment & Plan Note (Signed)
She follows with Dr. Suella Broad.  She is relatively stable at this time.  She is taking quetiapine, trazodone, venlafaxine.  Continue follow up with psychiatry.

## 2021-04-01 NOTE — Telephone Encounter (Signed)
Left message on machine to call back   START: Florastor or Align probiotic daily.   Please call back to our office or send a MyChart message to [facility] or Manuella Blackson in 10 - 14 days to report on how you are feeling.   We have sent the following medications to your pharmacy for you to pick up at your convenience:   START: Lomotil take 1 to 2 tablets every 4 to 6 hours as needed for diarrhea.

## 2021-04-01 NOTE — Telephone Encounter (Signed)
Pt has been dealing with diarrhea for 5 weeks, meds are not working pls call her.

## 2021-04-01 NOTE — Assessment & Plan Note (Signed)
Well controlled with propranolol at this time.  She has no palpitations, just feelings of fatigue which I believe are related to her chronic diarrhea. Continue to monitor, advised to hold if her BP remains low.

## 2021-04-01 NOTE — Telephone Encounter (Signed)
See results note dated 9/12

## 2021-04-03 ENCOUNTER — Ambulatory Visit (INDEPENDENT_AMBULATORY_CARE_PROVIDER_SITE_OTHER): Payer: Medicare Other | Admitting: Internal Medicine

## 2021-04-03 ENCOUNTER — Other Ambulatory Visit: Payer: Self-pay | Admitting: Gastroenterology

## 2021-04-03 DIAGNOSIS — R5383 Other fatigue: Secondary | ICD-10-CM

## 2021-04-03 DIAGNOSIS — J029 Acute pharyngitis, unspecified: Secondary | ICD-10-CM | POA: Diagnosis not present

## 2021-04-03 DIAGNOSIS — R438 Other disturbances of smell and taste: Secondary | ICD-10-CM | POA: Diagnosis not present

## 2021-04-03 DIAGNOSIS — Z20822 Contact with and (suspected) exposure to covid-19: Secondary | ICD-10-CM | POA: Diagnosis not present

## 2021-04-03 DIAGNOSIS — R058 Other specified cough: Secondary | ICD-10-CM | POA: Diagnosis not present

## 2021-04-03 DIAGNOSIS — R197 Diarrhea, unspecified: Secondary | ICD-10-CM | POA: Diagnosis not present

## 2021-04-03 NOTE — Assessment & Plan Note (Addendum)
Patient has had diarrhea for the last 5 weeks. She is following with GI and had multiple lab tests done, with no infectious etiology to her diarrhea noted. The patient continues to take lomitil, which helps her symptoms, although it does not completely resolve the diarrhea. Patient has also tried Imodium prior to this, which did not help her at all. Discussed keeping her fluid intake up to avoid dehydration. Also advised the patient to go to an urgent care/emergency department if she develops any fevers, chills, lightheadedness, or other signs of infection.   Plan: - Continue Lomitil - Follow up with GI

## 2021-04-03 NOTE — Patient Instructions (Signed)
Thank you, [name] for allowing [place] to provide your care today. Today we discussed: Diarrhea: Continue to take Lomitil every 4 hours for diarrhea. Advised to follow up with GI doctors regarding this Symptoms of COVID-19: Advised to get COVID tested at local pharmacy. Continue with supportive care and keep up your hydration. Also advised to purchase a pulse oximeter and check your oxygen levels if you begin to feel short of breath. Please seek evaluation at the nearest emergency dept if you feel short of breath and your oxygen level drops below 90.    I have ordered the following labs for you:  Lab Orders  No laboratory test(s) ordered today      Referrals ordered today:   Referral Orders  No referral(s) requested today     I have ordered the following medication/changed the following medications:   Stop the following medications: There are no discontinued medications.   Start the following medications: No orders of the defined types were placed in this encounter.    Follow up: 3 months     Should you have any questions or concerns please call the internal medicine clinic at [phone].     Sheri [name], D.O. [place]

## 2021-04-03 NOTE — Assessment & Plan Note (Signed)
Patient notes that over the last 5-6 days, she has felt "crummy". She has felt fatigued, developed a productive cough of yellow sputum, sore throat, and has also lost some of her taste/smell. She denies any fevers, chills, or shortness of breath. Of note, her husband has tested positive for COVID and the patient herself is not vaccine against COVID. The patient is on her way to the nearest pharmacy to get tested for COVID, as she does not have any at-home tests. Patient is also out of the window for Paxlovid, Remdesivir, or any other antiviral therapy if she were to test positive for COVID. Discussed with the patient that this diagnosis is likely, especially given her husband's positive test. Advised the patient to pick up a pulse oximeter to check her O2 levels if she were to begin to feel short of breath. Also advised her to go to the emergency department if she begins to feel SOB and her O2 level drops below 90.  Plan: - Supportive care - COVID test at pharmacy - Check pulse ox and go to ED if <90

## 2021-04-03 NOTE — Progress Notes (Signed)
Internal Medicine Clinic Attending  I spoke with the patient.  I personally confirmed the key portions of the history and exam documented by Dr. Raymondo Band   and I reviewed pertinent patient test results.  The assessment, diagnosis, and plan were formulated together and I agree with the documentation in the resident's note.

## 2021-04-03 NOTE — Progress Notes (Signed)
[facility] Telephone Encounter Continuity Care Appointment  HPI:  This telephone encounter was created for [name] on 04/03/2021 for the following purpose/cc not feeling well. She notes that since Friday, she has felt fatigued, has developed a sore throat and cough productive of yellow sputum, and has overall felt poor. Patient also reports that her sense of taste and smell have diminished over this time period. She denies any fevers, chills, chest pain, or shortness of breath. Patient also states that for the last 5 weeks she has had 4-5 episodes of diarrhea per day. She has had this workup completed, and has been taking lomotil, which helps her symptoms, but does not completely resolve them.     Past Medical History:  Past Medical History:  Diagnosis Date  . AF (atrial fibrillation) ([place]) 10/12/2015  . Allergic rhinitis   . Anxiety   . ANXIETY DISORDER, GENERALIZED 08/18/2006   Qualifier: Diagnosis of  By: [name] MD, [name]    . Arthritis    no per pt  . Atrial fibrillation with RVR ([place])   . Cancer ([place])    left lower eyelid, basal cell cancer  . Chiari I malformation ([place]) 03/07/2016   pt unsure of this  . Chronic sinusitis   . 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 COPD (chronic obstructive pulmonary disease) ([facility])    no per pt  . Depression    treated at [facility]  . GERD (gastroesophageal reflux disease)   . Goiter    hx of goiter  . Hx of atrial tachycardia since age 61 03/15/2013  . Memory loss 12/31/2016  . Mnire's disease 03/07/2016  . Palpitations    Associated with difficulty breathing  . Panic attacks   . Poisoning by selective serotonin reuptake inhibitors(969.03) Jan 2007   severe reaction with MAOI (nardil) and dextromethorphan  . Postmenopausal 12/31/2016  . 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 Following  with cardiology.   . Swollen gland 09/14/2018  . Tachycardia   . Tonsillitis 11/12/2018     ROS:  Review of Systems  Constitutional:  Positive for malaise/fatigue. Negative for chills and fever.  HENT:  Positive for congestion and sore throat.   Eyes: Negative.   Respiratory:  Positive for cough and sputum production. Negative for shortness of breath.   Cardiovascular:  Negative for chest pain.  Gastrointestinal:  Positive for diarrhea. Negative for blood in stool, melena, nausea and vomiting.  Genitourinary: Negative.   Musculoskeletal:  Positive for myalgias.  Neurological:  Negative for dizziness and headaches.     Assessment / Plan / Recommendations:  Please see A&P under problem oriented charting for assessment of the patient's acute and chronic medical conditions.  As always, pt is advised that if symptoms worsen or new symptoms arise, they should go to an urgent care facility or to to ER for further evaluation.   Consent and Medical Decision Making:  Patient discussed with and called with Dr. Angelia Mould This is a telephone encounter between Sheri [name] and Dorethea Clan on 04/03/2021 for fatigue, cough, and diarrhea. The visit was conducted with the patient located at home and Dorethea Clan at [facility]. The patient's identity was confirmed using their DOB and current address. The patient has consented to being evaluated through a telephone encounter and understands the associated risks (an examination cannot be done and the patient may need  to come in for an appointment) / benefits (allows the patient to remain at home, decreasing exposure to coronavirus). I personally spent 20 minutes on medical discussion.

## 2021-04-04 ENCOUNTER — Telehealth: Payer: Self-pay | Admitting: *Deleted

## 2021-04-04 NOTE — Telephone Encounter (Signed)
Patient called in stating she received Covid result today and it is positive. Symptoms include sore throat, runny nose, sneezing, cough, productive of thick white sputum, body aches. Denies fever/chills, SHOB. Advised plenty of fluids, especially water, plenty of rest, OTC meds as needed. Instructed to isolate for 5 days from onset of symptoms and to wear mask when out for additional 5 days.   She is requesting letter to be out of work 2/2 need for isolation. She will try to obtain letter through [place], but would also like letter mailed to her home address.

## 2021-04-05 ENCOUNTER — Telehealth: Payer: Self-pay | Admitting: Gastroenterology

## 2021-04-05 DIAGNOSIS — R197 Diarrhea, unspecified: Secondary | ICD-10-CM

## 2021-04-05 MED ORDER — COLESTIPOL HCL 1 G PO TABS: 1.0000 g | ORAL_TABLET | Freq: Two times a day (BID) | ORAL | 1 refills | Status: DC

## 2021-04-05 NOTE — Telephone Encounter (Signed)
Inbound call from pt requesting a call back stating that she is having severe diarrhea. Please give her a call. Also she needs to have her Lomotil refilled as well. Pt stated that she is completely out. Thank you

## 2021-04-05 NOTE — Addendum Note (Signed)
Addended by: Yetta Flock on: 04/05/2021 05:24 PM   Modules accepted: Orders

## 2021-04-05 NOTE — Telephone Encounter (Signed)
I called the patient back.  She has been suffering with loose stools for several weeks after her colonoscopy this past July.  Interestingly her symptoms started after the colonoscopy, it was a routine exam done for screening purposes which was pretty normal.  She had an infectious work-up as been negative.  Using Imodium, Lomotil, Pepto-Bismol over-the-counter with no relief of symptoms.  She is getting quite frustrated that she is not feeling any better.  She has had lab work done that has been normal recently.  Discussed options with her.  She may benefit from a follow-up visit to go through things in more detail with her and discussed if flex sig or other endoscopic evaluation is needed to rule out microscopic colitis.  She denies any new medications that would put her at risk for that.  We discussed options to try over the weekend since her symptoms seem to be worsening.  I offered her empiric cholestyramine or Colestid, she wanted to try that.  We will give Colestid 1 g twice daily over the weekend and she can use Lomotil with that as well. She should discuss with pharmacy to make sure this does not bind any of her other medications. She would like Dr. Carlean Purl to give her a call next week or coordinate some follow-up for this in the office.   Earnestine Leys can you please help coordinate follow-up with either Dr. Carlean Purl or PA for reassessment.  Thanks

## 2021-04-05 NOTE — Telephone Encounter (Signed)
Please contact patient regarding ongoing diarrhea.   Rx for lomotil faxed to pharmacy.

## 2021-04-05 NOTE — Telephone Encounter (Signed)
Dr. Marla Roe Pt. Pt states that she has had diarrhea for 5 weeks after her Colonoscopy. Pt has been taking probiotics along with Lomotil with no relief. Prescription sent for Lomotil  Pt stated that she has had labs done multiple times already. Pt requesting relief   Note Attached from 03/19/2021  from [place] PA  ASSESSMENT AND PLAN: *Diarrhea:  States that this began a couple of weeks after her colonoscopy, stools were normal for the first couple of weeks following that.  Stool studies negative.  No new meds, change in diet, travel.  Imodium helped initially but not really anymore.  Has never really been seen here for complaints of diarrhea in the past and no history of IBS listed in her chart.  Really not sure what is causing this.  I wonder if she had a disrupt of her normal gut bacteria from the colonoscopy and prep versus some type of infectious source with some postinfectious IBS.  I would like her to begin probiotic daily in the form of Florastor or align.  We will send prescription for Lomotil for her to use.  Would like to see how things go with this regimen over the next 10 to 14 days.  She will call back for follow-up at that point to give [place] an update.  She just had a colonoscopy, but if it continues then I wonder if we would need to do flexible sigmoidoscopy to take some biopsies.  I am going to check a CBC, BMP, TSH, sed rate, CRP, and celiac labs today as well.   Please advise as DOD

## 2021-04-08 NOTE — Telephone Encounter (Signed)
Pt was scheduled an appointment with Tye Savoy NP on 04/10/2021 @ 8:30   Pt made aware

## 2021-04-08 NOTE — Telephone Encounter (Signed)
She had c/o fecal incontinence issues in recovery  It is possible bowl microbiome disruption is a source of loose stools  If not watery than C diff and other infections seem very unlikely though can do cx/C diff testing depending upon hx  Needs good assessment of rectal fx at visit and my sense is regardless if having fecal incontinence pelvic PT would be appropriate (also ask about GU incontinence)   If she has a lot of GU sxs also then can refer to Dr. Sherlene Shams of urogyn as well

## 2021-04-10 ENCOUNTER — Ambulatory Visit: Payer: Medicare Other | Admitting: Nurse Practitioner

## 2021-04-11 DIAGNOSIS — Z20822 Contact with and (suspected) exposure to covid-19: Secondary | ICD-10-CM | POA: Diagnosis not present

## 2021-04-15 ENCOUNTER — Other Ambulatory Visit: Payer: Self-pay | Admitting: Gastroenterology

## 2021-04-15 ENCOUNTER — Other Ambulatory Visit: Payer: Self-pay | Admitting: Cardiology

## 2021-04-16 ENCOUNTER — Telehealth: Payer: Self-pay | Admitting: Internal Medicine

## 2021-04-16 MED ORDER — DIPHENOXYLATE-ATROPINE 2.5-0.025 MG PO TABS: ORAL_TABLET | ORAL | 0 refills | Status: DC

## 2021-04-16 MED ORDER — METRONIDAZOLE 500 MG PO TABS: 500.0000 mg | ORAL_TABLET | Freq: Two times a day (BID) | ORAL | 0 refills | Status: DC

## 2021-04-16 MED ORDER — METRONIDAZOLE 500 MG PO TABS: 500.0000 mg | ORAL_TABLET | Freq: Three times a day (TID) | ORAL | 0 refills | Status: AC

## 2021-04-16 NOTE — Telephone Encounter (Signed)
Spoke to patient.  She is still having diarrhea and needs a refill on generic Lomotil.  She thinks the colestipol might of helped somewhat.  She had been seen in August after colonoscopy demonstrated no abnormalities (was not for diarrhea) and had complained about some fecal smearing.  However subsequently she has had loose stools several times a day that are problematic.  She was to come in and see one of our NP's the other week but she developed COVID and was unable to.  We have decided empiric metronidazole 500 mg 3 times daily and as needed Lomotil and hold the colestipol.  I have asked her to use MyChart to message me about how she was feeling after this therapeutic trial.

## 2021-04-23 ENCOUNTER — Ambulatory Visit: Payer: Medicare Other | Admitting: Internal Medicine

## 2021-05-08 ENCOUNTER — Other Ambulatory Visit: Payer: Self-pay | Admitting: Internal Medicine

## 2021-05-08 NOTE — Telephone Encounter (Signed)
Need to make her an appointment? She thought you might want to see her.

## 2021-05-08 NOTE — Telephone Encounter (Signed)
Per Dr Carlean Purl may use a banding spot for her to be seen. Also putting her on the cancellation list.

## 2021-05-08 NOTE — Telephone Encounter (Signed)
I spoke with Sheri [name] and she is better but still having 2 episodes of diarrhea daily. She had to cancel her appointment with [place] recently due to having covid. The next appointments are in December unless you want me to work her in a banding spot or somewhere else Sir.

## 2021-06-18 ENCOUNTER — Ambulatory Visit: Payer: Medicare Other | Admitting: Internal Medicine

## 2021-06-20 ENCOUNTER — Other Ambulatory Visit: Payer: Self-pay | Admitting: Internal Medicine

## 2021-06-20 DIAGNOSIS — H8109 Meniere's disease, unspecified ear: Secondary | ICD-10-CM

## 2021-06-20 NOTE — Telephone Encounter (Signed)
Next appt scheduled 06/28/21 with PCP.

## 2021-06-21 ENCOUNTER — Other Ambulatory Visit: Payer: Medicare Other

## 2021-06-21 ENCOUNTER — Telehealth: Payer: Self-pay | Admitting: Cardiology

## 2021-06-21 NOTE — Telephone Encounter (Signed)
Patient c/o Palpitations:  High priority if patient c/o lightheadedness, shortness of breath, or chest pain  How long have you had palpitations/irregular HR/ Afib? Are you having the symptoms now? Two weeks/ no   Are you currently experiencing lightheadedness, SOB or CP? Lightheadedness and sob  Do you have a history of afib (atrial fibrillation) or irregular heart rhythm? yes  Have you checked your BP or HR? (document readings if available): no  Are you experiencing any other symptoms? no

## 2021-06-21 NOTE — Telephone Encounter (Signed)
The patient has a history of SVT and she complaining of palpitations for about two weeks. This is occurring randomly with no triggers. When heart is feeling normal heart rate ~80, she checks this back feeling a radial pulse. Unknown heart rate when she is having these episodes. Patient has lightheadedness and SOB when palpitations occur. This is occurring many times a day everyday, lasting for a few minutes at a time and then resolves. The patient takes propranolol with no missed doses. No lifestyle changes. ED precautions given.

## 2021-06-26 NOTE — Progress Notes (Deleted)
PCP:  Sid Falcon, MD Primary Cardiologist: None Electrophysiologist: [name], MD   Sheri [name] is a 70 y.o. female seen today for [name], MD for acute visit due to palpitations and SOB .  Since last being seen in our clinic the patient reports doing ***.  she denies chest pain, palpitations, dyspnea, PND, orthopnea, nausea, vomiting, dizziness, syncope, edema, weight gain, or early satiety.  Past Medical History:  Diagnosis Date   AF (atrial fibrillation) ([place]) 10/12/2015   Allergic rhinitis    Anxiety    ANXIETY DISORDER, GENERALIZED 08/18/2006   Qualifier: Diagnosis of  By: [name] MD, [name]     Arthritis    no per pt   Atrial fibrillation with RVR ([place])    Cancer ([place])    left lower eyelid, basal cell cancer   Chiari I malformation ([place]) 03/07/2016   pt unsure of this   Chronic sinusitis    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   COPD (chronic obstructive pulmonary disease) ([place])    no per pt   Depression    treated at [facility]   GERD (gastroesophageal reflux disease)    Goiter    hx of goiter   Hx of atrial tachycardia since age 48 03/15/2013   Memory loss 12/31/2016   Mnire's disease 03/07/2016   Palpitations    Associated with difficulty breathing   Panic attacks    Poisoning by selective serotonin reuptake inhibitors(969.03) Jan 2007   severe reaction with MAOI (nardil) and dextromethorphan   Postmenopausal 12/31/2016   SVT (supraventricular tachycardia) ([place]) 03/15/2013   History of SVT s/p afib ablation, 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 Following with cardiology.    Swollen gland 09/14/2018   Tachycardia    Tonsillitis 11/12/2018   Past Surgical History:  Procedure Laterality Date   APPENDECTOMY     COLONOSCOPY     ELECTROPHYSIOLOGIC STUDY N/A 10/12/2015   Procedure: Atrial Fibrillation Ablation;  Surgeon: [name], MD;  Location: [facility];  Service: Cardiovascular;   Laterality: N/A;   LID LESION EXCISION Left 08/06/2017   Procedure: EXCISION BIOPSY OF EYELID, RECONSTRUCTION OF EYELID;  Surgeon: [name], MD;  Location: [facility];  Service: Ophthalmology;  Laterality: Left;   OVARIAN CYST REMOVAL  09/1967   left ovarian cystectomy- 17.5 lb cyst removed- per pt   right mastoidectomy     ET tube placement in 1980 and 1990   right sinus surgery  october 2000   SVT ABLATION N/A 07/25/2020   Procedure: SVT ABLATION;  Surgeon: [name], MD;  Location: [facility];  Service: Cardiovascular;  Laterality: N/A;   SVT ABLATION N/A 10/26/2020   Procedure: SVT ABLATION;  Surgeon: [name], MD;  Location: [facility];  Service: Cardiovascular;  Laterality: N/A;   THYROIDECTOMY, PARTIAL     nodule removal - April 1996    Current Outpatient Medications  Medication Sig Dispense Refill   albuterol (VENTOLIN HFA) 108 (90 Base) MCG/ACT inhaler Inhale 2 puffs into the lungs every 6 (six) hours as needed for wheezing or shortness of breath. 8 g 2   alendronate (FOSAMAX) 70 MG tablet Take 1 tablet (70 mg total) by mouth every 7 (seven) days. Take with a full glass of water on an empty stomach. 4 tablet 11   colestipol (COLESTID) 1 g tablet Take 1 tablet (1 g total) by mouth 2 (two) times daily. 60 tablet 1  diphenoxylate-atropine (LOMOTIL) 2.5-0.025 MG tablet TAKE 1 TO 2 TABLETS BY MOUTH EVERY 4 TO 6 HOURS AS NEEDED 60 tablet 0   meclizine (ANTIVERT) 25 MG tablet TAKE 1 TABLET BY MOUTH THREE TIMES DAILY AS NEEDED FOR DIZZINESS 20 tablet 0   potassium chloride SA (KLOR-CON) 20 MEQ tablet TAKE 2 TABLETS BY MOUTH ONCE DAILY FOR 4 DOSES 8 tablet 0   propranolol (INDERAL) 10 MG tablet Take 1 tablet by mouth twice daily 180 tablet 1   QUEtiapine (SEROQUEL) 200 MG tablet Take 100 mg by mouth at bedtime.     traZODone (DESYREL) 100 MG tablet Take 100 mg by mouth at bedtime.     venlafaxine XR (EFFEXOR-XR) 75 MG 24 hr capsule Take 150 mg by  mouth daily.     No current facility-administered medications for this visit.    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   Social Determinants of Health   Financial Resource Strain: Not on file  Food Insecurity: Not on file  Transportation Needs: Not on file  Physical Activity: Not on file  Stress: Not on file  Social Connections: Not on file  Intimate Partner Violence: Not on file     Review of Systems: All other systems reviewed and are otherwise negative except as noted above.  Physical Exam: There were no vitals filed for this visit.  GEN- The patient is well appearing, alert and oriented x 3 today.   HEENT: normocephalic, atraumatic; sclera clear, conjunctiva pink; hearing intact; oropharynx clear; neck supple, no JVP Lymph- no cervical lymphadenopathy Lungs- Clear to ausculation bilaterally, normal work of breathing.  No wheezes, rales, rhonchi Heart- Regular rate and rhythm, no murmurs, rubs or gallops, PMI not laterally displaced GI- soft, non-tender, non-distended, bowel sounds present, no hepatosplenomegaly Extremities- no clubbing, cyanosis, or edema; DP/PT/radial pulses 2+ bilaterally MS- no significant deformity or atrophy Skin- warm and dry, no rash or lesion Psych- euthymic mood, full affect Neuro- strength and sensation are intact  EKG is  ordered. Personal review of EKG from today shows ***  Additional studies reviewed include: Previous EP office notes.   Assessment and Plan:  1. Paroxysmal Afib     CHA2DS2Vasc is 2 (age, gender)     S/p ablation, off a/c without recurrence of AF.             2. SVT     S/p 2 negative EP studies Recent increase in palpitations She is on propranolol 10 mg BID  Follow up with Dr. [name] in {Blank single:19197::"2 weeks","4 weeks","3 months","6 months","12 months","as usual post gen change"}   Shirley Friar, PA-C  06/26/21 10:47 AM

## 2021-06-27 ENCOUNTER — Ambulatory Visit: Payer: Medicare Other | Admitting: Student

## 2021-06-28 ENCOUNTER — Ambulatory Visit (INDEPENDENT_AMBULATORY_CARE_PROVIDER_SITE_OTHER): Payer: Medicare Other | Admitting: Internal Medicine

## 2021-06-28 ENCOUNTER — Other Ambulatory Visit: Payer: Self-pay

## 2021-06-28 ENCOUNTER — Encounter: Payer: Self-pay | Admitting: Internal Medicine

## 2021-06-28 VITALS — BP 112/62 | HR 91 | Temp 98.0°F | Ht 65.5 in | Wt 152.0 lb

## 2021-06-28 DIAGNOSIS — R200 Anesthesia of skin: Secondary | ICD-10-CM | POA: Insufficient documentation

## 2021-06-28 DIAGNOSIS — H8109 Meniere's disease, unspecified ear: Secondary | ICD-10-CM | POA: Diagnosis not present

## 2021-06-28 DIAGNOSIS — R197 Diarrhea, unspecified: Secondary | ICD-10-CM | POA: Diagnosis not present

## 2021-06-28 DIAGNOSIS — I480 Paroxysmal atrial fibrillation: Secondary | ICD-10-CM | POA: Diagnosis not present

## 2021-06-28 DIAGNOSIS — N6452 Nipple discharge: Secondary | ICD-10-CM | POA: Diagnosis not present

## 2021-06-28 DIAGNOSIS — M810 Age-related osteoporosis without current pathological fracture: Secondary | ICD-10-CM

## 2021-06-28 DIAGNOSIS — Z0000 Encounter for general adult medical examination without abnormal findings: Secondary | ICD-10-CM

## 2021-06-28 DIAGNOSIS — R202 Paresthesia of skin: Secondary | ICD-10-CM

## 2021-06-28 DIAGNOSIS — L989 Disorder of the skin and subcutaneous tissue, unspecified: Secondary | ICD-10-CM | POA: Insufficient documentation

## 2021-06-28 NOTE — Patient Instructions (Signed)
Thank you, [name] for allowing [place] to provide your care today. Today we discussed:  Left leg weakness: - Keep track of symptoms and we will discuss at next visit.  Lesion on your back: - We will re-evaluate the spot on your back at your next visit and discuss whether it needs to be removed.  Tests ordered today:  MRI brain Serum prolactin   Follow up:  January 20th    Remember: Should you have any questions or concerns please call the internal medicine clinic at [phone].

## 2021-06-28 NOTE — Assessment & Plan Note (Addendum)
Pt reports one week history of left leg numbness and tingling.  We reviewed possible lifestyle issues that could be contributing including crossing of the legs.  She notes that it is worse when she rises from a sitting to a standing position.  She is not having it now.  I advised her to keep note of when this occurs and se if she can find a pattern that would help [place] further delineate the pathology. Her Neuro exam including gait was normal today.

## 2021-06-28 NOTE — Assessment & Plan Note (Signed)
She reports recent feelings of palpitations, though no arrhythmias appreciated on auscultation. Her palpitations may represent intermittent atrial fibrillation and she plans to follow up with her cardiologist regarding this during her appointment this month. Her CHADSVASC score is 2, but she is not on any anticoagulation therapy at this time. - Continue propranolol - Consider anticoagulation therapy.

## 2021-06-28 NOTE — Progress Notes (Signed)
  Subjective:     Patient ID: Sheri [name], female   DOB: 05/05/1951, 70 y.o.   MRN: [id number]  Sheri [name] is a 70 y/o who presents to clinic for routine follow-up. Today she reports that her diarrhea has completely resolved with antibiotic treatment. Her main concerns today are a spot on her back that she is concerned about, as well as numbness in her left leg that has been occurring off and on for the past week. She describes the numbness as intermittent, occurring from the knee down only, and accompanied by a tingling sensation but no pain. She also reports dizziness/lightheadedness that has been occurring more frequently, but that she has been successfully treating with meclizine. Finally, she reports feeling some intermittent palpitations, and plans to follow up on this with her cardiologist at her appointment this month.     Review of Systems  Cardiovascular:  Positive for palpitations.  Gastrointestinal:  Negative for diarrhea.  Neurological:  Positive for dizziness.      Objective:   Physical Exam Constitutional:      Appearance: Normal appearance.  HENT:     Head: Normocephalic and atraumatic.  Cardiovascular:     Rate and Rhythm: Normal rate and regular rhythm.     Heart sounds: Normal heart sounds.  Pulmonary:     Effort: Pulmonary effort is normal. No respiratory distress.     Breath sounds: Normal breath sounds.  Abdominal:     General: Bowel sounds are normal.  Musculoskeletal:     Right lower leg: No edema.     Left lower leg: No edema.  Skin:    General: Skin is warm and dry.     Comments: Skin lesion on left mid-back that is well-circumscribed and overlaid with light-brown scab.  Neurological:     Mental Status: She is alert.       Assessment & Plan:     See problem-based charting.

## 2021-06-28 NOTE — Assessment & Plan Note (Signed)
She reports increased frequency of dizziness/lightheadedness that is consistent with previous symptoms. Her symptoms continue to be successfully treated with meclizine as needed, and she requests a refill of this medication today. - continue Meclizine 25mg  prn

## 2021-06-28 NOTE — Assessment & Plan Note (Signed)
Does not want pneumonia vaccine at today's visit.

## 2021-06-28 NOTE — Assessment & Plan Note (Addendum)
Skin lesion noted on left side of her back. Possible seborrheic keratosis vs. melanocytic nevus and will plan to re-evaluate at next visit in 6 weeks to assess whether it has changed in size or appearance.

## 2021-06-28 NOTE — Assessment & Plan Note (Signed)
She reports continuing to experience this but that it has been ongoing since the birth of her last son ~40 years ago. Her most recent mammogram in July showed fibrocystic changes, so we will order prolactin labs and MR of sella turcica to further evaluate. - Serum prolactin level - MR Sella turcica

## 2021-06-29 LAB — BMP8+ANION GAP
Anion Gap: 13 mmol/L (ref 10.0–18.0)
BUN/Creatinine Ratio: 16 (ref 12–28)
BUN: 11 mg/dL (ref 8–27)
CO2: 24 mmol/L (ref 20–29)
Calcium: 9.5 mg/dL (ref 8.7–10.3)
Chloride: 105 mmol/L (ref 96–106)
Creatinine, Ser: 0.69 mg/dL (ref 0.57–1.00)
Glucose: 76 mg/dL (ref 70–99)
Potassium: 4.3 mmol/L (ref 3.5–5.2)
Sodium: 142 mmol/L (ref 134–144)
eGFR: 93 mL/min/{1.73_m2} (ref >59)

## 2021-06-29 LAB — PROLACTIN: Prolactin: 9.4 ng/mL (ref 4.8–23.3)

## 2021-07-02 NOTE — Assessment & Plan Note (Signed)
She is doing well with alendronate.  We will continue this today.

## 2021-07-02 NOTE — Progress Notes (Signed)
Attestation for Student Documentation:  I personally was present and performed or re-performed the history, physical exam and medical decision-making activities of this service and have verified that the service and findings are accurately documented in the student's note.  Neuro: Gait is normal.  No change to sensation with light touch in the lower extremities.  Strength intact in lower extremities.   Sid Falcon, MD 07/02/2021, 6:35 AM

## 2021-07-03 NOTE — Telephone Encounter (Signed)
Pt scheduled to see EP APP next week.

## 2021-07-05 ENCOUNTER — Encounter: Payer: Self-pay | Admitting: Internal Medicine

## 2021-07-08 NOTE — Progress Notes (Signed)
PCP:  Sid Falcon, MD Primary Cardiologist: None Electrophysiologist: [name], MD   Sheri [name] is a 70 y.o. female seen today for [name], MD for acute visit due to palpitations and SOB .  Since last being seen in our clinic the patient reports increasing palpitations over the past month. She describes them as new and more concerning. Symptomatically, she describes it as a single hard or skipped beat, vs a brief period of her heart "scrambling". She can't describe her symptoms further.  she denies chest pain, dyspnea, PND, orthopnea, nausea, vomiting, dizziness, syncope, edema, weight gain, or early satiety.  Past Medical History:  Diagnosis Date   AF (atrial fibrillation) ([place]) 10/12/2015   Allergic rhinitis    Anxiety    ANXIETY DISORDER, GENERALIZED 08/18/2006   Qualifier: Diagnosis of  By: [name] MD, [name]     Arthritis    no per pt   Atrial fibrillation with RVR ([place])    Cancer ([place])    left lower eyelid, basal cell cancer   Chiari I malformation ([place]) 03/07/2016   pt unsure of this   Chronic sinusitis    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   COPD (chronic obstructive pulmonary disease) ([place])    no per pt   Depression    treated at [facility]   GERD (gastroesophageal reflux disease)    Goiter    hx of goiter   Hx of atrial tachycardia since age 21 03/15/2013   Memory loss 12/31/2016   Mnire's disease 03/07/2016   Palpitations    Associated with difficulty breathing   Panic attacks    Poisoning by selective serotonin reuptake inhibitors(969.03) Jan 2007   severe reaction with MAOI (nardil) and dextromethorphan   Postmenopausal 12/31/2016   SVT (supraventricular tachycardia) ([place]) 03/15/2013   History of SVT s/p afib ablation, 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 Following with cardiology.    Swollen gland 09/14/2018   Tachycardia    Tonsillitis 11/12/2018   Past Surgical History:  Procedure Laterality Date    APPENDECTOMY     COLONOSCOPY     ELECTROPHYSIOLOGIC STUDY N/A 10/12/2015   Procedure: Atrial Fibrillation Ablation;  Surgeon: [name], MD;  Location: [facility];  Service: Cardiovascular;  Laterality: N/A;   LID LESION EXCISION Left 08/06/2017   Procedure: EXCISION BIOPSY OF EYELID, RECONSTRUCTION OF EYELID;  Surgeon: [name], MD;  Location: [facility];  Service: Ophthalmology;  Laterality: Left;   OVARIAN CYST REMOVAL  09/1967   left ovarian cystectomy- 17.5 lb cyst removed- per pt   right mastoidectomy     ET tube placement in 1980 and 1990   right sinus surgery  october 2000   SVT ABLATION N/A 07/25/2020   Procedure: SVT ABLATION;  Surgeon: [name], MD;  Location: [facility];  Service: Cardiovascular;  Laterality: N/A;   SVT ABLATION N/A 10/26/2020   Procedure: SVT ABLATION;  Surgeon: [name], MD;  Location: [facility];  Service: Cardiovascular;  Laterality: N/A;   THYROIDECTOMY, PARTIAL     nodule removal - April 1996    Current Outpatient Medications  Medication Sig Dispense Refill   albuterol (VENTOLIN HFA) 108 (90 Base) MCG/ACT inhaler Inhale 2 puffs into the lungs every 6 (six) hours as needed for wheezing or shortness of breath. 8 g 2   alendronate (FOSAMAX) 70 MG tablet Take 1 tablet (70 mg total) by mouth every 7 (seven) days. Take  with a full glass of water on an empty stomach. 4 tablet 11   meclizine (ANTIVERT) 25 MG tablet TAKE 1 TABLET BY MOUTH THREE TIMES DAILY AS NEEDED FOR DIZZINESS 20 tablet 0   propranolol (INDERAL) 10 MG tablet Take 1 tablet by mouth twice daily 180 tablet 1   QUEtiapine (SEROQUEL) 200 MG tablet Take 100 mg by mouth at bedtime.     traZODone (DESYREL) 100 MG tablet Take 100 mg by mouth at bedtime.     venlafaxine XR (EFFEXOR-XR) 75 MG 24 hr capsule Take 150 mg by mouth daily.     No current facility-administered medications for this visit.    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   Social Determinants of Health   Financial Resource Strain: Not on file  Food Insecurity: Not on file  Transportation Needs: Not on file  Physical Activity: Not on file  Stress: Not on file  Social Connections: Not on file  Intimate Partner Violence: Not on file     Review of Systems: All other systems reviewed and are otherwise negative except as noted above.  Physical Exam: Vitals:   07/09/21 0914  BP: 102/72  Pulse: 84  SpO2: 97%  Weight: 151 lb (68.5 kg)  Height: 5' 5.5" (1.664 m)    GEN- The patient is well appearing, alert and oriented x 3 today.   HEENT: normocephalic, atraumatic; sclera clear, conjunctiva pink; hearing intact; oropharynx clear; neck supple, no JVP Lymph- no cervical lymphadenopathy Lungs- Clear to ausculation bilaterally, normal work of breathing.  No wheezes, rales, rhonchi Heart- Regular rate and rhythm, no murmurs, rubs or gallops, PMI not laterally displaced GI- soft, non-tender, non-distended, bowel sounds present, no hepatosplenomegaly Extremities- no clubbing, cyanosis, or edema; DP/PT/radial pulses 2+ bilaterally MS- no significant deformity or atrophy Skin- warm and dry, no rash or lesion Psych- euthymic mood, full affect Neuro- strength and sensation are intact  EKG is ordered. Personal review of EKG from today  shows NSR at 83 bpm, stable intervals  Additional studies reviewed include: Previous EP office notes.   Assessment and Plan:  1. Paroxysmal Afib     CHA2DS2Vasc is 2 (age, gender)     S/p ablation, off a/c without recurrence of AF.           2. SVT, adenosine responsive  S/p 2 negative EP studies  3. Palpitations Recent increase in palpitations, describes them as different than her SVT symptoms.  Monitor with Zio XT, mostly for re-assurance.  She is on propranolol 10 mg BID. Consider increasing based on monitor results. Offered increased today but pt didn't want to mask any potential arrhythmias prior to monitoring.  Re-assurance given that work up thus far is most consistent  with benign ectopy or brief SVT, neither of which are life threatening or dangerous.  Labs today. Consider updating echo, especially if eventually need to consider   Follow up with EP APP in 4-6 weeks pending monitoring.  Shirley Friar, PA-C  07/09/21 9:38 AM

## 2021-07-09 ENCOUNTER — Encounter: Payer: Self-pay | Admitting: Cardiology

## 2021-07-09 ENCOUNTER — Ambulatory Visit (INDEPENDENT_AMBULATORY_CARE_PROVIDER_SITE_OTHER): Payer: Medicare Other

## 2021-07-09 ENCOUNTER — Encounter: Payer: Self-pay | Admitting: Student

## 2021-07-09 ENCOUNTER — Other Ambulatory Visit: Payer: Self-pay

## 2021-07-09 ENCOUNTER — Ambulatory Visit (INDEPENDENT_AMBULATORY_CARE_PROVIDER_SITE_OTHER): Payer: Medicare Other | Admitting: Student

## 2021-07-09 VITALS — BP 102/72 | HR 84 | Ht 65.5 in | Wt 151.0 lb

## 2021-07-09 DIAGNOSIS — I471 Supraventricular tachycardia: Secondary | ICD-10-CM

## 2021-07-09 DIAGNOSIS — R002 Palpitations: Secondary | ICD-10-CM

## 2021-07-09 NOTE — Patient Instructions (Signed)
Medication Instructions:  Your physician recommends that you continue on your current medications as directed. Please refer to the Current Medication list given to you today.  *If you need a refill on your cardiac medications before your next appointment, please call your pharmacy*   Lab Work: TODAY: BMET, CBC, TSH  If you have labs (blood work) drawn today and your tests are completely normal, you will receive your results only by: [facility] (if you have MyChart) OR A paper copy in the mail If you have any lab test that is abnormal or we need to change your treatment, we will call you to review the results.   Follow-Up: At [facility], you and your health needs are our priority.  As part of our continuing mission to provide you with exceptional heart care, we have created designated Provider Care Teams.  These Care Teams include your primary Cardiologist (physician) and Advanced Practice Providers (APPs -  Physician Assistants and Nurse Practitioners) who all work together to provide you with the care you need, when you need it.   Your next appointment:   As scheduled  Other Instructions  ZIO XT- Long Term Monitor Instructions  Your physician has requested you wear a ZIO patch monitor for 7 days.  This is a single patch monitor. [employer] supplies one patch monitor per enrollment. Additional stickers are not available. Please do not apply patch if you will be having a Nuclear Stress Test,  Echocardiogram, Cardiac CT, MRI, or Chest Xray during the period you would be wearing the  monitor. The patch cannot be worn during these tests. You cannot remove and re-apply the  ZIO XT patch monitor.  Your ZIO patch monitor will be mailed 3 day USPS to your address on file. It may take 3-5 days  to receive your monitor after you have been enrolled.  Once you have received your monitor, please review the enclosed instructions. Your monitor  has already been registered assigning a  specific monitor serial # to you.  Billing and Patient Assistance Program Information  We have supplied [employer] with any of your insurance information on file for billing purposes. [employer] offers a sliding scale Patient Assistance Program for patients that do not have  insurance, or whose insurance does not completely cover the cost of the ZIO monitor.  You must apply for the Patient Assistance Program to qualify for this discounted rate.  To apply, please call [employer] at [phone], select option 4, select option 2, ask to apply for  Patient Assistance Program. Theodore Demark will ask your household income, and how many people  are in your household. They will quote your out-of-pocket cost based on that information.  [employer] will also be able to set up a 25-month, interest-free payment plan if needed.  Applying the monitor   Shave hair from upper left chest.  Hold abrader disc by orange tab. Rub abrader in 40 strokes over the upper left chest as  indicated in your monitor instructions.  Clean area with 4 enclosed alcohol pads. Let dry.  Apply patch as indicated in monitor instructions. Patch will be placed under collarbone on left  side of chest with arrow pointing upward.  Rub patch adhesive wings for 2 minutes. Remove white label marked "1". Remove the white  label marked "2". Rub patch adhesive wings for 2 additional minutes.  While looking in a mirror, press and release button in center of patch. A small green light will  flash 3-4 times. This will be your  only indicator that the monitor has been turned on.  Do not shower for the first 24 hours. You may shower after the first 24 hours.  Press the button if you feel a symptom. You will hear a small click. Record Date, Time and  Symptom in the Patient Logbook.  When you are ready to remove the patch, follow instructions on the last 2 pages of Patient  Logbook. Stick patch monitor onto the last page of Patient Logbook.  Place Patient  Logbook in the blue and white box. Use locking tab on box and tape box closed  securely. The blue and white box has prepaid postage on it. Please place it in the mailbox as  soon as possible. Your physician should have your test results approximately 7 days after the  monitor has been mailed back to [facility].  Call [facility] at [phone] if you have questions regarding  your ZIO XT patch monitor. Call them immediately if you see an orange light blinking on your  monitor.  If your monitor falls off in less than 4 days, contact our Monitor department at [phone].  If your monitor becomes loose or falls off after 4 days call [employer] at [phone] for  suggestions on securing your monitor

## 2021-07-09 NOTE — Progress Notes (Unsigned)
Applied a 7 day Zio XT monitor to patient in the office  Dr [name] to read

## 2021-07-10 LAB — CBC
Hematocrit: 41.5 % (ref 34.0–46.6)
Hemoglobin: 13.8 g/dL (ref 11.1–15.9)
MCH: 29.4 pg (ref 26.6–33.0)
MCHC: 33.3 g/dL (ref 31.5–35.7)
MCV: 88 fL (ref 79–97)
Platelets: 370 10*3/uL (ref 150–450)
RBC: 4.7 x10E6/uL (ref 3.77–5.28)
RDW: 12.2 % (ref 11.7–15.4)
WBC: 8 10*3/uL (ref 3.4–10.8)

## 2021-07-10 LAB — BASIC METABOLIC PANEL
BUN/Creatinine Ratio: 17 (ref 12–28)
BUN: 14 mg/dL (ref 8–27)
CO2: 24 mmol/L (ref 20–29)
Calcium: 9.5 mg/dL (ref 8.7–10.3)
Chloride: 103 mmol/L (ref 96–106)
Creatinine, Ser: 0.83 mg/dL (ref 0.57–1.00)
Glucose: 93 mg/dL (ref 70–99)
Potassium: 4.2 mmol/L (ref 3.5–5.2)
Sodium: 141 mmol/L (ref 134–144)
eGFR: 76 mL/min/{1.73_m2} (ref >59)

## 2021-07-10 LAB — TSH: TSH: 4.01 u[IU]/mL (ref 0.450–4.500)

## 2021-07-18 ENCOUNTER — Ambulatory Visit (HOSPITAL_COMMUNITY): Admission: RE | Admit: 2021-07-18 | Payer: Medicare Other | Source: Ambulatory Visit

## 2021-07-23 ENCOUNTER — Ambulatory Visit: Payer: Medicare Other | Admitting: Internal Medicine

## 2021-07-26 DIAGNOSIS — I471 Supraventricular tachycardia: Secondary | ICD-10-CM | POA: Diagnosis not present

## 2021-07-26 DIAGNOSIS — R002 Palpitations: Secondary | ICD-10-CM | POA: Diagnosis not present

## 2021-08-05 NOTE — Progress Notes (Deleted)
Cardiology Office Note Date:  08/05/2021  Patient ID:  Sheri [name], Sheri [name] 1951/03/13, MRN [id number] PCP:  Sid Falcon, MD  Cardiologist:  Dr. Debara Pickett Electrophysiologist: Dr. [name]     Chief Complaint:  *** f/u on monitor, palpitations  History of Present Illness: Sheri [name] is a 71 y.o. female with history of COPD, OA, Afib, SVT.  With increased SVT burden he underwent an EPS 07/25/20 unable to induce her tachycardia. Had her tachycardia in post op, discharged on Toprol. She saw Dr. [name] 08/23/20 in follow up and wanted to avoid the medication and asked to pursue another EPs and hopefully ablation.  She underwent EPS again 10/26/20 and again without inducible arrhythmias  She comes in today to be seen for Dr. [name], last seen by him at her last ablation procedure. She saw A. Tillery, PA-C most recently on 07/09/21 with an up tick in her palpitations, described a single hard or skipped beat, vs a brief period of her heart "scrambling" These were different then her SVT, planned for a monitor and discussed increaseing her BB dose, but she wanted to hold off until the monitor was done.  Jan 2023: Predominant underlying rhythm was sinus rhythm 8 SVT runs occurred, longest lasting 16 beats Less than 1% ventricular and supraventricular ectopy Triggered episodes associated with sinus rhythm   AFib Hx Diagnosed 2014 PVI ablation 10/12/2015 SVT EPS 07/25/20 unable to induce her tachycardia Wanting to avoid medications opted for repeat EPS 10/26/20 and again without inducible arrhythmias  Past Medical History:  Diagnosis Date   AF (atrial fibrillation) ([place]) 10/12/2015   Allergic rhinitis    Anxiety    ANXIETY DISORDER, GENERALIZED 08/18/2006   Qualifier: Diagnosis of  By: [name] MD, [name]     Arthritis    no per pt   Atrial fibrillation with RVR ([place])    Cancer ([place])    left lower eyelid, basal cell cancer   Chiari I malformation ([place]) 03/07/2016   pt unsure of this    Chronic sinusitis    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   COPD (chronic obstructive pulmonary disease) ([place])    no per pt   Depression    treated at [facility]   GERD (gastroesophageal reflux disease)    Goiter    hx of goiter   Hx of atrial tachycardia since age 63 03/15/2013   Memory loss 12/31/2016   Mnire's disease 03/07/2016   Palpitations    Associated with difficulty breathing   Panic attacks    Poisoning by selective serotonin reuptake inhibitors(969.03) Jan 2007   severe reaction with MAOI (nardil) and dextromethorphan   Postmenopausal 12/31/2016   SVT (supraventricular tachycardia) ([place]) 03/15/2013   History of SVT s/p afib ablation, 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 Following with cardiology.    Swollen gland 09/14/2018   Tachycardia    Tonsillitis 11/12/2018    Past Surgical History:  Procedure Laterality Date   APPENDECTOMY     COLONOSCOPY     ELECTROPHYSIOLOGIC STUDY N/A 10/12/2015   Procedure: Atrial Fibrillation Ablation;  Surgeon: [name], MD;  Location: [facility];  Service: Cardiovascular;  Laterality: N/A;   LID LESION EXCISION Left 08/06/2017   Procedure: EXCISION BIOPSY OF EYELID, RECONSTRUCTION OF EYELID;  Surgeon: [name], MD;  Location: [facility];  Service: Ophthalmology;  Laterality: Left;   OVARIAN CYST REMOVAL  09/1967   left ovarian cystectomy- 17.5 lb cyst removed- per pt   right  mastoidectomy     ET tube placement in 1980 and 1990   right sinus surgery  october 2000   SVT ABLATION N/A 07/25/2020   Procedure: SVT ABLATION;  Surgeon: [name], MD;  Location: [facility];  Service: Cardiovascular;  Laterality: N/A;   SVT ABLATION N/A 10/26/2020   Procedure: SVT ABLATION;  Surgeon: [name], MD;  Location: [facility];  Service: Cardiovascular;  Laterality: N/A;   THYROIDECTOMY, PARTIAL     nodule removal - April 1996    Current Outpatient Medications   Medication Sig Dispense Refill   albuterol (VENTOLIN HFA) 108 (90 Base) MCG/ACT inhaler Inhale 2 puffs into the lungs every 6 (six) hours as needed for wheezing or shortness of breath. 8 g 2   alendronate (FOSAMAX) 70 MG tablet Take 1 tablet (70 mg total) by mouth every 7 (seven) days. Take with a full glass of water on an empty stomach. 4 tablet 11   meclizine (ANTIVERT) 25 MG tablet TAKE 1 TABLET BY MOUTH THREE TIMES DAILY AS NEEDED FOR DIZZINESS 20 tablet 0   propranolol (INDERAL) 10 MG tablet Take 1 tablet by mouth twice daily 180 tablet 1   QUEtiapine (SEROQUEL) 200 MG tablet Take 100 mg by mouth at bedtime.     traZODone (DESYREL) 100 MG tablet Take 100 mg by mouth at bedtime.     venlafaxine XR (EFFEXOR-XR) 75 MG 24 hr capsule Take 150 mg by mouth daily.     No current facility-administered medications for this visit.    Allergies:   Guaifenesin   Social History:  The patient  reports that she has never smoked. She has never used smokeless tobacco. She reports that she does not drink alcohol and does not use drugs.   Family History:  The patient's family history includes Atrial fibrillation in her sister and sister; Breast cancer (age of onset: 76) in her niece; Heart attack in her father; Hypertension in her father and mother; Psoriasis in her father.  ROS:  Please see the history of present illness.    All other systems are reviewed and otherwise negative.   PHYSICAL EXAM:  VS:  There were no vitals taken for this visit. BMI: There is no height or weight on file to calculate BMI. Well nourished, well developed, in no acute distress HEENT: normocephalic, atraumatic Neck: no JVD, carotid bruits or masses Cardiac:  *** RRR; no significant murmurs, no rubs, or gallops Lungs: ***  CTA b/l, no wheezing, rhonchi or rales Abd: soft, nontender MS: no deformity or atrophy Ext:  *** edema Skin: warm and dry, no rash Neuro:  No gross deficits appreciated Psych: euthymic mood, full  affect    EKG:  Not done today    Jan 2023: Predominant underlying rhythm was sinus rhythm 8 SVT runs occurred, longest lasting 16 beats Less than 1% ventricular and supraventricular ectopy Triggered episodes associated with sinus rhythm  10/26/20 EPS CONCLUSIONS:  1. Sinus rhythm upon presentation.  2.  Negative EP study 3. No early apparent complications.      07/25/2020: EPS CONCLUSIONS:  1. Sinus rhythm upon presentation.  2. No inducible arrhythmias 3. No early apparent complications   03/07/2992: EPS/ablation CONCLUSIONS: 1. Sinus rhythm upon presentation.   2. Successful electrical isolation and anatomical encircling of all four pulmonary veins with radiofrequency current. 3. No inducible arrhythmias following ablation both on and off of Isuprel 4. No early apparent complications.  03/15/2013: TTE Study Conclusions  - Left  ventricle: The cavity size was normal. Wall thickness    was increased in a pattern of mild LVH. Systolic function    was normal. The estimated ejection fraction was in the    range of 55% to 60%. Wall motion was normal; there were no    regional wall motion abnormalities. Left ventricular    diastolic function parameters were normal.  - Left atrium: The atrium was normal in size.  - Tricuspid valve: Poorly visualized. Trivial regurgitation.  - Pulmonary arteries: PA peak pressure: 11mm Hg (S).  - Inferior vena cava: The vessel was normal in size; the    respirophasic diameter changes were in the normal range (=    50%); findings are consistent with normal central venous    pressure.      Recent Labs: 07/09/2021: BUN 14; Creatinine, Ser 0.83; Hemoglobin 13.8; Platelets 370; Potassium 4.2; Sodium 141; TSH 4.010  No results found for requested labs within last 8760 hours.   CrCl cannot be calculated (Patient's most recent lab result is older than the maximum 21 days allowed.).   Wt Readings from Last 3 Encounters:  07/09/21 151 lb (68.5  kg)  06/28/21 152 lb (68.9 kg)  03/29/21 150 lb 8 oz (68.3 kg)     Other studies reviewed: Additional studies/records reviewed today include: summarized above  ASSESSMENT AND PLAN:  1. Paroxysmal Afib     CHA2DS2Vasc is *** 2 (age, gender)     S/p ablation, off a/c           2. SVT     S/p 2 negative EP studies     Symptom triggered monitor findings associate with NSR, noting only very brief SVT without symptoms     ***       Disposition: ***  Current medicines are reviewed at length with the patient today.  The patient did not have any concerns regarding medicines.  Venetia Night, PA-C 08/05/2021 10:37 AM     [facility] [place] [address] [phone] (office)  [phone] (fax)

## 2021-08-06 DIAGNOSIS — L720 Epidermal cyst: Secondary | ICD-10-CM | POA: Diagnosis not present

## 2021-08-06 DIAGNOSIS — L650 Telogen effluvium: Secondary | ICD-10-CM | POA: Diagnosis not present

## 2021-08-09 ENCOUNTER — Ambulatory Visit (INDEPENDENT_AMBULATORY_CARE_PROVIDER_SITE_OTHER): Payer: Medicare Other | Admitting: Internal Medicine

## 2021-08-09 ENCOUNTER — Encounter: Payer: Self-pay | Admitting: Internal Medicine

## 2021-08-09 ENCOUNTER — Other Ambulatory Visit: Payer: Self-pay

## 2021-08-09 DIAGNOSIS — L989 Disorder of the skin and subcutaneous tissue, unspecified: Secondary | ICD-10-CM | POA: Diagnosis not present

## 2021-08-09 DIAGNOSIS — R200 Anesthesia of skin: Secondary | ICD-10-CM | POA: Diagnosis not present

## 2021-08-09 DIAGNOSIS — R202 Paresthesia of skin: Secondary | ICD-10-CM

## 2021-08-09 DIAGNOSIS — G935 Compression of brain: Secondary | ICD-10-CM | POA: Diagnosis not present

## 2021-08-09 DIAGNOSIS — Z0000 Encounter for general adult medical examination without abnormal findings: Secondary | ICD-10-CM

## 2021-08-09 NOTE — Assessment & Plan Note (Signed)
Advised Tdap, she will get it next time.

## 2021-08-09 NOTE — Patient Instructions (Addendum)
Sheri [name] -   Thank you for coming in today.   We will keep an eye on the lesion on your back (macule).   Please consider getting the tetanus at next visit.   Thank you!

## 2021-08-09 NOTE — Assessment & Plan Note (Signed)
Appears stable, scab resolved.  Monitor yearly.

## 2021-08-09 NOTE — Assessment & Plan Note (Signed)
Last imaging from 2007 was CT head and was stable.  Discussed today, no new symptoms.

## 2021-08-09 NOTE — Progress Notes (Signed)
° °  Subjective:    Patient ID: Sheri [name], female    DOB: September 21, 1950, 71 y.o.   MRN: [id number]  6 week follow up for weakness of the leg and skin lesion on back.   HPI  Sheri [name] is a 71 year old woman with PMH of vertigo, PSVT followed by cardiology, OP, Depression who presents for follow up.   Today, we met to discuss her concerns about numbness brought up at last visit.  We also discussed her skin lesion on the back, concerns that it was growing and changing in color, however, today, she notes that it is the same size and not bothering her.  For her numbness she has monitored when the symptoms come on and it seems to be when she sits on a hard surface (toilet) and then stands up.  It goes away after a couple of moments.   Review of Systems  Constitutional:  Negative for activity change, appetite change and fatigue.  Respiratory:  Negative for cough and shortness of breath.   Genitourinary:  Negative for difficulty urinating and dysuria.  Skin:  Positive for color change (small nevus vs. seb K on the back, no flesh toned, 27mm X 77mm, smooth borders). Negative for wound.      Objective:   Physical Exam Vitals and nursing note reviewed.  Constitutional:      General: She is not in acute distress.    Appearance: Normal appearance. She is not ill-appearing.  HENT:     Head: Normocephalic and atraumatic.  Pulmonary:     Effort: Pulmonary effort is normal. No respiratory distress.  Abdominal:     General: Abdomen is flat.  Musculoskeletal:        General: No swelling or tenderness.  Skin:    General: Skin is warm and dry.     Comments: Small flesh colored nevus vs. Seb K (more likely) on the back, changed to skin colored compared to small scab at last visit.  41mmX5mm unchanged.  Will continue to monitor.   Neurological:     General: No focal deficit present.     Mental Status: She is alert and oriented to person, place, and time. Mental status is at baseline.   No labs today.         Assessment & Plan:  Return in 3 months.

## 2021-08-09 NOTE — Assessment & Plan Note (Signed)
Related to sitting on hard services.  Return precautions given.

## 2021-08-12 ENCOUNTER — Ambulatory Visit: Payer: Medicare Other | Admitting: Physician Assistant

## 2021-08-15 ENCOUNTER — Other Ambulatory Visit: Payer: Self-pay

## 2021-08-15 ENCOUNTER — Encounter: Payer: Self-pay | Admitting: Cardiology

## 2021-08-15 ENCOUNTER — Ambulatory Visit (INDEPENDENT_AMBULATORY_CARE_PROVIDER_SITE_OTHER): Payer: Medicare Other | Admitting: Cardiology

## 2021-08-15 VITALS — BP 108/68 | HR 87 | Ht 65.5 in | Wt 152.2 lb

## 2021-08-15 DIAGNOSIS — I471 Supraventricular tachycardia: Secondary | ICD-10-CM

## 2021-08-15 MED ORDER — PROPRANOLOL HCL 20 MG PO TABS: 20.0000 mg | ORAL_TABLET | Freq: Two times a day (BID) | ORAL | 3 refills | Status: DC

## 2021-08-15 NOTE — Progress Notes (Signed)
Electrophysiology Office Note   Date:  08/15/2021   ID:  GIAVANA ROOKE, DOB 1950/08/20, MRN [id number]  PCP:  Sid Falcon, MD  Cardiologist:  Debara Pickett Primary Electrophysiologist:  Aaleah Hirsch [name], MD    No chief complaint on file.    History of Present Illness: Sheri [name] is a 71 y.o. female who presents today for electrophysiology evaluation.     She has a history STEMI for atrial fibrillation and SVT.  She is status post atrial fibrillation ablation 10/12/2015.  She has had multiple episodes of SVT and has had negative EP studies on 07/25/2020 and 10/26/2020.  After one of her EP studies, she went into SVT in the holding area and received metoprolol.  Today, denies symptoms of palpitations, chest pain, shortness of breath, orthopnea, PND, lower extremity edema, claudication, dizziness, presyncope, syncope, bleeding, or neurologic sequela. The patient is tolerating medications without difficulties.  She is continued to have episodic palpitations.  Her cardiac monitor shows sinus rhythm with a single PAC when she is triggering symptoms.  In between these episodes she feels well and is without complaint.  She is quite upset about her symptoms.  Past Medical History:  Diagnosis Date   AF (atrial fibrillation) ([place]) 10/12/2015   Allergic rhinitis    Anxiety    ANXIETY DISORDER, GENERALIZED 08/18/2006   Qualifier: Diagnosis of  By: [name] MD, [name]     Arthritis    no per pt   Atrial fibrillation with RVR ([place])    Cancer ([place])    left lower eyelid, basal cell cancer   Chiari I malformation ([place]) 03/07/2016   pt unsure of this   Chronic sinusitis    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   COPD (chronic obstructive pulmonary disease) ([place])    no per pt   Depression    treated at [facility]   GERD (gastroesophageal reflux disease)    Goiter    hx of goiter   Hx of atrial tachycardia since age 16 03/15/2013   Memory loss 12/31/2016   Mnire's disease 03/07/2016    Palpitations    Associated with difficulty breathing   Panic attacks    Poisoning by selective serotonin reuptake inhibitors(969.03) Jan 2007   severe reaction with MAOI (nardil) and dextromethorphan   Postmenopausal 12/31/2016   SVT (supraventricular tachycardia) ([place]) 03/15/2013   History of SVT s/p afib ablation, 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 Following with cardiology.    Swollen gland 09/14/2018   Tachycardia    Tonsillitis 11/12/2018   Past Surgical History:  Procedure Laterality Date   APPENDECTOMY     COLONOSCOPY     ELECTROPHYSIOLOGIC STUDY N/A 10/12/2015   Procedure: Atrial Fibrillation Ablation;  Surgeon: Torah Pinnock [name], MD;  Location: [facility];  Service: Cardiovascular;  Laterality: N/A;   LID LESION EXCISION Left 08/06/2017   Procedure: EXCISION BIOPSY OF EYELID, RECONSTRUCTION OF EYELID;  Surgeon: [name], MD;  Location: [facility];  Service: Ophthalmology;  Laterality: Left;   OVARIAN CYST REMOVAL  09/1967   left ovarian cystectomy- 17.5 lb cyst removed- per pt   right mastoidectomy     ET tube placement in 1980 and 1990   right sinus surgery  october 2000   SVT ABLATION N/A 07/25/2020   Procedure: SVT ABLATION;  Surgeon: [name], MD;  Location: [facility];  Service: Cardiovascular;  Laterality: N/A;   SVT ABLATION N/A 10/26/2020   Procedure: SVT ABLATION;  Surgeon: [name], MD;  Location: [facility];  Service: Cardiovascular;  Laterality: N/A;   THYROIDECTOMY, PARTIAL     nodule removal - April 1996     Current Outpatient Medications  Medication Sig Dispense Refill   albuterol (VENTOLIN HFA) 108 (90 Base) MCG/ACT inhaler Inhale 2 puffs into the lungs every 6 (six) hours as needed for wheezing or shortness of breath. 8 g 2   alendronate (FOSAMAX) 70 MG tablet Take 1 tablet (70 mg total) by mouth every 7 (seven) days. Take with a full glass of water on an empty stomach. 4 tablet 11   meclizine  (ANTIVERT) 25 MG tablet TAKE 1 TABLET BY MOUTH THREE TIMES DAILY AS NEEDED FOR DIZZINESS 20 tablet 0   QUEtiapine (SEROQUEL) 200 MG tablet Take 100 mg by mouth at bedtime.     traZODone (DESYREL) 100 MG tablet Take 100 mg by mouth at bedtime.     venlafaxine XR (EFFEXOR-XR) 75 MG 24 hr capsule Take 150 mg by mouth daily.     propranolol (INDERAL) 20 MG tablet Take 1 tablet (20 mg total) by mouth 2 (two) times daily. 180 tablet 3   No current facility-administered medications for this visit.    Allergies:   Guaifenesin   Social History:  The patient  reports that she has never smoked. She has never used smokeless tobacco. She reports that she does not drink alcohol and does not use drugs.   Family History:  The patient's family history includes Atrial fibrillation in her sister and sister; Breast cancer (age of onset: 32) in her niece; Heart attack in her father; Hypertension in her father and mother; Psoriasis in her father.   ROS:  Please see the history of present illness.   Otherwise, review of systems is positive for none.   All other systems are reviewed and negative.   PHYSICAL EXAM: VS:  BP 108/68    Pulse 87    Ht 5' 5.5" (1.664 m)    Wt 152 lb 3.2 oz (69 kg)    SpO2 94%    BMI 24.94 kg/m  , BMI Body mass index is 24.94 kg/m. GEN: Well nourished, well developed, in no acute distress  HEENT: normal  Neck: no JVD, carotid bruits, or masses Cardiac: RRR; no murmurs, rubs, or gallops,no edema  Respiratory:  clear to auscultation bilaterally, normal work of breathing GI: soft, nontender, nondistended, + BS MS: no deformity or atrophy  Skin: warm and dry Neuro:  Strength and sensation are intact Psych: euthymic mood, full affect  EKG:  EKG is not ordered today. Personal review of the ekg ordered 07/09/21 shows sinus rhythm, rate 83  Recent Labs: 07/09/2021: BUN 14; Creatinine, Ser 0.83; Hemoglobin 13.8; Platelets 370; Potassium 4.2; Sodium 141; TSH 4.010    Lipid Panel      Component Value Date/Time   CHOL 111 03/16/2013 0355   TRIG 70 03/16/2013 0355   HDL 39 (L) 03/16/2013 0355   CHOLHDL 2.8 03/16/2013 0355   VLDL 14 03/16/2013 0355   LDLCALC 58 03/16/2013 0355     Wt Readings from Last 3 Encounters:  08/15/21 152 lb 3.2 oz (69 kg)  08/09/21 152 lb 9.6 oz (69.2 kg)  07/09/21 151 lb (68.5 kg)      Other studies Reviewed: Additional studies/ records that were reviewed today include: TTE 2014  Review of the above records today demonstrates:  - Left ventricle: The cavity size was normal. Wall thickness   was  increased in a pattern of mild LVH. Systolic function   was normal. The estimated ejection fraction was in the   range of 55% to 60%. Wall motion was normal; there were no   regional wall motion abnormalities. Left ventricular   diastolic function parameters were normal. - Left atrium: The atrium was normal in size. - Tricuspid valve: Poorly visualized. Trivial regurgitation. - Pulmonary arteries: PA peak pressure: 73mm Hg (S). - Inferior vena cava: The vessel was normal in size; the   respirophasic diameter changes were in the normal range    50%); findings are consistent with normal central venous   pressure.  Cardiac monitor 07/27/2021 personally reviewed Predominant underlying rhythm was sinus rhythm 8 SVT runs occurred, longest lasting 16 beats Less than 1% ventricular and supraventricular ectopy Triggered episodes associated with sinus rhythm  ASSESSMENT AND PLAN:  1.  Paroxysmal atrial fibrillation: Currently on propranolol.  CHA2DS2-VASc of 2.  Status post ablation 10/12/2015.  No further episodes of atrial fibrillation and thus not anticoagulated.  2.  SVT: Has responded to adenosine.  She is unfortunately had negative EP study x2.  She did go into SVT immediately after her EP study.  Currently on propranolol 10 mg twice daily.  Has continued to have symptoms, though her monitor does not show arrhythmia.  We Navina Wohlers increase  propranolol to 20 mg twice daily.  Current medicines are reviewed at length with the patient today.   The patient does not have concerns regarding her medicines.  The following changes were made today: Increase propranolol  Labs/ tests ordered today include:  No orders of the defined types were placed in this encounter.    Disposition:   FU with Xanthe Couillard 6 months  Signed, Danyia Borunda [name], MD  08/15/2021 3:01 PM     [place] [address] [address] [phone] (office) [phone] (fax)

## 2021-08-15 NOTE — Patient Instructions (Signed)
Medication Instructions:  Your physician has recommended you make the following change in your medication:   INCREASE: Propranolol to 20mg  twice daily  *If you need a refill on your cardiac medications before your next appointment, please call your pharmacy*   Lab Work: None If you have labs (blood work) drawn today and your tests are completely normal, you will receive your results only by: [place] (if you have MyChart) OR A paper copy in the mail If you have any lab test that is abnormal or we need to change your treatment, we will call you to review the results.   Follow-Up: At [facility], you and your health needs are our priority.  As part of our continuing mission to provide you with exceptional heart care, we have created designated Provider Care Teams.  These Care Teams include your primary Cardiologist (physician) and Advanced Practice Providers (APPs -  Physician Assistants and Nurse Practitioners) who all work together to provide you with the care you need, when you need it.  Your next appointment:   6 month(s)  The format for your next appointment:   In Person  Provider:   Allegra Lai, MD

## 2021-09-30 ENCOUNTER — Other Ambulatory Visit: Payer: Self-pay | Admitting: Cardiology

## 2021-10-18 ENCOUNTER — Ambulatory Visit (INDEPENDENT_AMBULATORY_CARE_PROVIDER_SITE_OTHER): Payer: Medicare Other | Admitting: Internal Medicine

## 2021-10-18 VITALS — BP 104/61 | HR 74 | Temp 98.2°F | Ht 65.5 in | Wt 159.8 lb

## 2021-10-18 DIAGNOSIS — H8109 Meniere's disease, unspecified ear: Secondary | ICD-10-CM | POA: Diagnosis not present

## 2021-10-18 DIAGNOSIS — R635 Abnormal weight gain: Secondary | ICD-10-CM | POA: Diagnosis not present

## 2021-10-18 DIAGNOSIS — K648 Other hemorrhoids: Secondary | ICD-10-CM | POA: Diagnosis not present

## 2021-10-18 DIAGNOSIS — I471 Supraventricular tachycardia, unspecified: Secondary | ICD-10-CM

## 2021-10-18 DIAGNOSIS — Z0000 Encounter for general adult medical examination without abnormal findings: Secondary | ICD-10-CM

## 2021-10-18 DIAGNOSIS — K644 Residual hemorrhoidal skin tags: Secondary | ICD-10-CM | POA: Diagnosis not present

## 2021-10-18 DIAGNOSIS — F3341 Major depressive disorder, recurrent, in partial remission: Secondary | ICD-10-CM

## 2021-10-18 DIAGNOSIS — M810 Age-related osteoporosis without current pathological fracture: Secondary | ICD-10-CM | POA: Diagnosis not present

## 2021-10-18 MED ORDER — HYDROCORTISONE 2.5 % EX CREA: TOPICAL_CREAM | Freq: Every day | CUTANEOUS | 2 refills | Status: DC | PRN

## 2021-10-18 NOTE — Assessment & Plan Note (Signed)
She is regularly symptomatic and is using meclizine.  Currently doing okay from this issue.  ? ?Plan ?Continue PRN meclizine, refill as needed.  ?

## 2021-10-18 NOTE — Patient Instructions (Addendum)
Ms. Andrus - -  ? ?Thank you for coming in to see me today! ? ?For your weight gain - I have provided you with some handouts to look over and maybe help with better food choices.  ? ?I will also place a consult for you to see Ms. Debera Lat, our nutrition specialist.  ? ?We will also have you scheduled for a PAP smear to update your screening.  ? ?THank you! ?

## 2021-10-18 NOTE — Assessment & Plan Note (Signed)
She notes interest in updating her PAP smear.  She had one in 2016 at last testing, but did not have co-testing.  She tells me today that she has never had an abnormal pap smear, but would feel better updating her PAP smear.  ? ?We will do this at next visit in 1 month.  ?

## 2021-10-18 NOTE — Assessment & Plan Note (Signed)
Discussed today.  She is concerned, has some maladaptive eating patterns.  We discussed medication and she would prefer nutrition management and discussion.   ? ?Plan ?Referral to in clinic nutrition.  ?

## 2021-10-18 NOTE — Assessment & Plan Note (Signed)
Reviewed her last DEXA scan and she has osteoporosis in her femur and osteopenia in other sites.  She is on alendronate without issue.  She is taking them once per week and with a full glass of water.   ?

## 2021-10-18 NOTE — Progress Notes (Signed)
? ?  Subjective:  ? ? Patient ID: Sheri [name], female    DOB: 06/07/1951, 71 y.o.   MRN: [id number] ? ?CC: follow up for SVT, MDD ? ?HPI ? ?Ms. Schimek is a 71 year old woman with PMH of SVT, Meniere's, osteoporosis, MDD, hemorrhoids and a recent issues with weight gain.  ? ?She notes that since she has not been working since December, she has been eating a lot more.  She is eating ice cream 1-3 times per day and when she gets up overnight, she will eat a cookie or 2.  She notes being at the heaviest weight of her life.  This is distressing to her.  We discussed options and she would like to try lifestyle changes at this time.  ? ?We discussed her hemorrhoids and she notes that the pain has improved since her diarrhea is improved (Treated with metronidazole).  However, she still has occasional straining and pain.  She is interested in treatment.  ? ?We discussed her PAP smear history.  She has never had an abnormal.  Her last screening was about 7 years ago, prior to her turning 71.  She is open to having another PAP smear with co testing and then graduating from screening.  She does not have any issues with return of bleeding, pelvic pain or dryness.   ? ?Review of Systems  ?Constitutional:  Positive for unexpected weight change. Negative for activity change, appetite change and fatigue.  ?Respiratory:  Negative for cough and shortness of breath.   ?Cardiovascular:  Negative for chest pain and leg swelling.  ?Gastrointestinal:  Positive for rectal pain. Negative for abdominal distention and abdominal pain.  ?Genitourinary:  Negative for difficulty urinating, dysuria, vaginal bleeding and vaginal discharge.  ?Neurological:  Positive for dizziness (chronic).  ?Psychiatric/Behavioral:  Negative for decreased concentration and dysphoric mood.   ? ?   ?Objective:  ? Physical Exam ?Vitals and nursing note reviewed.  ?Constitutional:   ?   General: She is not in acute distress. ?   Appearance: Normal appearance. She is not  toxic-appearing.  ?Cardiovascular:  ?   Rate and Rhythm: Normal rate.  ?   Heart sounds: Normal heart sounds. No murmur heard. ?Neurological:  ?   Mental Status: She is alert. Mental status is at baseline.  ?Psychiatric:     ?   Mood and Affect: Mood normal.     ?   Behavior: Behavior normal.  ? ? ?No labs needed today ? ? ?   ?Assessment & Plan:  ?F/U in 1 month for PAP smear and to meet with Nutrition ? ?

## 2021-10-18 NOTE — Assessment & Plan Note (Signed)
Discussed with the patient.  She has recently seen them and had a monitor.  During that time there was occasional SVT (reviewed in the chart) and her symptomatic times showed NSR.  ? ?She has been uptitrated on her propranolol and has not had very many symptoms since that time.   ? ?Continue follow up with Cardiology ?Continue propranolol ?

## 2021-10-18 NOTE — Assessment & Plan Note (Signed)
She notes occasional issues with pain from her hemorrhoids.  She is interested in therapy.  No blood loss that she reported today.  ? ?Plan ?Rx for hydrocortisone 2.5% once a day PRN for hemorrhoidal pain ?

## 2021-10-18 NOTE — Assessment & Plan Note (Signed)
She follows with psychiatry.  She is on a chronic therapy regimen and she will continue on these.  ?

## 2021-10-22 ENCOUNTER — Telehealth: Payer: Self-pay | Admitting: *Deleted

## 2021-10-22 NOTE — Telephone Encounter (Signed)
Call from patient staes she has a sore throat.  Fells like her throat is swollen .  Has progressed over the las 3 days.  Said she does not have a fever.  Has not taken a Covid Test.  Has a cough that started out as white now is yellowish.  Has gargled with salt water and taken Ada.. Has some congestion.  Was given an appointment for tomorrow morning. Offered option of going to Urgent Care since her throat is swollen and she has pain when swallowing.  Patient stated that she will go to Urgent Care.  ?

## 2021-10-23 ENCOUNTER — Other Ambulatory Visit: Payer: Self-pay

## 2021-10-23 ENCOUNTER — Encounter: Payer: Self-pay | Admitting: Internal Medicine

## 2021-10-23 ENCOUNTER — Ambulatory Visit (INDEPENDENT_AMBULATORY_CARE_PROVIDER_SITE_OTHER): Payer: Medicare Other | Admitting: Internal Medicine

## 2021-10-23 VITALS — BP 108/52 | HR 85 | Temp 97.5°F | Ht 65.5 in | Wt 162.4 lb

## 2021-10-23 DIAGNOSIS — J029 Acute pharyngitis, unspecified: Secondary | ICD-10-CM | POA: Diagnosis not present

## 2021-10-23 DIAGNOSIS — R059 Cough, unspecified: Secondary | ICD-10-CM | POA: Diagnosis not present

## 2021-10-23 NOTE — Progress Notes (Signed)
Internal Medicine Clinic Attending ? ?Case discussed with Dr. Dean  At the time of the visit.  We reviewed the resident?s history and exam and pertinent patient test results.  I agree with the assessment, diagnosis, and plan of care documented in the resident?s note.  ?

## 2021-10-23 NOTE — Progress Notes (Signed)
? ?  CC: cough, sore throat, swollen glands ? ?HPI: ? ?[name] is a 71 y.o. female with past medical history as detailed below who presents with 4 days of cough, sore throat, and feeling like her glands are swollen. Please see problem-based charting for detailed assessment and plan. ? ?Past Medical History:  ?Diagnosis Date  ? AF (atrial fibrillation) ([place]) 10/12/2015  ? Allergic rhinitis   ? Anxiety   ? ANXIETY DISORDER, GENERALIZED 08/18/2006  ? Qualifier: Diagnosis of  By: [name] MD, [name]    ? Arthritis   ? no per pt  ? Atrial fibrillation with RVR ([place])   ? Cancer [facility])   ? left lower eyelid, basal cell cancer  ? Chiari I malformation ([place]) 03/07/2016  ? pt unsure of this  ? Chronic sinusitis   ? 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  ? COPD (chronic obstructive pulmonary disease) ([place])   ? no per pt  ? Depression   ? treated at [facility]  ? GERD (gastroesophageal reflux disease)   ? Goiter   ? hx of goiter  ? Hx of atrial tachycardia since age 33 03/15/2013  ? Memory loss 12/31/2016  ? M?ni?re's disease 03/07/2016  ? Palpitations   ? Associated with difficulty breathing  ? Panic attacks   ? Poisoning by selective serotonin reuptake inhibitors(969.03) Jan 2007  ? severe reaction with MAOI (nardil) and dextromethorphan  ? Postmenopausal 12/31/2016  ? SVT (supraventricular tachycardia) ([place]) 03/15/2013  ? 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 Following with cardiology.   ? Swollen gland 09/14/2018  ? Tachycardia   ? Tonsillitis 11/12/2018  ? ?Review of Systems:   ?Review of Systems  ?Constitutional:  Negative for chills, diaphoresis, fever and malaise/fatigue.  ?HENT:  Positive for sore throat. Negative for congestion, ear pain and sinus pain.   ?Respiratory:  Positive for cough. Negative for shortness of breath.   ?Gastrointestinal:  Negative for abdominal pain, constipation, diarrhea, nausea and vomiting.  ?Musculoskeletal:  Negative for joint pain  and myalgias.  ?Skin:  Negative for rash.  ?Neurological:  Negative for headaches.  ? ? ?Physical Exam: ? ?Vitals:  ? 10/23/21 0849  ?BP: (!) 108/52  ?Pulse: 85  ?Temp: (!) 97.5 ?F (36.4 ?C)  ?TempSrc: Oral  ?SpO2: 98%  ?Weight: 162 lb 6.4 oz (73.7 kg)  ?Height: 5' 5.5" (1.664 m)  ? ?Constitutional:Sitting comfortably in exam room, no acute distress noted. ?HENT: Ears: bilateral TM and canal normal without edema or erythema, no drainage or tenderness, no cerumen impaction. Nose: No enlargement of nasal turbinates bilaterally, no drainage. Mouth/throat: Moist oral mucosa, uvula midline without edema or erythema, pharynx is erythematous without exudate or edema, tonsils are without exudate. ?Neck:Bilateral tender cervical lymphadenopathy. ?Cardio:Regular rate and rhythm. No murmurs, rubs, gallops. ?Pulm:Clear to auscultation bilaterally. ?Abdomen:Soft, nontender, nondistended. ?Skin:Warm and dry without rash or lesions on limited skin exam. ?Neuro:Alert and oriented x3. No focal deficit noted. ?Psych:Normal mood and affect. ? ? ?Assessment & Plan:  ? ?See Encounters Tab for problem based charting. ? ?Patient discussed with Dr. Jimmye Norman  ?

## 2021-10-23 NOTE — Patient Instructions (Addendum)
Sheri [name], ? ?It was a pleasure to care for you today. I am sorry that you are feeling poorly! As we discussed, I feel that you have a viral infection causing your symptoms. I have sent a swab to test for COVID-19, the flu, and RSV. In the meantime, I recognize that you take it easy at home until you feel better and we get these results back. You can take cold and flu medicine over the counter, drink warm beverages with honey, and use throat soothing/cough drops. ? ?Remember: If you have any questions or concerns, please call our clinic at [phone] between 9am-5pm and after hours call [phone] and ask for the internal medicine resident on call. If you feel you are having a medical emergency please call 911. ? ?Farrel Gordon, DO ? ?

## 2021-10-23 NOTE — Assessment & Plan Note (Signed)
Sheri [name] presents with 4 days of sore throat, swollen and painful lymph nodes, sore throat, and minor sneezing. She denies fever, chills, diaphoresis, headaches, body aches, GI upset, joint pains. She reports pain swollen sensation with swallowing and talking but is not having difficulty maintaining her airway, secretions, or swallowing overall. She has been taking cough and cold medicine which has provided some, but not much, relief. She does not have any new rashes. She has no sick contacts and her only recent child exposure was her granddaughter last weekend who did have pink eye but no symptoms like her. She is not vaccinated for influenza or COVID-19. She had a mild cause of COVID-19 in September 2022. ?Assessment: Centor Criteria score is 0. Given history of illness and physical exam I believe Sheri [name] to have a viral pharyngitis. She is able to maintain airway and secretions and has no tripoding or muffled voice necessitating hospitalization. ?Plan: Nasal swab for COVID-19, influenza, and RSV sent out. Patient counseled on staying at home until she feels better and we have test results back. Counseled her on symptomatic management including cold and flu medication, cough drops and throat lozenges, drinking warm beverages with honey. I have advised her that if she begins to feel worse or is no better by the start of next week to contact our office. ?

## 2021-10-25 LAB — COVID-19, FLU A+B AND RSV
Influenza A, NAA: NOT DETECTED
Influenza B, NAA: NOT DETECTED
RSV, NAA: NOT DETECTED
SARS-CoV-2, NAA: NOT DETECTED

## 2021-10-29 ENCOUNTER — Telehealth: Payer: Self-pay | Admitting: Internal Medicine

## 2021-10-29 NOTE — Telephone Encounter (Signed)
Error

## 2021-10-31 ENCOUNTER — Telehealth: Payer: Self-pay | Admitting: Dietician

## 2021-10-31 ENCOUNTER — Telehealth: Payer: Self-pay | Admitting: *Deleted

## 2021-10-31 NOTE — Telephone Encounter (Addendum)
Spoke with [name] about referral for nutrition for weight gain and that I do not think that Medical Nutrition Therapy is a covered service for her diagnosis. We arranged a time to discuss in more detail next Tuesday.  ?

## 2021-10-31 NOTE — Telephone Encounter (Signed)
Call from pt stating she has been waiting for a week for the doctor to call her about covid test result. Stated she has been sick for 10 days with cough, sorethroat, and swollen glands. Stated the doctor at the last [facility] on 4/5 told her,  she might have a viral infection but did not explain what this was ( she had to look it up). Stated the doctor did not give her any medication; she had to self-medicate herself and ended up going to UC on Saturday. She did not get better until yesterday. Stated she and her husband prefer not to see this doctor anymore; she wants this noted on her chart. Stated she was very sick and the doctor did not seem to care.Pt was informed her covid test is negative. ?

## 2021-11-05 ENCOUNTER — Encounter: Payer: Medicare Other | Admitting: Dietician

## 2021-11-12 ENCOUNTER — Encounter: Payer: Self-pay | Admitting: Internal Medicine

## 2021-11-12 ENCOUNTER — Ambulatory Visit (INDEPENDENT_AMBULATORY_CARE_PROVIDER_SITE_OTHER): Payer: Medicare Other | Admitting: Internal Medicine

## 2021-11-12 ENCOUNTER — Other Ambulatory Visit (HOSPITAL_COMMUNITY)
Admission: RE | Admit: 2021-11-12 | Discharge: 2021-11-12 | Disposition: A | Discharge status: Home / Self Care | Payer: Medicare Other | Source: Ambulatory Visit | Attending: Internal Medicine | Admitting: Internal Medicine

## 2021-11-12 ENCOUNTER — Other Ambulatory Visit: Payer: Self-pay

## 2021-11-12 VITALS — BP 113/69 | HR 84 | Temp 98.1°F | Ht 65.5 in | Wt 159.8 lb

## 2021-11-12 DIAGNOSIS — Z1151 Encounter for screening for human papillomavirus (HPV): Secondary | ICD-10-CM | POA: Insufficient documentation

## 2021-11-12 DIAGNOSIS — N898 Other specified noninflammatory disorders of vagina: Secondary | ICD-10-CM | POA: Diagnosis present

## 2021-11-12 DIAGNOSIS — R1903 Right lower quadrant abdominal swelling, mass and lump: Secondary | ICD-10-CM | POA: Insufficient documentation

## 2021-11-12 DIAGNOSIS — R1031 Right lower quadrant pain: Secondary | ICD-10-CM | POA: Diagnosis not present

## 2021-11-12 DIAGNOSIS — Z01419 Encounter for gynecological examination (general) (routine) without abnormal findings: Secondary | ICD-10-CM | POA: Diagnosis not present

## 2021-11-12 NOTE — Progress Notes (Signed)
? ?  Subjective:  ? ? Patient ID: Sheri [name], female    DOB: [birth year], 71 y.o.   MRN: [id number] ? ?1 month follow up for PAP smear ? ?HPI ? ?Ms. Mikkelsen is a 71 year old woman with PMH of OP, SVT, meniere's disease, MDD w ho presents for follow up and for PAP smear.  She notes recent (3-4 weeks) bleeding from a spot, open area inside her vagina.  She notes occasionally bright red blood, occasional spotting.  She also reports associated tenderness.  She is not sure if she scratched something.  She notes no penetrative sexual encounters.  ? ?She further reports swelling of her abdomen which is asymmetric and uncomfortable. She is concerned as she had a large ovarian cyst removed from the left side many years ago.  She notes swelling, fullness, asymmetric enlargement on the right lower quadrant and some discomfort.   ? ? ?Review of Systems  ?Constitutional:  Positive for unexpected weight change (gain). Negative for activity change, appetite change and fatigue.  ?Genitourinary:  Positive for vaginal bleeding and vaginal pain. Negative for vaginal discharge.  ? ?   ?Objective:  ? Physical Exam ?Vitals and nursing note reviewed.  ?Constitutional:   ?   Appearance: Normal appearance.  ?Abdominal:  ?   General: Bowel sounds are normal. There is distension (asymmetric on the right, mainly lower quadrant).  ?   Palpations: Abdomen is soft. There is no mass.  ?   Tenderness: There is abdominal tenderness (mild). There is no guarding or rebound.  ?   Hernia: No hernia is present.  ?Genitourinary: ?   Vagina: Erythema, tenderness and bleeding present.  ?   Rectum: Normal. No anal fissure or external hemorrhoid.  ?   Comments: At 11 oclock, anterior vaginal wall, small 41mX3mm lesion, ? Polypoid with thrombosis, no active bleeding.  Very tender to speculum exam, was not able to locate os.  ?Skin: ?   General: Skin is warm and dry.  ?   Comments: Midline abdominal scar, well healed  ?Neurological:  ?   Mental Status: She is alert.   ?Psychiatric:     ?   Mood and Affect: Mood normal.     ?   Behavior: Behavior normal.  ? ? ?PAP sent today, but unable to see os.  ? ? ?   ?Assessment & Plan:  ?Return in 2 months as planned, in the interim will get gynecology evaluation and CT of the abdomen ? ?

## 2021-11-12 NOTE — Patient Instructions (Addendum)
Sheri [name] - - ? ?Thank you for coming in today.  ? ?For your vaginal lesion - please go see gynecology.  ? ?For your swelling in the abdomen, I have ordered a CT scan of your abdomen.  I will call you when we get the results.  ? ?Please come back to see me in 2 months, sooner if needed.  ?

## 2021-11-12 NOTE — Assessment & Plan Note (Signed)
Reviewed surgical history and has history of very large ovarian cyst removed on the left with oophorectomy on that side.  Could be recurrence.  Concern weight gain could be related to abdominal swelling.  She has had her appendix out as well.  ? ?Plan ?CT abdomen with contrast, follow up after scan.  ?

## 2021-11-12 NOTE — Assessment & Plan Note (Signed)
Due to pain, I was not able to get a clear look at the lesion.  It appeared polypoid in nature.  Recommended she see gynecology who could make determination if this lesion needs to be removed, biopsied or monitored.  ? ?Plan ?Referral to gynecology.  ?

## 2021-11-14 ENCOUNTER — Other Ambulatory Visit: Payer: Self-pay | Admitting: Internal Medicine

## 2021-11-14 DIAGNOSIS — H8109 Meniere's disease, unspecified ear: Secondary | ICD-10-CM

## 2021-11-14 LAB — CYTOLOGY - PAP
Comment: NEGATIVE
Diagnosis: NEGATIVE
High risk HPV: NEGATIVE

## 2021-12-02 ENCOUNTER — Ambulatory Visit (HOSPITAL_COMMUNITY): Payer: Medicare Other

## 2021-12-05 ENCOUNTER — Ambulatory Visit (HOSPITAL_COMMUNITY): Payer: Medicare Other

## 2021-12-12 ENCOUNTER — Ambulatory Visit (HOSPITAL_COMMUNITY)
Admission: RE | Admit: 2021-12-12 | Discharge: 2021-12-12 | Disposition: A | Discharge status: Home / Self Care | Payer: Medicare Other | Source: Ambulatory Visit | Attending: Internal Medicine | Admitting: Internal Medicine

## 2021-12-12 DIAGNOSIS — R1031 Right lower quadrant pain: Secondary | ICD-10-CM | POA: Insufficient documentation

## 2021-12-12 DIAGNOSIS — I700 Atherosclerosis of aorta: Secondary | ICD-10-CM | POA: Diagnosis not present

## 2021-12-12 DIAGNOSIS — R109 Unspecified abdominal pain: Secondary | ICD-10-CM | POA: Diagnosis not present

## 2021-12-12 MED ORDER — IOHEXOL 300 MG/ML  SOLN
100.0000 mL | Freq: Once | INTRAMUSCULAR | Status: AC | PRN
  Administered 2021-12-12: 100 mL via INTRAVENOUS

## 2021-12-17 ENCOUNTER — Telehealth: Payer: Self-pay

## 2021-12-17 NOTE — Telephone Encounter (Signed)
Pt is requesting a call back .. she is wanting the results to her CT scan

## 2021-12-19 ENCOUNTER — Telehealth: Payer: Self-pay | Admitting: Internal Medicine

## 2021-12-19 ENCOUNTER — Encounter (INDEPENDENT_AMBULATORY_CARE_PROVIDER_SITE_OTHER): Payer: Self-pay

## 2021-12-19 DIAGNOSIS — N898 Other specified noninflammatory disorders of vagina: Secondary | ICD-10-CM

## 2021-12-19 NOTE — Telephone Encounter (Signed)
Rec'd call from patient.  Per Request the pt's referral was sent to [facility] and she was sch for today.  Pt's states when she presented her Ins they told her they no longer take her ins.  Please advise if another referral can be placed as the pt states she really needs to be seen/   The patient also  stated no one has called her back with her CT results and would like a call back.

## 2021-12-20 NOTE — Telephone Encounter (Signed)
Returned patient's call and reviewed CT scan results.   She continues to have minimal bleeding from the vagina, we reviewed the spot seen on her last pelvic exam.  She would like a referral to OB/Gyn that takes her insurance which I will place today.    She would like her thyroid checked.  We will review weight gain at next visit as well.   Glenda - new referral placed, can you follow up with referral coordinator?    Gilles Chiquito, MD

## 2021-12-20 NOTE — Telephone Encounter (Signed)
Called patient back and LVM.  Will call back today.

## 2021-12-28 ENCOUNTER — Other Ambulatory Visit: Payer: Self-pay | Admitting: Internal Medicine

## 2021-12-28 DIAGNOSIS — H8109 Meniere's disease, unspecified ear: Secondary | ICD-10-CM

## 2022-01-07 ENCOUNTER — Encounter: Payer: Self-pay | Admitting: Obstetrics

## 2022-01-07 ENCOUNTER — Ambulatory Visit (INDEPENDENT_AMBULATORY_CARE_PROVIDER_SITE_OTHER): Payer: Medicare Other | Admitting: Obstetrics

## 2022-01-07 VITALS — BP 110/60 | Ht 65.5 in | Wt 165.2 lb

## 2022-01-07 DIAGNOSIS — N362 Urethral caruncle: Secondary | ICD-10-CM | POA: Diagnosis not present

## 2022-01-07 DIAGNOSIS — N952 Postmenopausal atrophic vaginitis: Secondary | ICD-10-CM

## 2022-01-07 MED ORDER — ESTRADIOL 0.1 MG/GM VA CREA: 1.0000 g | TOPICAL_CREAM | VAGINAL | 2 refills | Status: AC

## 2022-01-07 NOTE — Patient Instructions (Signed)
Have a great year! Please call with any concerns. Don't forget to wear your seatbelt everyday! If you are not signed up on MyChart, please ask [place] how to sign up for it!   In a world where you can be anything, please be kind.   Body mass index is 27.07 kg/m.  A Healthy Lifestyle: Care Instructions Your Care Instructions  A healthy lifestyle can help you feel good, stay at a healthy weight, and have plenty of energy for both work and play. A healthy lifestyle is something you can share with your whole family. A healthy lifestyle also can lower your risk for serious health problems, such as high blood pressure, heart disease, and diabetes. You can follow a few steps listed below to improve your health and the health of your family. Follow-up care is a key part of your treatment and safety. Be sure to make and go to all appointments, and call your doctor if you are having problems. It's also a good idea to know your test results and keep a list of the medicines you take. How can you care for yourself at home? Do not eat too much sugar, fat, or fast foods. You can still have dessert and treats now and then. The goal is moderation. Start small to improve your eating habits. Pay attention to portion sizes, drink less juice and soda pop, and eat more fruits and vegetables. Eat a healthy amount of food. A 3-ounce serving of meat, for example, is about the size of a deck of cards. Fill the rest of your plate with vegetables and whole grains. Limit the amount of soda and sports drinks you have every day. Drink more water when you are thirsty. Eat at least 5 servings of fruits and vegetables every day. It may seem like a lot, but it is not hard to reach this goal. A serving or helping is 1 piece of fruit, 1 cup of vegetables, or 2 cups of leafy, raw vegetables. Have an apple or some carrot sticks as an afternoon snack instead of a candy bar. Try to have fruits and/or vegetables at every meal. Make exercise  part of your daily routine. You may want to start with simple activities, such as walking, bicycling, or slow swimming. Try to be active 30 to 60 minutes every day. You do not need to do all 30 to 60 minutes all at once. For example, you can exercise 3 times a day for 10 or 20 minutes. Moderate exercise is safe for most people, but it is always a good idea to talk to your doctor before starting an exercise program. Keep moving. Mow the lawn, work in the garden, or [employer]. Take the stairs instead of the elevator at work. If you smoke, quit. People who smoke have an increased risk for heart attack, stroke, cancer, and other lung illnesses. Quitting is hard, but there are ways to boost your chance of quitting tobacco for good. Use nicotine gum, patches, or lozenges. Ask your doctor about stop-smoking programs and medicines. Keep trying. In addition to reducing your risk of diseases in the future, you will notice some benefits soon after you stop using tobacco. If you have shortness of breath or asthma symptoms, they will likely get better within a few weeks after you quit. Limit how much alcohol you drink. Moderate amounts of alcohol (up to 2 drinks a day for men, 1 drink a day for women) are okay. But drinking too much can lead to  liver problems, high blood pressure, and other health problems. Family health If you have a family, there are many things you can do together to improve your health. Eat meals together as a family as often as possible. Eat healthy foods. This includes fruits, vegetables, lean meats and dairy, and whole grains. Include your family in your fitness plan. Most people think of activities such as jogging or tennis as the way to fitness, but there are many ways you and your family can be more active. Anything that makes you breathe hard and gets your heart pumping is exercise. Here are some tips: Walk to do errands or to take your child to school or the bus. Go for a family  bike ride after dinner instead of watching TV. Care instructions adapted under license by your healthcare professional. This care instruction is for use with your licensed healthcare professional. If you have questions about a medical condition or this instruction, always ask your healthcare professional. [place] any warranty or liability for your use of this information.

## 2022-01-07 NOTE — Progress Notes (Signed)
Chief Complaint  Patient presents with   Gynecologic Exam    Patient comes in office today with concerns of vaginal growth and states that it has been present for over two months.   Patient Sheri [name] is an 71 y.o. year old [id number] No LMP recorded. Patient is postmenopausal.  who presents for vaginal lesion in consult from PCP Sid Falcon, MD. Patient had felt an area in the vagina which was confirmed on exam with her PCP. She reports this has been going on a few months. She is sexually active but not for several months. She reports occ dysuria if urine hits that area. History of hemorrhoids but no problems with BM. Denies any leaking. Not wearing any liner.   Review of Systems  Constitutional:  Positive for fatigue. Negative for activity change, appetite change, chills, diaphoresis, fever and unexpected weight change.  HENT:  Negative for congestion, dental problem, drooling, ear discharge, ear pain, facial swelling, hearing loss, mouth sores, nosebleeds, postnasal drip, rhinorrhea, sinus pressure, sinus pain, sneezing, sore throat, tinnitus, trouble swallowing and voice change.   Eyes:  Positive for redness. Negative for photophobia, pain, discharge, itching and visual disturbance.  Respiratory:  Positive for shortness of breath and wheezing. Negative for apnea, cough, choking, chest tightness and stridor.   Cardiovascular:  Negative for chest pain, palpitations and leg swelling.  Gastrointestinal:  Negative for abdominal distention, abdominal pain, anal bleeding, blood in stool, constipation, diarrhea, nausea, rectal pain and vomiting.  Endocrine: Negative for cold intolerance, heat intolerance, polydipsia, polyphagia and polyuria.  Genitourinary:  Negative for decreased urine volume, difficulty urinating, dyspareunia, dysuria, enuresis, flank pain, frequency, genital sores, hematuria, menstrual problem, pelvic pain, urgency, vaginal bleeding, vaginal discharge and vaginal pain.   Musculoskeletal:  Negative for arthralgias, back pain, gait problem, joint swelling, myalgias, neck pain and neck stiffness.  Skin:  Negative for color change, pallor, rash and wound.  Allergic/Immunologic: Positive for environmental allergies. Negative for food allergies and immunocompromised state.  Neurological:  Negative for dizziness, tremors, seizures, syncope, facial asymmetry, speech difficulty, weakness, light-headedness, numbness and headaches.  Hematological:  Negative for adenopathy. Does not bruise/bleed easily.  Psychiatric/Behavioral:  Positive for dysphoric mood. Negative for agitation, behavioral problems, confusion, decreased concentration, hallucinations, self-injury, sleep disturbance and suicidal ideas. The patient is nervous/anxious. The patient is not hyperactive.    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   COPD (chronic obstructive pulmonary disease) ([place])    no per pt   Depression    treated at [facility]   GERD (gastroesophageal reflux disease)    Goiter    hx of goiter   Hx of atrial tachycardia since age 65 03/15/2013   Memory loss 12/31/2016   Mnire's disease 03/07/2016   Palpitations    Associated with difficulty breathing   Panic attacks    Poisoning by selective serotonin reuptake inhibitors(969.03) Jan 2007   severe reaction with MAOI (nardil) and dextromethorphan   Postmenopausal 12/31/2016   SVT (supraventricular  tachycardia) ([place]) 03/15/2013   History of SVT s/p afib ablation, 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  Following with cardiology.    Swollen gland 09/14/2018   Tachycardia    Tonsillitis 11/12/2018   Past Surgical History:  Procedure Laterality Date   APPENDECTOMY     COLONOSCOPY     ELECTROPHYSIOLOGIC STUDY N/A 10/12/2015   Procedure: Atrial Fibrillation Ablation;  Surgeon: [name], MD;  Location: [facility];  Service: Cardiovascular;  Laterality: N/A;   LID LESION EXCISION Left 08/06/2017   Procedure: EXCISION BIOPSY OF EYELID, RECONSTRUCTION OF EYELID;  Surgeon: [name], MD;  Location: [facility];  Service: Ophthalmology;  Laterality: Left;   OVARIAN CYST REMOVAL  09/1967   left ovarian cystectomy- 17.5 lb cyst removed- per pt   right mastoidectomy     ET tube placement in 1980 and 1990   right sinus surgery  october 2000   SVT ABLATION N/A 07/25/2020   Procedure: SVT ABLATION;  Surgeon: [name], MD;  Location: [facility];  Service: Cardiovascular;  Laterality: N/A;   SVT ABLATION N/A 10/26/2020   Procedure: SVT ABLATION;  Surgeon: [name], MD;  Location: [facility];  Service: Cardiovascular;  Laterality: N/A;   THYROIDECTOMY, PARTIAL     nodule removal - April 1996   Family History  Problem Relation Age of Onset   Hypertension Mother    Hypertension Father    Psoriasis Father    Heart attack Father    Atrial fibrillation Sister        had a successful a fib ablation   Atrial fibrillation Sister        had a successful a fib ablation   Breast cancer Niece 50   Colon cancer Neg Hx    Esophageal cancer Neg Hx    Stomach cancer Neg Hx    Rectal cancer Neg Hx    Colon polyps Neg Hx    Social History   Socioeconomic History   Marital status: Widowed    Spouse name: Not on file   Number of children: Not on file   Years of education: Not on file   Highest education level: Not on file  Occupational History   Occupation: day care    Comment: works at a day care  Tobacco Use   Smoking status: Never   Smokeless  tobacco: Never  Vaping Use   Vaping Use: Never used  Substance and Sexual Activity   Alcohol use: No    Alcohol/week: 0.0 standard drinks of alcohol   Drug use: No   Sexual activity: Not on file  Other Topics Concern   Not on file  Social History Narrative   Lives alone.   Social Determinants of Health   Financial Resource Strain: Not on file  Food Insecurity: Not on file  Transportation Needs: Not on file  Physical Activity: Not on file  Stress: Not on file  Social Connections: Not on file  Intimate Partner Violence: Not on file    Medicine list and allergies reviewed and updated.    BP 110/60   Ht 5' 5.5" (1.664 m)   Wt 165 lb 3.2 oz (74.9 kg)   BMI 27.07 kg/m  Physical Exam Vitals reviewed. Exam conducted with a chaperone present.  Constitutional:      General: She is not in acute distress.    Appearance: Normal appearance. She is not ill-appearing.  HENT:     Head: Normocephalic  and atraumatic.  Eyes:     Extraocular Movements: Extraocular movements intact.  Cardiovascular:     Rate and Rhythm: Normal rate.  Pulmonary:     Effort: Pulmonary effort is normal. No respiratory distress.  Genitourinary:    General: Normal vulva.     Exam position: Lithotomy position.     Pubic Area: No rash.      Labia:        Right: No rash, tenderness or lesion.        Left: No rash, tenderness or lesion.      Urethra: Urethral lesion (caruncle present) present. No prolapse or urethral swelling.     Vagina: Tenderness (left upper vaginal wall) present. No vaginal discharge or lesions.     Cervix: No discharge, friability, lesion or cervical bleeding.     Uterus: Normal. Not enlarged, not tender and no uterine prolapse.      Adnexa:        Right: No tenderness or fullness.         Left: No tenderness or fullness.            Comments: Atrophic external genitalia; vaginal atrophy Musculoskeletal:        General: Normal range of motion.  Lymphadenopathy:     Lower Body:  No right inguinal adenopathy. No left inguinal adenopathy.  Skin:    General: Skin is warm.  Neurological:     General: No focal deficit present.     Mental Status: She is alert and oriented to person, place, and time. Mental status is at baseline.     Coordination: Coordination normal.  Psychiatric:        Mood and Affect: Mood normal.        Behavior: Behavior normal.        Thought Content: Thought content normal.        Judgment: Judgment normal.    Vaginal atrophy - Plan: US PELVIC COMPLETE WITH TRANSVAGINAL, estradiol (ESTRACE VAGINAL) 0.1 MG/GM vaginal cream  Urethral caruncle - Plan: US PELVIC COMPLETE WITH TRANSVAGINAL, estradiol (ESTRACE VAGINAL) 0.1 MG/GM vaginal cream  No distinct lesion appreciated on exam today except for urethral caruncle We have discussed that this is consistent with estrogen deficiency We have offerd pelvic US given history of ovarian mass and pain on exam; she desires Management options for urogenital atrophy in adult women include lifestyle modification strategies, the use of vaginal moisturizers, and low-dose topical estradiol preparations. Maintaining regular vaginal intercourse provides protection from urogenital atrophy by increasing blood flow to the pelvic organs, an effect that also may occur through masturbation. With vaginal estrogen, risks to patient are generally lower than using systemic estrogen and there are varying levels of hormone depending on the vaginal preparation used. We have recommended a course of estrace . We also recommend water based lubricants like astroglide during intercourse or small amount of vegetable oil.  Return in about 3 months (around 04/09/2022) for med check .

## 2022-01-16 ENCOUNTER — Ambulatory Visit (HOSPITAL_COMMUNITY): Payer: Medicare Other

## 2022-01-17 ENCOUNTER — Encounter: Payer: Self-pay | Admitting: Internal Medicine

## 2022-01-17 ENCOUNTER — Ambulatory Visit (INDEPENDENT_AMBULATORY_CARE_PROVIDER_SITE_OTHER): Payer: Medicare Other | Admitting: Internal Medicine

## 2022-01-17 ENCOUNTER — Other Ambulatory Visit: Payer: Self-pay

## 2022-01-17 VITALS — BP 102/59 | HR 71 | Temp 98.2°F | Ht 65.5 in | Wt 165.7 lb

## 2022-01-17 DIAGNOSIS — I700 Atherosclerosis of aorta: Secondary | ICD-10-CM

## 2022-01-17 DIAGNOSIS — R635 Abnormal weight gain: Secondary | ICD-10-CM | POA: Diagnosis not present

## 2022-01-17 DIAGNOSIS — I83813 Varicose veins of bilateral lower extremities with pain: Secondary | ICD-10-CM | POA: Diagnosis not present

## 2022-01-17 DIAGNOSIS — N952 Postmenopausal atrophic vaginitis: Secondary | ICD-10-CM | POA: Diagnosis not present

## 2022-01-17 NOTE — Assessment & Plan Note (Signed)
Seen on imaging June 2023.  Will monitor and assess for complications yearly.

## 2022-01-17 NOTE — Assessment & Plan Note (Signed)
Weight gain was the main thing she was concerned about today.  Fortunately, her abdominal pain has resolved and her CT scan was reassuring overall.  She notes decreased activity and poor eating habits as the cause for the weight gain.  I offered consultation with our nutritionist today, and she declined, saying she knows what she needs to do.

## 2022-01-17 NOTE — Assessment & Plan Note (Signed)
She notes interest in getting a consultation with a vascular/vein specialist.  We discussed cost of possible therapy and whether insurance would pay for it.  I advised her to discuss further with their team.   Plan Refer to [facility] (patient preference)

## 2022-01-17 NOTE — Patient Instructions (Signed)
Sheri [name] - -  THank you for coming in today.  I have referred you to the [facility].  Please follow up with them for further discussion.   Thank you!  Come back in 3-4 months.

## 2022-01-17 NOTE — Progress Notes (Signed)
Established Patient Office Visit  Subjective   Patient ID: Sheri [name], female    DOB: Dec 14, 1950  Age: 71 y.o. MRN: [id number]  Chief Complaint  Patient presents with   Follow-up    Sheri [name] presents for follow up of abdominal pain, swelling and vaginal bleeding.   She had a CT scan of the abdomen which did not show any anatomical reason for her symptoms.  She notes today that her symptoms have resolved.   For the vaginal bleeding and lesion, she went to see OB/Gyn.  The symptoms had resolved prior to the visit and no lesion was still visible.  Based on symptoms, however, Gynecology has ordered an Ultrasound of the pelvis which is reasonable.    She has a new complaint today.  She notes that she has had "spider veins" and enlarged veins in her legs all her life.  She has had sclerotherapy for them in the past (1990s).  She has some pain in her larger varicosities at times, particularly on the back of the right knee.  She would like to be referred to the vein specialists which was done today.   Incidental findings found on her CT scan were atherosclerosis of the aorta and fatty infiltration of the liver.     Past Medical History:  Diagnosis Date   AF (atrial fibrillation) ([place]) 10/12/2015   Allergic rhinitis    Anxiety    ANXIETY DISORDER, GENERALIZED 08/18/2006   Qualifier: Diagnosis of  By: [name] MD, [name]     Arthritis    no per pt   Atrial fibrillation with RVR ([place])    Cancer ([place])    left lower eyelid, basal cell cancer   Chiari I malformation ([place]) 03/07/2016   pt unsure of this   Chronic sinusitis    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   COPD (chronic obstructive pulmonary disease) ([place])    no per pt   Depression    treated at [facility]   GERD (gastroesophageal reflux disease)    Goiter    hx of goiter   Hx of atrial tachycardia since age 4 03/15/2013   Memory loss 12/31/2016   Mnire's disease 03/07/2016   Palpitations    Associated  with difficulty breathing   Panic attacks    Poisoning by selective serotonin reuptake inhibitors(969.03) Jan 2007   severe reaction with MAOI (nardil) and dextromethorphan   Postmenopausal 12/31/2016   SVT (supraventricular tachycardia) ([place]) 03/15/2013   History of SVT s/p afib ablation, 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 Following with cardiology.    Swollen gland 09/14/2018   Tachycardia    Tonsillitis 11/12/2018   Allergies  Allergen Reactions   Guaifenesin Palpitations    Review of Systems  Constitutional:  Negative for chills, fever and weight loss.  Gastrointestinal:  Negative for abdominal pain and constipation.  Genitourinary:  Negative for frequency and urgency.       Vaginal bleeding is resolved.   Skin:        She notes spider veins and varicosities, some are worsening, one is painful      Objective:     BP (!) 102/59 (BP Location: Left Arm, Patient Position: Sitting, Cuff Size: Normal)   Pulse 71   Temp 98.2 F (36.8 C)   Ht 5' 5.5" (1.664 m)   Wt 165 lb 11.2 oz (75.2 kg)   SpO2 97% Comment: RA  BMI 27.15 kg/m  BP Readings from Last 3 Encounters:  01/17/22 Marland Kitchen)  102/59  01/07/22 110/60  11/12/21 113/69      Physical Exam Vitals and nursing note reviewed.  Constitutional:      General: She is not in acute distress.    Appearance: Normal appearance. She is normal weight. She is not toxic-appearing.  HENT:     Head: Normocephalic and atraumatic.  Skin:    General: Skin is warm and dry.     Coloration: Skin is not ashen or jaundiced.     Findings: Lesion (She has varicosities in multiple areas, spider veins, moderate varicosity with TTP behind the right knee) present.       Neurological:     Mental Status: She is alert and oriented to person, place, and time. Mental status is at baseline.  Psychiatric:        Mood and Affect: Mood normal.        Behavior: Behavior normal.     No labs at this visit.     Assessment & Plan:   Problem  List Items Addressed This Visit       Unprioritized   Weight gain    Weight gain was the main thing she was concerned about today.  Fortunately, her abdominal pain has resolved and her CT scan was reassuring overall.  She notes decreased activity and poor eating habits as the cause for the weight gain.  I offered consultation with our nutritionist today, and she declined, saying she knows what she needs to do.       Vaginal atrophy    Upon review of her chart, the gynecologist Dr. Sharlet Salina had recommended vaginal estrogen cream to Sheri [name].  Sheri [name] reports no knowledge of this and I do not believe that she picked it up.  I will have her follow up with Dr. Sharlet Salina for further evaluation after her Pelvic ultrasound.  Fortunately, the vaginal bleeding has ceased and the small thrombosed lesion seen on my exam was no longer there on follow up.       Varicose veins of both lower extremities with pain - Primary    She notes interest in getting a consultation with a vascular/vein specialist.  We discussed cost of possible therapy and whether insurance would pay for it.  I advised her to discuss further with their team.   Plan Refer to [facility] (patient preference)      Relevant Orders   Ambulatory referral to Vascular Surgery    Return in about 3 months (around 04/19/2022).    Gilles Chiquito, MD

## 2022-01-17 NOTE — Assessment & Plan Note (Addendum)
Upon review of her chart, the gynecologist Dr. Sharlet Salina had recommended vaginal estrogen cream to Ms. Frampton.  Sheri [name] reports no knowledge of this and I do not believe that she picked it up.  I will have her follow up with Dr. Sharlet Salina for further evaluation after her Pelvic ultrasound.  Fortunately, the vaginal bleeding has ceased and the small thrombosed lesion seen on my exam was no longer there on follow up.

## 2022-01-23 ENCOUNTER — Ambulatory Visit (HOSPITAL_COMMUNITY): Payer: Medicare Other

## 2022-02-07 ENCOUNTER — Ambulatory Visit (HOSPITAL_COMMUNITY): Payer: Medicare Other

## 2022-02-12 ENCOUNTER — Other Ambulatory Visit: Payer: Self-pay | Admitting: *Deleted

## 2022-02-12 DIAGNOSIS — I83813 Varicose veins of bilateral lower extremities with pain: Secondary | ICD-10-CM

## 2022-02-14 ENCOUNTER — Ambulatory Visit (HOSPITAL_COMMUNITY): Payer: Medicare Other

## 2022-02-21 ENCOUNTER — Other Ambulatory Visit: Payer: Self-pay | Admitting: Internal Medicine

## 2022-02-21 DIAGNOSIS — H8109 Meniere's disease, unspecified ear: Secondary | ICD-10-CM

## 2022-02-26 ENCOUNTER — Encounter (INDEPENDENT_AMBULATORY_CARE_PROVIDER_SITE_OTHER): Payer: Self-pay

## 2022-03-06 DIAGNOSIS — Z961 Presence of intraocular lens: Secondary | ICD-10-CM | POA: Diagnosis not present

## 2022-03-06 DIAGNOSIS — D489 Neoplasm of uncertain behavior, unspecified: Secondary | ICD-10-CM | POA: Diagnosis not present

## 2022-03-06 DIAGNOSIS — Z85828 Personal history of other malignant neoplasm of skin: Secondary | ICD-10-CM | POA: Diagnosis not present

## 2022-03-07 ENCOUNTER — Telehealth: Payer: Self-pay | Admitting: Cardiology

## 2022-03-07 NOTE — Telephone Encounter (Signed)
Advised pt Dr. [name] recommends OV. Scheduled to see EP PA next Wednesday. Patient verbalized understanding and agreeable to plan.

## 2022-03-07 NOTE — Telephone Encounter (Signed)
Pt states that her heartbeat is abnormal to her. Pt would like a callback regarding this matter. Please advise

## 2022-03-07 NOTE — Telephone Encounter (Signed)
Pt reports heart pounding the last 5-7 days. Not constant, but occurred several times. States heart will be beating "normal, then goes boom like real hard, then goes back to normal". States is is pounding, not racing. States she "checked her pulse" but did not count it only felt pulse and it "felt normal to me". She has taken an Propranolol TID couple times this past week, but the extra did not help. Denies any other symptoms such as CP or sob. Pt aware I will forward to MD for advisement.  Informed she is due for OV but that MD may want a monitor first to see what rhythm she is experiencing. Patient verbalized understanding and agreeable to plan.

## 2022-03-09 NOTE — Progress Notes (Unsigned)
Cardiology Office Note Date:  03/09/2022  Patient ID:  Sheri [name], Sheri [name] [birth year], MRN [id number] PCP:  Sid Falcon, MD  Cardiologist:  Dr. Debara Pickett Electrophysiologist: Dr. [name]     Chief Complaint:  Oozing/drainage post ablation at b/l sites  History of Present Illness: Sheri [name] is a 71 y.o. female with history of COPD, OA, Afib, SVT.  With increased SVT burden he underwent an EPS 07/25/20 unable to induce her tachycardia. Had her tachycardia in post op, discharged on Toprol. She saw Dr. [name] 08/23/20 in follow up and wanted to avoid the medication and asked to pursue another EPs and hopefully ablation.  She underwent EPS again 10/26/20 and again without inducible arrhythmias  She called yesterday reporting bleeding/drainage at both groins since her procedure, chaing gause dressing BID.  She was asking to see Dr. [name] though not in the office.  She reported no drainage up to that point yesterday and took an appt to be seen by me today.  I saw her 11/06/20: She noted a day or so after the procedure the gauze was saturated and removed them (both sides) since then has had a slight felt to be persistent drainage and bleeding from both sides. And has kept a piece of gauze or tissue there having to change timee 2x a day Not painful no symtoms of illness or fever No CP, no SVTs No SOB Groin was stable, very superficial serosanguinous slight drainage, Rx keflex  She has seen Dr. [name] and Jonni Sanger since then, most recently, Dr. [name] 08/15/21, she continued to have some palpitations, monitoring noted PACs, very brief SVT (longest 16beats), was unhappy about ongoing symptoms despite minimal findings on her monitor, propanolol was increased.  Pt called 03/07/22 with a few days of different palpitations from her usual, self increased her propanolol to TID without improvement, no other symptoms  *** different? *** risk score?  AFib Hx Diagnosed 2014 PVI ablation  10/12/2015 SVT 07/25/20, non-inducible 10/26/20, non-inducible   Past Medical History:  Diagnosis Date   AF (atrial fibrillation) ([place]) 10/12/2015   Allergic rhinitis    Anxiety    ANXIETY DISORDER, GENERALIZED 08/18/2006   Qualifier: Diagnosis of  By: [name] MD, [name]     Arthritis    no per pt   Atrial fibrillation with RVR ([place])    Cancer ([place])    left lower eyelid, basal cell cancer   Chiari I malformation ([place]) 03/07/2016   pt unsure of this   Chronic sinusitis    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   COPD (chronic obstructive pulmonary disease) ([place])    no per pt   Depression    treated at [facility]   GERD (gastroesophageal reflux disease)    Goiter    hx of goiter   Hx of atrial tachycardia since age 55 03/15/2013   Memory loss 12/31/2016   Mnire's disease 03/07/2016   Palpitations    Associated with difficulty breathing   Panic attacks    Poisoning by selective serotonin reuptake inhibitors(969.03) Jan 2007   severe reaction with MAOI (nardil) and dextromethorphan   Postmenopausal 12/31/2016   SVT (supraventricular tachycardia) ([place]) 03/15/2013   History of SVT s/p afib ablation, 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 Following with cardiology.    Swollen gland 09/14/2018   Tachycardia    Tonsillitis 11/12/2018    Past Surgical History:  Procedure Laterality Date   APPENDECTOMY     COLONOSCOPY     ELECTROPHYSIOLOGIC STUDY N/A  10/12/2015   Procedure: Atrial Fibrillation Ablation;  Surgeon: [name], MD;  Location: [place] [facility];  Service: Cardiovascular;  Laterality: N/A;   LID LESION EXCISION Left 08/06/2017   Procedure: EXCISION BIOPSY OF EYELID, RECONSTRUCTION OF EYELID;  Surgeon: [name], MD;  Location: [facility];  Service: Ophthalmology;  Laterality: Left;   OVARIAN CYST REMOVAL  09/1967   left ovarian cystectomy- 17.5 lb cyst removed- per pt   right mastoidectomy     ET tube placement in 1980 and 1990    right sinus surgery  october 2000   SVT ABLATION N/A 07/25/2020   Procedure: SVT ABLATION;  Surgeon: [name], MD;  Location: [facility];  Service: Cardiovascular;  Laterality: N/A;   SVT ABLATION N/A 10/26/2020   Procedure: SVT ABLATION;  Surgeon: [name], MD;  Location: [facility];  Service: Cardiovascular;  Laterality: N/A;   THYROIDECTOMY, PARTIAL     nodule removal - April 1996    Current Outpatient Medications  Medication Sig Dispense Refill   albuterol (VENTOLIN HFA) 108 (90 Base) MCG/ACT inhaler Inhale 2 puffs into the lungs every 6 (six) hours as needed for wheezing or shortness of breath. 8 g 2   alendronate (FOSAMAX) 70 MG tablet Take 1 tablet (70 mg total) by mouth every 7 (seven) days. Take with a full glass of water on an empty stomach. 4 tablet 11   estradiol (ESTRACE VAGINAL) 0.1 MG/GM vaginal cream Place 1 g vaginally 2 (two) times a week. 42.5 g 2   hydrocortisone 2.5 % cream Apply topically daily as needed (Pain from hemorrhoids). 28 g 2   meclizine (ANTIVERT) 25 MG tablet TAKE 1 TABLET BY MOUTH THREE TIMES DAILY AS NEEDED FOR DIZZINESS 20 tablet 0   propranolol (INDERAL) 20 MG tablet Take 1 tablet (20 mg total) by mouth 2 (two) times daily. 180 tablet 3   QUEtiapine (SEROQUEL) 200 MG tablet Take 100 mg by mouth at bedtime.     traZODone (DESYREL) 100 MG tablet Take 100 mg by mouth at bedtime.     venlafaxine XR (EFFEXOR-XR) 75 MG 24 hr capsule Take 150 mg by mouth daily.     No current facility-administered medications for this visit.    Allergies:   Guaifenesin   Social History:  The patient  reports that she has never smoked. She has never used smokeless tobacco. She reports that she does not drink alcohol and does not use drugs.   Family History:  The patient's family history includes Atrial fibrillation in her sister and sister; Breast cancer (age of onset: 72) in her niece; Heart attack in her father; Hypertension in her father  and mother; Psoriasis in her father.  ROS:  Please see the history of present illness.    All other systems are reviewed and otherwise negative.   PHYSICAL EXAM:  VS:  There were no vitals taken for this visit. BMI: There is no height or weight on file to calculate BMI. Well nourished, well developed, in no acute distress HEENT: normocephalic, atraumatic Neck: no JVD, carotid bruits or masses Cardiac:  *** *** RRR; no significant murmurs, no rubs, or gallops Lungs:  CTA b/l, no wheezing, rhonchi or rales Abd: soft, nontender MS: no deformity or atrophy Ext:  *** no edema Skin: warm and dry, no rash Neuro:  No gross deficits appreciated Psych: euthymic mood, full affect    EKG:  Not done today   Cardiac monitor 07/27/2021  Predominant underlying rhythm was sinus rhythm 8 SVT runs occurred, longest lasting 16 beats Less than 1% ventricular and supraventricular ectopy Triggered episodes associated with sinus rhythm   10/26/20 EPS CONCLUSIONS:  1. Sinus rhythm upon presentation.  2.  Negative EP study 3. No early apparent complications.   07/25/2020: EPS CONCLUSIONS:  1. Sinus rhythm upon presentation.  2. No inducible arrhythmias 3. No early apparent complications   0/76/2263: EPS/ablation CONCLUSIONS: 1. Sinus rhythm upon presentation.   2. Successful electrical isolation and anatomical encircling of all four pulmonary veins with radiofrequency current. 3. No inducible arrhythmias following ablation both on and off of Isuprel 4. No early apparent complications.  03/15/2013: TTE Study Conclusions  - Left ventricle: The cavity size was normal. Wall thickness    was increased in a pattern of mild LVH. Systolic function    was normal. The estimated ejection fraction was in the    range of 55% to 60%. Wall motion was normal; there were no    regional wall motion abnormalities. Left ventricular    diastolic function parameters were normal.  - Left atrium: The atrium was  normal in size.  - Tricuspid valve: Poorly visualized. Trivial regurgitation.  - Pulmonary arteries: PA peak pressure: 2m Hg (S).  - Inferior vena cava: The vessel was normal in size; the    respirophasic diameter changes were in the normal range (=    50%); findings are consistent with normal central venous    pressure.      Recent Labs: 07/09/2021: BUN 14; Creatinine, Ser 0.83; Hemoglobin 13.8; Platelets 370; Potassium 4.2; Sodium 141; TSH 4.010  No results found for requested labs within last 365 days.   CrCl cannot be calculated (Patient's most recent lab result is older than the maximum 21 days allowed.).   Wt Readings from Last 3 Encounters:  01/17/22 165 lb 11.2 oz (75.2 kg)  01/07/22 165 lb 3.2 oz (74.9 kg)  11/12/21 159 lb 12.8 oz (72.5 kg)     Other studies reviewed: Additional studies/records reviewed today include: summarized above  ASSESSMENT AND PLAN:  1. Paroxysmal Afib     CHA2DS2Vasc is 2 (age, gender), off a/c     ***            2. SVT     S/p 2 negative EP studies     ***  Disposition: ***   Current medicines are reviewed at length with the patient today.  The patient did not have any concerns regarding medicines.  SVenetia Night PA-C 03/09/2022 7:36 AM     [place] [phone](office)  [phone](fax)

## 2022-03-10 ENCOUNTER — Ambulatory Visit (HOSPITAL_COMMUNITY): Payer: Medicare Other

## 2022-03-12 ENCOUNTER — Encounter: Payer: Self-pay | Admitting: Physician Assistant

## 2022-03-12 ENCOUNTER — Ambulatory Visit: Payer: Medicare Other

## 2022-03-12 ENCOUNTER — Ambulatory Visit (INDEPENDENT_AMBULATORY_CARE_PROVIDER_SITE_OTHER): Payer: Medicare Other | Admitting: Physician Assistant

## 2022-03-12 VITALS — BP 128/72 | HR 70 | Ht 65.5 in | Wt 164.0 lb

## 2022-03-12 DIAGNOSIS — I480 Paroxysmal atrial fibrillation: Secondary | ICD-10-CM

## 2022-03-12 DIAGNOSIS — I493 Ventricular premature depolarization: Secondary | ICD-10-CM

## 2022-03-12 DIAGNOSIS — R002 Palpitations: Secondary | ICD-10-CM | POA: Diagnosis not present

## 2022-03-12 DIAGNOSIS — R0602 Shortness of breath: Secondary | ICD-10-CM

## 2022-03-12 DIAGNOSIS — I471 Supraventricular tachycardia: Secondary | ICD-10-CM | POA: Diagnosis not present

## 2022-03-12 LAB — BASIC METABOLIC PANEL
BUN/Creatinine Ratio: 14 (ref 12–28)
BUN: 11 mg/dL (ref 8–27)
CO2: 23 mmol/L (ref 20–29)
Calcium: 9.2 mg/dL (ref 8.7–10.3)
Chloride: 107 mmol/L — ABNORMAL HIGH (ref 96–106)
Creatinine, Ser: 0.81 mg/dL (ref 0.57–1.00)
Glucose: 89 mg/dL (ref 70–99)
Potassium: 4.4 mmol/L (ref 3.5–5.2)
Sodium: 142 mmol/L (ref 134–144)
eGFR: 78 mL/min/{1.73_m2} (ref >59)

## 2022-03-12 LAB — MAGNESIUM: Magnesium: 2.2 mg/dL (ref 1.6–2.3)

## 2022-03-12 NOTE — Progress Notes (Unsigned)
Enrolled for [employer] to mail a ZIO XT long term holter monitor to the patients address on file.   Dr. Camnitz to read. 

## 2022-03-12 NOTE — Patient Instructions (Addendum)
Medication Instructions:   Your physician recommends that you continue on your current medications as directed. Please refer to the Current Medication list given to you today.  *If you need a refill on your cardiac medications before your next appointment, please call your pharmacy*   Lab Work:  [facility] AND [place]   If you have labs (blood work) drawn today and your tests are completely normal, you will receive your results only by: [place] (if you have MyChart) OR A paper copy in the mail If you have any lab test that is abnormal or we need to change your treatment, we will call you to review the results.   Testing/Procedures: Your physician has requested that you have an echocardiogram. Echocardiography is a painless test that uses sound waves to create images of your heart. It provides your doctor with information about the size and shape of your heart and how well your heart's chambers and valves are working. This procedure takes approximately one hour. There are no restrictions for this procedure.  Your physician has recommended that you wear an event monitor. Event monitors are medical devices that record the heart's electrical activity. Doctors most often [place] these monitors to diagnose arrhythmias. Arrhythmias are problems with the speed or rhythm of the heartbeat. The monitor is a small, portable device. You can wear one while you do your normal daily activities. This is usually used to diagnose what is causing palpitations/syncope (passing out).   Follow-Up: At [facility], you and your health needs are our priority.  As part of our continuing mission to provide you with exceptional heart care, we have created designated Provider Care Teams.  These Care Teams include your primary Cardiologist (physician) and Advanced Practice Providers (APPs -  Physician Assistants and Nurse Practitioners) who all work together to provide you with the care you need, when you need it.  We  recommend signing up for the patient portal called "MyChart".  Sign up information is provided on this After Visit Summary.  MyChart is used to connect with patients for Virtual Visits (Telemedicine).  Patients are able to view lab/test results, encounter notes, upcoming appointments, etc.  Non-urgent messages can be sent to your provider as well.   To learn more about what you can do with MyChart, go to [email or web address].    Your next appointment:   3 month(s)  The format for your next appointment:   In Person  Provider:   You may see [name], MD or one of the following Advanced Practice Providers on your designated Care Team:   Tommye Standard, [place] Legrand Como "Jonni Sanger" Tillery, PA-C   [place] Monitor Instructions  Your physician has requested you wear a ZIO patch monitor for 14 days.  This is a single patch monitor. [employer] supplies one patch monitor per enrollment. Additional stickers are not available. Please do not apply patch if you will be having a Nuclear Stress Test,  Echocardiogram, Cardiac CT, MRI, or Chest Xray during the period you would be wearing the  monitor. The patch cannot be worn during these tests. You cannot remove and re-apply the  ZIO XT patch monitor.  Your ZIO patch monitor will be mailed 3 day USPS to your address on file. It may take 3-5 days  to receive your monitor after you have been enrolled.  Once you have received your monitor, please review the enclosed instructions. Your monitor  has already been registered assigning a specific monitor serial # to you.  Billing and Patient Assistance Program Information  We have supplied [employer] with any of your insurance information on file for billing purposes. [employer] offers a sliding scale Patient Assistance Program for patients that do not have  insurance, or whose insurance does not completely cover the cost of the ZIO monitor.  You must apply for the Patient Assistance Program to qualify  for this discounted rate.  To apply, please call [employer] at [phone], select option 4, select option 2, ask to apply for  Patient Assistance Program. Theodore Demark will ask your household income, and how many people  are in your household. They will quote your out-of-pocket cost based on that information.  [employer] will also be able to set up a 62-month interest-free payment plan if needed.  Applying the monitor   Shave hair from upper left chest.  Hold abrader disc by orange tab. Rub abrader in 40 strokes over the upper left chest as  indicated in your monitor instructions.  Clean area with 4 enclosed alcohol pads. Let dry.  Apply patch as indicated in monitor instructions. Patch will be placed under collarbone on left  side of chest with arrow pointing upward.  Rub patch adhesive wings for 2 minutes. Remove white label marked "1". Remove the white  label marked "2". Rub patch adhesive wings for 2 additional minutes.  While looking in a mirror, press and release button in center of patch. A small green light will  flash 3-4 times. This will be your only indicator that the monitor has been turned on.  Do not shower for the first 24 hours. You may shower after the first 24 hours.  Press the button if you feel a symptom. You will hear a small click. Record Date, Time and  Symptom in the Patient Logbook.  When you are ready to remove the patch, follow instructions on the last 2 pages of Patient  Logbook. Stick patch monitor onto the last page of Patient Logbook.  Place Patient Logbook in the blue and white box. Use locking tab on box and tape box closed  securely. The blue and white box has prepaid postage on it. Please place it in the mailbox as  soon as possible. Your physician should have your test results approximately 7 days after the  monitor has been [facility]  Call [place]at [phone] you have questions regarding  your ZIO XT patch  monitor. Call them immediately if you see an orange light blinking on your  monitor.  If your monitor falls off in less than 4 days, contact our Monitor department at [phone]  If your monitor becomes loose or falls off after 4 days call [employer] at [phone]  suggestions on securing your monitor

## 2022-03-21 ENCOUNTER — Other Ambulatory Visit (HOSPITAL_COMMUNITY): Payer: Medicare Other

## 2022-03-30 NOTE — Progress Notes (Unsigned)
Requested by:  Sid Falcon, MD [address] [place],  [place] [id number]  Reason for consultation: painful and swollen varicose and spider veins    History of Present Illness   Sheri [name] is a 71 y.o. (Oct 02, 1950) female who presents for evaluation of ***  Venous symptoms include: (aching, heavy, tired, throbbing, burning, itching, swelling, bleeding, ulcer)  *** Onset/duration:  ***  Occupation:  *** Aggravating factors: (sitting, standing) Alleviating factors: (elevation) Compression:  *** Helps:  *** Pain medications:  *** Previous vein procedures:  *** History of DVT:  ***  Past Medical History:  Diagnosis Date   AF (atrial fibrillation) ([place]) 10/12/2015   Allergic rhinitis    Anxiety    ANXIETY DISORDER, GENERALIZED 08/18/2006   Qualifier: Diagnosis of  By: [name] MD, [name]     Arthritis    no per pt   Atrial fibrillation with RVR ([place])    Cancer ([place])    left lower eyelid, basal cell cancer   Chiari I malformation ([place]) 03/07/2016   pt unsure of this   Chronic sinusitis    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   COPD (chronic obstructive pulmonary disease) ([place])    no per pt   Depression    treated at [facility]   GERD (gastroesophageal reflux disease)    Goiter    hx of goiter   Hx of atrial tachycardia since age 4 03/15/2013   Memory loss 12/31/2016   Mnire's disease 03/07/2016   Palpitations    Associated with difficulty breathing   Panic attacks    Poisoning by selective serotonin reuptake inhibitors(969.03) Jan 2007   severe reaction with MAOI (nardil) and dextromethorphan   Postmenopausal 12/31/2016   SVT (supraventricular tachycardia) ([place]) 03/15/2013   History of SVT s/p afib ablation, 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 Following with cardiology.    Swollen gland 09/14/2018   Tachycardia    Tonsillitis 11/12/2018    Past Surgical History:  Procedure Laterality Date   APPENDECTOMY     COLONOSCOPY      ELECTROPHYSIOLOGIC STUDY N/A 10/12/2015   Procedure: Atrial Fibrillation Ablation;  Surgeon: [name], MD;  Location: [facility];  Service: Cardiovascular;  Laterality: N/A;   LID LESION EXCISION Left 08/06/2017   Procedure: EXCISION BIOPSY OF EYELID, RECONSTRUCTION OF EYELID;  Surgeon: [name], MD;  Location: [facility];  Service: Ophthalmology;  Laterality: Left;   OVARIAN CYST REMOVAL  09/1967   left ovarian cystectomy- 17.5 lb cyst removed- per pt   right mastoidectomy     ET tube placement in 1980 and 1990   right sinus surgery  october 2000   SVT ABLATION N/A 07/25/2020   Procedure: SVT ABLATION;  Surgeon: [name], MD;  Location: [facility];  Service: Cardiovascular;  Laterality: N/A;   SVT ABLATION N/A 10/26/2020   Procedure: SVT ABLATION;  Surgeon: [name], MD;  Location: [facility];  Service: Cardiovascular;  Laterality: N/A;   THYROIDECTOMY, PARTIAL     nodule removal - April 1996    Social History   Socioeconomic History   Marital status: Widowed    Spouse name: Not on file   Number of children: Not on file   Years of education: Not on file   Highest education level: Not on file  Occupational History   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   Social Determinants of Health   Financial Resource Strain: Not on file  Food Insecurity: Not on file  Transportation Needs: Not on file  Physical Activity: Not on file  Stress: Not on file  Social Connections: Not on file  Intimate Partner Violence: Not on file   *** Family History  Problem Relation Age of Onset    Hypertension Mother    Hypertension Father    Psoriasis Father    Heart attack Father    Atrial fibrillation Sister        had a successful a fib ablation   Atrial fibrillation Sister        had a successful a fib ablation   Breast cancer Niece 44   Colon cancer Neg Hx    Esophageal cancer Neg Hx    Stomach cancer Neg Hx    Rectal cancer Neg Hx    Colon polyps Neg Hx     Current Outpatient Medications  Medication Sig Dispense Refill   albuterol (VENTOLIN HFA) 108 (90 Base) MCG/ACT inhaler Inhale 2 puffs into the lungs every 6 (six) hours as needed for wheezing or shortness of breath. 8 g 2   estradiol (ESTRACE VAGINAL) 0.1 MG/GM vaginal cream Place 1 g vaginally 2 (two) times a week. 42.5 g 2   hydrocortisone 2.5 % cream Apply topically daily as needed (Pain from hemorrhoids). 28 g 2   meclizine (ANTIVERT) 25 MG tablet TAKE 1 TABLET BY MOUTH THREE TIMES DAILY AS NEEDED FOR DIZZINESS 20 tablet 0   propranolol (INDERAL) 20 MG tablet Take 1 tablet (20 mg total) by mouth 2 (two) times daily. 180 tablet 3   QUEtiapine (SEROQUEL) 200 MG tablet Take 100 mg by mouth at bedtime.     traZODone (DESYREL) 100 MG tablet Take 100 mg by mouth at bedtime.     venlafaxine XR (EFFEXOR-XR) 75 MG 24 hr capsule Take 150 mg by mouth daily.     No current facility-administered medications for this visit.    Allergies  Allergen Reactions   Guaifenesin Palpitations    ***REVIEW OF SYSTEMS (negative unless checked):   Cardiac:  '[]'$  Chest pain or chest pressure? '[]'$  Shortness of breath upon activity? '[]'$  Shortness of breath when lying flat? '[]'$  Irregular heart rhythm?  Vascular:  '[]'$  Pain in calf, thigh, or hip brought on by walking? '[]'$  Pain in feet at night that wakes you up from your sleep? '[]'$  Blood clot in your veins? '[]'$  Leg swelling?  Pulmonary:  '[]'$  Oxygen at home? '[]'$  Productive cough? '[]'$  Wheezing?  Neurologic:  '[]'$  Sudden weakness in arms or legs? '[]'$  Sudden numbness in arms or legs? '[]'$   Sudden onset of difficult speaking or slurred speech? '[]'$  Temporary loss of vision in one eye? '[]'$  Problems with dizziness?  Gastrointestinal:  '[]'$  Blood in stool? '[]'$  Vomited blood?  Genitourinary:  '[]'$  Burning when urinating? '[]'$  Blood in urine?  Psychiatric:  '[]'$  Major depression  Hematologic:  '[]'$  Bleeding problems? '[]'$  Problems with blood clotting?  Dermatologic:  '[]'$  Rashes or ulcers?  Constitutional:  '[]'$  Fever or chills?  Ear/Nose/Throat:  '[]'$  Change in hearing? '[]'$   Nose bleeds? '[]'$  Sore throat?  Musculoskeletal:  '[]'$  Back pain? '[]'$  Joint pain? '[]'$  Muscle pain?   Physical Examination    There were no vitals filed for this visit. There is no height or weight on file to calculate BMI.  General:  WDWN in NAD; vital signs documented above Gait: Not observed HENT: WNL, normocephalic Pulmonary: normal non-labored breathing , without Rales, rhonchi,  wheezing Cardiac: {Desc; regular/irreg:14544} HR, without  Murmurs {With/Without:20273} carotid bruit*** Abdomen: soft, NT, no masses Skin: {With/Without:20273} rashes Vascular Exam/Pulses:  Right Left  Radial {Exam; arterial pulse strength 0-4:30167} {Exam; arterial pulse strength 0-4:30167}  Ulnar {Exam; arterial pulse strength 0-4:30167} {Exam; arterial pulse strength 0-4:30167}  Femoral {Exam; arterial pulse strength 0-4:30167} {Exam; arterial pulse strength 0-4:30167}  Popliteal {Exam; arterial pulse strength 0-4:30167} {Exam; arterial pulse strength 0-4:30167}  DP {Exam; arterial pulse strength 0-4:30167} {Exam; arterial pulse strength 0-4:30167}  PT {Exam; arterial pulse strength 0-4:30167} {Exam; arterial pulse strength 0-4:30167}   Extremities: {With/Without:20273} varicose veins, {With/Without:20273} reticular veins, {With/Without:20273} edema, {With/Without:20273} stasis pigmentation, {With/Without:20273} lipodermatosclerosis, {With/Without:20273} ulcers Musculoskeletal: no muscle wasting or atrophy  Neurologic: A&O  X 3;  No focal weakness or paresthesias are detected Psychiatric:  The pt has {Desc; normal/abnormal:11317::"Normal"} affect.  Non-invasive Vascular Imaging   BLE Venous Insufficiency Duplex (***):  RLE:  *** DVT and SVT,  *** GSV reflux ***, GSV diameter *** *** SSV reflux ***, *** deep venous reflux  LLE: *** DVT and SVT,  *** GSV reflux ***,  GSV diameter *** *** SSV reflux ***, *** deep venous reflux   Medical Decision Making   MARILU RYLANDER is a 71 y.o. female who presents with: ***LE chronic venous insufficiency, ***varicose veins with complications  Based on the patient's history and examination, I recommend: ***. I discussed with the patient the use of her 20-30 mm thigh high compression stockings and need for 3 month trial of such. The patient will follow up in 3 months with Dr. Marland Kitchen Thank you for allowing [place] to participate in this patient's care.   Karoline Caldwell, PA-C [facility] Office: [phone]  03/30/2022, 9:51 AM  Clinic MD: Roxanne Mins

## 2022-03-31 ENCOUNTER — Other Ambulatory Visit: Payer: Self-pay | Admitting: Internal Medicine

## 2022-03-31 DIAGNOSIS — M810 Age-related osteoporosis without current pathological fracture: Secondary | ICD-10-CM

## 2022-04-01 ENCOUNTER — Encounter: Payer: Self-pay | Admitting: Physician Assistant

## 2022-04-01 ENCOUNTER — Ambulatory Visit (HOSPITAL_BASED_OUTPATIENT_CLINIC_OR_DEPARTMENT_OTHER): Payer: Medicare Other

## 2022-04-01 ENCOUNTER — Ambulatory Visit (INDEPENDENT_AMBULATORY_CARE_PROVIDER_SITE_OTHER): Payer: Medicare Other | Admitting: Physician Assistant

## 2022-04-01 ENCOUNTER — Ambulatory Visit (HOSPITAL_COMMUNITY)
Admission: RE | Admit: 2022-04-01 | Discharge: 2022-04-01 | Disposition: A | Discharge status: Home / Self Care | Payer: Medicare Other | Source: Ambulatory Visit | Attending: Physician Assistant | Admitting: Physician Assistant

## 2022-04-01 VITALS — BP 110/66 | HR 68 | Temp 98.6°F | Resp 20 | Ht 65.5 in | Wt 161.5 lb

## 2022-04-01 DIAGNOSIS — I493 Ventricular premature depolarization: Secondary | ICD-10-CM

## 2022-04-01 DIAGNOSIS — R000 Tachycardia, unspecified: Secondary | ICD-10-CM | POA: Diagnosis not present

## 2022-04-01 DIAGNOSIS — R002 Palpitations: Secondary | ICD-10-CM

## 2022-04-01 DIAGNOSIS — I83813 Varicose veins of bilateral lower extremities with pain: Secondary | ICD-10-CM | POA: Insufficient documentation

## 2022-04-01 DIAGNOSIS — I872 Venous insufficiency (chronic) (peripheral): Secondary | ICD-10-CM | POA: Diagnosis not present

## 2022-04-01 DIAGNOSIS — R0602 Shortness of breath: Secondary | ICD-10-CM | POA: Insufficient documentation

## 2022-04-01 DIAGNOSIS — I351 Nonrheumatic aortic (valve) insufficiency: Secondary | ICD-10-CM | POA: Insufficient documentation

## 2022-04-01 DIAGNOSIS — I781 Nevus, non-neoplastic: Secondary | ICD-10-CM | POA: Diagnosis not present

## 2022-04-01 DIAGNOSIS — I4891 Unspecified atrial fibrillation: Secondary | ICD-10-CM | POA: Insufficient documentation

## 2022-04-01 DIAGNOSIS — J449 Chronic obstructive pulmonary disease, unspecified: Secondary | ICD-10-CM | POA: Insufficient documentation

## 2022-04-01 LAB — ECHOCARDIOGRAM COMPLETE
AR max vel: 2.84 cm2
AV Area VTI: 2.85 cm2
AV Area mean vel: 2.68 cm2
AV Mean grad: 4 mmHg
AV Peak grad: 6.7 mmHg
Ao pk vel: 1.29 m/s
Area-P 1/2: 4.15 cm2
Height: 65.5 in
P 1/2 time: 489 msec
S' Lateral: 2.5 cm
Weight: 2584 oz

## 2022-04-02 DIAGNOSIS — H029 Unspecified disorder of eyelid: Secondary | ICD-10-CM | POA: Insufficient documentation

## 2022-04-11 ENCOUNTER — Encounter: Payer: Self-pay | Admitting: Internal Medicine

## 2022-04-11 ENCOUNTER — Ambulatory Visit (INDEPENDENT_AMBULATORY_CARE_PROVIDER_SITE_OTHER): Payer: Medicare Other | Admitting: Internal Medicine

## 2022-04-11 ENCOUNTER — Other Ambulatory Visit: Payer: Self-pay

## 2022-04-11 DIAGNOSIS — I471 Supraventricular tachycardia: Secondary | ICD-10-CM | POA: Diagnosis not present

## 2022-04-11 DIAGNOSIS — I83813 Varicose veins of bilateral lower extremities with pain: Secondary | ICD-10-CM | POA: Diagnosis not present

## 2022-04-11 DIAGNOSIS — M810 Age-related osteoporosis without current pathological fracture: Secondary | ICD-10-CM

## 2022-04-11 DIAGNOSIS — H8109 Meniere's disease, unspecified ear: Secondary | ICD-10-CM

## 2022-04-11 DIAGNOSIS — H029 Unspecified disorder of eyelid: Secondary | ICD-10-CM | POA: Diagnosis not present

## 2022-04-11 DIAGNOSIS — F3341 Major depressive disorder, recurrent, in partial remission: Secondary | ICD-10-CM

## 2022-04-11 MED ORDER — MECLIZINE HCL 25 MG PO TABS: 25.0000 mg | ORAL_TABLET | Freq: Two times a day (BID) | ORAL | 0 refills | Status: DC | PRN

## 2022-04-11 NOTE — Patient Instructions (Addendum)
Sheri [name] - -  I am glad you are doing so well.  Please continue your medications.   I will reach out to your cardiologist to see if they have read your monitor yet.    I will update your prescription for meclizine.   Please come back to see me in 3 months.

## 2022-04-11 NOTE — Progress Notes (Unsigned)
Established Patient Office Visit  Subjective   Patient ID: Sheri [name], female    DOB: December 28, 1950  Age: 71 y.o. MRN: [id number]  Chief Complaint  Patient presents with   Follow-up    Sheri [name] is a 71 year old woman with PMH of SVT/PVCs, Meniere's disease, osteoporosis, MDD who presents today for 2 month follow up.   She has no complaints today.  Would like her meclizine to be changed to 60 tablets a month. She is due for the flu vaccine and will get today.     Patient Active Problem List   Diagnosis Date Noted   Eyelid lesion 04/02/2022   Vaginal atrophy 01/17/2022   Varicose veins of both lower extremities with pain 01/17/2022   Atherosclerosis of aorta ([place]) 01/17/2022   Skin lesion of back 06/28/2021   Internal and external hemorrhoids without complication 62/83/1517   Recurrent major depressive disorder, in partial remission ([place]) 08/10/2020   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   Osteoporosis 01/26/2018   Postmenopausal 12/31/2016   Poor dentition 12/31/2016   Mnire's disease 03/07/2016   Chiari I malformation ([place]) 03/07/2016   Weight gain 09/21/2015   Healthcare maintenance 04/14/2013   SVT (supraventricular tachycardia) ([place]) 03/15/2013      Review of Systems  Constitutional: Negative.   Cardiovascular: Negative.   Genitourinary: Negative.   Musculoskeletal: Negative.   Neurological:  Positive for dizziness (occasional).  Psychiatric/Behavioral: Negative.        Objective:     BP 106/63 (BP Location: Left Arm, Patient Position: Sitting, Cuff Size: Normal)   Pulse 79   Temp 98.6 F (37 C) (Oral)   Ht 5' 5.5" (1.664 m)   Wt 161 lb 8 oz (73.3 kg)   SpO2 98% Comment: RA  BMI 26.47 kg/m  BP Readings from Last 3 Encounters:  04/11/22 106/63  04/01/22 110/66  03/12/22 128/72   Wt Readings from Last 3 Encounters:  04/11/22 161 lb 8 oz (73.3 kg)  04/01/22 161 lb 8 oz (73.3 kg)  03/12/22 164 lb (74.4 kg)      Physical Exam Vitals and  nursing note reviewed.  Constitutional:      General: She is not in acute distress.    Appearance: Normal appearance. She is not toxic-appearing.  Cardiovascular:     Rate and Rhythm: Normal rate and regular rhythm.  Pulmonary:     Effort: Pulmonary effort is normal. No respiratory distress.     Breath sounds: Normal breath sounds.  Skin:    General: Skin is warm and dry.  Neurological:     Mental Status: She is alert and oriented to person, place, and time. Mental status is at baseline.  Psychiatric:        Mood and Affect: Mood normal.        Behavior: Behavior normal.    No labs today     Assessment & Plan:   Problem List Items Addressed This Visit       Unprioritized   Mnire's disease (Chronic)    Stable, controlled with meclizine.  She was previously getting 60 tab a month, and this was changed for unclear reason.  Will send in a new Rx for 60 tabs.       Relevant Medications   meclizine (ANTIVERT) 25 MG tablet   SVT (supraventricular tachycardia) ([place])    Seen by Cardiology.  She reports not hearing back from them about her holter monitor, and I don't see a telephone call in her chart.  She is due to see Cardiology this week, so likely she will have discussion with them at that time.   Plan Continue propranolol      Osteoporosis    She is taking alendronate without issue.  Repeat DEXA in 2-5 years (last in 2022)      Recurrent major depressive disorder, in partial remission ([place])    She notes stable symptoms.  PHQ-9 relatively high.  Follows with psychiatry.   Continue triple therapy, filled by psychiatry.       Varicose veins of both lower extremities with pain    Seen by vascular.  She will have cosmetic sclerotherapy for her veins.       Eyelid lesion    Due for biopsy/excision which is scheduled for next month.        Return in about 3 months (around 07/11/2022).    Gilles Chiquito, MD

## 2022-04-12 NOTE — Assessment & Plan Note (Signed)
Seen by Cardiology.  She reports not hearing back from them about her holter monitor, and I don't see a telephone call in her chart.  She is due to see Cardiology this week, so likely she will have discussion with them at that time.   Plan Continue propranolol

## 2022-04-12 NOTE — Assessment & Plan Note (Signed)
Due for biopsy/excision which is scheduled for next month.

## 2022-04-12 NOTE — Assessment & Plan Note (Signed)
She notes stable symptoms.  PHQ-9 relatively high.  Follows with psychiatry.   Continue triple therapy, filled by psychiatry.

## 2022-04-12 NOTE — Assessment & Plan Note (Signed)
Stable, controlled with meclizine.  She was previously getting 60 tab a month, and this was changed for unclear reason.  Will send in a new Rx for 60 tabs.

## 2022-04-12 NOTE — Assessment & Plan Note (Signed)
She is taking alendronate without issue.  Repeat DEXA in 2-5 years (last in 2022)

## 2022-04-12 NOTE — Assessment & Plan Note (Signed)
Seen by vascular.  She will have cosmetic sclerotherapy for her veins.

## 2022-04-15 ENCOUNTER — Ambulatory Visit: Payer: Medicare Other | Admitting: Obstetrics & Gynecology

## 2022-04-29 ENCOUNTER — Ambulatory Visit: Payer: Medicare Other

## 2022-04-30 ENCOUNTER — Ambulatory Visit (INDEPENDENT_AMBULATORY_CARE_PROVIDER_SITE_OTHER): Payer: Medicare Other | Admitting: Family Medicine

## 2022-04-30 ENCOUNTER — Encounter: Payer: Self-pay | Admitting: Family Medicine

## 2022-04-30 VITALS — BP 118/67 | HR 78 | Temp 98.1°F | Ht 65.5 in | Wt 161.6 lb

## 2022-04-30 DIAGNOSIS — H8110 Benign paroxysmal vertigo, unspecified ear: Secondary | ICD-10-CM

## 2022-04-30 DIAGNOSIS — R42 Dizziness and giddiness: Secondary | ICD-10-CM | POA: Diagnosis not present

## 2022-04-30 LAB — GLUCOSE, CAPILLARY: Glucose-Capillary: 88 mg/dL (ref 70–99)

## 2022-04-30 NOTE — Patient Instructions (Addendum)
Continue meclizine Perform Epley maneuver at home Referral for ENT and PT was placed. Follow-up as needed

## 2022-04-30 NOTE — Progress Notes (Signed)
CC: Dizziness   HPI: [name] is a 71 y.o. female with past medical history listed below presenting to clinic with complaints of dizziness for 12 days.  Patient reports she feels dizzy when she tries to change position in the bed.  She feels dizzy while turning either side in the bed.  Sometimes dizziness associate with nausea.  Denies symptoms during standing up from sitting position, ambulation or driving.  Denies fever, headache, neck pain, chest pain, palpitations or shortness of breath.  Patient is taking meclizine with no relief in symptoms.  For details of today's visit and the status of his chronic medical issues please refer to the assessment and plan.   Past Medical History:  Diagnosis Date   AF (atrial fibrillation) ([place]) 10/12/2015   Allergic rhinitis    Anxiety    ANXIETY DISORDER, GENERALIZED 08/18/2006   Qualifier: Diagnosis of  By: [name] MD, [name]     Arthritis    no per pt   Atrial fibrillation with RVR ([place])    Cancer ([place])    left lower eyelid, basal cell cancer   Chiari I malformation ([place]) 03/07/2016   pt unsure of this   Chronic sinusitis    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   COPD (chronic obstructive pulmonary disease) ([place])    no per pt   Depression    treated at [facility]   GERD (gastroesophageal reflux disease)    Goiter    hx of goiter   Hx of atrial tachycardia since age 30 03/15/2013   Memory loss 12/31/2016   Mnire's disease 03/07/2016   Palpitations    Associated with difficulty breathing   Panic attacks    Poisoning by selective serotonin reuptake inhibitors(969.03) Jan 2007   severe reaction with MAOI (nardil) and dextromethorphan   Postmenopausal 12/31/2016   SVT (supraventricular tachycardia) 03/15/2013   History of SVT s/p afib ablation, 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 Following with cardiology.    Swollen gland 09/14/2018   Tachycardia    Tonsillitis 11/12/2018   Review of Systems:  Review  of Systems  Constitutional: Negative.  Negative for fever.  Respiratory:  Negative for shortness of breath.   Cardiovascular:  Negative for chest pain.  Neurological:  Positive for dizziness. Negative for weakness and headaches.     Physical Exam: Physical Exam Vitals and nursing note reviewed.  Constitutional:      Appearance: Normal appearance.  HENT:     Head: Normocephalic and atraumatic.  Eyes:     Extraocular Movements: Extraocular movements intact.     Conjunctiva/sclera: Conjunctivae normal.     Pupils: Pupils are equal, round, and reactive to light.  Cardiovascular:     Rate and Rhythm: Normal rate and regular rhythm.  Pulmonary:     Effort: Pulmonary effort is normal.     Breath sounds: Normal breath sounds.  Musculoskeletal:     Cervical back: Normal range of motion and neck supple.  Skin:    General: Skin is warm.  Neurological:     General: No focal deficit present.     Mental Status: She is alert and oriented to person, place, and time.     Cranial Nerves: Cranial nerves 2-12 are intact. No cranial nerve deficit, dysarthria or facial asymmetry.     Sensory: Sensation is intact.     Motor: No weakness.     Coordination: Coordination is intact.     Gait: Gait is intact.  Vitals:   04/30/22 1605  BP: 118/67  Pulse: 78  Temp: 98.1 F (36.7 C)  TempSrc: Oral  SpO2: 95%  Weight: 161 lb 9.6 oz (73.3 kg)  Height: 5' 5.5" (1.664 m)    Assessment & Plan:   See Encounters Tab for problem based charting.  Patient seen with Dr. Daryll Drown

## 2022-05-05 NOTE — Progress Notes (Signed)
Internal Medicine Clinic Attending  Case discussed with Dr. Jeanett Schlein  at the time of the visit.  We reviewed the resident's history and exam and pertinent patient test results.  I agree with the assessment, diagnosis, and plan of care documented in the resident's note.

## 2022-05-06 ENCOUNTER — Ambulatory Visit: Payer: Medicare Other | Admitting: Physical Therapy

## 2022-05-13 ENCOUNTER — Ambulatory Visit: Payer: Medicare Other | Admitting: Physical Therapy

## 2022-05-22 ENCOUNTER — Other Ambulatory Visit: Payer: Self-pay | Admitting: Internal Medicine

## 2022-05-22 DIAGNOSIS — H8109 Meniere's disease, unspecified ear: Secondary | ICD-10-CM

## 2022-05-22 NOTE — Telephone Encounter (Signed)
Next appt scheduled 07/25/22 with PCP.

## 2022-06-03 ENCOUNTER — Ambulatory Visit: Payer: Medicare Other | Admitting: Physical Therapy

## 2022-06-18 ENCOUNTER — Ambulatory Visit: Payer: Medicare Other | Admitting: Cardiology

## 2022-06-23 ENCOUNTER — Encounter: Payer: Self-pay | Admitting: Internal Medicine

## 2022-06-26 ENCOUNTER — Ambulatory Visit (INDEPENDENT_AMBULATORY_CARE_PROVIDER_SITE_OTHER): Payer: Medicare Other | Admitting: Student

## 2022-06-26 DIAGNOSIS — Z0000 Encounter for general adult medical examination without abnormal findings: Secondary | ICD-10-CM

## 2022-06-26 NOTE — Progress Notes (Signed)
Attempted to call pt x2 and left a message with no response. No charge.

## 2022-06-27 ENCOUNTER — Telehealth: Payer: Medicare Other | Admitting: Internal Medicine

## 2022-06-27 NOTE — Progress Notes (Signed)
Patient called in stating no one called her yesterday for her tele appt. Explained Dr. Sanjuana Mae attempted to call her twice and left message on her VM. States she did not receive any calls yesterday and then said, "that man is a Control and instrumentation engineer." Explained that he would not document attempted calls if he did not make them. She again called him a Control and instrumentation engineer. Explained I would not listen to this. Offered tele appt for this PM, however, she will be at a funeral from 2-4 so will not have phone on. She was then transferred to front office to r/s appt for next week. Discussed situation with Engineer, building services.

## 2022-07-02 ENCOUNTER — Ambulatory Visit (INDEPENDENT_AMBULATORY_CARE_PROVIDER_SITE_OTHER): Payer: Medicare Other

## 2022-07-02 VITALS — BP 109/67 | HR 80 | Temp 98.1°F | Ht 60.55 in | Wt 165.8 lb

## 2022-07-02 DIAGNOSIS — J069 Acute upper respiratory infection, unspecified: Secondary | ICD-10-CM | POA: Insufficient documentation

## 2022-07-02 MED ORDER — GUAIFENESIN-CODEINE 100-10 MG/5ML PO SOLN: 5.0000 mL | Freq: Every evening | ORAL | 0 refills | Status: AC

## 2022-07-02 MED ORDER — FLUTICASONE PROPIONATE 50 MCG/ACT NA SUSP: 2.0000 | Freq: Every day | NASAL | 0 refills | Status: DC

## 2022-07-02 NOTE — Patient Instructions (Addendum)
[name], it was a pleasure seeing you today!  Today we discussed: Upper respiratory symptoms: Your symptoms are likely related to post-nasal drip resulting from chronic allergies/sinusitis. Please begin using Flonase (two sprays in each nostril once a day) in addition to antihistamine. If you can do some nasal irrigation beforehand, that will likely enhance the effect of the Flonase. Additionally, I will provide a prescription for codeine cough syrup to take 5 ml nightly as needed for cough. Please do not drink alcohol or drive when taking this.  I have ordered the following labs today:  Lab Orders  No laboratory test(s) ordered today     Tests ordered today:  none  Referrals ordered today:   Referral Orders  No referral(s) requested today     I have ordered the following medication/changed the following medications:   Stop the following medications: There are no discontinued medications.   Start the following medications: Meds ordered this encounter  Medications   guaiFENesin-codeine 100-10 MG/5ML syrup    Sig: Take 5 mLs by mouth at bedtime. Please take as needed for nighttime cough    Dispense:  120 mL    Refill:  0   fluticasone (FLONASE) 50 MCG/ACT nasal spray    Sig: Place 2 sprays into both nostrils daily.    Dispense:  15.8 mL    Refill:  0     Follow-up:  prn    Please make sure to arrive 15 minutes prior to your next appointment. If you arrive late, you may be asked to reschedule.   We look forward to seeing you next time. Please call our clinic at [phone] if you have any questions or concerns. The best time to call is Monday-Friday from 9am-4pm, but there is someone available 24/7. If after hours or the weekend, call the main hospital number and ask for the Internal Medicine Resident On-Call. If you need medication refills, please notify your pharmacy one week in advance and they will send [place] a request.  Thank you for letting us take part in your  care. Wishing you the best!  Thank you, Linward Natal, MD

## 2022-07-02 NOTE — Progress Notes (Signed)
CC: cough  HPI:  [name] is a 71 y.o. with past medical history as below who presents for cough.  Past Medical History:  Diagnosis Date   AF (atrial fibrillation) ([place]) 10/12/2015   Allergic rhinitis    Anxiety    ANXIETY DISORDER, GENERALIZED 08/18/2006   Qualifier: Diagnosis of  By: [name] MD, [name]     Arthritis    no per pt   Atrial fibrillation with RVR ([place])    Cancer ([place])    left lower eyelid, basal cell cancer   Chiari I malformation ([place]) 03/07/2016   pt unsure of this   Chronic sinusitis    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   COPD (chronic obstructive pulmonary disease) ([place])    no per pt   Depression    treated at [facility]   GERD (gastroesophageal reflux disease)    Goiter    hx of goiter   Hx of atrial tachycardia since age 72 03/15/2013   Memory loss 12/31/2016   Mnire's disease 03/07/2016   Palpitations    Associated with difficulty breathing   Panic attacks    Poisoning by selective serotonin reuptake inhibitors(969.03) Jan 2007   severe reaction with MAOI (nardil) and dextromethorphan   Postmenopausal 12/31/2016   SVT (supraventricular tachycardia) 03/15/2013   History of SVT s/p afib ablation, 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 Following with cardiology.    Swollen gland 09/14/2018   Tachycardia    Tonsillitis 11/12/2018   Review of Systems:  See detailed assessment and plan for pertinent ROS.  Physical Exam:  Vitals:   07/02/22 0924  BP: 109/67  Pulse: 80  Temp: 98.1 F (36.7 C)  TempSrc: Oral  SpO2: 97%  Weight: 165 lb 12.8 oz (75.2 kg)  Height: 5' 0.55" (1.538 m)   Physical Exam Constitutional:      General: She is not in acute distress. HENT:     Head: Normocephalic and atraumatic.     Nose: Congestion present.     Mouth/Throat:     Mouth: Mucous membranes are moist.     Pharynx: Oropharynx is clear.  Eyes:     Extraocular Movements: Extraocular movements intact.      Conjunctiva/sclera: Conjunctivae normal.  Cardiovascular:     Rate and Rhythm: Normal rate and regular rhythm.     Pulses: Normal pulses.  Pulmonary:     Effort: Pulmonary effort is normal. No respiratory distress.     Breath sounds: Normal breath sounds. No wheezing, rhonchi or rales.  Musculoskeletal:     Cervical back: Neck supple.  Neurological:     Mental Status: She is alert and oriented to person, place, and time.  Psychiatric:        Mood and Affect: Mood normal.        Behavior: Behavior normal.      Assessment & Plan:   See Encounters Tab for problem based charting.  Upper respiratory infection, viral Assessment & Plan: Patient presents with a little over a week of cough productive of Katherine Syme-yellow sputum, more recent hoarseness, congestion, and shortness of breath that does not worsen on exertion. She endorses long history of allergies and takes antihistamine but not nasal spray. Reports cough is worse at night. Denies sore throat, fever, chills. Denies chest pain. Vitals wnl. Lungs clear on auscultation. No appreciable pharyngeal or nasal erythema but patient does have cough that sounds like upper airway congestion. Likely viral URI and/or upper airway irritation from preexisting post-nasal drip.  No need for chest xray, labwork given clinical picture and benign lung exam.  Plan: Continue antihistamine, add Flonase. Advised nasal irrigation. Provided prescription for guaifenesin-codeine syrup 5 ml nightly - discussed precautions. Expect symptoms to improve with above care but advised return precautions.   Other orders -     guaiFENesin-Codeine; Take 5 mLs by mouth at bedtime. Please take as needed for nighttime cough  Dispense: 120 mL; Refill: 0 -     Fluticasone Propionate; Place 2 sprays into both nostrils daily.  Dispense: 15.8 mL; Refill: 0     Patient seen with Dr. [name]

## 2022-07-02 NOTE — Assessment & Plan Note (Signed)
Patient presents with a little over a week of cough productive of Yuval Nolet-yellow sputum, more recent hoarseness, congestion, and shortness of breath that does not worsen on exertion. She endorses long history of allergies and takes antihistamine but not nasal spray. Reports cough is worse at night. Denies sore throat, fever, chills. Denies chest pain. Vitals wnl. Lungs clear on auscultation. No appreciable pharyngeal or nasal erythema but patient does have cough that sounds like upper airway congestion. Likely viral URI and/or upper airway irritation from preexisting post-nasal drip. No need for chest xray, labwork given clinical picture and benign lung exam.  Plan: Continue antihistamine, add Flonase. Advised nasal irrigation. Provided prescription for guaifenesin-codeine syrup 5 ml nightly - discussed precautions. Expect symptoms to improve with above care but advised return precautions.

## 2022-07-03 NOTE — Progress Notes (Signed)
Internal Medicine Clinic Attending   I saw and evaluated the patient.  I personally confirmed the key portions of the history and exam documented by Dr. White and I reviewed pertinent patient test results.  The assessment, diagnosis, and plan were formulated together and I agree with the documentation in the resident's note.  

## 2022-07-03 NOTE — Addendum Note (Signed)
Addended by: Lalla Brothers T on: 07/03/2022 01:40 PM   Modules accepted: Level of Service

## 2022-07-25 ENCOUNTER — Encounter: Payer: Medicare Other | Admitting: Internal Medicine

## 2022-08-11 ENCOUNTER — Ambulatory Visit (INDEPENDENT_AMBULATORY_CARE_PROVIDER_SITE_OTHER): Payer: 59 | Admitting: Internal Medicine

## 2022-08-11 ENCOUNTER — Encounter: Payer: Self-pay | Admitting: Internal Medicine

## 2022-08-11 ENCOUNTER — Ambulatory Visit: Payer: 59

## 2022-08-11 ENCOUNTER — Other Ambulatory Visit: Payer: Self-pay

## 2022-08-11 VITALS — BP 107/66 | HR 72 | Temp 98.5°F | Ht 65.5 in | Wt 165.7 lb

## 2022-08-11 DIAGNOSIS — H8109 Meniere's disease, unspecified ear: Secondary | ICD-10-CM | POA: Diagnosis not present

## 2022-08-11 DIAGNOSIS — M189 Osteoarthritis of first carpometacarpal joint, unspecified: Secondary | ICD-10-CM

## 2022-08-11 DIAGNOSIS — I700 Atherosclerosis of aorta: Secondary | ICD-10-CM | POA: Diagnosis not present

## 2022-08-11 DIAGNOSIS — F3341 Major depressive disorder, recurrent, in partial remission: Secondary | ICD-10-CM

## 2022-08-11 DIAGNOSIS — M19041 Primary osteoarthritis, right hand: Secondary | ICD-10-CM

## 2022-08-11 DIAGNOSIS — K648 Other hemorrhoids: Secondary | ICD-10-CM

## 2022-08-11 DIAGNOSIS — M19042 Primary osteoarthritis, left hand: Secondary | ICD-10-CM | POA: Diagnosis not present

## 2022-08-11 DIAGNOSIS — M810 Age-related osteoporosis without current pathological fracture: Secondary | ICD-10-CM

## 2022-08-11 DIAGNOSIS — K644 Residual hemorrhoidal skin tags: Secondary | ICD-10-CM

## 2022-08-11 NOTE — Progress Notes (Unsigned)
Established Patient Office Visit  Subjective   Patient ID: Sheri [name], female    DOB: December 23, 1950  Age: 72 y.o. MRN: [id number]  Chief Complaint  Patient presents with   Follow-up    Check right thumb/wrist.    Sheri [name] is a 73 year old woman with PMH of meniere's osteoporosis, MDD (follows at [facility]).  She is at her normal state of health today.  She notes pain in the [facility], worse on the right, but starting on the left.  She is right handed.  Requesting xrays.     Patient Active Problem List   Diagnosis Date Noted   Osteoarthritis of hands, bilateral 08/13/2022   BPV (benign positional vertigo) 04/30/2022   Eyelid lesion 04/02/2022   Vaginal atrophy 01/17/2022   Varicose veins of both lower extremities with pain 01/17/2022   Atherosclerosis of aorta ([place]) 01/17/2022   Skin lesion of back 06/28/2021   Internal and external hemorrhoids without complication 73/22/0254   Recurrent major depressive disorder, in partial remission ([place]) 08/10/2020   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   Osteoporosis 01/26/2018   Postmenopausal 12/31/2016   Poor dentition 12/31/2016   Mnire's disease 03/07/2016   Chiari I malformation ([place]) 03/07/2016   Weight gain 09/21/2015   Healthcare maintenance 04/14/2013   SVT (supraventricular tachycardia) 03/15/2013      Review of Systems  Constitutional:  Negative for chills, fever and weight loss.  Respiratory:  Negative for cough and shortness of breath.   Cardiovascular:  Negative for chest pain and claudication.  Musculoskeletal:  Positive for joint pain.  Neurological:  Positive for dizziness (chronic).      Objective:     BP 107/66 (BP Location: Left Arm, Patient Position: Sitting, Cuff Size: Normal)   Pulse 72   Temp 98.5 F (36.9 C) (Oral)   Ht 5' 5.5" (1.664 m)   Wt 165 lb 11.2 oz (75.2 kg)   SpO2 98% Comment: RA  BMI 27.15 kg/m  BP Readings from Last 3 Encounters:  08/11/22 107/66  07/02/22 109/67  04/30/22  118/67   Wt Readings from Last 3 Encounters:  08/11/22 165 lb 11.2 oz (75.2 kg)  07/02/22 165 lb 12.8 oz (75.2 kg)  04/30/22 161 lb 9.6 oz (73.3 kg)      Physical Exam Vitals and nursing note reviewed.  Constitutional:      General: She is not in acute distress.    Appearance: Normal appearance.  Cardiovascular:     Rate and Rhythm: Normal rate.  Pulmonary:     Effort: Pulmonary effort is normal. No respiratory distress.  Musculoskeletal:        General: Tenderness present.     Comments: She has enlarged joints of the PIP and DIP of both hands.  She has TTP to the first [facility] joint on the right and some on the left.  Finkelstein's in negative bilaterally.  Tinel's test is negative.  She has good  sensation of the fingers.   Neurological:     Mental Status: She is alert.  Psychiatric:        Mood and Affect: Mood normal.        Behavior: Behavior normal.    Xrays > 98 years old show OA of bilateral hands   Results for orders placed or performed in visit on 08/11/22  BMP8+Anion Gap  Result Value Ref Range   Glucose 86 70 - 99 mg/dL   BUN 10 8 - 27 mg/dL   Creatinine, Ser 0.85  0.57 - 1.00 mg/dL   eGFR 73 >59 mL/min/1.73   BUN/Creatinine Ratio 12 12 - 28   Sodium 142 134 - 144 mmol/L   Potassium 4.5 3.5 - 5.2 mmol/L   Chloride 105 96 - 106 mmol/L   CO2 24 20 - 29 mmol/L   Anion Gap 13.0 10.0 - 18.0 mmol/L   Calcium 9.3 8.7 - 10.3 mg/dL  Lipid Profile  Result Value Ref Range   Cholesterol, Total 210 (H) 100 - 199 mg/dL   Triglycerides 121 0 - 149 mg/dL   HDL 50 >39 mg/dL   VLDL Cholesterol Cal 22 5 - 40 mg/dL   LDL Chol Calc (NIH) 138 (H) 0 - 99 mg/dL   Chol/HDL Ratio 4.2 0.0 - 4.4 ratio  CBC no Diff  Result Value Ref Range   WBC 7.5 3.4 - 10.8 x10E3/uL   RBC 4.50 3.77 - 5.28 x10E6/uL   Hemoglobin 13.6 11.1 - 15.9 g/dL   Hematocrit 41.0 34.0 - 46.6 %   MCV 91 79 - 97 fL   MCH 30.2 26.6 - 33.0 pg   MCHC 33.2 31.5 - 35.7 g/dL   RDW 12.0 11.7 - 15.4 %   Platelets  354 150 - 450 x10E3/uL       Assessment & Plan:   Problem List Items Addressed This Visit       Unprioritized   Mnire's disease (Chronic)    Chronic and stable.  Continue meclizine.       Internal and external hemorrhoids without complication    No issues reported today.  Check CBC      Relevant Orders   CBC no Diff (Completed)   Recurrent major depressive disorder, in partial remission (Sheri [name])    Follows with [facility].  No issues with provider.  Would like blood work checked.       Atherosclerosis of aorta ([facility])    Reports no chest pain.  Check lipid panel.       Relevant Orders   BMP8+Anion Gap (Completed)   Lipid Profile (Completed)   Osteoarthritis of hands, bilateral    Seen on imaging in the past.  She would like updated imaging.  I think she likely has progressive OA of the hands, possibly erosive OA.  Xrays will help differentiate.  Treatment will be conservative which I will advise her on once we have the xrays.       Relevant Orders   DG Hand Complete Left   DG Hand Complete Right   DG Wrist Complete Left   DG Wrist Complete Right   Other Visit Diagnoses     Osteoarthritis of first carpometacarpal [facility]) joint of one hand    -  Primary   Relevant Orders   DG Hand Complete Left   DG Hand Complete Right   DG Wrist Complete Left   DG Wrist Complete Right       Return in about 3 months (around 11/10/2022).    Sheri Chiquito, MD

## 2022-08-11 NOTE — Patient Instructions (Signed)
Ms. Widener - -  For the pain in your hands, I will get hand xrays.  I will call you with the results.   Come back to see me in 3-4 months, sooner if needed.

## 2022-08-12 LAB — CBC
Hematocrit: 41 % (ref 34.0–46.6)
Hemoglobin: 13.6 g/dL (ref 11.1–15.9)
MCH: 30.2 pg (ref 26.6–33.0)
MCHC: 33.2 g/dL (ref 31.5–35.7)
MCV: 91 fL (ref 79–97)
Platelets: 354 10*3/uL (ref 150–450)
RBC: 4.5 x10E6/uL (ref 3.77–5.28)
RDW: 12 % (ref 11.7–15.4)
WBC: 7.5 10*3/uL (ref 3.4–10.8)

## 2022-08-12 LAB — BMP8+ANION GAP
Anion Gap: 13 mmol/L (ref 10.0–18.0)
BUN/Creatinine Ratio: 12 (ref 12–28)
BUN: 10 mg/dL (ref 8–27)
CO2: 24 mmol/L (ref 20–29)
Calcium: 9.3 mg/dL (ref 8.7–10.3)
Chloride: 105 mmol/L (ref 96–106)
Creatinine, Ser: 0.85 mg/dL (ref 0.57–1.00)
Glucose: 86 mg/dL (ref 70–99)
Potassium: 4.5 mmol/L (ref 3.5–5.2)
Sodium: 142 mmol/L (ref 134–144)
eGFR: 73 mL/min/{1.73_m2} (ref >59)

## 2022-08-12 LAB — LIPID PANEL
Chol/HDL Ratio: 4.2 ratio (ref 0.0–4.4)
Cholesterol, Total: 210 mg/dL — ABNORMAL HIGH (ref 100–199)
HDL: 50 mg/dL (ref >39)
LDL Chol Calc (NIH): 138 mg/dL — ABNORMAL HIGH (ref 0–99)
Triglycerides: 121 mg/dL (ref 0–149)
VLDL Cholesterol Cal: 22 mg/dL (ref 5–40)

## 2022-08-13 DIAGNOSIS — M19041 Primary osteoarthritis, right hand: Secondary | ICD-10-CM | POA: Insufficient documentation

## 2022-08-13 NOTE — Assessment & Plan Note (Signed)
Chronic and stable.  Continue meclizine.

## 2022-08-13 NOTE — Assessment & Plan Note (Signed)
Follows with [facility].  No issues with provider.  Would like blood work checked.

## 2022-08-13 NOTE — Assessment & Plan Note (Signed)
Reports no chest pain.  Check lipid panel.

## 2022-08-13 NOTE — Assessment & Plan Note (Signed)
No issues reported today.  Check CBC

## 2022-08-13 NOTE — Assessment & Plan Note (Signed)
Seen on imaging in the past.  She would like updated imaging.  I think she likely has progressive OA of the hands, possibly erosive OA.  Xrays will help differentiate.  Treatment will be conservative which I will advise her on once we have the xrays.

## 2022-08-21 ENCOUNTER — Ambulatory Visit: Payer: 59 | Admitting: Cardiology

## 2022-09-02 ENCOUNTER — Other Ambulatory Visit: Payer: Self-pay

## 2022-09-07 ENCOUNTER — Other Ambulatory Visit: Payer: Self-pay

## 2022-09-09 ENCOUNTER — Other Ambulatory Visit: Payer: Self-pay | Admitting: Cardiology

## 2022-09-18 ENCOUNTER — Other Ambulatory Visit: Payer: Self-pay

## 2022-09-19 ENCOUNTER — Other Ambulatory Visit: Payer: Self-pay | Admitting: Student

## 2022-09-19 MED ORDER — ALBUTEROL SULFATE HFA 108 (90 BASE) MCG/ACT IN AERS: 2.0000 | INHALATION_SPRAY | Freq: Four times a day (QID) | RESPIRATORY_TRACT | 2 refills | Status: DC | PRN

## 2022-09-19 NOTE — Progress Notes (Signed)
Paged about med refill request. Patient reports that she is in need of refill of albuterol. Prescription sent to [facility] on [address].

## 2022-10-19 ENCOUNTER — Ambulatory Visit (HOSPITAL_COMMUNITY)
Admission: EM | Admit: 2022-10-19 | Discharge: 2022-10-19 | Disposition: A | Discharge status: Home / Self Care | Payer: 59

## 2022-10-19 ENCOUNTER — Encounter (HOSPITAL_COMMUNITY): Payer: Self-pay

## 2022-10-19 ENCOUNTER — Ambulatory Visit (INDEPENDENT_AMBULATORY_CARE_PROVIDER_SITE_OTHER): Payer: 59

## 2022-10-19 DIAGNOSIS — R059 Cough, unspecified: Secondary | ICD-10-CM

## 2022-10-19 DIAGNOSIS — W109XXA Fall (on) (from) unspecified stairs and steps, initial encounter: Secondary | ICD-10-CM | POA: Diagnosis not present

## 2022-10-19 DIAGNOSIS — M25572 Pain in left ankle and joints of left foot: Secondary | ICD-10-CM | POA: Diagnosis not present

## 2022-10-19 DIAGNOSIS — R062 Wheezing: Secondary | ICD-10-CM | POA: Diagnosis not present

## 2022-10-19 DIAGNOSIS — W108XXA Fall (on) (from) other stairs and steps, initial encounter: Secondary | ICD-10-CM

## 2022-10-19 DIAGNOSIS — S82142A Displaced bicondylar fracture of left tibia, initial encounter for closed fracture: Secondary | ICD-10-CM | POA: Diagnosis not present

## 2022-10-19 DIAGNOSIS — M25562 Pain in left knee: Secondary | ICD-10-CM | POA: Diagnosis not present

## 2022-10-19 DIAGNOSIS — W19XXXA Unspecified fall, initial encounter: Secondary | ICD-10-CM

## 2022-10-19 DIAGNOSIS — R0602 Shortness of breath: Secondary | ICD-10-CM | POA: Diagnosis not present

## 2022-10-19 DIAGNOSIS — J209 Acute bronchitis, unspecified: Secondary | ICD-10-CM

## 2022-10-19 DIAGNOSIS — R053 Chronic cough: Secondary | ICD-10-CM

## 2022-10-19 DIAGNOSIS — Z043 Encounter for examination and observation following other accident: Secondary | ICD-10-CM | POA: Diagnosis not present

## 2022-10-19 MED ORDER — METHYLPREDNISOLONE SODIUM SUCC 125 MG IJ SOLR
80.0000 mg | Freq: Once | INTRAMUSCULAR | Status: AC
  Administered 2022-10-19: 80 mg via INTRAMUSCULAR

## 2022-10-19 MED ORDER — METHYLPREDNISOLONE SODIUM SUCC 125 MG IJ SOLR
INTRAMUSCULAR | Status: AC
  Filled 2022-10-19: qty 2

## 2022-10-19 MED ORDER — ALBUTEROL SULFATE HFA 108 (90 BASE) MCG/ACT IN AERS: 1.0000 | INHALATION_SPRAY | Freq: Four times a day (QID) | RESPIRATORY_TRACT | 0 refills | Status: DC | PRN

## 2022-10-19 MED ORDER — HYDROCODONE-ACETAMINOPHEN 5-325 MG PO TABS: 1.0000 | ORAL_TABLET | Freq: Four times a day (QID) | ORAL | 0 refills | Status: AC | PRN

## 2022-10-19 NOTE — ED Provider Notes (Signed)
[facility]    CSN: [id number] Arrival date & time: 10/19/22  1337      History   Chief Complaint Chief Complaint  Patient presents with   Fall    HPI Sheri [name] is a 72 y.o. female.   Patient presents to urgent care for evaluation after fall down 6 concrete steps last night.  She landed onto her left side and is currently complaining of left ankle, left knee, and left hip pain. No preceding dizziness, nausea, vomiting, chest pain, shortness of breath, or heart palpitations. Pain to the left lower extremity is worse with weightbearing. She is experiencing significant pain to the distal anterior left knee that is much worse with weightbearing and extension at the knee joint. Denies history of previous injury to the knee. No numbness/tingling distal to injuries. She did not hit her head, pass out, or vomit after falling. She does not use blood thinners. Has been taking tylenol for pain without relief.   She would also like to be evaluated for persistent cough for the last 3 weeks. Cough is dry, non prodcutive, and worse at nighttime. She has never been a smoker but has had passive second hand exposure to cigarette smoke. No fever, chills, shortness of breath, dizziness, heart palpitations, chest pain, orthopnea, leg swelling, abdominal pain, body aches, ear pain, headache, sore throat, or rhinorrhea. No history of chronic respiratory problems to her knowledge. Per char review, history of COPD. No recent antibiotics or steroids. No recent known sick contacts with similar symptoms.    Fall    Past Medical History:  Diagnosis Date   AF (atrial fibrillation) (HCC) 10/12/2015   Allergic rhinitis    Anxiety    ANXIETY DISORDER, GENERALIZED 08/18/2006   Qualifier: Diagnosis of  By: Sherlon Handing MD, Larene Pickett     Arthritis    no per pt   Atrial fibrillation with RVR (HCC)    Cancer (HCC)    left lower eyelid, basal cell cancer   Chiari I malformation (HCC) 03/07/2016   pt unsure  of this   Chronic sinusitis    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   COPD (chronic obstructive pulmonary disease) (HCC)    no per pt   Depression    treated at [facility]   GERD (gastroesophageal reflux disease)    Goiter    hx of goiter   Hx of atrial tachycardia since age 89 03/15/2013   Memory loss 12/31/2016   Mnire's disease 03/07/2016   Palpitations    Associated with difficulty breathing   Panic attacks    Poisoning by selective serotonin reuptake inhibitors(969.03) Jan 2007   severe reaction with MAOI (nardil) and dextromethorphan   Postmenopausal 12/31/2016   SVT (supraventricular tachycardia) 03/15/2013   History of SVT s/p afib ablation, 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 Following with cardiology.    Swollen gland 09/14/2018   Tachycardia    Tonsillitis 11/12/2018    Patient Active Problem List   Diagnosis Date Noted   Osteoarthritis of hands, bilateral 08/13/2022   BPV (benign positional vertigo) 04/30/2022   Eyelid lesion 04/02/2022   Vaginal atrophy 01/17/2022   Varicose veins of both lower extremities with pain 01/17/2022   Atherosclerosis of aorta 01/17/2022   Skin lesion of back 06/28/2021   Internal and external hemorrhoids without complication 08/10/2020   Recurrent major depressive disorder, in partial remission 08/10/2020   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   Osteoporosis 01/26/2018   Postmenopausal 12/31/2016   Poor  dentition 12/31/2016   Mnire's disease 03/07/2016   Chiari I malformation 03/07/2016   Weight gain 09/21/2015   Healthcare maintenance 04/14/2013   SVT (supraventricular tachycardia) 03/15/2013    Past Surgical History:  Procedure Laterality Date   APPENDECTOMY     COLONOSCOPY     ELECTROPHYSIOLOGIC STUDY N/A 10/12/2015   Procedure: Atrial Fibrillation Ablation;  Surgeon: Will [name], MD;  Location: [facility];  Service: Cardiovascular;  Laterality: N/A;   LID LESION  EXCISION Left 08/06/2017   Procedure: EXCISION BIOPSY OF EYELID, RECONSTRUCTION OF EYELID;  Surgeon: Floydene Flock, MD;  Location: [facility] OR;  Service: Ophthalmology;  Laterality: Left;   OVARIAN CYST REMOVAL  09/1967   left ovarian cystectomy- 17.5 lb cyst removed- per pt   right mastoidectomy     ET tube placement in 1980 and 1990   right sinus surgery  october 2000   SVT ABLATION N/A 07/25/2020   Procedure: SVT ABLATION;  Surgeon: Regan Lemming, MD;  Location: [facility];  Service: Cardiovascular;  Laterality: N/A;   SVT ABLATION N/A 10/26/2020   Procedure: SVT ABLATION;  Surgeon: Regan Lemming, MD;  Location: [facility];  Service: Cardiovascular;  Laterality: N/A;   THYROIDECTOMY, PARTIAL     nodule removal - April 1996    OB History     Gravida  4   Para  3   Term  3   Preterm      AB  1   Living  3      SAB  1   IAB      Ectopic      Multiple      Live Births               Home Medications    Prior to Admission medications   Medication Sig Start Date End Date Taking? Authorizing Provider  albuterol (VENTOLIN HFA) 108 (90 Base) MCG/ACT inhaler Inhale 2 puffs into the lungs every 6 (six) hours as needed for wheezing or shortness of breath. 09/19/22  Yes Marrianne Mood, MD  albuterol (VENTOLIN HFA) 108 (90 Base) MCG/ACT inhaler Inhale 1-2 puffs into the lungs every 6 (six) hours as needed for wheezing or shortness of breath. 10/19/22  Yes Carlisle Beers, FNP  meclizine (ANTIVERT) 25 MG tablet Take 1 tablet (25 mg total) by mouth 3 (three) times daily as needed for dizziness. 05/25/22  Yes Inez Catalina, MD  propranolol (INDERAL) 20 MG tablet Take 1 tablet by mouth twice daily 09/10/22  Yes Camnitz, Will Daphine Deutscher, MD  QUEtiapine (SEROQUEL) 200 MG tablet Take 100 mg by mouth at bedtime.   Yes [provider]  venlafaxine (EFFEXOR) 75 MG tablet Take by mouth. 07/17/17  Yes [provider]  venlafaxine XR  (EFFEXOR-XR) 75 MG 24 hr capsule Take 150 mg by mouth daily. Taking  total per day. 09/20/20  Yes [provider]  alendronate (FOSAMAX) 70 MG tablet TAKE 1 TABLET BY MOUTH ONCE A WEEK TAKE  WITH  A  FULL  GLASS  OF  WATER  ON  AN  EMPTY  STOMACH 04/02/22   Inez Catalina, MD  diltiazem (CARDIZEM CD) 180 MG 24 hr capsule Take by mouth. 09/15/18   [provider]  fluticasone (FLONASE) 50 MCG/ACT nasal spray Use 2 spray(s) in each nostril once daily 09/05/22   Inez Catalina, MD  guaiFENesin-codeine 100-10 MG/5ML syrup Take 5 mLs by mouth at bedtime. Please take  as needed for nighttime cough 07/02/22 10/30/22  Adron Bene, MD  hydrocortisone 2.5 % cream Apply topically daily as needed (Pain from hemorrhoids). 10/18/21   Inez Catalina, MD  oxyCODONE (ROXICODONE) 5 MG immediate release tablet Take 0.5-1 tablets (2.5-5 mg total) by mouth every 4 (four) hours as needed for up to 5 days for severe pain. 10/22/22 10/27/22  London Sheer, MD  pantoprazole (PROTONIX) 20 MG tablet Take by mouth. 09/14/18   [provider]  traZODone (DESYREL) 100 MG tablet Take 100 mg by mouth at bedtime.    [provider]    Family History Family History  Problem Relation Age of Onset   Hypertension Mother    Hypertension Father    Psoriasis Father    Heart attack Father    Atrial fibrillation Sister        had a successful a fib ablation   Atrial fibrillation Sister        had a successful a fib ablation   Breast cancer Niece 28   Colon cancer Neg Hx    Esophageal cancer Neg Hx    Stomach cancer Neg Hx    Rectal cancer Neg Hx    Colon polyps Neg Hx     Social History Social History   Tobacco Use   Smoking status: Never    Passive exposure: Current   Smokeless tobacco: Never  Vaping Use   Vaping Use: Never used  Substance Use Topics   Alcohol use: No    Alcohol/week: 0.0 standard drinks of alcohol   Drug use: No     Allergies   Guaifenesin   Review of  Systems Review of Systems Per HPI  Physical Exam Triage Vital Signs ED Triage Vitals  Enc Vitals Group     BP 10/19/22 1420 108/69     Pulse Rate 10/19/22 1420 77     Resp 10/19/22 1420 20     Temp 10/19/22 1420 98.4 F (36.9 C)     Temp Source 10/19/22 1420 Oral     SpO2 10/19/22 1420 91 %     Weight 10/19/22 1415 160 lb (72.6 kg)     Height 10/19/22 1415 5' 5.5" (1.664 m)     Head Circumference --      Peak Flow --      Pain Score 10/19/22 1415 10     Pain Loc --      Pain Edu? --      Excl. in GC? --    No data found.  Updated Vital Signs BP 108/69 (BP Location: Left Arm)   Pulse 77   Temp 98.4 F (36.9 C) (Oral)   Resp 20   Ht 5' 5.5" (1.664 m)   Wt 160 lb (72.6 kg)   SpO2 91%   BMI 26.22 kg/m   Visual Acuity Right Eye Distance:   Left Eye Distance:   Bilateral Distance:    Right Eye Near:   Left Eye Near:    Bilateral Near:     Physical Exam Vitals and nursing note reviewed.  Constitutional:      Appearance: She is not ill-appearing or toxic-appearing.     Comments: Seated in wheelchair due to leg pain. Appears stated age. Resting in position of comfort.  HENT:     Head: Normocephalic and atraumatic.     Right Ear: Hearing, tympanic membrane, ear canal and external ear normal.     Left Ear: Hearing, tympanic membrane, ear canal and external ear  normal.     Nose: Nose normal.     Mouth/Throat:     Lips: Pink.     Mouth: Mucous membranes are moist. No injury.     Tongue: No lesions. Tongue does not deviate from midline.     Palate: No mass and lesions.     Pharynx: Oropharynx is clear. Uvula midline. No pharyngeal swelling, oropharyngeal exudate, posterior oropharyngeal erythema or uvula swelling.     Tonsils: No tonsillar exudate or tonsillar abscesses.  Eyes:     General: Lids are normal. Vision grossly intact. Gaze aligned appropriately.     Extraocular Movements: Extraocular movements intact.     Conjunctiva/sclera: Conjunctivae normal.   Cardiovascular:     Rate and Rhythm: Normal rate and regular rhythm.     Heart sounds: Normal heart sounds, S1 normal and S2 normal.  Pulmonary:     Effort: Pulmonary effort is normal. No respiratory distress.     Breath sounds: Normal air entry. Wheezing present. No rhonchi or rales.  Chest:     Chest wall: No tenderness.  Musculoskeletal:     Cervical back: Normal and neck supple. No swelling, deformity, erythema, tenderness or crepitus. No pain with movement. Normal range of motion.     Thoracic back: Normal.     Lumbar back: Normal.     Right hip: Normal.     Left hip: Normal.     Right upper leg: Normal.     Left upper leg: Normal.     Right knee: Normal.     Left knee: Bony tenderness and crepitus present. No swelling, deformity, effusion, erythema, ecchymosis or lacerations. Decreased range of motion (with extension (able to extend to 165-170 but then very tender)). Tenderness (generalized to the anterior left knee) present over the medial joint line, lateral joint line and patellar tendon. Normal alignment. Normal pulse.     Right lower leg: No edema.     Left lower leg: No edema.     Right ankle: Normal.     Left ankle: Swelling present. No deformity, ecchymosis or lacerations. Tenderness present over the lateral malleolus. No medial malleolus tenderness. Normal range of motion. Anterior drawer test negative. Normal pulse.     Left Achilles Tendon: Normal.     Comments: No limb shortening or abnormal rotation of unilateral leg. Strength and sensation intact to BLE. No ecchymosis or skin changes. +2 DP pulses bilaterally.   Lymphadenopathy:     Cervical: No cervical adenopathy.  Skin:    General: Skin is warm and dry.     Capillary Refill: Capillary refill takes less than 2 seconds.     Findings: No rash.  Neurological:     General: No focal deficit present.     Mental Status: She is alert and oriented to person, place, and time. Mental status is at baseline.     Cranial  Nerves: No dysarthria or facial asymmetry.  Psychiatric:        Mood and Affect: Mood normal.        Speech: Speech normal.        Behavior: Behavior normal.        Thought Content: Thought content normal.        Judgment: Judgment normal.      UC Treatments / Results  Labs (all labs ordered are listed, but only abnormal results are displayed) Labs Reviewed - No data to display  EKG   Radiology No results found. EXAM: LEFT KNEE - COMPLETE 4+ VIEW  COMPARISON:  None Available.   FINDINGS: There is a joint effusion. Lucency of the subchondral bone in the lateral tibial plateau region could represent a fracture or evidence of avascular necrosis. Given the recent fall, fractures favored.   IMPRESSION: Joint effusion. Lucency of the subchondral bone in the lateral tibial plateau region that could represent a fracture or evidence of avascular necrosis. Given the recent fall, fracture is favored.   EXAM: LEFT ANKLE COMPLETE - 3+ VIEW   COMPARISON:  None Available.   FINDINGS: There is no evidence of fracture, dislocation, or joint effusion. There is no evidence of arthropathy or other focal bone abnormality. Soft tissues are unremarkable.   IMPRESSION: Negative.  EXAM: CHEST - 2 VIEW   COMPARISON:  03/24/2013   FINDINGS: Heart size is normal. There is atherosclerosis and tortuosity of the thoracic aorta. Chronic pleural and parenchymal scarring remains visible at both apices. Interstitial prominence in the lower lobes is newly seen since the prior study. No evidence of pleural fluid accumulation. This is favored to represent scarring rather than interstitial edema. No focal lesion. No significant bone finding.   IMPRESSION: 1. Chronic pleural and parenchymal scarring at both apices. 2. Interstitial prominence in the lower lobes newly seen since the prior study. This is favored to represent scarring rather than interstitial  edema.    Procedures Procedures (including critical care time)  Medications Ordered in UC Medications  methylPREDNISolone sodium succinate (SOLU-MEDROL) 125 mg/2 mL injection 80 mg (80 mg Intramuscular Given 10/19/22 1614)    Initial Impression / Assessment and Plan / UC Course  I have reviewed the triage vital signs and the nursing notes.  Pertinent labs & imaging results that were available during my care of the patient were reviewed by me and considered in my medical decision making (see chart for details).   1. Fall, acute pain of left knee X-ray of left ankle negative. X-ray of left knee shows "Lucency of the subchondral bone in the lateral tibial plateau region that could represent a fracture or evidence of avascular necrosis. Given the recent fall, fracture is favored." Patient placed in knee immobilizer and advised non-weight bearing until further evaluation by orthopedic provider. Walking referral to orthopedics provided, encouraged to call to schedule an appointment within the next 3-5 days for follow-up. Patient is not a candidate for NSAIDs. Will manage pain related to fracture with RICE, tylenol 650mg  every 6 hours as needed, and hydrocodone-acetaminophen 1 pill every 6 hours as needed for severe/breakthrough pain. Drowsiness precautions discussed regarding use of hydrocodone, discussed limit to acetaminophen in one day as well (4,000mg ). She is neurovascularly intact distal to injury and well perfused.  2. Acute bronchitis, persistent cough for 3 weeks or longer Chest x-ray negative for acute cardiopulmonary abnormality and shows stable chronic changes. Solumedrol 80mg  IM given for diffuse wheezing. Albuterol inhaler prescribed to be used as prescribed for 24 hours, then as needed for cough, shortness of breath, and wheeze. Recommend follow-up with PCP in 1-2 weeks to ensure resolution of symptoms.   Discussed physical exam and available lab work findings in clinic with patient.   Counseled patient regarding appropriate use of medications and potential side effects for all medications recommended or prescribed today. Discussed red flag signs and symptoms of worsening condition,when to call the PCP office, return to urgent care, and when to seek higher level of care in the emergency department. Patient verbalizes understanding and agreement with plan. All questions answered. Patient discharged in stable condition.   Final  Clinical Impressions(s) / UC Diagnoses   Final diagnoses:  Fall, initial encounter  Acute pain of left knee  Persistent cough for 3 weeks or longer  Acute bronchitis, unspecified organism     Discharge Instructions      Your x-rays of your ankle are negative.  Your x-ray of your chest shows stable, however chronic changes without pneumonia.  This is very reassuring. You may continue using albuterol as needed. I gave you a shot of steroid in the clinic to help reduce inflammation in both your chest and your leg related to injury.  Continue to elevate and apply ice to your injuries.  Your x-ray of your left knee shows of possible fracture to your knee. Please wear the knee brace I provided and stay off of your left leg.  If you need to walk, please use the crutches. Please schedule an appointment with the orthopedic provider listed on your paperwork in the next week.  If you need to, I have sent in hydrocodone pain medicine for you to use for severe pain.  Do not drive, drink alcohol, or go to work when taking this medicine as it can make you sleepy. Once your pain is well-controlled with tramadol, switch back to only using Tylenol 650 mg every 6 hours as needed for pain. The strong pain medicine has tylenol in it, so make sure you do not take over 4,000mg  of tylenol in one day.  If you develop any new or worsening symptoms or do not improve in the next 2 to 3 days, please return.  If your symptoms are severe, please go to the emergency room.   Follow-up with your primary care provider for further evaluation and management of your symptoms as well as ongoing wellness visits.  I hope you feel better!     ED Prescriptions     Medication Sig Dispense Auth. Provider   albuterol (VENTOLIN HFA) 108 (90 Base) MCG/ACT inhaler Inhale 1-2 puffs into the lungs every 6 (six) hours as needed for wheezing or shortness of breath. 8 g Reita May M, FNP   HYDROcodone-acetaminophen (NORCO/VICODIN) 5-325 MG tablet Take 1 tablet by mouth every 6 (six) hours as needed for up to 2 days. 8 tablet Carlisle Beers, FNP      I have reviewed the PDMP during this encounter.   Carlisle Beers, [place] 10/24/22 1956

## 2022-10-19 NOTE — Discharge Instructions (Addendum)
Your x-rays of your ankle are negative.  Your x-ray of your chest shows stable, however chronic changes without pneumonia.  This is very reassuring. You may continue using albuterol as needed. I gave you a shot of steroid in the clinic to help reduce inflammation in both your chest and your leg related to injury.  Continue to elevate and apply ice to your injuries.  Your x-ray of your left knee shows of possible fracture to your knee. Please wear the knee brace I provided and stay off of your left leg.  If you need to walk, please use the crutches. Please schedule an appointment with the orthopedic provider listed on your paperwork in the next week.  If you need to, I have sent in hydrocodone pain medicine for you to use for severe pain.  Do not drive, drink alcohol, or go to work when taking this medicine as it can make you sleepy. Once your pain is well-controlled with tramadol, switch back to only using Tylenol 650 mg every 6 hours as needed for pain. The strong pain medicine has tylenol in it, so make sure you do not take over 4,000mg  of tylenol in one day.  If you develop any new or worsening symptoms or do not improve in the next 2 to 3 days, please return.  If your symptoms are severe, please go to the emergency room.  Follow-up with your primary care provider for further evaluation and management of your symptoms as well as ongoing wellness visits.  I hope you feel better!

## 2022-10-19 NOTE — ED Triage Notes (Signed)
"  I fell down about 6 concrete steps, landing on left side from hip down". Most of pain is outer areas of leg with swelling of ankle. DOI: [id number]. Time: "a little after 3pm". "I was at [employer] for [employer] No head injury. No abrasions/lacerations.

## 2022-10-22 ENCOUNTER — Other Ambulatory Visit (INDEPENDENT_AMBULATORY_CARE_PROVIDER_SITE_OTHER): Payer: 59

## 2022-10-22 ENCOUNTER — Ambulatory Visit (INDEPENDENT_AMBULATORY_CARE_PROVIDER_SITE_OTHER): Payer: 59 | Admitting: Orthopedic Surgery

## 2022-10-22 ENCOUNTER — Ambulatory Visit: Payer: 59 | Admitting: Orthopedic Surgery

## 2022-10-22 DIAGNOSIS — S82142A Displaced bicondylar fracture of left tibia, initial encounter for closed fracture: Secondary | ICD-10-CM

## 2022-10-22 DIAGNOSIS — M25562 Pain in left knee: Secondary | ICD-10-CM

## 2022-10-22 MED ORDER — OXYCODONE HCL 5 MG PO TABS: 2.5000 mg | ORAL_TABLET | ORAL | 0 refills | Status: AC | PRN

## 2022-10-22 NOTE — Progress Notes (Addendum)
Orthopedic Surgery Office Note  Assessment: Patient is a 72 y.o. female with left knee pain after a fall. Has a left lateral tibial plateau fracture Date of injury: 10/19/2022 (~3 days from injury)   Plan: -Pain control: oxycodone prescribed -Told her to be nonweightbearing until I can review CT.  She already has crutches.  May advance her weightbearing if CT is consistent with a stable fracture -Recommended CT scan of the knee to evaluate the fracture -I will call patient with the results of the CT and discuss the next steps in management. Will have her schedule a follow up appt for 2 weeks as well. XR at next visit: AP/lateral left knee   Patient expressed understanding of the plan and all questions were answered to the patient's satisfaction.   ___________________________________________________________________________   History:  Patient is a 73 y.o. female who presents today for left knee pain.  Patient had a fall down concrete stairs and noted acute onset of left leg pain.  Felt pain in the left knee and the left leg.  No pain elsewhere.  Went to the emergency department and was given utilizer.  States that her knee swelled up significantly after the injury.  Swelling has improved with time.  She has been weightbearing with a cane.  Denies paresthesia numbness.  Treatments tried: Norco, knee immobilizer  Review of systems: Denies fevers and chills, night sweats, unexplained weight loss, history of cancer, pain that wakes them at night  Past medical history: Atrial fibrillation Depression/anxiety Basal cell carcinoma status post excision Osteoarthritis Allergic rhinitis GERD Mnire's disease Panic attacks  Allergies: Guaifenesin  Past surgical history:  Appendectomy Eyelid lesion excision Right mastoidectomy Left ovarian cyst removal SVT ablation Partial thyroidectomy  Social history: Denies use of nicotine product (smoking, vaping, patches, smokeless) Alcohol  use: denies Denies recreational drug use   Physical Exam:  General: no acute distress, appears stated age Neurologic: alert, answering questions appropriately, following commands Respiratory: unlabored breathing on room air, symmetric chest rise Psychiatric: appropriate affect, normal cadence to speech   MSK (LLE): -Tender to palpation over the left knee especially laterally and over the lateral aspect of the left tibia, no other tenderness palpation, no open wounds, EHL/TA/GSC intact, sensation intact to light touch in sural/saphenous/deep peroneal/superficial peroneal/tibial nerve distributions, foot warm and well-perfused  Imaging: XR of the left knee were taken/09/2022 and independently interpreted and reviewed, showing lucency along the lateral aspect of the lateral joint surface. No joint depression seen.  No other fracture seen.   Patient name: Sheri [name] Patient MRN: [id number] Date of visit: 10/22/22

## 2022-10-27 ENCOUNTER — Telehealth: Payer: Self-pay | Admitting: Radiology

## 2022-10-27 NOTE — Telephone Encounter (Signed)
Patient called and spoke with Crystal at the front stating Dr. Christell Constant did not send in her pain medication. Per chart, Oxycodone 5mg  was received by pharmacy on 10/22/2022.  I called [facility] at [employer] to ask about the medication. Per the pharmacy associate, they did receive the prescription, however, needed a diagnosis to fill. I explained that patient has tibial plateau fracture and needs medication. Per associate, patient is now on the line with someone else there and pharmacist is currently filling prescription.  I called patient to advise and explain medication prescription was received by pharmacy and that, per chart, we had not heard from them requesting additional information. Had to leave voicemail. Did explain that pharmacy was currently filling and asked for return call if she needed anything else.   Sending to you as patient was apparently extremely upset when she called and felt we neglected her. I was unable to speak with her directly. Just wanted you to be aware in case she calls back and does not ask for me.

## 2022-10-30 ENCOUNTER — Other Ambulatory Visit: Payer: 59

## 2022-10-31 ENCOUNTER — Ambulatory Visit
Admission: RE | Admit: 2022-10-31 | Discharge: 2022-10-31 | Disposition: A | Discharge status: Home / Self Care | Payer: 59 | Source: Ambulatory Visit | Attending: Orthopedic Surgery | Admitting: Orthopedic Surgery

## 2022-10-31 ENCOUNTER — Telehealth: Payer: Self-pay | Admitting: Orthopedic Surgery

## 2022-10-31 DIAGNOSIS — M25562 Pain in left knee: Secondary | ICD-10-CM | POA: Diagnosis not present

## 2022-10-31 DIAGNOSIS — S82142A Displaced bicondylar fracture of left tibia, initial encounter for closed fracture: Secondary | ICD-10-CM

## 2022-10-31 NOTE — Telephone Encounter (Signed)
Patient called. Says the pain medication isn't working. Still having pain. Her call back number is [phone]

## 2022-10-31 NOTE — Telephone Encounter (Signed)
Orthopedic Note  I called the patient this afternoon and left a voicemail.  Her CT scan shows a lateral tibial plateau fracture.  There is less than 5 mm of articular depression and less than 5 mm of condylar widening, so I recommend continued nonoperative treatment.  She can be range of motion as tolerated at the knee, but should be toe-touch weightbearing on that side.  She has follow-up already scheduled with me so I will plan the next see her at that date.  London Sheer, MD Orthopedic Surgeon

## 2022-11-02 ENCOUNTER — Telehealth: Payer: Self-pay | Admitting: Orthopedic Surgery

## 2022-11-02 ENCOUNTER — Encounter: Payer: Self-pay | Admitting: Orthopedic Surgery

## 2022-11-02 MED ORDER — OXYCODONE HCL 5 MG PO TABS: 5.0000 mg | ORAL_TABLET | ORAL | 0 refills | Status: AC | PRN

## 2022-11-02 NOTE — Telephone Encounter (Signed)
Orthopedic Telephone Call  Spoke to patient this afternoon. Told her to take 81mg  ASA BID to prevent blood clots. TTWB LLE. She is okay for ROM in the left knee. She feels she has pain elsewhere that is severe and was needs XRs since it feels like she broke other bones. I told her to come in the office at any time tomorrow afternoon and we will XR the other areas that hurt. I prescribed her more oxycodone to help with the pain.   London Sheer, MD Orthopedic Surgeon

## 2022-11-03 NOTE — Telephone Encounter (Signed)
I advised Sheri [name] to put on the schedule when she get here

## 2022-11-05 ENCOUNTER — Other Ambulatory Visit (INDEPENDENT_AMBULATORY_CARE_PROVIDER_SITE_OTHER): Payer: 59

## 2022-11-05 ENCOUNTER — Ambulatory Visit (INDEPENDENT_AMBULATORY_CARE_PROVIDER_SITE_OTHER): Payer: 59 | Admitting: Orthopedic Surgery

## 2022-11-05 DIAGNOSIS — M25562 Pain in left knee: Secondary | ICD-10-CM

## 2022-11-05 DIAGNOSIS — S82142A Displaced bicondylar fracture of left tibia, initial encounter for closed fracture: Secondary | ICD-10-CM

## 2022-11-05 DIAGNOSIS — M79605 Pain in left leg: Secondary | ICD-10-CM

## 2022-11-05 DIAGNOSIS — M25561 Pain in right knee: Secondary | ICD-10-CM

## 2022-11-05 NOTE — Progress Notes (Signed)
Orthopedic Surgery Office Note   Assessment: Patient is a 72 y.o. female with left knee pain after a fall. Has a left lateral tibial plateau fracture Date of injury: 10/19/2022 (~2.5 weeks from injury)     Plan: -Pain control: Continue oxycodone -Toe-touch weightbearing left lower extremity.  Walker prescribed today to help with that -Knee immobilizer when out of bed -Reiterated to patient that should start 81 mg aspirin twice daily for DVT prophylaxis -Emphasized that she needs to be toe-touch weightbearing and cannot put weight on her left lower extremity.  Weight bearing may cause her fracture to displace further. She expressed understanding of that restriction and the rationale -Return to the office in 2 weeks, x-rays at next visit: Left knee AP/lateral     Patient expressed understanding of the plan and all questions were answered to the patient's satisfaction.    ___________________________________________________________________________     History:   Patient is a 72 y.o. female who comes in today for repeat evaluation.  She is still having significant pain in the left knee and is using oxycodone to control the pain.  She also reports left thigh, left ankle, right knee, right ankle pain.  She was frustrated because those x-rays were not obtained in the office at the last visit.  She is having pain on a daily basis.  Pain is severe in nature.  She has been weightbearing on the left lower extremity with a knee immobilizer, even after our phone call on 11/02/2022.  Has been using a cane to ambulate.  Denies paresthesia numbness.  Of note, patient has been trying to get the video from the birthday party but the owner is restricting that.  She is thinking about getting a lawyer to help her get a copy of the video   Treatments tried: Norco, knee immobilizer, oxycodone     Physical Exam:   General: no acute distress, appears stated age Neurologic: alert, answering questions  appropriately, following commands Respiratory: unlabored breathing on room air, symmetric chest rise Psychiatric: appropriate affect, normal cadence to speech     MSK (LLE): -Tender to palpation over the left knee especially laterally and over the lateral aspect of the left tibia, no other tenderness palpation, no open wounds, EHL/TA/GSC intact, sensation intact to light touch in sural/saphenous/deep peroneal/superficial peroneal/tibial nerve distributions, foot warm and well-perfused   Imaging: XR of the left knee were taken 11/05/2022 and independently interpreted and reviewed, showing lucency along the lateral aspect of the lateral joint surface. Minimal joint depression. Condylar widening less than 5mm.  No other fracture seen.  XR of the left ankle, right knee, left knee, left femur were independently reviewed and interpreted on 11/05/2022, showing no fracture or dislocation.  CT of the left knee from 10/31/2022 was independently reviewed and interpreted, showing lateral tibial plateau fracture with approximately 2.5 mm of joint depression.  Condylar widening less than 5 mm.     Patient name: Sheri [name] Patient MRN: [id number] Date of visit: 11/05/22

## 2022-11-12 ENCOUNTER — Telehealth: Payer: Self-pay | Admitting: Orthopedic Surgery

## 2022-11-12 MED ORDER — OXYCODONE HCL 5 MG PO TABS: 5.0000 mg | ORAL_TABLET | ORAL | 0 refills | Status: AC | PRN

## 2022-11-12 NOTE — Addendum Note (Signed)
Addended by: Willia Craze on: 11/12/2022 02:31 PM   Modules accepted: Orders

## 2022-11-12 NOTE — Telephone Encounter (Signed)
Patient asking for a refill on her Oxycodone . Please asdvise

## 2022-11-13 ENCOUNTER — Telehealth: Payer: Self-pay | Admitting: Orthopedic Surgery

## 2022-11-13 NOTE — Telephone Encounter (Signed)
Patient advising that her medication is not ready pharmacy advising waiting on Dr. Christell Constant to call for the Rx to be filled..[phone]

## 2022-11-13 NOTE — Telephone Encounter (Signed)
I called and spoke with the pharmacy and they are working on getting it ready for her.

## 2022-11-14 ENCOUNTER — Ambulatory Visit: Payer: 59 | Admitting: Cardiology

## 2022-11-19 ENCOUNTER — Ambulatory Visit (INDEPENDENT_AMBULATORY_CARE_PROVIDER_SITE_OTHER): Payer: 59 | Admitting: Orthopedic Surgery

## 2022-11-19 ENCOUNTER — Other Ambulatory Visit (INDEPENDENT_AMBULATORY_CARE_PROVIDER_SITE_OTHER): Payer: 59

## 2022-11-19 DIAGNOSIS — S82142A Displaced bicondylar fracture of left tibia, initial encounter for closed fracture: Secondary | ICD-10-CM

## 2022-11-19 NOTE — Progress Notes (Signed)
Orthopedic Surgery Office Note   Assessment: Patient is a 72 y.o. female with left knee pain after a fall. Has a left lateral tibial plateau fracture Date of injury: 10/19/2022 (~4 weeks from injury)     Plan: -Pain control: still has oxycodone, will prescribe her more if she runs out -Toe-touch weightbearing left lower extremity -Knee immobilizer when out of bed -Will have her start more range of motion after our next visit -Continue 81 mg aspirin twice daily for DVT prophylaxis -Return to the office in 2 weeks, x-rays at next visit: Left knee AP/lateral     Patient expressed understanding of the plan and all questions were answered to the patient's satisfaction.    ___________________________________________________________________________     History:   Patient is a 72 y.o. female who comes in today for repeat evaluation.  Pain has improved significant since the last time I saw her.  She says she is able to do more now that her pain is more tolerable.  She has been ambulating with a cane and a knee immobilizer on.  Her pain is now localized mostly to the knee.  She does not have any pain radiating down the left leg.  Denies paresthesias and numbness.     Treatments tried: Norco, knee immobilizer, oxycodone     Physical Exam:   General: no acute distress, appears stated age, walking the halls with cane and knee immobilizer on Neurologic: alert, answering questions appropriately, following commands Respiratory: unlabored breathing on room air, symmetric chest rise Psychiatric: appropriate affect, normal cadence to speech     MSK (LLE): -Tender to palpation over the left knee especially the lateral aspect, no other tenderness palpation, EHL/TA/GSC intact, sensation intact to light touch in sural/saphenous/deep peroneal/superficial peroneal/tibial nerve distributions, foot warm and well-perfused, ROM from 0-70 at the knee   Imaging: XR of the left knee taken 11/19/2022 was  independently interpreted and reviewed, showing a lateral tibial plateau fracture with minimal joint depression that appears unchanged from films 2 weeks ago. Condylar widening less than 5mm.  No other fractures seen.   CT of the left knee from 10/31/2022 was previously independently reviewed and interpreted, showing lateral tibial plateau fracture with approximately 2.5 mm of joint depression.  Condylar widening less than 5 mm.     Patient name: Sheri [name] Patient MRN: [id number] Date of visit: 11/19/22

## 2022-11-25 ENCOUNTER — Ambulatory Visit (INDEPENDENT_AMBULATORY_CARE_PROVIDER_SITE_OTHER): Payer: 59 | Admitting: Internal Medicine

## 2022-11-25 ENCOUNTER — Telehealth: Payer: Self-pay | Admitting: Orthopedic Surgery

## 2022-11-25 ENCOUNTER — Encounter: Payer: Self-pay | Admitting: Internal Medicine

## 2022-11-25 ENCOUNTER — Other Ambulatory Visit: Payer: Self-pay

## 2022-11-25 VITALS — BP 110/58 | HR 81 | Temp 98.4°F | Ht 65.5 in | Wt 162.0 lb

## 2022-11-25 DIAGNOSIS — M19042 Primary osteoarthritis, left hand: Secondary | ICD-10-CM | POA: Diagnosis not present

## 2022-11-25 DIAGNOSIS — M810 Age-related osteoporosis without current pathological fracture: Secondary | ICD-10-CM

## 2022-11-25 DIAGNOSIS — M19041 Primary osteoarthritis, right hand: Secondary | ICD-10-CM | POA: Diagnosis not present

## 2022-11-25 DIAGNOSIS — Z23 Encounter for immunization: Secondary | ICD-10-CM

## 2022-11-25 DIAGNOSIS — H8109 Meniere's disease, unspecified ear: Secondary | ICD-10-CM | POA: Diagnosis not present

## 2022-11-25 DIAGNOSIS — R682 Dry mouth, unspecified: Secondary | ICD-10-CM | POA: Diagnosis not present

## 2022-11-25 DIAGNOSIS — Z0000 Encounter for general adult medical examination without abnormal findings: Secondary | ICD-10-CM

## 2022-11-25 DIAGNOSIS — I471 Supraventricular tachycardia, unspecified: Secondary | ICD-10-CM

## 2022-11-25 DIAGNOSIS — F3341 Major depressive disorder, recurrent, in partial remission: Secondary | ICD-10-CM

## 2022-11-25 DIAGNOSIS — L989 Disorder of the skin and subcutaneous tissue, unspecified: Secondary | ICD-10-CM | POA: Diagnosis not present

## 2022-11-25 DIAGNOSIS — J984 Other disorders of lung: Secondary | ICD-10-CM

## 2022-11-25 DIAGNOSIS — E7800 Pure hypercholesterolemia, unspecified: Secondary | ICD-10-CM

## 2022-11-25 MED ORDER — PRAVASTATIN SODIUM 20 MG PO TABS: 20.0000 mg | ORAL_TABLET | Freq: Every day | ORAL | 3 refills | Status: DC

## 2022-11-25 MED ORDER — OXYCODONE HCL 5 MG PO TABS: 5.0000 mg | ORAL_TABLET | ORAL | 0 refills | Status: AC | PRN

## 2022-11-25 NOTE — Telephone Encounter (Signed)
Pt called requesting refill of oxycodone. Please send to pharmacy on file. Please call pt at [phone]

## 2022-11-25 NOTE — Patient Instructions (Signed)
Sheri [name] - -  Thank you for coming in today  For your cholesterol, please start Pravastatin 20mg  daily.   For your wheezing - we will check a CT of your chest and pulmonary function tests.  Please schedule these exams when you are able.   For your dry mouth, dry eyes, thick saliva - - we will start with checking for a disease called Sjogren's syndrome.    Please come back to see me in 2 months after this testing is complete.   Thank you!

## 2022-11-25 NOTE — Progress Notes (Unsigned)
Established Patient Office Visit  Subjective   Patient ID: Sheri [name], female    DOB: 03-Aug-1950  Age: 72 y.o. MRN: [id number]  Chief Complaint  Patient presents with   3 Month F/U Visit    Wheezing in her chest. Thick saliva. Left knee fx.    Sheri [name] is a 72 year old woman with PMH of Meniere's (tinnitus, vertigo), osteoporosis, MDD, OA, chronic cough who presents for follow up.   At last visit, Sheri [name] was found to have an LDL of 138.  Her ASCVD risk score was 7.8 which places her above the 7.5% recommendation to start a statin.  We discussed this today and she is willing to start a moderate intensity statin.   Since last being seen, she had a fall and fractured her knee.  She is in a knee immobilizer and on aspirin for DVT PPx and oxycodone for pain.  Today she reports continued pain.  I reminded her to get the aspirin as per her orthopedist instructions.   She reports 2 issues, which come and go and have been issues for years.  Intermittent wheezing, using albuterol up to 4 X per day on bad weeks.  She notes no history of asthma, no change with weather or seasons.  She feels like the wheezing is down low in her lungs.  She does not have SOB.  She was noted to have a CXR when she broke her knee which showed parenchymal scarring, with at least one new area.  She has never had PFTs.    She also reports "thick saliva" which occurs intermittently, is associated with dry eye and dry mouth.  She wakes up with very thick saliva on her tongue which she has to scrape out.  She will sometimes feel like the saliva is coming from her upper mouth and or sinuses.  She has used flonase in the past and this has helped a little.  She notes occasional gland swelling under her jaw.   She notes not having time to get hand xrays as requested at last visit due to the knee fracture.     Patient Active Problem List   Diagnosis Date Noted   Osteoarthritis of hands, bilateral 08/13/2022   BPV (benign  positional vertigo) 04/30/2022   Eyelid lesion 04/02/2022   Vaginal atrophy 01/17/2022   Varicose veins of both lower extremities with pain 01/17/2022   Atherosclerosis of aorta (HCC) 01/17/2022   Skin lesion of back 06/28/2021   Internal and external hemorrhoids without complication 08/10/2020   Recurrent major depressive disorder, in partial remission (HCC) 08/10/2020   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   Tinnitus of both ears 09/30/2018   Osteoporosis 01/26/2018   Postmenopausal 12/31/2016   Poor dentition 12/31/2016   Mnire's disease 03/07/2016   Chiari I malformation (HCC) 03/07/2016   Weight gain 09/21/2015   Healthcare maintenance 04/14/2013   SVT (supraventricular tachycardia) 03/15/2013    Review of Systems  Constitutional:  Positive for malaise/fatigue. Negative for chills, diaphoresis, fever and weight loss.  HENT:  Positive for hearing loss and tinnitus. Negative for congestion.   Respiratory:  Positive for cough and wheezing. Negative for sputum production and shortness of breath.   Cardiovascular:  Negative for chest pain.  Musculoskeletal:  Positive for falls and joint pain. Negative for myalgias.  Neurological:  Positive for dizziness. Negative for focal weakness.      Objective:     BP (!) 110/58 (BP Location: Left Arm, Patient Position: Sitting,  Cuff Size: Normal)   Pulse 81   Temp 98.4 F (36.9 C) (Oral)   Ht 5' 5.5" (1.664 m)   Wt 162 lb (73.5 kg)   SpO2 97% Comment: RA  BMI 26.55 kg/m  BP Readings from Last 3 Encounters:  11/25/22 (!) 110/58  10/19/22 108/69  08/11/22 107/66   Wt Readings from Last 3 Encounters:  11/25/22 162 lb (73.5 kg)  10/19/22 160 lb (72.6 kg)  08/11/22 165 lb 11.2 oz (75.2 kg)      Physical Exam Vitals and nursing note reviewed.  Constitutional:      General: She is not in acute distress.    Appearance: Normal appearance. She is not toxic-appearing.  HENT:     Head: Normocephalic and atraumatic.      Mouth/Throat:     Mouth: Mucous membranes are dry.     Pharynx: No oropharyngeal exudate.     Comments: She has whitish areas on the tongue of thick saliva, not true plaques.  She has mildly dry MM. Neck is supple without lymphadenopathy or palpable salivary glands.  Cardiovascular:     Rate and Rhythm: Normal rate and regular rhythm.     Heart sounds: No murmur heard. Pulmonary:     Effort: Pulmonary effort is normal. No respiratory distress.     Breath sounds: Normal breath sounds. No wheezing, rhonchi or rales.  Abdominal:     General: Abdomen is flat.     Palpations: Abdomen is soft.  Musculoskeletal:     Comments: She has knee immobilizer on the left leg.   Skin:    General: Skin is warm and dry.  Neurological:     Mental Status: She is alert and oriented to person, place, and time. Mental status is at baseline.  Psychiatric:        Mood and Affect: Mood normal.        Behavior: Behavior normal.      The 10-year ASCVD risk score (Arnett DK, et al., 2019) is: 9%    Assessment & Plan:   Problem List Items Addressed This Visit       Unprioritized   Healthcare maintenance   Relevant Orders   Pneumococcal conjugate vaccine 20-valent (Prevnar 20)   Other Visit Diagnoses     Apical lung scarring    -  Primary   Relevant Orders   CT Chest Wo Contrast   Pulmonary function test   Dry mouth       Relevant Orders   ANA, IFA (with reflex)   Sjogrens syndrome-A extractable nuclear antibody   Sjogrens syndrome-B extractable nuclear antibody   Pure hypercholesterolemia       Relevant Medications   pravastatin (PRAVACHOL) 20 MG tablet       Return in about 2 months (around 01/25/2023).    Debe Coder, MD

## 2022-11-26 DIAGNOSIS — R682 Dry mouth, unspecified: Secondary | ICD-10-CM | POA: Insufficient documentation

## 2022-11-26 DIAGNOSIS — E7800 Pure hypercholesterolemia, unspecified: Secondary | ICD-10-CM | POA: Insufficient documentation

## 2022-11-26 DIAGNOSIS — J984 Other disorders of lung: Secondary | ICD-10-CM | POA: Insufficient documentation

## 2022-11-26 NOTE — Assessment & Plan Note (Signed)
Noted increased PHQ-9.  She follows with psychiatry and is on triple therapy.  Worsened mood reported to be due to knee injury  Plan Continue medication and follow up with psychiatry.

## 2022-11-26 NOTE — Assessment & Plan Note (Signed)
Unfortunately had a knee fracture since last visit.  She is in a knee immobilizer today.  She notes continued pain and she has medication from her orthopedist.   Plan Continue alendronate Continue follow up with orthopedics.

## 2022-11-26 NOTE — Assessment & Plan Note (Signed)
-   PNA vaccine today 

## 2022-11-26 NOTE — Assessment & Plan Note (Signed)
She was not able to get hand xrays after last visit.   Plan Hand and wrist xrays bilaterally.

## 2022-11-26 NOTE — Assessment & Plan Note (Signed)
Reviewed with her today, no concerns at this time.

## 2022-11-26 NOTE — Assessment & Plan Note (Signed)
Check ANA, Ro, La as initial testing.   Further testing based on these results.

## 2022-11-26 NOTE — Assessment & Plan Note (Signed)
She is on chronic propranolol and diltiazem.  She follows with Cardiology.  Her pulse today is 81, which is well controlled.   Plan Continue current therapy and follow up with Cardiology

## 2022-11-26 NOTE — Assessment & Plan Note (Signed)
LDL at last check was 138.  ASCVD risk > 7.5%.  We discussed starting a statin.  Will start with low intensity pravastatin and discuss increasing at next visit.   Plan Start pravastatin 20mg  daily

## 2022-11-26 NOTE — Assessment & Plan Note (Signed)
She has continued issues which are improved with meclizine.   Continue meclizine for now.

## 2022-11-26 NOTE — Assessment & Plan Note (Signed)
Seen on chest xray.  She has a history of off and on wheezing.  She has not had a recent chest CT.  Lung exam is benign today, no wheezing.   Plan Chest CT PFTs Continue albuterol Consider nebulizer in future.

## 2022-11-28 ENCOUNTER — Encounter: Payer: Self-pay | Admitting: Internal Medicine

## 2022-11-28 LAB — SJOGRENS SYNDROME-A EXTRACTABLE NUCLEAR ANTIBODY: ENA SSA (RO) Ab: <0.2 AI (ref 0.0–0.9)

## 2022-11-28 LAB — SJOGRENS SYNDROME-B EXTRACTABLE NUCLEAR ANTIBODY: ENA SSB (LA) Ab: <0.2 AI (ref 0.0–0.9)

## 2022-11-28 LAB — ANTINUCLEAR ANTIBODIES, IFA: ANA Titer 1: NEGATIVE

## 2022-12-01 ENCOUNTER — Other Ambulatory Visit: Payer: Self-pay | Admitting: Cardiology

## 2022-12-01 ENCOUNTER — Inpatient Hospital Stay (HOSPITAL_COMMUNITY): Admission: RE | Admit: 2022-12-01 | Payer: 59 | Source: Ambulatory Visit

## 2022-12-03 ENCOUNTER — Ambulatory Visit (INDEPENDENT_AMBULATORY_CARE_PROVIDER_SITE_OTHER): Payer: 59 | Admitting: Orthopedic Surgery

## 2022-12-03 ENCOUNTER — Other Ambulatory Visit (INDEPENDENT_AMBULATORY_CARE_PROVIDER_SITE_OTHER): Payer: 59

## 2022-12-03 DIAGNOSIS — S82142A Displaced bicondylar fracture of left tibia, initial encounter for closed fracture: Secondary | ICD-10-CM | POA: Diagnosis not present

## 2022-12-03 NOTE — Progress Notes (Signed)
Orthopedic Surgery Office Note   Assessment: Patient is a 72 y.o. female with left knee pain after a fall. Has a left lateral tibial plateau fracture Date of injury: 10/19/2022 (~6 weeks from injury)     Plan: -Pain control: OTC medications (told her she can alternate tylenol and an NSAID).  Voltaren gel over the affected area -Toe-touch weightbearing left lower extremity -Encouraged her to work on range of motion when sitting or laying down -Knee immobilizer when out of the house -Will start PT after our next visit -Continue 81 mg aspirin twice daily for DVT prophylaxis -Return to the office in 6 weeks, x-rays at next visit: Left knee AP/lateral     Patient expressed understanding of the plan and all questions were answered to the patient's satisfaction.    ___________________________________________________________________________     History:   Patient is a 72 y.o. female who comes in today for repeat evaluation.  Pain has been gradually getting better.  She says it will get essentially 2 steps better for 1 step back.  She is taking Tylenol for pain relief.  She discontinued the knee immobilizer on her own.  She has been ambulating with a cane.  Not having any pain rating down the leg.  She does have some pain over the lateral aspect of the leg near the IT band.  Denies paresthesias and numbness.     Treatments tried: Norco, knee immobilizer, oxycodone     Physical Exam:   General: no acute distress, appears stated age, walking the halls with cane and knee immobilizer on Neurologic: alert, answering questions appropriately, following commands Respiratory: unlabored breathing on room air, symmetric chest rise Psychiatric: appropriate affect, normal cadence to speech     MSK (LLE): -Minimally tender to palpation over the lateral aspect of the knee in the area of the IT band, no other tenderness palpation, EHL/TA/GSC intact, sensation intact to light touch in sural/saphenous/deep  peroneal/superficial peroneal/tibial nerve distributions, foot warm and well-perfused, ROM from 0-90 at the knee   Imaging: XR of the left knee taken 12/03/2022 was independently interpreted and reviewed, showing a lateral tibial plateau fracture with minimal joint depression that appears unchanged from films on 11/19/2022. Condylar widening less than 5mm.  No other fractures seen.   CT of the left knee from 10/31/2022 was previously independently reviewed and interpreted, showing lateral tibial plateau fracture with approximately 2.5 mm of joint depression.  Condylar widening less than 5 mm.     Patient name: Sheri [name] Patient MRN: [id number] Date of visit: 12/03/22

## 2022-12-08 ENCOUNTER — Telehealth: Payer: Self-pay | Admitting: Orthopedic Surgery

## 2022-12-08 MED ORDER — OXYCODONE HCL 5 MG PO TABS: 2.5000 mg | ORAL_TABLET | ORAL | 0 refills | Status: AC | PRN

## 2022-12-08 NOTE — Telephone Encounter (Signed)
Orthopedic Telephone Call  Patient still having significant pain. She has been trying tylenol and NSAIDs but they are not working. She does have a fracture so will continue with oxycodone, but will try to wean her.  London Sheer, MD Orthopedic Surgeon

## 2022-12-08 NOTE — Telephone Encounter (Signed)
Pt called stating Dr Christell Constant stop her pain medication and over the counter meds are not working. Also pt need a new wrap/brace for left knee/leg. Please call pt about this matter at [phone].

## 2022-12-08 NOTE — Telephone Encounter (Signed)
I called , patient, I advised her that her insurance may not cover a new brace, however I am more than to bring her in tomorrow to be fitted for new brace. She states that she was not fitted for her brace at the Urgent Care, I advised her that I do not know how they do things there in that office but we want you to leave and it fit correctly and I am not sure what equipment that they use there. She states that she has fractures all over her leg and I advised that the only fracture she has is the one in her knee. I did put her on speaker phone for Dr. Christell Constant to listen in on and he did advise me that he would send her in 1 more refill on her pains, I advised her that she was almost  months out from her injury and that the pain should be improving at this point. I also advised that she check with her pharmacy later this evening about her pain meds, that I would talk with Dr. Christell Constant and that he was in seeing patients and that it may be after clinic before he gets to the message. She states that she under stands

## 2022-12-31 ENCOUNTER — Ambulatory Visit (HOSPITAL_COMMUNITY)
Admission: RE | Admit: 2022-12-31 | Discharge: 2022-12-31 | Disposition: A | Discharge status: Home / Self Care | Payer: 59 | Source: Ambulatory Visit | Attending: Internal Medicine | Admitting: Internal Medicine

## 2022-12-31 DIAGNOSIS — J984 Other disorders of lung: Secondary | ICD-10-CM | POA: Diagnosis not present

## 2022-12-31 DIAGNOSIS — J432 Centrilobular emphysema: Secondary | ICD-10-CM | POA: Diagnosis not present

## 2022-12-31 DIAGNOSIS — R062 Wheezing: Secondary | ICD-10-CM | POA: Diagnosis not present

## 2023-01-01 ENCOUNTER — Telehealth: Payer: Self-pay | Admitting: *Deleted

## 2023-01-01 NOTE — Telephone Encounter (Signed)
RTC to patient message left that we have an opening for this afternoon. Patient was again called message left to call the Clinics as soon as possible.

## 2023-01-01 NOTE — Telephone Encounter (Signed)
Thank you for speaking with her.  I don't see that she called last week!  I hope she will be able to get in for this afternoon.

## 2023-01-01 NOTE — Telephone Encounter (Addendum)
RTC to patient states has a cough.  Has been coughing fore 3 weeks. Has  congestion as well.  Has been using her Inhaler 5-6 times a day and during the night.  Patient states is coughing up yellow mucous sometimes.  No fevers.  Inhaler is not working.  Has some wheezing.  None noted during conversation with patient or shortness of breath.  Upset that patient states that she had called at 11:30 this morning and I was calling at 2 PM. Informed patient that I had just gotten the message and was returning her call.  Patient was again informed as she was this morning that there were no appointments available this afternoon or tomorrow.  Patient was told  that with her symptoms she would need to go to the ER or Urgent Care. Stated that we were her Primary doctor and did not see the need to have to go to the ER.  Patient also stated that Dr. Criselda Peaches has not returned her call from last week and feels that the doctor does not care about her. Patient again informed that we did not have any openings ans could not be squeezed in as she suggested. Patient was again informed that if we have an opening she will be called. Patient also stated that she needs at least 20-30 minutes to get here if called.

## 2023-01-14 ENCOUNTER — Encounter: Payer: Self-pay | Admitting: Internal Medicine

## 2023-01-14 ENCOUNTER — Other Ambulatory Visit: Payer: Self-pay | Admitting: Internal Medicine

## 2023-01-14 ENCOUNTER — Ambulatory Visit: Payer: 59 | Admitting: Cardiology

## 2023-01-14 MED ORDER — INCRUSE ELLIPTA 62.5 MCG/ACT IN AEPB: 1.0000 | INHALATION_SPRAY | Freq: Every day | RESPIRATORY_TRACT | 11 refills | Status: AC

## 2023-01-15 ENCOUNTER — Telehealth: Payer: Self-pay | Admitting: *Deleted

## 2023-01-15 NOTE — Telephone Encounter (Signed)
Call from pt - very upset that her husband and she unable to get an appt with Dr Criselda Peaches.Stated she has been trying for months - being told to call back or Dr Donnetta Hutching schedule is not available. Stated she talked to Dr Criselda Peaches yesterday and was told to call today to schedule the appts. I talked to [facility], Research officer, political party, who will call pt back. This was told to the pt - is wants to know why she has to talk to 4 people ( Dr Criselda Peaches, front office , triage, practice adm) just to make an appt.

## 2023-01-16 NOTE — Telephone Encounter (Signed)
Pt has an appt w/Dr Criselda Peaches on 02/06/23.

## 2023-01-16 NOTE — Telephone Encounter (Signed)
FYI.  She can be very rude to front desk and triage.  Just for her file.

## 2023-01-19 ENCOUNTER — Ambulatory Visit: Payer: 59 | Admitting: Orthopedic Surgery

## 2023-01-20 ENCOUNTER — Telehealth: Payer: Self-pay | Admitting: Internal Medicine

## 2023-01-20 NOTE — Telephone Encounter (Signed)
Pt requesting a call back from Dr. Criselda Peaches about her inhaler medications.

## 2023-01-30 NOTE — Telephone Encounter (Signed)
Unable to reach patient.  Appointment coming up where we will discuss.

## 2023-02-02 ENCOUNTER — Telehealth: Payer: Self-pay | Admitting: Orthopedic Surgery

## 2023-02-02 NOTE — Telephone Encounter (Signed)
Patient called advised her left knee is swollen and she's having a lot of pain in her leg. Patient asked if she can be worked into one of the Providers schedule tomorrow.   The number to contact patient is [phone]

## 2023-02-03 NOTE — Telephone Encounter (Signed)
Appt scheduled

## 2023-02-04 ENCOUNTER — Ambulatory Visit (INDEPENDENT_AMBULATORY_CARE_PROVIDER_SITE_OTHER): Payer: 59 | Admitting: Surgical

## 2023-02-04 ENCOUNTER — Other Ambulatory Visit (INDEPENDENT_AMBULATORY_CARE_PROVIDER_SITE_OTHER): Payer: 59

## 2023-02-04 DIAGNOSIS — S82142A Displaced bicondylar fracture of left tibia, initial encounter for closed fracture: Secondary | ICD-10-CM

## 2023-02-06 ENCOUNTER — Ambulatory Visit: Payer: 59

## 2023-02-06 ENCOUNTER — Encounter: Payer: Self-pay | Admitting: Internal Medicine

## 2023-02-06 ENCOUNTER — Ambulatory Visit (INDEPENDENT_AMBULATORY_CARE_PROVIDER_SITE_OTHER): Payer: 59 | Admitting: Internal Medicine

## 2023-02-06 ENCOUNTER — Other Ambulatory Visit: Payer: Self-pay

## 2023-02-06 VITALS — BP 104/62 | HR 78 | Temp 98.0°F | Ht 65.5 in | Wt 163.0 lb

## 2023-02-06 DIAGNOSIS — R635 Abnormal weight gain: Secondary | ICD-10-CM

## 2023-02-06 DIAGNOSIS — R062 Wheezing: Secondary | ICD-10-CM

## 2023-02-06 DIAGNOSIS — J984 Other disorders of lung: Secondary | ICD-10-CM | POA: Diagnosis not present

## 2023-02-06 DIAGNOSIS — F4321 Adjustment disorder with depressed mood: Secondary | ICD-10-CM | POA: Diagnosis not present

## 2023-02-06 NOTE — Progress Notes (Signed)
Established Patient Office Visit  Subjective   Patient ID: ZAYNE MAROVICH, female    DOB: 11-12-1950  Age: 72 y.o. MRN: [id number]  Chief Complaint  Patient presents with   Review x-ray result.   Discuss medications    Ms. Bridgett is a 72 year old woman with PMH of Meniere's disease, osteoporosis, MDD (followed by psychiatry), HLD who presents for follow up of imaging of her lungs.  She was noted to have apical scarring on a chest xray and we went ahead and got a CT scan of the lungs to follow up on this.  She notes progressive episodes of coughing and wheezing.  She used to have these very intermittently, but it has gotten worse lately.  She has no history of smoking, but does have a history of ambient smoke inhalation.  She was noted to have signs of emphysema on the imaging.  I called and spoke to her about these findings and recommended starting Incruse to help with her wheezing and cough.  This has helped her a lot and she hasn't had as much coughing since starting this.  She would like to see a pulmonologist to see if there is anything else she should be doing.  I think this is reasonable and recommended PFTs as well.    She notes that her sister died about 2 weeks ago.  She is having a normal grief reaction to this.  Her sister had lung disease and died of pneumonia.  We discussed grief counseling and I will give her resources for this.   She also noted dry eye.  She has been doing what her eye doctor has told her to do, however, she does not like having to do this regularly.  We discussed prescription options for her to discuss with her eye doctor.     Review of Systems  Constitutional:  Negative for chills, fever and weight loss.  Eyes:        Dry eye  Cardiovascular:  Negative for chest pain.  Gastrointestinal:  Negative for heartburn.  Musculoskeletal:  Positive for joint pain.  Skin:  Negative for itching and rash.  Neurological:  Positive for dizziness (chronic).   Psychiatric/Behavioral:  Negative for depression. The patient is not nervous/anxious.       Objective:     BP 104/62 (BP Location: Right Arm, Patient Position: Sitting, Cuff Size: Normal)   Pulse 78   Temp 98 F (36.7 C) (Oral)   Ht 5' 5.5" (1.664 m)   Wt 163 lb (73.9 kg)   SpO2 98% Comment: RA  BMI 26.71 kg/m  BP Readings from Last 3 Encounters:  02/06/23 104/62  11/25/22 (!) 110/58  10/19/22 108/69   Wt Readings from Last 3 Encounters:  02/06/23 163 lb (73.9 kg)  11/25/22 162 lb (73.5 kg)  10/19/22 160 lb (72.6 kg)      Physical Exam Vitals and nursing note reviewed.  Constitutional:      General: She is not in acute distress.    Appearance: Normal appearance. She is not toxic-appearing.  HENT:     Head: Normocephalic and atraumatic.  Eyes:     General:        Right eye: No discharge.        Left eye: No discharge.  Cardiovascular:     Rate and Rhythm: Normal rate.  Pulmonary:     Effort: Pulmonary effort is normal. No respiratory distress.  Musculoskeletal:     Comments: She is wearing a  left sided knee brace  Skin:    General: Skin is warm and dry.  Neurological:     Mental Status: She is alert.  Psychiatric:        Mood and Affect: Mood normal.        Behavior: Behavior normal.      No results found for any visits on 02/06/23.   The 10-year ASCVD risk score (Arnett DK, et al., 2019) is: 8.1%    Assessment & Plan:   Problem List Items Addressed This Visit       Unprioritized   Weight gain    Suggested she speak with her surgeon about best exercises with her knee brace.      Apical lung scarring - Primary    Continue incruse ellipta.  Refer to pulmonology.  Recommend PFTs. These have been ordered, but not scheduled yet.       Relevant Orders   Ambulatory referral to Pulmonology   Grief reaction    Information for grief counseling with [facility] provided.       Other Visit Diagnoses     Wheezing       Relevant  Orders   Ambulatory referral to Pulmonology      Dry eye: advised she speak with her eye doctor about this issue.    Return in about 3 months (around 05/09/2023).    Debe Coder, MD

## 2023-02-06 NOTE — Assessment & Plan Note (Signed)
Continue incruse ellipta.  Refer to pulmonology.  Recommend PFTs. These have been ordered, but not scheduled yet.

## 2023-02-06 NOTE — Patient Instructions (Addendum)
Ms. Fatzinger - -  It as lovely to see you today!  I am very sorry to hear about your sisters passing.  Please contact the Grief Counseling services at [facility] [place] if you think this would help you as you process your loss.   You can contact them online here:  [email or web address]  Or by phone here: [phone]    For your dry eye: Please talk to your eye doctor about possibly trying Xidra or Restasis.  These medications are prescription for dry eye.    Given the findings on your scan, and that you improved on Incruse inhaler, I will refer you to Pulmonology for another opinion.  Thank you!  Please speak with your surgeon about what exercises may help with weight loss.

## 2023-02-06 NOTE — Assessment & Plan Note (Signed)
Information for grief counseling with [facility] provided.

## 2023-02-06 NOTE — Assessment & Plan Note (Signed)
Suggested she speak with her surgeon about best exercises with her knee brace.

## 2023-02-07 ENCOUNTER — Encounter: Payer: Self-pay | Admitting: Surgical

## 2023-02-07 NOTE — Progress Notes (Signed)
Office Visit Note   Patient: Sheri [name]           Date of Birth: June 20, 1951           MRN: [id number] Visit Date: 02/04/2023 Requested by: Inez Catalina, MD [address] [place],  [place] [address] PCP: Inez Catalina, MD  Subjective: Chief Complaint  Patient presents with   Left Knee - Pain    HPI: Sheri [name] is a 72 y.o. female who presents to the office reporting left knee pain.  Patient has a history of left knee tibial plateau fracture that has been treated nonoperatively by Dr. Christell Constant.  She sustained this injury several months ago earlier this year and has been able to return weight bearing fully but then 2 to 3 weeks ago she slipped in her shower/tub and reinjured her left knee.  She did not feel a pop at the time of the injury.  She describes continued lateral pain in the knee but now she has new medial pain with increased swelling in this area.  She has no history of prior knee surgery.  No new groin pain.  Takes Tylenol with some relief.  Has not really had any significant improvement in her pain over the last couple weeks.  She is ambulating with a knee immobilizer.  She denies any bruising after her recent injury..                ROS: All systems reviewed are negative as they relate to the chief complaint within the history of present illness.  Patient denies fevers or chills.  Assessment & Plan: Visit Diagnoses:  1. Closed fracture of left tibial plateau, initial encounter     Plan: Patient is a 72 year old female who presents for evaluation of left knee pain.  Has history of left knee lateral tibial plateau fracture that has been treated nonoperatively by Dr. Christell Constant and been followed by him.  She has new medial sided pain ever since a reinjury where she slipped in her tub about 2 to 3 weeks ago.  Has an effusion on exam today which is new based on previous exam by Dr. Christell Constant on 5/15.  After discussion of options including trying knee injection versus waiting this out versus MRI,  she would like to proceed with MRI of the left knee to further evaluate for medial meniscal tear given the lack of significant degenerative changes in the medial compartment on today's radiographs.  She will follow-up after MRI to review results with Dr. Christell Constant.  Follow-Up Instructions: No follow-ups on file.   Orders:  Orders Placed This Encounter  Procedures   XR KNEE 3 VIEW LEFT   MR Knee Left w/o contrast   No orders of the defined types were placed in this encounter.     Procedures: No procedures performed   Clinical Data: No additional findings.  Objective: Vital Signs: There were no vitals taken for this visit.  Physical Exam:  Constitutional: Patient appears well-developed HEENT:  Head: Normocephalic Eyes:EOM are normal Neck: Normal range of motion Cardiovascular: Normal rate Pulmonary/chest: Effort normal Neurologic: Patient is alert Skin: Skin is warm Psychiatric: Patient has normal mood and affect  Ortho Exam: Ortho exam demonstrates left knee with small effusion.  She has tenderness over the medial joint line and lateral joint line.  There is no bruising or ecchymosis noted.  No swelling aside from the knee effusion.  She has no calf tenderness.  Negative Homans' sign.  Able  to perform straight leg raise.  No instability to varus or valgus stress at 0 and 30 degrees.  Stable to anterior posterior drawer sign.  No pain with hip range of motion.  Negative FADIR sign.  Negative Stinchfield sign.  Specialty Comments:  No specialty comments available.  Imaging: No results found.   PMFS History: Patient Active Problem List   Diagnosis Date Noted   Grief reaction 02/06/2023   Apical lung scarring 11/26/2022   Pure hypercholesterolemia 11/26/2022   Osteoarthritis of hands, bilateral 08/13/2022   BPV (benign positional vertigo) 04/30/2022   Vaginal atrophy 01/17/2022   Varicose veins of both lower extremities with pain 01/17/2022   Atherosclerosis of aorta  (HCC) 01/17/2022   Skin lesion of back 06/28/2021   Internal and external hemorrhoids without complication 08/10/2020   Recurrent major depressive disorder, in partial remission (HCC) 08/10/2020   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   Tinnitus of both ears 09/30/2018   Osteoporosis 01/26/2018   Postmenopausal 12/31/2016   Poor dentition 12/31/2016   Mnire's disease 03/07/2016   Chiari I malformation (HCC) 03/07/2016   Weight gain 09/21/2015   Healthcare maintenance 04/14/2013   SVT (supraventricular tachycardia) 03/15/2013   Past Medical History:  Diagnosis Date   AF (atrial fibrillation) (HCC) 10/12/2015   Allergic rhinitis    Anxiety    ANXIETY DISORDER, GENERALIZED 08/18/2006   Qualifier: Diagnosis of  By: Sherlon Handing MD, Larene Pickett     Arthritis    no per pt   Atrial fibrillation with RVR (HCC)    Cancer (HCC)    left lower eyelid, basal cell cancer   Chiari I malformation (HCC) 03/07/2016   pt unsure of this   Chronic sinusitis    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   COPD (chronic obstructive pulmonary disease) (HCC)    no per pt   Depression    treated at [facility]   GERD (gastroesophageal reflux disease)    Goiter    hx of goiter   Hx of atrial tachycardia since age 55 03/15/2013   Memory loss 12/31/2016   Mnire's disease 03/07/2016   Palpitations    Associated with difficulty breathing   Panic attacks    Poisoning by selective serotonin reuptake inhibitors(969.03) Jan 2007   severe reaction with MAOI (nardil) and dextromethorphan   Postmenopausal 12/31/2016   SVT (supraventricular tachycardia) 03/15/2013   History of SVT s/p afib ablation, 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 Following with cardiology.    Swollen gland 09/14/2018   Tachycardia    Tonsillitis 11/12/2018    Family History  Problem Relation Age of Onset   Hypertension Mother    Hypertension Father    Psoriasis Father    Heart attack Father    Atrial  fibrillation Sister        had a successful a fib ablation   Atrial fibrillation Sister        had a successful a fib ablation   Breast cancer Niece 56   Colon cancer Neg Hx    Esophageal cancer Neg Hx    Stomach cancer Neg Hx    Rectal cancer Neg Hx    Colon polyps Neg Hx     Past Surgical History:  Procedure Laterality Date   APPENDECTOMY     COLONOSCOPY     ELECTROPHYSIOLOGIC STUDY N/A 10/12/2015   Procedure: Atrial Fibrillation Ablation;  Surgeon: Will [name], MD;  Location: [facility];  Service: Cardiovascular;  Laterality: N/A;   LID  LESION EXCISION Left 08/06/2017   Procedure: EXCISION BIOPSY OF EYELID, RECONSTRUCTION OF EYELID;  Surgeon: Floydene Flock, MD;  Location: [facility] OR;  Service: Ophthalmology;  Laterality: Left;   OVARIAN CYST REMOVAL  09/1967   left ovarian cystectomy- 17.5 lb cyst removed- per pt   right mastoidectomy     ET tube placement in 1980 and 1990   right sinus surgery  october 2000   SVT ABLATION N/A 07/25/2020   Procedure: SVT ABLATION;  Surgeon: Regan Lemming, MD;  Location: [facility];  Service: Cardiovascular;  Laterality: N/A;   SVT ABLATION N/A 10/26/2020   Procedure: SVT ABLATION;  Surgeon: Regan Lemming, MD;  Location: [facility];  Service: Cardiovascular;  Laterality: N/A;   THYROIDECTOMY, PARTIAL     nodule removal - April 1996   Social History   Occupational History   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

## 2023-02-09 ENCOUNTER — Other Ambulatory Visit: Payer: Self-pay

## 2023-02-09 NOTE — Telephone Encounter (Signed)
Patient called she is requesting a rx refill for her inhaler. Patient also expressed that during her visit you didn't listen to her lungs and she is concerned on why. Please return patients call.

## 2023-02-10 MED ORDER — ALBUTEROL SULFATE HFA 108 (90 BASE) MCG/ACT IN AERS: 2.0000 | INHALATION_SPRAY | Freq: Four times a day (QID) | RESPIRATORY_TRACT | 2 refills | Status: DC | PRN

## 2023-02-11 ENCOUNTER — Ambulatory Visit
Admission: RE | Admit: 2023-02-11 | Discharge: 2023-02-11 | Disposition: A | Discharge status: Home / Self Care | Payer: 59 | Source: Ambulatory Visit | Attending: Surgical | Admitting: Surgical

## 2023-02-11 DIAGNOSIS — S82142D Displaced bicondylar fracture of left tibia, subsequent encounter for closed fracture with routine healing: Secondary | ICD-10-CM | POA: Diagnosis not present

## 2023-02-11 DIAGNOSIS — M25462 Effusion, left knee: Secondary | ICD-10-CM | POA: Diagnosis not present

## 2023-02-11 DIAGNOSIS — S83272A Complex tear of lateral meniscus, current injury, left knee, initial encounter: Secondary | ICD-10-CM | POA: Diagnosis not present

## 2023-02-11 DIAGNOSIS — S82142A Displaced bicondylar fracture of left tibia, initial encounter for closed fracture: Secondary | ICD-10-CM

## 2023-02-12 ENCOUNTER — Ambulatory Visit (INDEPENDENT_AMBULATORY_CARE_PROVIDER_SITE_OTHER): Payer: 59 | Admitting: Orthopedic Surgery

## 2023-02-12 ENCOUNTER — Other Ambulatory Visit (INDEPENDENT_AMBULATORY_CARE_PROVIDER_SITE_OTHER): Payer: 59

## 2023-02-12 DIAGNOSIS — S82142A Displaced bicondylar fracture of left tibia, initial encounter for closed fracture: Secondary | ICD-10-CM

## 2023-02-12 NOTE — Progress Notes (Signed)
x

## 2023-02-12 NOTE — Progress Notes (Signed)
Orthopedic Surgery Office Note   Assessment: Patient is a 72 y.o. female with left knee pain after a fall. Has a left lateral tibial plateau fracture Date of injury: 10/19/2022 (~4 months from injury)     Plan: -Pain control: can continue with OTC medications and voltaren gel -Will start to PT to work on ROM, strengthening, and decreasing pain. Referral provided to her today -Talked about intraarticular steroid injection but patient not interested at this time. Told her we can do it then if pain gets worse -Return to the office in 3 months, x-rays at next visit: AP/lateral left knee     Patient expressed understanding of the plan and all questions were answered to the patient's satisfaction.    ___________________________________________________________________________     History:   Patient is a 72 y.o. female who comes in today for repeat evaluation. Patient has a left lateral tibial plateau fracture that has been treated nonoperatively for about 4 months now.  Pain had been slowly getting better but then she had a fall in the bathtub that caused acute worsening of the knee pain.  She saw Franky Macho in the office who noted some swelling on her knee and got an MRI.  Her pain has gotten significantly better since that fall in the bathtub.  She is ambulating without assistive devices or any braces.  Swelling has decreased since that fall as well.     Treatments tried: Norco, knee immobilizer, oxycodone     Physical Exam:   General: no acute distress, appears stated age, walking the halls with cane and knee immobilizer on Neurologic: alert, answering questions appropriately, following commands Respiratory: unlabored breathing on room air, symmetric chest rise Psychiatric: appropriate affect, normal cadence to speech     MSK (LLE): -Small palpable effusion, minimal tenderness palpation over the medial and lateral joint lines, negative Lachman, negative posterior drawer, knee stable to varus  valgus stress, pain with McMurray but no palpable click, range of motion from 0-100   Imaging: XR of the left knee taken 02/12/2023 was independently interpreted and reviewed, showing a lateral tibial plateau fracture with minimal joint depression that appears unchanged from films on 11/19/2022. Condylar widening less than 5mm.  No other fractures seen.   MRI of the left knee from 02/07/2023 was independently reviewed and interpreted, showing T2 signal in the subchondral area of the lateral plateau.  ACL and PCL intact.  Small amount of T2 signal within the posterior horn of the lateral meniscus, no other meniscal injuries seen.    Patient name: Sheri [name] Patient MRN: [id number] Date of visit: 02/12/23

## 2023-02-24 ENCOUNTER — Ambulatory Visit: Payer: 59 | Admitting: Physical Therapy

## 2023-03-09 ENCOUNTER — Ambulatory Visit: Payer: 59 | Admitting: Physical Therapy

## 2023-03-12 ENCOUNTER — Other Ambulatory Visit: Payer: Self-pay | Admitting: Cardiology

## 2023-03-14 ENCOUNTER — Other Ambulatory Visit: Payer: Self-pay | Admitting: Cardiology

## 2023-03-20 ENCOUNTER — Institutional Professional Consult (permissible substitution): Payer: 59 | Admitting: Pulmonary Disease

## 2023-03-24 ENCOUNTER — Ambulatory Visit: Payer: 59 | Admitting: Physical Therapy

## 2023-03-26 ENCOUNTER — Institutional Professional Consult (permissible substitution): Payer: 59 | Admitting: Pulmonary Disease

## 2023-03-30 ENCOUNTER — Other Ambulatory Visit: Payer: Self-pay | Admitting: *Deleted

## 2023-03-31 ENCOUNTER — Other Ambulatory Visit: Payer: Self-pay | Admitting: Internal Medicine

## 2023-03-31 DIAGNOSIS — M810 Age-related osteoporosis without current pathological fracture: Secondary | ICD-10-CM

## 2023-03-31 NOTE — Telephone Encounter (Signed)
Next appt scheduled 05/08/23 with PCP.

## 2023-04-06 ENCOUNTER — Institutional Professional Consult (permissible substitution): Payer: 59 | Admitting: Internal Medicine

## 2023-04-13 ENCOUNTER — Other Ambulatory Visit: Payer: Self-pay

## 2023-04-13 ENCOUNTER — Ambulatory Visit (INDEPENDENT_AMBULATORY_CARE_PROVIDER_SITE_OTHER): Payer: 59 | Admitting: Rehabilitative and Restorative Service Providers"

## 2023-04-13 ENCOUNTER — Encounter: Payer: Self-pay | Admitting: Rehabilitative and Restorative Service Providers"

## 2023-04-13 DIAGNOSIS — M25562 Pain in left knee: Secondary | ICD-10-CM

## 2023-04-13 DIAGNOSIS — G8929 Other chronic pain: Secondary | ICD-10-CM | POA: Diagnosis not present

## 2023-04-13 DIAGNOSIS — M6281 Muscle weakness (generalized): Secondary | ICD-10-CM

## 2023-04-13 DIAGNOSIS — R262 Difficulty in walking, not elsewhere classified: Secondary | ICD-10-CM | POA: Diagnosis not present

## 2023-04-13 DIAGNOSIS — R600 Localized edema: Secondary | ICD-10-CM

## 2023-04-13 DIAGNOSIS — M79662 Pain in left lower leg: Secondary | ICD-10-CM | POA: Diagnosis not present

## 2023-04-13 NOTE — Therapy (Addendum)
OUTPATIENT PHYSICAL THERAPY EVALUATION  / DISCHARGE   Patient Name: Sheri [name] MRN: [id number] DOB:28-Jul-1950, 72 y.o., female Today's Date: 04/13/2023  END OF SESSION:  PT End of Session - 04/13/23 1438     Visit Number 1    Number of Visits 16    Date for PT Re-Evaluation 06/08/23    Authorization Type [employer] Medicare 20% coinsurance    Progress Note Due on Visit 10    PT Start Time 1438    PT Stop Time 1507    PT Time Calculation (min) 29 min    Activity Tolerance Patient tolerated treatment well    Behavior During Therapy [facility] for tasks assessed/performed             Past Medical History:  Diagnosis Date   AF (atrial fibrillation) (HCC) 10/12/2015   Allergic rhinitis    Anxiety    ANXIETY DISORDER, GENERALIZED 08/18/2006   Qualifier: Diagnosis of  By: Sherlon Handing MD, Larene Pickett     Arthritis    no per pt   Atrial fibrillation with RVR (HCC)    Cancer (HCC)    left lower eyelid, basal cell cancer   Chiari I malformation (HCC) 03/07/2016   pt unsure of this   Chronic sinusitis    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   COPD (chronic obstructive pulmonary disease) (HCC)    no per pt   Depression    treated at [facility]   GERD (gastroesophageal reflux disease)    Goiter    hx of goiter   Hx of atrial tachycardia since age 45 03/15/2013   Memory loss 12/31/2016   Mnire's disease 03/07/2016   Palpitations    Associated with difficulty breathing   Panic attacks    Poisoning by selective serotonin reuptake inhibitors(969.03) Jan 2007   severe reaction with MAOI (nardil) and dextromethorphan   Postmenopausal 12/31/2016   SVT (supraventricular tachycardia) 03/15/2013   History of SVT s/p afib ablation, 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 Following with cardiology.    Swollen gland 09/14/2018   Tachycardia    Tonsillitis 11/12/2018   Past Surgical History:  Procedure Laterality Date   APPENDECTOMY     COLONOSCOPY     ELECTROPHYSIOLOGIC STUDY  N/A 10/12/2015   Procedure: Atrial Fibrillation Ablation;  Surgeon: Will [name], MD;  Location: [facility] INVASIVE CV LAB;  Service: Cardiovascular;  Laterality: N/A;   LID LESION EXCISION Left 08/06/2017   Procedure: EXCISION BIOPSY OF EYELID, RECONSTRUCTION OF EYELID;  Surgeon: Floydene Flock, MD;  Location: [facility] OR;  Service: Ophthalmology;  Laterality: Left;   OVARIAN CYST REMOVAL  09/1967   left ovarian cystectomy- 17.5 lb cyst removed- per pt   right mastoidectomy     ET tube placement in 1980 and 1990   right sinus surgery  october 2000   SVT ABLATION N/A 07/25/2020   Procedure: SVT ABLATION;  Surgeon: Regan Lemming, MD;  Location: [facility] INVASIVE CV LAB;  Service: Cardiovascular;  Laterality: N/A;   SVT ABLATION N/A 10/26/2020   Procedure: SVT ABLATION;  Surgeon: Regan Lemming, MD;  Location: [facility] INV[facility] CV [facility];  Service: Cardiovascular;  Laterality: N/A;   THYROIDECTOMY, PARTIAL     nodule removal - April 1996   Patient Active Problem List   Diagnosis Date Noted   Grief reaction 02/06/2023   Apical lung scarring 11/26/2022   Pure hypercholesterolemia 11/26/2022   Osteoarthritis of hands, bilateral 08/13/2022   BPV (benign positional vertigo) 04/30/2022   Vaginal  atrophy 01/17/2022   Varicose veins of both lower extremities with pain 01/17/2022   Atherosclerosis of aorta (HCC) 01/17/2022   Skin lesion of back 06/28/2021   Internal and external hemorrhoids without complication 08/10/2020   Recurrent major depressive disorder, in partial remission (HCC) 08/10/2020   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   Tinnitus of both ears 09/30/2018   Osteoporosis 01/26/2018   Postmenopausal 12/31/2016   Poor dentition 12/31/2016   Mnire's disease 03/07/2016   Chiari I malformation (HCC) 03/07/2016   Weight gain 09/21/2015   Healthcare maintenance 04/14/2013   SVT (supraventricular tachycardia) 03/15/2013    PCP: Inez Catalina, MD   REFERRING PROVIDER: London Sheer, MD   REFERRING DIAG:  Diagnosis  [id number] (ICD-10-CM) - Closed fracture of left tibial plateau, initial encounter    THERAPY DIAG:  Muscle weakness (generalized)  Pain in left lower leg  Chronic pain of left knee  Difficulty in walking, not elsewhere classified  Localized edema  Rationale for Evaluation and Treatment: Rehabilitation  ONSET DATE: 10/19/22 original injury  SUBJECTIVE:   SUBJECTIVE STATEMENT: Patient is s/p left lateral tibial plateau fracture following fall when walking down stairs.  Pt indicated secondary fall in tub that led to worsened symptoms.  Pt indicated continued pain/swelling in history.  Pt indicated better when sleeping.   Pt indicated complaints in lateral knee with transfers, sometimes with walking, squatting, stairs.  Pt indicated having brace previously (several months) but she thought it didn't hurt as much when wearing it.    PERTINENT HISTORY: left lateral tibial plateau fracture on 10/19/2022  A-fib, anxiety, arthritis, COPD, depression, GERD, atrial tachycardia, memory loss, meniere's disease, panic attacks, right mastoidectomy, right sinus surgery, SVT ablation, partial thyroidectomy.   PAIN:  NPRS scale: at worst 5//10, at current sitting 0/10 Pain location: Lt knee/lower leg laterally mainly  Pain description: sharp Aggravating factors: transfers, sometimes with walking, squatting, stairs.  Relieving factors: resting   PRECAUTIONS: None  WEIGHT BEARING RESTRICTIONS: No  FALLS:  Has patient fallen in last 6 months? Yes. Number of falls 1 on 10/19/22 date of injury  LIVING ENVIRONMENT: Lives in: House/apartment Stairs: 5 steps to enter with railing bilaterally  Has following equipment at home: cane  OCCUPATION: no work  PLOF: Independent, housework activity  PATIENT GOALS: Reduce pain  OBJECTIVE:   DIAGNOSTIC FINDINGS: 04/13/2023 review:   Imaging: XR of the left knee taken 02/12/2023 was independently  interpreted and reviewed, showing a lateral tibial plateau fracture with minimal joint depression that appears unchanged from films on 11/19/2022. Condylar widening less than 5mm.  No other fractures seen.   MRI of the left knee from 02/07/2023 was independently reviewed and interpreted, showing T2 signal in the subchondral area of the lateral plateau.  ACL and PCL intact.  Small amount of T2 signal within the posterior horn of the lateral meniscus, no other meniscal injuries seen.  PATIENT SURVEYS:  04/13/2023 FOTO intake:  50  predicted:  63  COGNITION: 04/13/2023 Overall cognitive status: WFL    SENSATION: 04/13/2023 WFL  EDEMA:  04/13/2023 Visually at knee Lt, posterior Lt knee joint swelling.   MUSCLE LENGTH: 04/13/2023 No specific testing today  PALPATION: 04/13/2023 tenderness to touch lateral knee, posterior knee.   LOWER EXTREMITY ROM:   ROM Right 04/13/2023 Left 04/13/2023  Hip flexion    Hip extension    Hip abduction    Hip adduction    Hip internal rotation    Hip external rotation    Knee  flexion 130 120 c pain noted   Knee extension    Ankle dorsiflexion    Ankle plantarflexion    Ankle inversion    Ankle eversion     (Blank rows = not tested)  LOWER EXTREMITY MMT:  MMT Right 04/13/2023 Left 04/13/2023  Hip flexion 5/5 5/5  Hip extension    Hip abduction    Hip adduction    Hip internal rotation    Hip external rotation    Knee flexion 5/5 5/5  Knee extension 5/5 4/5 c lateral knee pain  Ankle dorsiflexion 5/5 5/5  Ankle plantarflexion    Ankle inversion    Ankle eversion     (Blank rows = not tested)  LOWER EXTREMITY SPECIAL TESTS:  04/13/2023: no specific testing today  FUNCTIONAL TESTS:  04/13/2023:  18 inch chair without UE assist but painful noted Lt SLS: 3 seconds Rt SLS: 8 seconds  GAIT: 04/13/2023 Independent ambulation, decreased stance on Lt leg.                                                                                                                                                                          TODAY'S TREATMENT                                                                          DATE: 04/13/23 Therex: HEP instruction/performance c cues for techniques, handout provided.  Trial set performed of each for comprehension and symptom assessment.  See below for exercise list  PATIENT EDUCATION:  Education details: HEP, POC Person educated: Patient Education method: Explanation, Demonstration, Verbal cues, and Handouts Education comprehension: verbalized understanding, returned demonstration, and verbal cues required  HOME EXERCISE PROGRAM: Access Code: [email or web address] URL: [email or web address] Date: 04/13/2023 Prepared by: Chyrel Masson  Exercises - Supine Heel Slide (Mirrored)  - 1-2 x daily - 7 x weekly - 1 sets - 10 reps - 5 hold - Supine Quadricep Sets  - 1-2 x daily - 7 x weekly - 1 sets - 10 reps - 5 hold - Seated Quad Set (Mirrored)  - 3-5 x daily - 7 x weekly - 1 sets - 10 reps - 5 hold - Seated Long Arc Quad (Mirrored)  - 3-5 x daily - 7 x weekly - 1 sets - 5-10 reps - 2 hold - Seated Straight Leg Heel Taps  - 1-2 x daily - 7 x weekly - 3 sets - 10 reps  ASSESSMENT:  CLINICAL IMPRESSION: Patient is a 72 y.o. who comes to clinic with complaints of left leg pain c dx of left lateral tibial plateau fracture. Injury occurred on 10/19/22. Pt presents with mobility, strength and movement coordination deficits that impair their ability to perform usual daily and recreational functional activities without increase difficulty/symptoms at this time.  Patient to benefit from skilled PT services to address impairments and limitations to improve to previous level of function without restriction secondary to condition.   OBJECTIVE IMPAIRMENTS: decreased balance, difficulty walking, decreased ROM, decreased strength, increased edema, and pain.   ACTIVITY LIMITATIONS: bending, standing,  squatting, sleeping, and stairs  PARTICIPATION LIMITATIONS: cleaning, laundry, and community activity  PERSONAL FACTORS: 3+ comorbidities: see pertinent history   are also affecting patient's functional outcome.   REHAB POTENTIAL: Good  CLINICAL DECISION MAKING: Stable/uncomplicated  EVALUATION COMPLEXITY: Low   GOALS: Goals reviewed with patient? Yes  SHORT TERM GOALS: (target date for Short term goals are 3 weeks 05/04/2023)   1.  Patient will demonstrate independent use of home exercise program to maintain progress from in clinic treatments.  Goal status: New  LONG TERM GOALS: (target dates for all long term goals are  weeks  06/08/2023  )   1. Patient will demonstrate/report pain at worst less than or equal to 2/10 to facilitate minimal limitation in daily activity secondary to pain symptoms.  Goal status: New   2. Patient will demonstrate independent use of home exercise program to facilitate ability to maintain/progress functional gains from skilled physical therapy services.  Goal status: New   3. Patient will demonstrate FOTO outcome > or = 63 % to indicate reduced disability due to condition.  Goal status: New   4.  Patient will demonstrate Lt LE MMT 5/5 throughout to faciltiate usual transfers, stairs, squatting at [facility] for daily life.   Goal status: New   5.  Patient will demonstrate Lt knee  AROM WFL s symptoms to facilitate usual walking, standing and other daily movement at PLOF.  Goal status: New   6.  Patient will demonstrate ascending/descending stairs reciprocally s UE rail for easier household entry/exit.  Goal status: New  7.  Patient will demonstrate bilateral SLS testing > 10 seconds each to facilitate stability in ambulation.       PLAN:  PT FREQUENCY: 1-2x/week  PT DURATION: 8 weeks  PLANNED INTERVENTIONS: Therapeutic exercises, Therapeutic activity, Neuro Muscular re-education, Balance training, Gait training, Patient/Family education,  Joint mobilization, Stair training, DME instructions, Dry Needling, Electrical stimulation, Traction, Cryotherapy, vasopneumatic deviceMoist heat, Taping, Ultrasound, Ionotophoresis 4mg /ml Dexamethasone, and aquatic therapy, Manual therapy.  All included unless contraindicated  PLAN FOR NEXT SESSION: Review HEP knowledge/results, ROM and LE strengthening     Chyrel Masson, PT, DPT, OCS, ATC 04/13/23  3:18 PM   [employer]:  Date of referral: 02/12/2023 Referring provider: London Sheer, MD Referring diagnosis? Z61.096E (ICD-10-CM) - Closed fracture of left tibial plateau, initial encounter Treatment diagnosis? (if different than referring diagnosis) M 79.662  What was this (referring dx) caused by? Fall and Ongoing Issue  Ashby Dawes of Condition: Chronic (continuous duration > 3 months)   Laterality: Lt  Current Functional Measure Score: FOTO 50  Objective measurements identify impairments when they are compared to normal values, the uninvolved extremity, and prior level of function.  [x]  Yes  []  No  Objective assessment of functional ability: Moderate functional limitations   Briefly describe symptoms:  Pt indicated continued pain/swelling in history.  Pt indicated better when sleeping.  Pt indicated complaints in lateral knee with transfers, sometimes with walking, squatting, stairs.  Pt indicated having brace previously (several months) but she thought it didn't hurt as much when wearing it.    How did symptoms start: Patient is s/p left lateral tibial plateau fracture following fall when walking down stairs.  Pt indicated secondary fall in tub that led to worsened symptoms.   Average pain intensity:  Last 24 hours: moderate up to 5/10  Past week: moderate up to 5/10  How often does the pt experience symptoms? Frequently  How much have the symptoms interfered with usual daily activities? Moderately  How has condition changed since care began at this facility? NA - initial  visit  In general, how is the patients overall health? Good    PHYSICAL THERAPY DISCHARGE SUMMARY  Visits from Start of Care: 1  Current functional level related to goals / functional outcomes: See note   Remaining deficits: See note   Education / Equipment: HEP  Patient goals were not met. Patient is being discharged due to not returning since the last visit.  Chyrel Masson, PT, DPT, OCS, ATC 07/02/23  10:09 AM

## 2023-04-22 ENCOUNTER — Telehealth: Payer: Self-pay | Admitting: Rehabilitative and Restorative Service Providers"

## 2023-04-22 ENCOUNTER — Encounter: Payer: 59 | Admitting: Rehabilitative and Restorative Service Providers"

## 2023-04-22 NOTE — Telephone Encounter (Signed)
Called after 20 mins no show for appointment.  Pt indicated forgetting about appointment.  She did say she tried to call at lunchtime but couldn't get through.  Reminded her of next appointment.    Chyrel Masson, PT, DPT, OCS, ATC 04/22/23  1:22 PM

## 2023-04-23 ENCOUNTER — Telehealth: Payer: Self-pay | Admitting: *Deleted

## 2023-04-23 NOTE — Telephone Encounter (Signed)
Received a call from pt who stated her husband is still I the hospital and she's having crying spells. Stated she goes to [facility] and sees Sheri [name]. Stated he started her on Xanax on a trial pd. Stated she will be out Saturday and it really helps. Stated she had called his office and was told he will not prescribe anymore Xanax. She stated she does not know what to do; stated she does not have any plans to harm herself. She has an appt to see Sheri [name] in 2 weeks per pt. I told her I could call [facility] to see if they would give me any information. Pt also stated she's unable to see her husband b/c his family did something or convinced him to block her.  I talked to [facility] at [place] [phone])- she could not give me too much info She stated they usually do not prescribe Xanax (only certain circumstances) She stated the doctor had also put her on 2 other meds, she could not tell me, but stated once they get into her system , they should help.  Return a call to pt about what Sheri [name] had stated. Pt stated Sheri [name] had increase Effexor and Seroquel. She wants to know if Sheri [name] could prescribe enough Xanax until her appt w/Sheri [name] or with her which is on 10/18. I told pt I will send Sheri [name] her concerns.

## 2023-04-24 MED ORDER — LORAZEPAM 0.5 MG PO TABS: 0.5000 mg | ORAL_TABLET | Freq: Two times a day (BID) | ORAL | 0 refills | Status: DC

## 2023-04-24 NOTE — Telephone Encounter (Signed)
Called and spoke to [name] - -  She reiterated information told to nursing.  She is having a severe grief/stress reaction to her husband being in the hospital and it sounds like there is concern he has become paraplegic.  She is not able to go to the hospital at this time, but she did not divulge further information.    She has been on BID 0.5mg  of Alprazolam for the stress.  I would like to try a different medication for her.  She notes being on clonazepam in the past and then having to come off of it after a hospital stay.  Will try Lorazepam.    Comparable dose of 1mg  daily of alprazolam is 2mg  of lorazepam.  Due to history of adverse reaction, will decrease by 50% to ensure she does not have over sedation.   Plan 0.5mg  lorazepam BID PRN for anxiety/stress I reminded her that further prescriptions would need to come from her psychiatrist and a long term Rx from our office was not possible.   Debe Coder, MD

## 2023-04-24 NOTE — Telephone Encounter (Signed)
RTC from patient asking about getting enough Xanax until her appointment with Merlyn Albert or with her.  States that the medication.  Please call patient as well .

## 2023-04-27 ENCOUNTER — Encounter: Payer: 59 | Admitting: Rehabilitative and Restorative Service Providers"

## 2023-04-27 ENCOUNTER — Telehealth: Payer: Self-pay | Admitting: Rehabilitative and Restorative Service Providers"

## 2023-04-27 NOTE — Telephone Encounter (Signed)
Called patient after 15 mins no show for appointment today.  Left message and reminder of next appointment time, as well as call back number.  Today was second no show in a row so per clinic policy, cancelled visits after the next one on Oct 14th.  Chyrel Masson, PT, DPT, OCS, ATC 04/27/23  1:17 PM

## 2023-04-28 NOTE — Progress Notes (Unsigned)
This encounter was created in error - please disregard.

## 2023-04-29 ENCOUNTER — Ambulatory Visit: Payer: 59 | Admitting: Cardiology

## 2023-05-04 ENCOUNTER — Encounter: Payer: 59 | Admitting: Rehabilitative and Restorative Service Providers"

## 2023-05-08 ENCOUNTER — Ambulatory Visit (INDEPENDENT_AMBULATORY_CARE_PROVIDER_SITE_OTHER): Payer: 59 | Admitting: Internal Medicine

## 2023-05-08 ENCOUNTER — Other Ambulatory Visit: Payer: Self-pay | Admitting: Internal Medicine

## 2023-05-08 ENCOUNTER — Encounter: Payer: Self-pay | Admitting: Internal Medicine

## 2023-05-08 VITALS — BP 122/63 | HR 88 | Temp 98.1°F | Ht 65.5 in | Wt 153.7 lb

## 2023-05-08 DIAGNOSIS — J984 Other disorders of lung: Secondary | ICD-10-CM | POA: Diagnosis not present

## 2023-05-08 DIAGNOSIS — F4321 Adjustment disorder with depressed mood: Secondary | ICD-10-CM | POA: Diagnosis not present

## 2023-05-08 DIAGNOSIS — Z23 Encounter for immunization: Secondary | ICD-10-CM

## 2023-05-08 NOTE — Progress Notes (Unsigned)
Established Patient Office Visit  Subjective   Patient ID: Sheri [name], female    DOB: 16-Oct-1950  Age: 72 y.o. MRN: [id number]  Chief Complaint  Patient presents with   Follow-up   Depression    And anxiety.    Sheri [name] presents for follow up.  Since last being seen, her significant other was involved in a shooting and she has been having traumatic relationships with his family around his medical care.  I provided therapeutic listening today.    She has follow up with Orthopedics (now out of knee brace), pulmonary and cardiology. We updated her medication list as well.  She did get the flu shot today.    She noted increased stress, grief and anxiety while dealing with above.  She follows with psychiatry, who recently increased her venlafaxine.  I do not think further benzodiazepines are appropriate coming from our clinic given she follows with psychiatry.  We discussed that our clinic can offer therapy referral to [facility], which will be placed today.      Review of Systems  Constitutional:  Negative for chills and fever.  Cardiovascular:  Negative for chest pain and leg swelling.  Musculoskeletal:  Positive for joint pain. Negative for falls.  Psychiatric/Behavioral:  Positive for depression. The patient is nervous/anxious.       Objective:     BP 122/63 (BP Location: Right Arm, Patient Position: Sitting, Cuff Size: Normal)   Pulse 88   Temp 98.1 F (36.7 C) (Oral)   Ht 5' 5.5" (1.664 m)   Wt 153 lb 11.2 oz (69.7 kg)   SpO2 97% Comment: RA  BMI 25.19 kg/m  BP Readings from Last 3 Encounters:  05/08/23 122/63  02/06/23 104/62  11/25/22 (!) 110/58   Wt Readings from Last 3 Encounters:  05/08/23 153 lb 11.2 oz (69.7 kg)  02/06/23 163 lb (73.9 kg)  11/25/22 162 lb (73.5 kg)      Physical Exam Constitutional:      General: She is not in acute distress.    Appearance: Normal appearance. She is not toxic-appearing.  HENT:     Head: Normocephalic and atraumatic.   Pulmonary:     Effort: Pulmonary effort is normal. No respiratory distress.  Neurological:     Mental Status: She is alert.  Psychiatric:        Attention and Perception: Attention normal.        Mood and Affect: Mood is anxious and depressed. Affect is labile and tearful.        Speech: Speech normal.        Behavior: Behavior is agitated. Behavior is cooperative.        Thought Content: Thought content normal.        Cognition and Memory: Cognition normal.        Judgment: Judgment normal.     The 10-year ASCVD risk score (Arnett DK, et al., 2019) is: 11%    Assessment & Plan:   Problem List Items Addressed This Visit       Unprioritized   Apical lung scarring    She has an appointment with Pulmonology in November, I advised her to keep this appointment and discuss getting PFTs.       Grief reaction - Primary    This was the main issue we discussed today.  She was very tearful at times.  Her significant other for many years is no paraplegic and in [facility].  She is having a difficult  time with his family and she has been barred from seeing him.  She follows with psychiatry who gave her a short course of alprazolam to deal with the increased anxiety/stress.  I provided her with a 2 week supply to bridge time to get to see her psychiatrist.  It has been made clear to Sheri [name] that further refills should come through her psychiatrist and are not appropriate for this diagnosis/life situation. I did offer her referral to our behavioral health technician, Marena Chancy, and this was placed today.  She is due to see her psychiatrist after today's visit.   Given the situation with Mr. Yanos family, I advised her to seek out Legal council to advise her. Follow up in 3 months.       Relevant Orders   Ambulatory referral to Social Work   Other Visit Diagnoses     Encounter for immunization       Relevant Orders   Flu Vaccine Trivalent High Dose (Fluad) (Completed)       Return  in about 3 months (around 08/08/2023).    Debe Coder, MD

## 2023-05-08 NOTE — Patient Instructions (Addendum)
Ms. Lust - -  Thank you for coming in to see me today. .  For your anxiety, please discuss options with your psychiatrist.    I would also advise you to seek legal counsel for the issues with your home, family and dogs.   Please keep your appointment with Cardiology and Pulmonology so that you can take care of yourself while you go through this difficult time.   Thank you!  Debe Coder, MD

## 2023-05-09 ENCOUNTER — Encounter: Payer: Self-pay | Admitting: Internal Medicine

## 2023-05-09 NOTE — Assessment & Plan Note (Signed)
She has an appointment with Pulmonology in November, I advised her to keep this appointment and discuss getting PFTs.

## 2023-05-09 NOTE — Assessment & Plan Note (Signed)
This was the main issue we discussed today.  She was very tearful at times.  Her significant other for many years is no paraplegic and in [facility].  She is having a difficult time with his family and she has been barred from seeing him.  She follows with psychiatry who gave her a short course of alprazolam to deal with the increased anxiety/stress.  I provided her with a 2 week supply to bridge time to get to see her psychiatrist.  It has been made clear to Ms. Borror that further refills should come through her psychiatrist and are not appropriate for this diagnosis/life situation. I did offer her referral to our behavioral health technician, Marena Chancy, and this was placed today.  She is due to see her psychiatrist after today's visit.   Given the situation with Mr. Gillick family, I advised her to seek out Legal council to advise her. Follow up in 3 months.

## 2023-05-11 ENCOUNTER — Encounter: Payer: 59 | Admitting: Rehabilitative and Restorative Service Providers"

## 2023-05-12 ENCOUNTER — Telehealth: Payer: Self-pay | Admitting: *Deleted

## 2023-05-12 NOTE — Telephone Encounter (Signed)
Please see previous response

## 2023-05-12 NOTE — Telephone Encounter (Signed)
That is medication is not appropriate for refill, which we discussed multiple times during her clinic visit.   Please tell Sheri [name] that the medication is not appropriate, as her psychiatrist agrees that it is not a long term medication, than we will not continue to fill.  She should seek out counseling which we did refer her to.  Thank you.   I have also sent her a MyChart message detailing this, which she can refer to.

## 2023-05-12 NOTE — Telephone Encounter (Signed)
Call from pt who stated after her LOV with Dr Criselda Peaches; she was told to talk to her psych doctor about refilling Lorazepam. She stated the doctor told her to have her PCP to continue refilling this med. Pt stated she will be out of medication today. And stated Dr Criselda Peaches can call her if she has any questions.

## 2023-05-12 NOTE — Telephone Encounter (Signed)
Pt called again about her med refill .Charline Bills asked Venita Sheffield RN can I go ahead and read the message to her from her PCP .Marland Kitchen And I did  pt then stated that her PCP  didn't speak to  her  psychiatrist  and she needs to  cal her .Marland Kitchen I then continued the message  letting her know that her PCP  stated that  she did put a message in [place] .Marland KitchenMarland KitchenMarland Kitchen

## 2023-05-14 ENCOUNTER — Ambulatory Visit: Payer: 59 | Admitting: Orthopedic Surgery

## 2023-05-18 ENCOUNTER — Encounter: Payer: 59 | Admitting: Rehabilitative and Restorative Service Providers"

## 2023-05-18 ENCOUNTER — Telehealth: Payer: Self-pay | Admitting: *Deleted

## 2023-05-18 NOTE — Addendum Note (Signed)
Addended by: Maura Crandall on: 05/18/2023 03:31 PM   Modules accepted: Orders

## 2023-05-18 NOTE — Progress Notes (Signed)
  Care Coordination   Note   05/18/2023 Name: Sheri [name] MRN: [id number] DOB: [birth year]  Sheri [name] is a 72 y.o. year old female who sees Inez Catalina, MD for primary care. I reached out to [name] by phone today to offer care coordination services.  Sheri [name] was given information about Care Coordination services today including:   The Care Coordination services include support from the care team which includes your Nurse Coordinator, Clinical Social Worker, or Pharmacist.  The Care Coordination team is here to help remove barriers to the health concerns and goals most important to you. Care Coordination services are voluntary, and the patient may decline or stop services at any time by request to their care team member.   Care Coordination Consent Status: Patient agreed to services and verbal consent obtained.   Follow up plan:  Telephone appointment with care coordination team member scheduled for:  10/31  Encounter Outcome:  Patient Scheduled [facility] Coordination Care Guide  Direct Dial: [phone]

## 2023-05-19 ENCOUNTER — Other Ambulatory Visit: Payer: Self-pay | Admitting: Internal Medicine

## 2023-05-19 DIAGNOSIS — F4321 Adjustment disorder with depressed mood: Secondary | ICD-10-CM

## 2023-05-20 ENCOUNTER — Ambulatory Visit (HOSPITAL_COMMUNITY): Payer: 59

## 2023-05-20 ENCOUNTER — Encounter (HOSPITAL_COMMUNITY): Payer: Self-pay | Admitting: Emergency Medicine

## 2023-05-20 ENCOUNTER — Ambulatory Visit (HOSPITAL_COMMUNITY)
Admission: EM | Admit: 2023-05-20 | Discharge: 2023-05-20 | Disposition: A | Discharge status: Home / Self Care | Payer: 59 | Attending: Internal Medicine | Admitting: Internal Medicine

## 2023-05-20 DIAGNOSIS — S59001A Unspecified physeal fracture of lower end of ulna, right arm, initial encounter for closed fracture: Secondary | ICD-10-CM | POA: Diagnosis not present

## 2023-05-20 DIAGNOSIS — S62657A Nondisplaced fracture of medial phalanx of left little finger, initial encounter for closed fracture: Secondary | ICD-10-CM | POA: Diagnosis not present

## 2023-05-20 DIAGNOSIS — S52691A Other fracture of lower end of right ulna, initial encounter for closed fracture: Secondary | ICD-10-CM

## 2023-05-20 DIAGNOSIS — S62617A Displaced fracture of proximal phalanx of left little finger, initial encounter for closed fracture: Secondary | ICD-10-CM | POA: Diagnosis not present

## 2023-05-20 MED ORDER — HYDROCODONE-ACETAMINOPHEN 5-325 MG PO TABS: 1.0000 | ORAL_TABLET | Freq: Four times a day (QID) | ORAL | 0 refills | Status: DC | PRN

## 2023-05-20 NOTE — Progress Notes (Signed)
Orthopedic Tech Progress Note Patient Details:  Sheri [name] July 29, 1950 [id number]  Ortho Devices Type of Ortho Device: Arm sling, Sugartong splint Ortho Device/Splint Location: rue Ortho Device/Splint Interventions: Ordered, Application, Adjustment   Post Interventions Patient Tolerated: Well Instructions Provided: Care of device, Adjustment of device  Trinna Post 05/20/2023, 6:45 PM
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
## 2023-05-20 NOTE — ED Notes (Signed)
This RN called ortho tech at this time.

## 2023-05-20 NOTE — Discharge Instructions (Signed)
You have fractured your right forearm (ulna) and your left pinky finger. We placed your right forearm in a splint, avoid getting the splint wet. Wear splint at all times and do not remove it until your orthopedic follow-up appointment.  Rest, ice, elevate, and compress the injury to reduce swelling and inflammation.   Please take tylenol regular strength 650mg  every 6 hours as needed with food for pain. If you still have pain despite taking ibuprofen regularly, this is called breakthrough pain.  You can use hydrocodone, a narcotic pain medicine, once every 4-6 hours for this.  Once your pain is better controlled, switch back to just regular tylenol.  Avoid use of extra strength tylenol while taking hydrocodone since this medication already has tylenol in it.   Schedule an appointment with the orthopedic provider listed on your paperwork for follow-up in the next 3-5 days.  Return if you experience worsening pain, numbness, tingling, skin color changes, or any other concerning symptoms.  I need you to watch out for signs of head injury such as sudden onset nausea and vomiting, severe head pain, vision changes, dizziness, etc.  If you experience any of the symptoms, you need to go to the emergency department immediately.
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
## 2023-05-20 NOTE — Progress Notes (Signed)
This encounter was created in error - please disregard  I called patient 3 times, 2nd time, I left a detailed message that I would call back after several minutes.  On the 3rd time calling, patient VM came on again.  I left a message for her to call office to reschedule visit.

## 2023-05-21 ENCOUNTER — Ambulatory Visit: Payer: Self-pay | Admitting: *Deleted
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
## 2023-05-22 ENCOUNTER — Telehealth: Payer: Self-pay | Admitting: Cardiology

## 2023-05-22 MED ORDER — DILTIAZEM HCL ER COATED BEADS 180 MG PO CP24: 180.0000 mg | ORAL_CAPSULE | Freq: Every day | ORAL | 3 refills | Status: DC

## 2023-05-22 NOTE — Telephone Encounter (Signed)
Patient c/o Palpitations:  High priority if patient c/o lightheadedness, shortness of breath, or chest pain  How long have you had palpitations/irregular HR/ Afib? Are you having the symptoms now?  Patient states her heart has been skipping beats for the past week and she can feel it happening now.  Are you currently experiencing lightheadedness, SOB or CP?  No   Do you have a history of afib (atrial fibrillation) or irregular heart rhythm?  Yes   Have you checked your BP or HR? (document readings if available):  No patient states she does not have a way to check it.  Are you experiencing any other symptoms?  No

## 2023-05-22 NOTE — Telephone Encounter (Signed)
Spent about 12 minutes on the phone with the patient. States for about a week now she is having what she feels like is skipped beats.  "It may not be skipping, but that is what it feels like" States it is constant. She does not know what her HRs are, "I don't have one of those heart things at home". She denies that heart is racing. She denies any symptoms other than skipped beats. She is "dealing with a lot of stress". She wants to be seen today. Informed pt that being seen today is not possible. Explained that currently soonest right now would be her scheduled appt next Thursday, especially since this is not urgent since it has occurred for several days and she is not having any symptoms.   She was not happy with this response. I advised her if she felt that urgent she could go to the ED for evaluation. She was not happy with this either. She reports that she has been taking extra Propranolol on her own -- I advised her that she should discuss with w/ [place] before doing that. She thinks taking it more than twice a day will help. Explained that I will send to [facility] for advisement - but hard to say what he will do since we do not know what the exact rhythm is.  Aware he may be ok with increasing medication until OV next Thursday. Aware I will call her back w/ his response, before I leave the office today.
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
## 2023-05-25 ENCOUNTER — Other Ambulatory Visit: Payer: Self-pay | Admitting: Internal Medicine

## 2023-05-25 ENCOUNTER — Encounter: Payer: 59 | Admitting: Rehabilitative and Restorative Service Providers"

## 2023-05-25 ENCOUNTER — Ambulatory Visit: Payer: 59 | Admitting: Orthopaedic Surgery

## 2023-05-25 ENCOUNTER — Ambulatory Visit: Payer: Self-pay | Admitting: *Deleted

## 2023-05-25 DIAGNOSIS — M810 Age-related osteoporosis without current pathological fracture: Secondary | ICD-10-CM
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
## 2023-05-26 ENCOUNTER — Ambulatory Visit (INDEPENDENT_AMBULATORY_CARE_PROVIDER_SITE_OTHER): Payer: 59 | Admitting: Orthopaedic Surgery

## 2023-05-26 DIAGNOSIS — S62617A Displaced fracture of proximal phalanx of left little finger, initial encounter for closed fracture: Secondary | ICD-10-CM | POA: Diagnosis not present

## 2023-05-26 DIAGNOSIS — S52231A Displaced oblique fracture of shaft of right ulna, initial encounter for closed fracture: Secondary | ICD-10-CM | POA: Diagnosis not present

## 2023-05-26 DIAGNOSIS — S52201A Unspecified fracture of shaft of right ulna, initial encounter for closed fracture: Secondary | ICD-10-CM | POA: Insufficient documentation

## 2023-05-26 MED ORDER — HYDROCODONE-ACETAMINOPHEN 5-325 MG PO TABS: 1.0000 | ORAL_TABLET | Freq: Four times a day (QID) | ORAL | 0 refills | Status: DC | PRN
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
## 2023-05-27 ENCOUNTER — Encounter: Payer: Self-pay | Admitting: *Deleted

## 2023-05-28 ENCOUNTER — Encounter: Payer: Self-pay | Admitting: Student

## 2023-05-28 ENCOUNTER — Ambulatory Visit: Payer: 59 | Attending: Student | Admitting: Student

## 2023-05-28 VITALS — BP 138/80 | HR 74 | Ht 65.5 in | Wt 154.8 lb

## 2023-05-28 DIAGNOSIS — I480 Paroxysmal atrial fibrillation: Secondary | ICD-10-CM

## 2023-05-28 DIAGNOSIS — I471 Supraventricular tachycardia, unspecified: Secondary | ICD-10-CM | POA: Diagnosis not present

## 2023-05-28 DIAGNOSIS — I493 Ventricular premature depolarization: Secondary | ICD-10-CM

## 2023-05-28 NOTE — Patient Instructions (Signed)
Medication Instructions:  Your physician recommends that you continue on your current medications as directed. Please refer to the Current Medication list given to you today.  *If you need a refill on your cardiac medications before your next appointment, please call your pharmacy*  Lab Work: None ordered If you have labs (blood work) drawn today and your tests are completely normal, you will receive your results only by: MyChart Message (if you have MyChart) OR A paper copy in the mail If you have any lab test that is abnormal or we need to change your treatment, we will call you to review the results.  Follow-Up: At [facility], you and your health needs are our priority.  As part of our continuing mission to provide you with exceptional heart care, we have created designated Provider Care Teams.  These Care Teams include your primary Cardiologist (physician) and Advanced Practice Providers (APPs -  Physician Assistants and Nurse Practitioners) who all work together to provide you with the care you need, when you need it.  Your next appointment:   6 month(s)  Provider:   Will Camnitz, MD  
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
## 2023-06-02 ENCOUNTER — Ambulatory Visit: Payer: 59 | Admitting: Orthopaedic Surgery

## 2023-06-02 ENCOUNTER — Other Ambulatory Visit (INDEPENDENT_AMBULATORY_CARE_PROVIDER_SITE_OTHER): Payer: 59

## 2023-06-02 ENCOUNTER — Encounter: Payer: Self-pay | Admitting: Orthopaedic Surgery

## 2023-06-02 VITALS — Ht 65.5 in | Wt 154.0 lb

## 2023-06-02 DIAGNOSIS — S62617A Displaced fracture of proximal phalanx of left little finger, initial encounter for closed fracture: Secondary | ICD-10-CM | POA: Diagnosis not present

## 2023-06-02 DIAGNOSIS — S52231A Displaced oblique fracture of shaft of right ulna, initial encounter for closed fracture: Secondary | ICD-10-CM
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
## 2023-06-04 ENCOUNTER — Ambulatory Visit: Payer: 59 | Admitting: Orthopedic Surgery

## 2023-06-05 ENCOUNTER — Institutional Professional Consult (permissible substitution): Payer: 59 | Admitting: Internal Medicine

## 2023-06-05 ENCOUNTER — Telehealth: Payer: Self-pay | Admitting: Cardiology

## 2023-06-05 MED ORDER — PROPRANOLOL HCL 20 MG PO TABS: 20.0000 mg | ORAL_TABLET | Freq: Two times a day (BID) | ORAL | 3 refills | Status: DC

## 2023-06-05 NOTE — Telephone Encounter (Signed)
*  STAT* If patient is at the pharmacy, call can be transferred to refill team.   1. Which medications need to be refilled? (please list name of each medication and dose if known)   propranolol (INDERAL) 20 MG tablet    2. Which pharmacy/location (including street and city if local pharmacy) is medication to be sent to?  [facility] - [place] ([place]), [place] - [address]      3. Do they need a 30 day or 90 day supply? 90 day    Pt is out of medication

## 2023-06-05 NOTE — Telephone Encounter (Signed)
Pt's medication was sent to pt's pharmacy as requested. Confirmation received.  °

## 2023-06-08 ENCOUNTER — Telehealth: Payer: Self-pay | Admitting: Orthopaedic Surgery

## 2023-06-08 ENCOUNTER — Other Ambulatory Visit: Payer: Self-pay | Admitting: Orthopaedic Surgery

## 2023-06-08 MED ORDER — HYDROCODONE-ACETAMINOPHEN 5-325 MG PO TABS: 1.0000 | ORAL_TABLET | Freq: Four times a day (QID) | ORAL | 0 refills | Status: DC | PRN

## 2023-06-08 NOTE — Telephone Encounter (Signed)
Patient called needing Rx refilled Hydrocodone. The number to contact patient is [phone]

## 2023-06-08 NOTE — Telephone Encounter (Signed)
I called patient and advised. 

## 2023-06-10 ENCOUNTER — Ambulatory Visit: Payer: 59 | Admitting: Orthopedic Surgery

## 2023-06-15 ENCOUNTER — Ambulatory Visit: Payer: 59 | Admitting: Orthopedic Surgery

## 2023-06-15 ENCOUNTER — Ambulatory Visit: Payer: Self-pay | Admitting: *Deleted
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
## 2023-06-17 ENCOUNTER — Other Ambulatory Visit: Payer: Self-pay | Admitting: Orthopaedic Surgery

## 2023-06-17 ENCOUNTER — Telehealth: Payer: Self-pay | Admitting: Orthopedic Surgery

## 2023-06-17 MED ORDER — HYDROCODONE-ACETAMINOPHEN 5-325 MG PO TABS: 1.0000 | ORAL_TABLET | Freq: Four times a day (QID) | ORAL | 0 refills | Status: AC | PRN

## 2023-06-17 NOTE — Telephone Encounter (Signed)
Patient called. She would like a refill on hydrocodone. Her cb# is [phone]

## 2023-06-22 ENCOUNTER — Ambulatory Visit: Payer: 59 | Admitting: Orthopedic Surgery

## 2023-06-23 ENCOUNTER — Ambulatory Visit: Payer: 59 | Admitting: Orthopaedic Surgery

## 2023-06-23 ENCOUNTER — Other Ambulatory Visit: Payer: Self-pay | Admitting: Internal Medicine

## 2023-06-23 DIAGNOSIS — M810 Age-related osteoporosis without current pathological fracture: Secondary | ICD-10-CM

## 2023-06-24 ENCOUNTER — Telehealth: Payer: Self-pay | Admitting: Student

## 2023-06-24 NOTE — Telephone Encounter (Signed)
meclizine (ANTIVERT) 25 MG tablet   alendronate (FOSAMAX) 70 MG tablet   [facility] - [place] ([place]), [place] - [address]

## 2023-06-25 ENCOUNTER — Encounter: Payer: Self-pay | Admitting: *Deleted

## 2023-07-01 ENCOUNTER — Other Ambulatory Visit: Payer: Self-pay

## 2023-07-01 DIAGNOSIS — H8109 Meniere's disease, unspecified ear: Secondary | ICD-10-CM

## 2023-07-01 MED ORDER — MECLIZINE HCL 25 MG PO TABS: 25.0000 mg | ORAL_TABLET | Freq: Three times a day (TID) | ORAL | 3 refills | Status: DC | PRN

## 2023-07-02 ENCOUNTER — Ambulatory Visit: Payer: Self-pay | Admitting: *Deleted
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
## 2023-07-07 ENCOUNTER — Telehealth: Payer: Self-pay | Admitting: Orthopaedic Surgery

## 2023-07-07 NOTE — Telephone Encounter (Signed)
Patient called and said that she can't do the morning  because she has another appointment. ZO#[phone]

## 2023-07-08 NOTE — Telephone Encounter (Signed)
Added to schedule.

## 2023-07-10 ENCOUNTER — Ambulatory Visit: Payer: 59 | Admitting: Orthopaedic Surgery

## 2023-07-16 ENCOUNTER — Ambulatory Visit: Payer: 59 | Attending: Internal Medicine | Admitting: Internal Medicine

## 2023-07-16 ENCOUNTER — Telehealth: Payer: Self-pay | Admitting: Cardiology

## 2023-07-16 ENCOUNTER — Encounter: Payer: Self-pay | Admitting: Internal Medicine

## 2023-07-16 VITALS — BP 112/70 | HR 72 | Ht 65.0 in | Wt 153.6 lb

## 2023-07-16 DIAGNOSIS — I493 Ventricular premature depolarization: Secondary | ICD-10-CM

## 2023-07-16 DIAGNOSIS — I480 Paroxysmal atrial fibrillation: Secondary | ICD-10-CM

## 2023-07-16 DIAGNOSIS — I471 Supraventricular tachycardia, unspecified: Secondary | ICD-10-CM

## 2023-07-16 DIAGNOSIS — R002 Palpitations: Secondary | ICD-10-CM

## 2023-07-16 MED ORDER — FLECAINIDE ACETATE 50 MG PO TABS: 50.0000 mg | ORAL_TABLET | Freq: Two times a day (BID) | ORAL | 3 refills | Status: DC

## 2023-07-16 NOTE — Telephone Encounter (Signed)
Patient c/o Palpitations:  STAT if patient reporting lightheadedness, shortness of breath, or chest pain  How long have you had palpitations/irregular HR/ Afib? Are you having the symptoms now? Several days/No  Are you currently experiencing lightheadedness, SOB or CP? No  Do you have a history of afib (atrial fibrillation) or irregular heart rhythm? Yes  Have you checked your BP or HR? (document readings if available): No  Are you experiencing any other symptoms? Not able to sleep has gotten worse the last 2 days

## 2023-07-16 NOTE — Progress Notes (Signed)
HPI [name] presents today as an unscheduled add on for a h/o palpitations. She is a 72 yo woman with a h/o atrial fib s/p PVI, who then developed recurrent palpitations and was found to have SVT. She has undergone 2 ep studies and with each had no inducible SVT. She had spontaneous SVT in the holding area after her second EP study. She has been bothered by worsening palpitations which sound like PVC's. She has not had syncope but she feels irregular heart beats. No chest pain or sob.  Allergies  Allergen Reactions   Guaifenesin Palpitations     Current Outpatient Medications  Medication Sig Dispense Refill   alendronate (FOSAMAX) 70 MG tablet TAKE 1 TABLET BY MOUTH ONCE A WEEK ON AN EMPTY STOMACH WITH A FULL GLASS OF WATER 4 tablet 0   flecainide (TAMBOCOR) 50 MG tablet Take 1 tablet (50 mg total) by mouth 2 (two) times daily. 180 tablet 3   fluticasone (FLONASE) 50 MCG/ACT nasal spray Use 2 spray(s) in each nostril once daily 16 g 0   HYDROcodone-acetaminophen (NORCO/VICODIN) 5-325 MG tablet Take 1 tablet by mouth every 6 (six) hours as needed. 8 tablet 0   meclizine (ANTIVERT) 25 MG tablet Take 1 tablet (25 mg total) by mouth 3 (three) times daily as needed for dizziness. 60 tablet 3   pravastatin (PRAVACHOL) 20 MG tablet Take 1 tablet (20 mg total) by mouth daily. 90 tablet 3   propranolol (INDERAL) 20 MG tablet Take 1 tablet (20 mg total) by mouth 2 (two) times daily. 180 tablet 3   QUEtiapine (SEROQUEL) 200 MG tablet Take 100 mg by mouth at bedtime.     traZODone (DESYREL) 100 MG tablet Take 100 mg by mouth at bedtime.     umeclidinium bromide (INCRUSE ELLIPTA) 62.5 MCG/ACT AEPB Inhale 1 puff into the lungs daily. 30 each 11   venlafaxine XR (EFFEXOR-XR) 75 MG 24 hr capsule Take 150 mg by mouth daily. Taking 225mg  total per day.     hydrocortisone 2.5 % cream Apply topically daily as needed (Pain from hemorrhoids). (Patient not taking: Reported on 07/16/2023) 28 g 2    venlafaxine (EFFEXOR) 75 MG tablet Take by mouth. (Patient not taking: Reported on 07/16/2023)     No current facility-administered medications for this visit.     Past Medical History:  Diagnosis Date   AF (atrial fibrillation) (HCC) 10/12/2015   Allergic rhinitis    Anxiety    ANXIETY DISORDER, GENERALIZED 08/18/2006   Qualifier: Diagnosis of  By: Sheri Handing MD, Sheri [name]     Arthritis    no per pt   Atrial fibrillation with RVR (HCC)    Cancer (HCC)    left lower eyelid, basal cell cancer   Chiari I malformation (HCC) 03/07/2016   pt unsure of this   Chronic sinusitis    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   COPD (chronic obstructive pulmonary disease) (HCC)    no per pt   Depression    treated at [facility]   GERD (gastroesophageal reflux disease)    Goiter    hx of goiter   Hx of atrial tachycardia since age 12 03/15/2013   Memory loss 12/31/2016   Mnire's disease 03/07/2016   Palpitations    Associated with difficulty breathing   Panic attacks    Poisoning by selective serotonin reuptake inhibitors(969.03) Jan 2007   severe reaction with MAOI (nardil) and dextromethorphan   Postmenopausal 12/31/2016   SVT (supraventricular  tachycardia) (HCC) 03/15/2013   History of SVT s/p afib ablation, 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 Following with cardiology.    Swollen gland 09/14/2018   Tachycardia    Tonsillitis 11/12/2018    ROS:   All systems reviewed and negative except as noted in the HPI.   Past Surgical History:  Procedure Laterality Date   APPENDECTOMY     COLONOSCOPY     ELECTROPHYSIOLOGIC STUDY N/A 10/12/2015   Procedure: Atrial Fibrillation Ablation;  Surgeon: Sheri [name], MD;  [facility]: [facility];  Service: Cardiovascular;  Laterality: N/A;   LID LESION EXCISION Left 08/06/2017   Procedure: EXCISION BIOPSY OF EYELID, RECONSTRUCTION OF EYELID;  Surgeon: Sheri Flock, MD;  Location: [facility] OR;  Service:  Ophthalmology;  Laterality: Left;   OVARIAN CYST REMOVAL  09/1967   left ovarian cystectomy- 17.5 lb cyst removed- per pt   right mastoidectomy     ET tube placement in 1980 and 1990   right sinus surgery  october 2000   SVT ABLATION N/A 07/25/2020   Procedure: SVT ABLATION;  Surgeon: Sheri Lemming, MD;  [facility];  Service: Cardiovascular;  Laterality: N/A;   SVT ABLATION N/A 10/26/2020   Procedure: SVT ABLATION;  Surgeon: Sheri Lemming, MD;  [facility];  Service: Cardiovascular;  Laterality: N/A;   THYROIDECTOMY, PARTIAL     nodule removal - April 1996     Family History  Problem Relation Age of Onset   Hypertension Mother    Hypertension Father    Psoriasis Father    Heart attack Father    Atrial fibrillation Sister        had a successful a fib ablation   Atrial fibrillation Sister        had a successful a fib ablation   Breast cancer Niece 3   Colon cancer Neg Hx    Esophageal cancer Neg Hx    Stomach cancer Neg Hx    Rectal cancer Neg Hx    Colon polyps Neg Hx      Social History   Socioeconomic History   Marital status: Widowed    Spouse name: Not on file   Number of children: Not on file   Years of education: Not on file   Highest education level: Not on file  Occupational History   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   Social Drivers of Corporate investment banker Strain: Low Risk  (05/21/2023)   Overall Financial Resource Strain (CARDIA)    Difficulty of Paying Living Expenses: Not very hard  Food Insecurity:  No Food Insecurity (05/21/2023)   Hunger Vital Sign    Worried About Running Out of Food in the Last Year: Never true    Ran Out of Food in the Last Year: Never true  Transportation Needs: No Transportation Needs (05/21/2023)   PRAPARE - Transportation    Lack of Transportation (Medical): No    Lack of  Transportation (Non-Medical): No  Physical Activity: Not on file  Stress: Not on file  Social Connections: Unknown (08/11/2022)   Social Connection and Isolation Panel [NHANES]    Frequency of Communication with Friends and Family: Three times a week    Frequency of Social Gatherings with Friends and Family: Once a week    Attends Religious Services: Never    Database administrator or Organizations: No    Attends Banker Meetings: Never    Marital Status: Patient declined  Catering manager Violence: Not At Risk (08/11/2022)   Humiliation, Afraid, Rape, and Kick questionnaire    Fear of Current or Ex-Partner: No    Emotionally Abused: No    Physically Abused: No    Sexually Abused: No     BP 112/70 (BP Location: Left Arm, Patient Position: Sitting, Cuff Size: Normal)   Pulse 72   Ht 5\' 5"  (1.651 m)   Wt 153 lb 9.6 oz (69.7 kg)   SpO2 97%   BMI 25.56 kg/m   Physical Exam:  Well appearing 72 yo woman, NAD HEENT: Unremarkable Neck:  No JVD, no thyromegally Lymphatics:  No adenopathy Back:  No CVA tenderness Lungs:  Clear with no wheezes HEART:  Regular rate rhythm, no murmurs, no rubs, no clicks Abd:  soft, positive bowel sounds, no organomegally, no rebound, no guarding Ext:  2 plus pulses, no edema, no cyanosis, no clubbing Skin:  No rashes no nodules Neuro:  CN II through XII intact, motor grossly intact  EKG - NSR   Assess/Plan: Recurrent palpitations - I have discussed the treatment options and asked her to try flecainide at low dose and to stop the cardizem which does not appear to make a difference. Hopefully her symptoms Sheri improve. I would  anticipate a heart monitor as an outpatient and followup with Dr. Derek Jack. She Sheri have an ECG in about 3 weeks.  Sharlot Gowda Sheri Lookabaugh,MD

## 2023-07-16 NOTE — Patient Instructions (Addendum)
Medication Instructions:  Your physician has recommended you make the following change in your medication:  Stop cardizem. Start flecainide 50 mg twice daily.  Lab Work: None ordered.  If you have labs (blood work) drawn today and your tests are completely normal, you will receive your results only by: MyChart Message (if you have MyChart) OR A paper copy in the mail If you have any lab test that is abnormal or we need to change your treatment, we will call you to review the results.  Testing/Procedures: None ordered.  Follow-Up: At [facility], you and your health needs are our priority.  As part of our continuing mission to provide you with exceptional heart care, we have created designated Provider Care Teams.  These Care Teams include your primary Cardiologist (physician) and Advanced Practice Providers (APPs -  Physician Assistants and Nurse Practitioners) who all work together to provide you with the care you need, when you need it.   Your next appointment:   3 weeks EKG on a day Dr Elberta Fortis is in office 6 weeks follow up with Dr Elberta Fortis  The format for your next appointment:   In Person  Provider:   Dr Camnitz{or one of the following Advanced Practice Providers on your designated Care Team:   Francis Dowse, PA-C Casimiro Needle "Mardelle Matte" Hall, [place] Earnest Rosier, NP   Important Information About Sugar

## 2023-07-16 NOTE — Telephone Encounter (Signed)
Spoke with pt who is reporting an increase in palps and some shortness of breath over the past several days (maybe 5).  She reports it is skipping and causing to wake during the night.  She reports "stress" is making it worse and tearful while speaking with this nurse.  She reports taking medications as listed.  Denies any increase in stimulants - either medications or food/alcohol.  She does not know her HR-just feels it is more irregular recently and seems to be being worse.  She denies any chest pain. Having some shortness of breath with activity and when her heart beat is skipping.   Added onto Dr Lubertha Basque schedule today at 3 pm for further evaluation.

## 2023-07-17 ENCOUNTER — Encounter: Payer: Self-pay | Admitting: Orthopaedic Surgery

## 2023-07-17 ENCOUNTER — Other Ambulatory Visit: Payer: Self-pay

## 2023-07-17 ENCOUNTER — Ambulatory Visit (INDEPENDENT_AMBULATORY_CARE_PROVIDER_SITE_OTHER): Payer: 59 | Admitting: Orthopaedic Surgery

## 2023-07-17 VITALS — Ht 65.0 in | Wt 153.0 lb

## 2023-07-17 DIAGNOSIS — S62617A Displaced fracture of proximal phalanx of left little finger, initial encounter for closed fracture: Secondary | ICD-10-CM

## 2023-07-17 DIAGNOSIS — S52231A Displaced oblique fracture of shaft of right ulna, initial encounter for closed fracture: Secondary | ICD-10-CM
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
## 2023-07-17 NOTE — Addendum Note (Signed)
Addended by: Rogers Seeds on: 07/17/2023 02:53 PM   Modules accepted: Orders

## 2023-07-30 ENCOUNTER — Ambulatory Visit: Payer: 59 | Admitting: Orthopedic Surgery

## 2023-08-03 ENCOUNTER — Encounter: Payer: 59 | Admitting: Student

## 2023-08-04 ENCOUNTER — Telehealth: Payer: Self-pay | Admitting: Cardiology

## 2023-08-04 ENCOUNTER — Ambulatory Visit: Payer: 59

## 2023-08-04 NOTE — Telephone Encounter (Signed)
 Discussed with Dr. Elberta Fortis.   Pt rescheduled for RN visit next Friday.  Pt agreeable to plan.

## 2023-08-04 NOTE — Telephone Encounter (Signed)
 Pt requesting cb to reschedule nurse visit due to being sick and not able to make it

## 2023-08-05 ENCOUNTER — Ambulatory Visit: Payer: 59 | Admitting: Orthopedic Surgery

## 2023-08-07 ENCOUNTER — Encounter: Payer: Self-pay | Admitting: Radiology

## 2023-08-10 ENCOUNTER — Other Ambulatory Visit: Payer: Self-pay | Admitting: Student

## 2023-08-10 ENCOUNTER — Encounter: Payer: 59 | Admitting: Orthopedic Surgery

## 2023-08-10 DIAGNOSIS — M810 Age-related osteoporosis without current pathological fracture: Secondary | ICD-10-CM

## 2023-08-13 ENCOUNTER — Telehealth: Payer: Self-pay | Admitting: Orthopedic Surgery

## 2023-08-13 NOTE — Telephone Encounter (Signed)
Pt called requesting a call back from [facility]. Pt thought we were closed and did not call and has a no show. Pt asking to be seen sooner than 3/5 Pt phone number is [phone].

## 2023-08-14 ENCOUNTER — Ambulatory Visit: Payer: 59 | Attending: Cardiology | Admitting: *Deleted

## 2023-08-14 VITALS — HR 79 | Ht 65.5 in

## 2023-08-14 DIAGNOSIS — I493 Ventricular premature depolarization: Secondary | ICD-10-CM | POA: Diagnosis not present

## 2023-08-14 DIAGNOSIS — Z79899 Other long term (current) drug therapy: Secondary | ICD-10-CM | POA: Diagnosis not present

## 2023-08-14 NOTE — Progress Notes (Signed)
   Nurse Visit   Date of Encounter: 08/14/2023 ID: FLOELLA ENSZ, DOB 02/04/1951, MRN [id number]  PCP:  Faith Rogue, DO   [facility] Providers Cardiologist:  None Electrophysiologist:  Will [name], MD      Visit Details   VS:  Ht 5' 5.5" (1.664 m)   BMI 25.07 kg/m  , BMI Body mass index is 25.07 kg/m.  Wt Readings from Last 3 Encounters:  07/17/23 153 lb (69.4 kg)  07/16/23 153 lb 9.6 oz (69.7 kg)  06/02/23 154 lb (69.9 kg)     Reason for visit: Flecainide start Performed today: Vitals, EKG, and Provider consulted:Dr. Excell Seltzer Changes (medications, testing, etc.) : n/a Length of Visit: 15 minutes    Medications Adjustments/Labs and Tests Ordered: Orders Placed This Encounter  Procedures   EKG 12-Lead   No orders of the defined types were placed in this encounter.    Signed, Baird Lyons, RN  08/14/2023 4:08 PM

## 2023-08-14 NOTE — Telephone Encounter (Signed)
Communicating via mychart with patient

## 2023-08-17 ENCOUNTER — Encounter: Payer: Self-pay | Admitting: Internal Medicine

## 2023-08-17 ENCOUNTER — Ambulatory Visit (INDEPENDENT_AMBULATORY_CARE_PROVIDER_SITE_OTHER): Payer: 59 | Admitting: Internal Medicine

## 2023-08-17 VITALS — BP 141/85 | HR 78 | Ht 65.0 in | Wt 151.6 lb

## 2023-08-17 DIAGNOSIS — I2510 Atherosclerotic heart disease of native coronary artery without angina pectoris: Secondary | ICD-10-CM

## 2023-08-17 DIAGNOSIS — R062 Wheezing: Secondary | ICD-10-CM | POA: Diagnosis not present

## 2023-08-17 DIAGNOSIS — Z825 Family history of asthma and other chronic lower respiratory diseases: Secondary | ICD-10-CM

## 2023-08-17 DIAGNOSIS — Z9189 Other specified personal risk factors, not elsewhere classified: Secondary | ICD-10-CM | POA: Diagnosis not present

## 2023-08-17 DIAGNOSIS — R059 Cough, unspecified: Secondary | ICD-10-CM

## 2023-08-17 MED ORDER — ALBUTEROL SULFATE HFA 108 (90 BASE) MCG/ACT IN AERS: 2.0000 | INHALATION_SPRAY | Freq: Four times a day (QID) | RESPIRATORY_TRACT | 2 refills | Status: AC | PRN

## 2023-08-17 NOTE — Progress Notes (Signed)
OV 08/17/2023  Subjective:  Patient ID: Sheri [name], female , DOB: 1950-11-04 , age 73 y.o. , MRN: [id number] , ADDRESS: [address] [place] [address] PCP Faith Rogue, DO Patient Care Team: Faith Rogue, DO as PCP - General (Internal Medicine) Regan Lemming, MD as PCP - Electrophysiology (Cardiology)  This Provider for this visit: Treatment Team:  Attending Provider: Kalman Shan, MD    08/17/2023 -   Chief Complaint  Patient presents with   Consult    CT 12/31/22 pt states she does not have any concerns. No longer using inhaler      HPI Sheri [name] 73 y.o. -new consult.  Referred by Dr. Ines Bloomer who is no longer with internal medicine teaching practice.  She is 1 of 8 kids.  One of the children have COPD her parents never smoked.  She is a retired Midwife.  She is lived with a significant other Robert's no was renal smoker for many years.  But Mr. Izzo has recently quit smoking.  She herself is a non-smoker.  She has had intermittent cough and wheezing for the last few months.  It got worse with cigarette smoke exposure but after significant other quit smoking it is improved significantly that extremely mild symptoms of wheezing is present at this point.  A CT scan of the chest last summer showed emphysema and therefore she has been referred here.  On and off she will have some intermittent cough or wheezing or shortness of breath.  It is very sporadic.  Currently she feels really good.  She is very surprised that she has emphysema.  I personally visualized the CT scan of the chest.  There is very mild emphysema in the upper lobes but otherwise largely CT scan from last summer is negative.  It is very underwhelming in my personal visualization.  She is currently largely asymptomatic.  Therefore we discussed in took a shared decision making for more expectant line of approach.  She agreed to get the alpha-1 antitrypsin phenotype check.  And also  get a pulmonary function test checked in the next few months.  Of note she did try Incruse but it did not work well for her.  She is noted to be on beta-1 nonspecific beta-blocker propranolol.    CT Chest data from date: June 2024 - personally visualized and independently interpreted : yes - my findings are: agree with below but challenging to appreciate the emphysema  IMPRESSION: 1. No acute process. 2. Mild centrilobular emphysema. 3. Coronary artery calcifications. 4. Aortic atherosclerosis with aneurysmal dilatation of the ascending aorta measuring 4.1 cm. Recommend annual imaging followup by CTA or MRA. This recommendation follows 2010 ACCF/AHA/AATS/ACR/ASA/SCA/SCAI/SIR/STS/SVM Guidelines for the Diagnosis and Management of Patients with Thoracic Aortic Disease. Circulation. 2010; 121: S063-K160. Aortic aneurysm NOS (ICD10-I71.9) 5. Mild compression deformity in the superior endplate at T3, new from 2014, however indeterminate in age.     Electronically Signed   By: Thornell Sartorius M.D.   On: 01/07/2023 04:31    PFT      No data to display            LAB RESULTS last 96 hours No results found.  LAB RESULTS last 90 days No results found for this or any previous visit (from the past 2160 hours).    IMPRESSION: 1. No acute process. 2. Mild centrilobular emphysema. 3. Coronary artery calcifications. 4. Aortic atherosclerosis with aneurysmal dilatation of the ascending aorta  measuring 4.1 cm. Recommend annual imaging followup by CTA or MRA. This recommendation follows 2010 ACCF/AHA/AATS/ACR/ASA/SCA/SCAI/SIR/STS/SVM Guidelines for the Diagnosis and Management of Patients with Thoracic Aortic Disease. Circulation. 2010; 121: U981-X914. Aortic aneurysm NOS (ICD10-I71.9) 5. Mild compression deformity in the superior endplate at T3, new from 2014, however indeterminate in age.     Electronically Signed   By: Thornell Sartorius M.D.   On: 01/07/2023 04:31      has  a past medical history of AF (atrial fibrillation) (HCC) (10/12/2015), Allergic rhinitis, Anxiety, ANXIETY DISORDER, GENERALIZED (08/18/2006), Arthritis, Atrial fibrillation with RVR (HCC), Cancer (HCC), Chiari I malformation (HCC) (03/07/2016), Chronic sinusitis, Congenital asymmetry of extremities (02/01/2019), COPD (chronic obstructive pulmonary disease) (HCC), Depression, GERD (gastroesophageal reflux disease), Goiter, Hx of atrial tachycardia since age 21 (03/15/2013), Memory loss (12/31/2016), Mnire's disease (03/07/2016), Palpitations, Panic attacks, Poisoning by selective serotonin reuptake inhibitors(969.03) (Jan 2007), Postmenopausal (12/31/2016), SVT (supraventricular tachycardia) (HCC) (03/15/2013), Swollen gland (09/14/2018), Tachycardia, and Tonsillitis (11/12/2018).   reports that she has never smoked. She has been exposed to tobacco smoke. She has never used smokeless tobacco.  Past Surgical History:  Procedure Laterality Date   APPENDECTOMY     COLONOSCOPY     ELECTROPHYSIOLOGIC STUDY N/A 10/12/2015   Procedure: Atrial Fibrillation Ablation;  Surgeon: Will [name], MD;  [facility];  Service: Cardiovascular;  Laterality: N/A;   LID LESION EXCISION Left 08/06/2017   Procedure: EXCISION BIOPSY OF EYELID, RECONSTRUCTION OF EYELID;  Surgeon: Floydene Flock, MD;  Location: [facility];  Service: Ophthalmology;  Laterality: Left;   OVARIAN CYST REMOVAL  09/1967   left ovarian cystectomy- 17.5 lb cyst removed- per pt   right mastoidectomy     ET tube placement in 1980 and 1990   right sinus surgery  october 2000   SVT ABLATION N/A 07/25/2020   Procedure: SVT ABLATION;  Surgeon: Regan Lemming, MD;  [facility];  Service: Cardiovascular;  Laterality: N/A;   SVT ABLATION N/A 10/26/2020   Procedure: SVT ABLATION;  Surgeon: Regan Lemming, MD;  [facility];  Service: Cardiovascular;  Laterality: N/A;   THYROIDECTOMY, PARTIAL     nodule  removal - April 1996    Allergies  Allergen Reactions   Guaifenesin Palpitations    Immunization History  Administered Date(s) Administered   Fluad Trivalent(High Dose 65+) 05/08/2023   Influenza Split 09/08/2012   Influenza,inj,Quad PF,6+ Mos 04/14/2013, 04/14/2016, 03/29/2021   Influenza-Unspecified 05/21/2018   PNEUMOCOCCAL CONJUGATE-20 11/25/2022   Pneumococcal Conjugate-13 04/14/2016    Family History  Problem Relation Age of Onset   Hypertension Mother    Hypertension Father    Psoriasis Father    Heart attack Father    Atrial fibrillation Sister        had a successful a fib ablation   Atrial fibrillation Sister        had a successful a fib ablation   Breast cancer Niece 69   Colon cancer Neg Hx    Esophageal cancer Neg Hx    Stomach cancer Neg Hx    Rectal cancer Neg Hx    Colon polyps Neg Hx      Current Outpatient Medications:    alendronate (FOSAMAX) 70 MG tablet, TAKE 1 TABLET BY MOUTH ONCE A WEEK ON AN EMPTY STOMACH WITH A FULL GLASS OF WATER, Disp: 4 tablet, Rfl: 0   flecainide (TAMBOCOR) 50 MG tablet, Take 1 tablet (50 mg total) by mouth 2 (two) times  daily., Disp: 180 tablet, Rfl: 3   fluticasone (FLONASE) 50 MCG/ACT nasal spray, Use 2 spray(s) in each nostril once daily, Disp: 16 g, Rfl: 0   HYDROcodone-acetaminophen (NORCO/VICODIN) 5-325 MG tablet, Take 1 tablet by mouth every 6 (six) hours as needed., Disp: 8 tablet, Rfl: 0   meclizine (ANTIVERT) 25 MG tablet, Take 1 tablet (25 mg total) by mouth 3 (three) times daily as needed for dizziness., Disp: 60 tablet, Rfl: 3   pravastatin (PRAVACHOL) 20 MG tablet, Take 1 tablet (20 mg total) by mouth daily., Disp: 90 tablet, Rfl: 3   propranolol (INDERAL) 20 MG tablet, Take 1 tablet (20 mg total) by mouth 2 (two) times daily., Disp: 180 tablet, Rfl: 3   QUEtiapine (SEROQUEL) 200 MG tablet, Take 100 mg by mouth at bedtime., Disp: , Rfl:    traZODone (DESYREL) 100 MG tablet, Take 100 mg by mouth at bedtime.,  Disp: , Rfl:    venlafaxine (EFFEXOR) 75 MG tablet, Take by mouth., Disp: , Rfl:    venlafaxine XR (EFFEXOR-XR) 75 MG 24 hr capsule, Take 150 mg by mouth daily. Taking 225mg  total per day., Disp: , Rfl:    albuterol (VENTOLIN HFA) 108 (90 Base) MCG/ACT inhaler, Inhale 2 puffs into the lungs every 6 (six) hours as needed for wheezing or shortness of breath., Disp: 8 g, Rfl: 2   umeclidinium bromide (INCRUSE ELLIPTA) 62.5 MCG/ACT AEPB, Inhale 1 puff into the lungs daily. (Patient not taking: Reported on 08/17/2023), Disp: 30 each, Rfl: 11      Objective:   Vitals:   08/17/23 1123  BP: (!) 141/85  Pulse: 78  SpO2: 96%  Weight: 151 lb 9.6 oz (68.8 kg)  Height: 5\' 5"  (1.651 m)    Estimated body mass index is 25.23 kg/m as calculated from the following:   Height as of this encounter: 5\' 5"  (1.651 m).   Weight as of this encounter: 151 lb 9.6 oz (68.8 kg).  @WEIGHTCHANGE @  [employer]   08/17/23 1123  Weight: 151 lb 9.6 oz (68.8 kg)     Physical Exam   General: No distress. Looks well O2 at rest: no Cane present: no Sitting in wheel chair: no Frail: no Obese: no Neuro: Alert and Oriented x 3. GCS 15. Speech normal Psych: Pleasant Resp:  Barrel Chest - no.  Wheeze - no, Crackles - no, No overt respiratory distress CVS: Normal heart sounds. Murmurs - no Ext: Stigmata of Connective Tissue Disease - no HEENT: Normal upper airway. PEERL +. No post nasal drip        Assessment:       ICD-10-CM   1. Family history of emphysema  Z82.5 Alpha-1 antitrypsin phenotype    Pulmonary function test    2. At risk from passive smoking  Z91.89 Alpha-1 antitrypsin phenotype    Pulmonary function test    3. Cough, unspecified type  R05.9 Alpha-1 antitrypsin phenotype    Pulmonary function test    4. Wheeze  R06.2 Alpha-1 antitrypsin phenotype    Pulmonary function test    5. Coronary artery calcification seen on CT scan  I25.10 Alpha-1 antitrypsin phenotype    Pulmonary  function test         Plan:     Patient Instructions     ICD-10-CM   1. Family history of emphysema  Z82.5     2. At risk from passive smoking  Z91.89     3. Cough, unspecified type  R05.9  4. Wheeze  R06.2     5. Coronary artery calcification seen on CT scan  I25.10      I think the cigarette smoke from passive exposure from his significant other probably contributed to her symptoms and I am so glad that you are significantly better after he quit smoking. I think is a very small amount of emphysema that I can see on his CT scan of the chest.  Plan  - Do blood work for alpha-1 antitrypsin phenotype -Do full pulmonary function test in the next few months -Clinically monitor with albuterol as needed  -Okay to hold off on Incruse  -Please note that propranolol can make asthma or COPD worse and if your primary care physician or cardiologist can put you on another beta-1 blocker that would be better  Follow-up - Return to see nurse practitioner to review your blood work and breathing test in a few months   FOLLOWUP No follow-ups on file.    SIGNATURE    Dr. Kalman Shan, M.D., F.C.C.P,  Pulmonary and Critical Care Medicine Staff Physician, [facility] Director - Interstitial Lung Disease  Program  Pulmonary Fibrosis [facility] [place], [place], [address]  Pager: [phone], If no answer or between  15:00h - 7:00h: call [phone] Telephone: [phone]  11:52 AM 08/17/2023

## 2023-08-17 NOTE — Patient Instructions (Addendum)
ICD-10-CM   1. Family history of emphysema  Z82.5     2. At risk from passive smoking  Z91.89     3. Cough, unspecified type  R05.9     4. Wheeze  R06.2     5. Coronary artery calcification seen on CT scan  I25.10      I think the cigarette smoke from passive exposure from his significant other probably contributed to her symptoms and I am so glad that you are significantly better after he quit smoking. I think is a very small amount of emphysema that I can see on his CT scan of the chest.  Plan  - Do blood work for alpha-1 antitrypsin phenotype -Do full pulmonary function test in the next few months -Clinically monitor with albuterol as needed  -Okay to hold off on Incruse  -Please note that propranolol can make asthma or COPD worse and if your primary care physician or cardiologist can put you on another beta-1 blocker that would be better  Follow-up - Return to see nurse practitioner to review your blood work and breathing test in a few months

## 2023-08-23 NOTE — Progress Notes (Unsigned)
CC: Dizziness and off balance  HPI:  [name] is a 73 y.o. female with a history of SVT, hemorrhoids, meniere's diease, BPPV, depression who presents to the clinic with concerns of feeling off balance and dizziness.   Meds: Osteoporosis: Alendronate 70 mg weekly SVT: Flecainide 50 mg BID, propranolol 20 mg BID BPPV: Meclizine 25 mg TID PRN HLD: pravastatin 20 mg daily  Depression/Mood: Venlafaxine 150 mg daily, seroquel 100 mg daily Insomnia: Trazadone 100 nightly  Emphysema: albuterol  Past Medical History:  Diagnosis Date   AF (atrial fibrillation) (HCC) 10/12/2015   Allergic rhinitis    Anxiety    ANXIETY DISORDER, GENERALIZED 08/18/2006   Qualifier: Diagnosis of  By: Sherlon Handing MD, Larene Pickett     Arthritis    no per pt   Atrial fibrillation with RVR (HCC)    Cancer (HCC)    left lower eyelid, basal cell cancer   Chiari I malformation (HCC) 03/07/2016   pt unsure of this   Chronic sinusitis    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   COPD (chronic obstructive pulmonary disease) (HCC)    no per pt   Depression    treated at [facility]   GERD (gastroesophageal reflux disease)    Goiter    hx of goiter   Hx of atrial tachycardia since age 64 03/15/2013   Memory loss 12/31/2016   Mnire's disease 03/07/2016   Palpitations    Associated with difficulty breathing   Panic attacks    Poisoning by selective serotonin reuptake inhibitors(969.03) Jan 2007   severe reaction with MAOI (nardil) and dextromethorphan   Postmenopausal 12/31/2016   SVT (supraventricular tachycardia) (HCC) 03/15/2013   History of SVT s/p afib ablation, 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 Following with cardiology.    Swollen gland 09/14/2018   Tachycardia    Tonsillitis 11/12/2018     Current Outpatient Medications:    albuterol (VENTOLIN HFA) 108 (90 Base) MCG/ACT inhaler, Inhale 2 puffs into the lungs every 6 (six) hours as needed for wheezing or shortness of breath.,  Disp: 8 g, Rfl: 2   alendronate (FOSAMAX) 70 MG tablet, TAKE 1 TABLET BY MOUTH ONCE A WEEK ON AN EMPTY STOMACH WITH A FULL GLASS OF WATER, Disp: 4 tablet, Rfl: 0   flecainide (TAMBOCOR) 50 MG tablet, Take 1 tablet (50 mg total) by mouth 2 (two) times daily., Disp: 180 tablet, Rfl: 3   fluticasone (FLONASE) 50 MCG/ACT nasal spray, Use 2 spray(s) in each nostril once daily, Disp: 16 g, Rfl: 0   HYDROcodone-acetaminophen (NORCO/VICODIN) 5-325 MG tablet, Take 1 tablet by mouth every 6 (six) hours as needed., Disp: 8 tablet, Rfl: 0   meclizine (ANTIVERT) 25 MG tablet, Take 1 tablet (25 mg total) by mouth 3 (three) times daily as needed for dizziness., Disp: 60 tablet, Rfl: 3   pravastatin (PRAVACHOL) 20 MG tablet, Take 1 tablet (20 mg total) by mouth daily., Disp: 90 tablet, Rfl: 3   propranolol (INDERAL) 20 MG tablet, Take 1 tablet (20 mg total) by mouth 2 (two) times daily., Disp: 180 tablet, Rfl: 3   QUEtiapine (SEROQUEL) 200 MG tablet, Take 100 mg by mouth at bedtime., Disp: , Rfl:    traZODone (DESYREL) 100 MG tablet, Take 100 mg by mouth at bedtime., Disp: , Rfl:    umeclidinium bromide (INCRUSE ELLIPTA) 62.5 MCG/ACT AEPB, Inhale 1 puff into the lungs daily. (Patient not taking: Reported on 08/17/2023), Disp: 30 each, Rfl: 11   venlafaxine (EFFEXOR) 75  MG tablet, Take by mouth., Disp: , Rfl:    venlafaxine XR (EFFEXOR-XR) 75 MG 24 hr capsule, Take 150 mg by mouth daily. Taking 225mg  total per day., Disp: , Rfl:   Review of Systems:    Neuro: Patient endorses dizziness  Physical Exam:  Vitals:   08/24/23 0935  BP: 111/65  Pulse: 74  Temp: 98.2 F (36.8 C)  TempSrc: Oral  SpO2: 97%  Weight: 154 lb (69.9 kg)  Height: 5\' 5"  (1.651 m)   General: Patient is sitting comfortably in the room  Eyes: Pupils equal and reactive to light, EOM intact  Cardio: Regular rate and rhythm, no murmurs, rubs or gallops. Pulmonary: Clear to ausculation bilaterally with no rales, rhonchi, and crackles   Neuro: Alert and orientated x3. CN II-XII intact. Sensation intact to upper and lower extremities.  5/5 motor strength to bilateral upper and lower extremities.  Test of skew negative.  Head impulse test negative.   Assessment & Plan:   Mnire's disease Patient has a past medical history of Mnire's disease.  She has been followed by ENT in the past and has had multiple procedures with ENT.  Given patient has worsening dizziness, will refer back to ENT.  She reports having tinnitus, but not worse than usual.  Plan: -Refer to ENT today  BPV (benign positional vertigo) Patient has a past medical history of Mnire's disease as well as BPPV.  She states over the past week she has developed dizziness.  She states these are episodic dizziness episodes that last about a minute that get worse with any movement of her head.  She states she has tried meclizine with minimal relief.  She states the only thing that helps her is when she does not move.  She states her episodes last about 1 minute and then goes away.  She describes them as the room spinning.  She denies any falls.  She denies any dysphagia, trouble speaking, trouble with vision, sensory deficits, or motor deficits.  She has had no trouble with walking.  My exam is negative for concern for central process.  Plan: -Likely is BPPV, will refer for vestibular rehab -Refilled meclizine -Epley maneuver handout given  Encounter for screening mammogram for malignant neoplasm of breast Patient referred for mammogram.  Patient discussed with Dr.  Dickie La  Modena Slater, DO PGY-2 Internal Medicine Resident  Pager: [phone]

## 2023-08-24 ENCOUNTER — Encounter: Payer: Self-pay | Admitting: Student

## 2023-08-24 ENCOUNTER — Ambulatory Visit (INDEPENDENT_AMBULATORY_CARE_PROVIDER_SITE_OTHER): Payer: 59 | Admitting: Student

## 2023-08-24 VITALS — BP 111/65 | HR 74 | Temp 98.2°F | Ht 65.0 in | Wt 154.0 lb

## 2023-08-24 DIAGNOSIS — H8110 Benign paroxysmal vertigo, unspecified ear: Secondary | ICD-10-CM | POA: Diagnosis not present

## 2023-08-24 DIAGNOSIS — H8109 Meniere's disease, unspecified ear: Secondary | ICD-10-CM | POA: Diagnosis not present

## 2023-08-24 DIAGNOSIS — Z1231 Encounter for screening mammogram for malignant neoplasm of breast: Secondary | ICD-10-CM | POA: Insufficient documentation

## 2023-08-24 MED ORDER — MECLIZINE HCL 25 MG PO TABS: 25.0000 mg | ORAL_TABLET | Freq: Three times a day (TID) | ORAL | 3 refills | Status: DC | PRN

## 2023-08-24 NOTE — Assessment & Plan Note (Addendum)
Patient has a past medical history of Mnire's disease as well as BPPV.  She states over the past week she has developed dizziness.  She states these are episodic dizziness episodes that last about a minute that get worse with any movement of her head.  She states she has tried meclizine with minimal relief.  She states the only thing that helps her is when she does not move.  She states her episodes last about 1 minute and then goes away.  She describes them as the room spinning.  She denies any falls.  She denies any dysphagia, trouble speaking, trouble with vision, sensory deficits, or motor deficits.  She has had no trouble with walking.  My exam is negative for concern for central process.  Plan: -Likely is BPPV, will refer for vestibular rehab -Refilled meclizine -Epley maneuver handout given

## 2023-08-24 NOTE — Patient Instructions (Signed)
Sheri [name],Thank you for allowing me to take part in your care today.  Here are your instructions.  1. I have referred you to ENT and vestibular rehab  2.  Have attached some readings to this.  3.  Vestibular rehab and ENT will call you.  4.  I have refilled your meclizine.  5.  Please come back in 1 month for follow-up.  Thank you, Dr. Allena Katz  If you have any other questions please contact the internal medicine clinic at [phone] If it is after hours, please call the [facility] at [phone] and then ask the person who picks up for the resident on call.

## 2023-08-24 NOTE — Assessment & Plan Note (Signed)
-  Patient referred for mammogram

## 2023-08-24 NOTE — Assessment & Plan Note (Addendum)
Patient has a past medical history of Mnire's disease.  She has been followed by ENT in the past and has had multiple procedures with ENT.  Given patient has worsening dizziness, will refer back to ENT.  She reports having tinnitus, but not worse than usual.  Plan: -Refer to ENT today

## 2023-08-26 LAB — ALPHA-1 ANTITRYPSIN PHENOTYPE: A-1 Antitrypsin, Ser: 162 mg/dL (ref 83–199)

## 2023-08-28 ENCOUNTER — Institutional Professional Consult (permissible substitution): Payer: 59 | Admitting: Internal Medicine

## 2023-08-28 ENCOUNTER — Ambulatory Visit: Payer: 59 | Admitting: Orthopaedic Surgery

## 2023-09-01 ENCOUNTER — Encounter: Payer: 59 | Admitting: Orthopedic Surgery

## 2023-09-03 ENCOUNTER — Encounter: Payer: Self-pay | Admitting: Cardiology

## 2023-09-03 ENCOUNTER — Ambulatory Visit: Payer: 59 | Attending: Cardiology | Admitting: Cardiology

## 2023-09-03 VITALS — BP 118/70 | HR 70 | Ht 65.0 in | Wt 155.0 lb

## 2023-09-03 DIAGNOSIS — I480 Paroxysmal atrial fibrillation: Secondary | ICD-10-CM | POA: Diagnosis not present

## 2023-09-03 DIAGNOSIS — I471 Supraventricular tachycardia, unspecified: Secondary | ICD-10-CM

## 2023-09-03 NOTE — Patient Instructions (Addendum)
Medication Instructions:  Your physician has recommended you make the following change in your medication:  INCREASE Flecainide to 100 mg twice daily  *If you need a refill on your cardiac medications before your next appointment, please call your pharmacy*   Lab Work: None ordered  Testing/Procedures: None ordered   Follow-Up: At [facility], you and your health needs are our priority.  As part of our continuing mission to provide you with exceptional heart care, we have created designated Provider Care Teams.  These Care Teams include your primary Cardiologist (physician) and Advanced Practice Providers (APPs -  Physician Assistants and Nurse Practitioners) who all work together to provide you with the care you need, when you need it.   Your next appointment:   3 month(s)  The format for your next appointment:   In Person  Provider:   You will follow up in the Atrial Fibrillation Clinic located at [facility]. Your provider will be: Clint R. Fenton, PA-C or Lake Bells, PA-C    Thank you for choosing [facility]!!   Dory Horn, RN [phone]

## 2023-09-03 NOTE — Progress Notes (Signed)
  Electrophysiology Office Note:   Date:  09/03/2023  ID:  Sheri [name], DOB [birth year], MRN [id number]  Primary Cardiologist: None Primary Heart Failure: None Electrophysiologist: Deaunte Dente [name], MD      History of Present Illness:   Sheri [name] is a 73 y.o. female with h/o atrial fibrillation, SVT seen today for routine electrophysiology followup.   Since last being seen in our clinic the patient reports doing decent.  She is continuing to have palpitations.  She was started on flecainide, and she did well initially, but over the last few days, palpitations have returned.  She has no chest pain or shortness of breath.  Palpitations last for few minutes and occur multiple times a day.  There are no exacerbating factors.  When she takes her medications, this usually helps, but she has not taken medications in the middle of the day.  she denies chest pain, palpitations, dyspnea, PND, orthopnea, nausea, vomiting, dizziness, syncope, edema, weight gain, or early satiety.   Review of systems complete and found to be negative unless listed in HPI.   EP Information / Studies Reviewed:    EKG is not ordered today. EKG from 08/14/23 reviewed which showed sinus rhythm        Risk Assessment/Calculations:    CHA2DS2-VASc Score = 2   This indicates a 2.2% annual risk of stroke. The patient's score is based upon: CHF History: 0 HTN History: 0 Diabetes History: 0 Stroke History: 0 Vascular Disease History: 0 Age Score: 1 Gender Score: 1              Physical Exam:   VS:  BP 118/70 (BP Location: Right Arm, Patient Position: Sitting, Cuff Size: Normal)   Pulse 70   Ht 5\' 5"  (1.651 m)   Wt 155 lb (70.3 kg)   SpO2 96%   BMI 25.79 kg/m    Wt Readings from Last 3 Encounters:  09/03/23 155 lb (70.3 kg)  08/24/23 154 lb (69.9 kg)  08/17/23 151 lb 9.6 oz (68.8 kg)     GEN: Well nourished, well developed in no acute distress NECK: No JVD; No carotid bruits CARDIAC: Regular rate and  rhythm, no murmurs, rubs, gallops RESPIRATORY:  Clear to auscultation without rales, wheezing or rhonchi  ABDOMEN: Soft, non-tender, non-distended EXTREMITIES:  No edema; No deformity   ASSESSMENT AND PLAN:    1.  Paroxysmal atrial fibrillation: Status post ablation 10/12/2015.  Not on anticoagulation.  No further atrial fibrillation noted.  2.  SVT: Has responded to adenosine.  Negative EP study x 2.  Currently on propranolol 20 mg daily, flecainide 50 mg twice daily.  He is continue to have palpitations.  Jamerson Vonbargen increase flecainide to 100 mg twice daily.  If she continues to be symptomatic, we Shakeria Robinette plan for 2-week monitor.  Follow up with EP APP in 3 months  Signed, Cadell Gabrielson [name], MD

## 2023-09-03 NOTE — Addendum Note (Signed)
Addended by: Baird Lyons on: 09/03/2023 05:02 PM   Modules accepted: Orders

## 2023-09-08 ENCOUNTER — Ambulatory Visit (INDEPENDENT_AMBULATORY_CARE_PROVIDER_SITE_OTHER): Payer: 59 | Admitting: Orthopaedic Surgery

## 2023-09-08 DIAGNOSIS — S52231A Displaced oblique fracture of shaft of right ulna, initial encounter for closed fracture: Secondary | ICD-10-CM | POA: Diagnosis not present

## 2023-09-08 DIAGNOSIS — S62617A Displaced fracture of proximal phalanx of left little finger, initial encounter for closed fracture: Secondary | ICD-10-CM | POA: Diagnosis not present
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
## 2023-09-09 ENCOUNTER — Other Ambulatory Visit: Payer: Self-pay | Admitting: Student

## 2023-09-09 DIAGNOSIS — M810 Age-related osteoporosis without current pathological fracture: Secondary | ICD-10-CM

## 2023-09-10 ENCOUNTER — Telehealth: Payer: Self-pay | Admitting: Orthopedic Surgery

## 2023-09-10 NOTE — Telephone Encounter (Signed)
Certified mail discharge letter from Dr. Christell Constant RETURNED/UNABLE TO Sheri [name].

## 2023-09-23 ENCOUNTER — Encounter: Payer: 59 | Admitting: Orthopedic Surgery

## 2023-10-24 ENCOUNTER — Other Ambulatory Visit: Payer: Self-pay | Admitting: Student

## 2023-10-24 DIAGNOSIS — M810 Age-related osteoporosis without current pathological fracture: Secondary | ICD-10-CM

## 2023-10-26 ENCOUNTER — Encounter: Admitting: Orthopedic Surgery

## 2023-10-26 DIAGNOSIS — H26492 Other secondary cataract, left eye: Secondary | ICD-10-CM | POA: Diagnosis not present

## 2023-10-28 ENCOUNTER — Ambulatory Visit (INDEPENDENT_AMBULATORY_CARE_PROVIDER_SITE_OTHER): Payer: Self-pay | Admitting: Student

## 2023-10-28 ENCOUNTER — Telehealth: Payer: Self-pay | Admitting: Orthopedic Surgery

## 2023-10-28 VITALS — BP 125/70 | HR 70 | Temp 98.4°F | Ht 65.0 in | Wt 156.6 lb

## 2023-10-28 DIAGNOSIS — H8109 Meniere's disease, unspecified ear: Secondary | ICD-10-CM

## 2023-10-28 DIAGNOSIS — K529 Noninfective gastroenteritis and colitis, unspecified: Secondary | ICD-10-CM

## 2023-10-28 DIAGNOSIS — M810 Age-related osteoporosis without current pathological fracture: Secondary | ICD-10-CM | POA: Diagnosis not present

## 2023-10-28 DIAGNOSIS — F4320 Adjustment disorder, unspecified: Secondary | ICD-10-CM

## 2023-10-28 DIAGNOSIS — R197 Diarrhea, unspecified: Secondary | ICD-10-CM | POA: Diagnosis not present

## 2023-10-28 DIAGNOSIS — F339 Major depressive disorder, recurrent, unspecified: Secondary | ICD-10-CM | POA: Diagnosis not present

## 2023-10-28 DIAGNOSIS — F332 Major depressive disorder, recurrent severe without psychotic features: Secondary | ICD-10-CM

## 2023-10-28 DIAGNOSIS — F329 Major depressive disorder, single episode, unspecified: Secondary | ICD-10-CM | POA: Insufficient documentation

## 2023-10-28 MED ORDER — MECLIZINE HCL 25 MG PO TABS
25.0000 mg | ORAL_TABLET | Freq: Three times a day (TID) | ORAL | 3 refills | Status: AC | PRN
Start: 2023-10-28 — End: ?

## 2023-10-28 MED ORDER — ALENDRONATE SODIUM 70 MG PO TABS: 70.0000 mg | ORAL_TABLET | ORAL | 12 refills | Status: AC

## 2023-10-28 NOTE — Telephone Encounter (Signed)
 Pt would like to be able to reschedule her appt if possible.
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
## 2023-10-28 NOTE — Patient Instructions (Signed)
 Thank you, Ms.[name] for allowing [place] to provide your care today. Today we discussed depression, grief reaction, and diarrhea.    I have ordered the following labs for you:   Lab Orders         TSH         CMP14 + Anion Gap         H. pylori breath test         CBC no Diff       Referrals ordered today:    Referral Orders         AMB Referral [facility] Care Management    Therapy   I have ordered the following medication/changed the following medications:   Stop the following medications: Medications Discontinued During This Encounter  Medication Reason   meclizine (ANTIVERT) 25 MG tablet Reorder   alendronate (FOSAMAX) 70 MG tablet Reorder     Start the following medications: Meds ordered this encounter  Medications   meclizine (ANTIVERT) 25 MG tablet    Sig: Take 1 tablet (25 mg total) by mouth 3 (three) times daily as needed for dizziness.    Dispense:  60 tablet    Refill:  3   alendronate (FOSAMAX) 70 MG tablet    Sig: Take 1 tablet (70 mg total) by mouth once a week. Take with a full glass of water on an empty stomach.    Dispense:  4 tablet    Refill:  12     Follow up: 2-4 weeks    Should you have any questions or concerns please call the internal medicine clinic at [phone].     Please note that our late policy has changed.  If you are more than 15 minutes late to your appointment, you may be asked to reschedule your appointment.  Dr. Hessie Diener, D.O. [facility]
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
## 2023-10-28 NOTE — Assessment & Plan Note (Signed)
 Patient presents today tearful.  Her husband passed away in 04/02/25and she is having a difficult time coping at this point.  She does have underlying MDD, but I do believe that her increase in the PHQ-9 score/depressed mood at this time is due to acute grief reaction.  She denies SI/HI.  She is currently seen by psychiatry and is on medications of trazodone 100 daily, Effexor 150 daily, Seroquel 100 daily. Plan: -Patient will contact her psychiatrist for medication adjustment -Referral sent to [facility]

## 2023-10-28 NOTE — Assessment & Plan Note (Addendum)
 Patient reports diarrhea that started about 2 to 3 months ago and has progressively worsened.  She reports that her diarrhea will improve if she takes Imodium, she would not have a bowel movement within 2 to 3 days after taking Imodium but then her diarrhea will return.  She denied blood in the stool, nausea, vomiting, abdominal pain.  She reports that this diarrhea is not daily and was initially worse with dairy in which she has tried to take Lactaid tablets which had helped some.  She is unsure if there is any other correlation of foods including greasy or foods such as fast food.  She also has a history of a partial thyroidectomy and what she does not take thyroid replacement therapy for. I currently have a low suspicion for biliary disease, patient denies abdominal pain or association with food. Higher suspicion for lactose intolerance vs functional given the recent life stressors, but will evaluate for other etiologies.  Plan: -Will obtain TSH, CMP, CBC, H. pylori antigen  -Asked patient to keep a diarrhea log, especially with diary/greasy food -Patient instructed to avoid dairy  -If work up is WNL and her diarrhea does not improve avoiding dairy, will broaden the work up with stool studies

## 2023-10-29 ENCOUNTER — Other Ambulatory Visit: Payer: Self-pay | Admitting: Licensed Clinical Social Worker

## 2023-10-29 ENCOUNTER — Telehealth: Payer: Self-pay | Admitting: *Deleted

## 2023-10-29 LAB — CBC
Hematocrit: 40 % (ref 34.0–46.6)
Hemoglobin: 13.4 g/dL (ref 11.1–15.9)
MCH: 30.2 pg (ref 26.6–33.0)
MCHC: 33.5 g/dL (ref 31.5–35.7)
MCV: 90 fL (ref 79–97)
Platelets: 298 10*3/uL (ref 150–450)
RBC: 4.43 x10E6/uL (ref 3.77–5.28)
RDW: 12 % (ref 11.7–15.4)
WBC: 8.6 10*3/uL (ref 3.4–10.8)

## 2023-10-29 LAB — CMP14 + ANION GAP
ALT: 11 IU/L (ref 0–32)
AST: 13 IU/L (ref 0–40)
Albumin: 3.9 g/dL (ref 3.8–4.8)
Alkaline Phosphatase: 77 IU/L (ref 44–121)
Anion Gap: 15 mmol/L (ref 10.0–18.0)
BUN/Creatinine Ratio: 13 (ref 12–28)
BUN: 9 mg/dL (ref 8–27)
Bilirubin Total: 0.4 mg/dL (ref 0.0–1.2)
CO2: 25 mmol/L (ref 20–29)
Calcium: 9.1 mg/dL (ref 8.7–10.3)
Chloride: 104 mmol/L (ref 96–106)
Creatinine, Ser: 0.69 mg/dL (ref 0.57–1.00)
Globulin, Total: 2.5 g/dL (ref 1.5–4.5)
Glucose: 105 mg/dL — ABNORMAL HIGH (ref 70–99)
Potassium: 4.1 mmol/L (ref 3.5–5.2)
Sodium: 144 mmol/L (ref 134–144)
Total Protein: 6.4 g/dL (ref 6.0–8.5)
eGFR: 92 mL/min/{1.73_m2} (ref >59)

## 2023-10-29 LAB — TSH: TSH: 3.47 u[IU]/mL (ref 0.450–4.500)

## 2023-10-29 LAB — H. PYLORI BREATH TEST: H pylori Breath Test: NEGATIVE

## 2023-10-29 NOTE — Telephone Encounter (Signed)
 Please see the message below in reference to getting this patient set up for an appointment.     Copied from CRM [id number]. Topic: Appointments - Appointment Info/Confirmation >> Oct 28, 2023  9:50 AM Antony Haste wrote: Patient/patient representative is calling for information regarding an appointment. >> Oct 28, 2023  4:56 PM Tiffany H wrote: Patient called to follow up on mental health referral. Advised that the referral is processing and someone will call her back within the next 24 hours. Please confirm receipt of referral and scheduled appointment and follow up with patient as soon as possible. Patient is going through the recent death of a spouse.

## 2023-10-29 NOTE — Progress Notes (Signed)
 Complex Care Management Note  Care Guide Note 10/29/2023 Name: Sheri [name] MRN: [id number] DOB: 1950/11/26  [name] is a 73 y.o. year old female who sees Faith Rogue, DO for primary care. I reached out to [name] by phone today to offer complex care management services.  [name] was given information about Complex Care Management services today including:   The Complex Care Management services include support from the care team which includes your Nurse Care Manager, Clinical Social Worker, or Pharmacist.  The Complex Care Management team is here to help remove barriers to the health concerns and goals most important to you. Complex Care Management services are voluntary, and the patient may decline or stop services at any time by request to their care team member.   Complex Care Management Consent Status: Patient agreed to services and verbal consent obtained.   Follow up plan:  Telephone appointment with complex care management team member scheduled for:  4/10  Encounter Outcome:  Patient Scheduled  Gwenevere Ghazi  [facility]  [facility], [facility] Guide  Direct Dial: [phone]  Fax [phone]

## 2023-10-29 NOTE — Patient Instructions (Signed)
 Visit Information  Thank you for taking time to visit with me today. Please don't hesitate to contact me if I can be of assistance to you before our next scheduled appointment.  Our next appointment is by telephone on 11/17/23 at 1PM Please call the care guide team at [phone] if you need to cancel or reschedule your appointment.   Following is a copy of your care plan:   Goals Addressed             This Visit's Progress    VBCI Social Work Care Plan       Problems:   Disease Management support and education needs related to Grief  CSW Clinical Goal(s):   Over the next 3 weeks the Patient will  connect with ongoing in person therapist .  Interventions:  Mental Health:  Evaluation of current treatment plan related to Grief Active listening / Reflection utilized Emotional Support Provided Participation in counseling encourage : Connect with MH provider for ongoing counseling Participation in support group encouraged : will send patient list of support groups in area Problem Solving /Task Center strategies reviewed  Patient Goals/Self-Care Activities:  Connect with provider for ongoing mental health treatment.   Review educational material on local grief support groups.  Plan:   Telephone follow up appointment with care management team member scheduled for:  4/29 -!PM        Please call the Suicide and Crisis Lifeline: 988 if you are experiencing a Mental Health or Behavioral Health Crisis or need someone to talk to.  Patient verbalizes understanding of instructions and care plan provided today and agrees to view in MyChart. Active MyChart status and patient understanding of how to access instructions and care plan via MyChart confirmed with patient.     Kenton Kingfisher, LCSW [place], [facility] Licensed Clinical Social Worker Care Coordinator [phone]

## 2023-10-29 NOTE — Patient Outreach (Signed)
 Complex Care Management   Visit Note  10/29/2023  Name:  Sheri [name] MRN: [id number] DOB: [birth year]  Situation: Referral received for Complex Care Management related to  Grief  I obtained verbal consent from patient.  Visit completed with patient  on the phone  Background:   Past Medical History:  Diagnosis Date   AF (atrial fibrillation) (HCC) 10/12/2015   Allergic rhinitis    Anxiety    ANXIETY DISORDER, GENERALIZED 08/18/2006   Qualifier: Diagnosis of  By: Sherlon Handing MD, Larene Pickett     Arthritis    no per pt   Atrial fibrillation with RVR (HCC)    Cancer (HCC)    left lower eyelid, basal cell cancer   Chiari I malformation (HCC) 03/07/2016   pt unsure of this   Chronic sinusitis    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   COPD (chronic obstructive pulmonary disease) (HCC)    no per pt   Depression    treated at [facility]   GERD (gastroesophageal reflux disease)    Goiter    hx of goiter   Hx of atrial tachycardia since age 82 03/15/2013   Memory loss 12/31/2016   Mnire's disease 03/07/2016   Palpitations    Associated with difficulty breathing   Panic attacks    Poisoning by selective serotonin reuptake inhibitors(969.03) Jan 2007   severe reaction with MAOI (nardil) and dextromethorphan   Postmenopausal 12/31/2016   SVT (supraventricular tachycardia) (HCC) 03/15/2013   History of SVT s/p afib ablation, 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 Following with cardiology.    Swollen gland 09/14/2018   Tachycardia    Tonsillitis 11/12/2018    Assessment: Patient Reported Symptoms:  Cognitive Alert and oriented to person, place, and time  Neurological Not assessed    HEENT Not assessed    Cardiovascular Not assessed    Respiratory Not assesed    Endocrine Not assessed    Gastrointestinal Not assessed    Genitourinary Not assessed    Integumentary Not assessed    Musculoskeletal Not assessed    Psychosocial Sadness - if selected complete  PHQ 2-9 (Agreed to be connected for in person counseling related to grief - husband passed away in 16-Oct-2023)     There were no vitals filed for this visit.  Medications Reviewed Today   Medications were not reviewed in this encounter     Recommendation:   Follow up with [facility] for in person ongoing counseling services. Review list of grief support groups to be sent by e-mail  Follow Up Plan:   Telephone follow up appointment date/time:  11/17/2023  Kenton Kingfisher, LCSW [place], [facility] Licensed Clinical Social Worker Care Coordinator [phone]

## 2023-10-29 NOTE — Telephone Encounter (Signed)
 Patient has had too many no shows with our schedule.  Per Dr. Fara Boros, no more appointments for this patient.

## 2023-10-30 ENCOUNTER — Encounter: Payer: Self-pay | Admitting: Student

## 2023-10-30 NOTE — Progress Notes (Signed)
 Internal Medicine Clinic Attending  Case discussed with the resident at the time of the visit.  We reviewed the resident's history and exam and pertinent patient test results.  I agree with the assessment, diagnosis, and plan of care documented in the resident's note.

## 2023-11-11 ENCOUNTER — Telehealth: Payer: Self-pay | Admitting: Orthopaedic Surgery

## 2023-11-11 NOTE — Telephone Encounter (Signed)
 Pt would like a referral to a different hand specialist at another orthopedic group. Pt states to please send her xrays along with the referral.    Pt request a call once done,

## 2023-11-11 NOTE — Telephone Encounter (Signed)
 I called patient and advised Dr. Murrel Arnt has retired. Explained that he had referred her to Dr. Marce Sensing, who she never saw, as she had multiple no shows and cancelations. I gave her the number that was listed in the letter Jenette Mitchell sent to her on behalf of Dr. Sulema Endo which states , "I would urge you to find another provider to attend to your medical care needs immediately. You may want to call the local medical society or [facility] Connect (referral service [phone]) for assistance in obtaining a new provider."     Patient expressed understanding. I did advise I would send her message along to get copies of her xrays on CD for her to pick up here at our office.

## 2023-11-12 NOTE — Telephone Encounter (Signed)
 CD is ready. Did you want it to go with something else or just put up front?

## 2023-11-12 NOTE — Telephone Encounter (Signed)
 Noted. When I spoke with patient, I explained CD would be put at front for her to pick up.

## 2023-11-12 NOTE — Telephone Encounter (Signed)
 CD is up front. I tried to call her and recording said this call can not be completed as dialed. Please hang up and try again.

## 2023-11-16 DIAGNOSIS — H26491 Other secondary cataract, right eye: Secondary | ICD-10-CM | POA: Diagnosis not present

## 2023-11-17 ENCOUNTER — Encounter: Payer: Self-pay | Admitting: Licensed Clinical Social Worker

## 2023-11-17 ENCOUNTER — Other Ambulatory Visit: Payer: Self-pay | Admitting: Licensed Clinical Social Worker

## 2023-12-28 ENCOUNTER — Ambulatory Visit: Payer: Self-pay

## 2023-12-28 NOTE — Telephone Encounter (Signed)
 Please schedule pt an appt; first available. Thanks

## 2023-12-28 NOTE — Telephone Encounter (Signed)
 Copied from CRM [id number]. Topic: Clinical - Red Word Triage >> Dec 28, 2023  3:44 PM Bearl Botts A wrote: Red Word that prompted transfer to Nurse Triage: Patient states that her kenitis has gotten worse it is loud. Mucus draining in her throat makes it hard to swallow. Reason for Disposition  Decreased hearing followed exposure to prolonged loud noise (e.g., concerts, headphones, work environment)    Loud high pitch sound constantly x several months and ongoing  [1] Sinus congestion (pressure, fullness) AND [2] present > 10 days    Routing information to office to request earlier appt  Answer Assessment - Initial Assessment Questions 1. DESCRIPTION: "Describe the sound you are hearing." (e.g., buzzing, hissing, humming, ringing)     Loud high pitch sound 2. LOCATION: "Is the sound in one or both ears?" If one, ask: "Which ear?"     Bilateral sound in ears but worse on right 3. SEVERITY: "How bad is it?"    - MILD: Doesn't interfere with normal activities, only can hear in a quiet room.    - MODERATE-SEVERE: interferes with work, school, sleep, or other activities.      Mild to moderate 4. ONSET: "When did this begin?" "Did it start suddenly or come on gradually?"     X couple of months and worsening 5. PATTERN: "Does this come and go, or has it been constant since it started?"     constant 6. HEARING LOSS: "Is your hearing decreased?" (e.g., normal, decreased)       Hearing loss possible in right eye 7. OTHER SYMPTOMS: "Do you have any other symptoms?" (e.g., dizziness, earache)     Dizziness related virtigo 8. PREGNANCY: "Is there any chance you are pregnant?" "When was your last menstrual period?"     N/a  Answer Assessment - Initial Assessment Questions 1. LOCATION: "Where does it hurt?"      Sinus pressure at times  2. ONSET: "When did the sinus pain start?"  (e.g., hours, days)      X months and worsening 3. SEVERITY: "How bad is the pain?"   (Scale 1-10; mild, moderate or  severe)   - MILD (1-3): doesn't interfere with normal activities    - MODERATE (4-7): interferes with normal activities (e.g., work or school) or awakens from sleep   - SEVERE (8-10): excruciating pain and patient unable to do any normal activities        Mild to moderate 4. RECURRENT SYMPTOM: "Have you ever had sinus problems before?" If Yes, ask: "When was the last time?" and "What happened that time?"      yes 5. NASAL CONGESTION: "Is the nose blocked?" If Yes, ask: "Can you open it or must you breathe through your mouth?"    At times  6. NASAL DISCHARGE: "Do you have discharge from your nose?" If so ask, "What color?"     clear 7. FEVER: "Do you have a fever?" If Yes, ask: "What is it, how was it measured, and when did it start?"      no 8. OTHER SYMPTOMS: "Do you have any other symptoms?" (e.g., sore throat, cough, earache, difficulty breathing)     Clear Drainage in back of throat constant  9. PREGNANCY: "Is there any chance you are pregnant?" "When was your last menstrual period?"     N/a  No appt available with PCP until 02/2024: and next available appt in office with any provider 01/04/2024: pt appt scheduled then and will send note to office for  possible earlier appt.  Protocols used: Tinnitus-A-AH, Sinus Pain or Congestion-A-AH

## 2023-12-28 NOTE — Telephone Encounter (Signed)
 Disregard previous message ; has an appt schedule 6/16 with Dr Duncan Gibson.

## 2024-01-04 ENCOUNTER — Ambulatory Visit: Payer: Self-pay | Admitting: Student

## 2024-01-06 ENCOUNTER — Ambulatory Visit: Payer: Self-pay | Admitting: Internal Medicine

## 2024-01-20 ENCOUNTER — Ambulatory Visit: Payer: Self-pay | Admitting: Student

## 2024-01-21 DIAGNOSIS — S6992XA Unspecified injury of left wrist, hand and finger(s), initial encounter: Secondary | ICD-10-CM | POA: Diagnosis not present

## 2024-01-26 ENCOUNTER — Ambulatory Visit: Payer: Self-pay | Admitting: Student

## 2024-01-26 NOTE — Progress Notes (Deleted)
   Acute Office Visit  Subjective:     Patient ID: MERRITT KIBBY, female    DOB: 14-Jan-1951, 73 y.o.   MRN: [id number]  No chief complaint on file.   HPI This is a 73 year old female past medical history of MDD, Mnire's disease, BPPV, chronic diarrhea, osteoporosis, prior history of SVT, insomnia presents today for tinnitus and sinus concerns.  ROS  Assessment and plan Objective:    There were no vitals taken for this visit. BP Readings from Last 3 Encounters:  10/28/23 125/70  09/03/23 118/70  08/24/23 111/65   Wt Readings from Last 3 Encounters:  10/28/23 156 lb 9.6 oz (71 kg)  09/03/23 155 lb (70.3 kg)  08/24/23 154 lb (69.9 kg)      Physical Exam  No results found for any visits on 01/26/24.      Assessment & Plan:  Has a PMHX of Minere's disease, BPPV and     Problem List Items Addressed This Visit   None   No orders of the defined types were placed in this encounter.   No follow-ups on file.  Toma Edwards, DO

## 2024-01-28 ENCOUNTER — Ambulatory Visit: Payer: Self-pay | Admitting: Student

## 2024-01-28 NOTE — Progress Notes (Deleted)
   Acute Office Visit  Subjective:     Patient ID: Sheri [name], female    DOB: 1950/11/12, 73 y.o.   MRN: [id number]  No chief complaint on file.   HPI This is a 72 year old female past medical history of MDD, Mnire's disease, BPPV, chronic diarrhea, osteoporosis, prior history of SVT, insomnia presents today for tinnitus and sinus concerns.  ROS  Assessment and plan Objective:    There were no vitals taken for this visit. BP Readings from Last 3 Encounters:  10/28/23 125/70  09/03/23 118/70  08/24/23 111/65   Wt Readings from Last 3 Encounters:  10/28/23 156 lb 9.6 oz (71 kg)  09/03/23 155 lb (70.3 kg)  08/24/23 154 lb (69.9 kg)      Physical Exam    Assessment & Plan:  Has a PMHX of Minere's disease, BPPV and     Problem List Items Addressed This Visit   None   No orders of the defined types were placed in this encounter.   No follow-ups on file.  Toma Edwards, DO

## 2024-02-01 ENCOUNTER — Ambulatory Visit: Payer: Self-pay | Admitting: Student

## 2024-02-02 ENCOUNTER — Ambulatory Visit: Payer: Self-pay | Admitting: Student

## 2024-02-03 ENCOUNTER — Other Ambulatory Visit: Payer: Self-pay | Admitting: Internal Medicine

## 2024-02-03 ENCOUNTER — Ambulatory Visit: Payer: Self-pay | Admitting: Student

## 2024-02-03 DIAGNOSIS — E7800 Pure hypercholesterolemia, unspecified: Secondary | ICD-10-CM

## 2024-02-04 ENCOUNTER — Ambulatory Visit: Payer: Self-pay | Admitting: Student

## 2024-02-08 ENCOUNTER — Ambulatory Visit: Payer: Self-pay | Admitting: Student

## 2024-02-09 ENCOUNTER — Ambulatory Visit: Payer: Self-pay

## 2024-02-09 NOTE — Progress Notes (Deleted)
 CC:   HPI:   [name] is a 73 y.o. female living with a history stated below and presents today for ***. Please see problem based assessment and plan for additional details.  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   COPD (chronic obstructive pulmonary disease) (HCC)    no per pt   Depression    treated at [facility]   GERD (gastroesophageal reflux disease)    Goiter    hx of goiter   Hx of atrial tachycardia since age 25 03/15/2013   Memory loss 12/31/2016   Mnire's disease 03/07/2016   Palpitations    Associated with difficulty breathing   Panic attacks    Poisoning by selective serotonin reuptake inhibitors(969.03) Jan 2007   severe reaction with MAOI (nardil) and dextromethorphan   Postmenopausal 12/31/2016   SVT (supraventricular tachycardia) (HCC) 03/15/2013   History of SVT s/p afib ablation, 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 Following with cardiology.    Swollen gland 09/14/2018   Tachycardia    Tonsillitis 11/12/2018    Current Outpatient Medications on File Prior to Visit  Medication Sig Dispense Refill   albuterol  (VENTOLIN  HFA) 108 (90 Base) MCG/ACT inhaler Inhale 2 puffs into the lungs every 6 (six) hours as needed for wheezing or shortness of breath. 8 g 2   alendronate  (FOSAMAX ) 70 MG tablet Take 1 tablet (70 mg total) by mouth once a week. Take with a full glass of water  on an empty stomach. 4 tablet 12   fluticasone  (FLONASE ) 50 MCG/ACT nasal spray Use 2 spray(s) in each nostril once daily 16 g 0    HYDROcodone -acetaminophen  (NORCO/VICODIN) 5-325 MG tablet Take 1 tablet by mouth every 6 (six) hours as needed. (Patient not taking: Reported on 09/03/2023) 8 tablet 0   meclizine  (ANTIVERT ) 25 MG tablet Take 1 tablet (25 mg total) by mouth 3 (three) times daily as needed for dizziness. 60 tablet 3   pravastatin  (PRAVACHOL ) 20 MG tablet Take 1 tablet by mouth once daily 30 tablet 0   propranolol  (INDERAL ) 20 MG tablet Take 1 tablet (20 mg total) by mouth 2 (two) times daily. 180 tablet 3   QUEtiapine  (SEROQUEL ) 200 MG tablet Take 100 mg by mouth at bedtime.     traZODone  (DESYREL ) 100 MG tablet Take 100 mg by mouth at bedtime.     umeclidinium bromide  (INCRUSE ELLIPTA ) 62.5 MCG/ACT AEPB Inhale 1 puff into the lungs daily. (Patient not taking: Reported on 09/03/2023) 30 each 11   venlafaxine  (EFFEXOR ) 75 MG tablet Take by mouth. (Patient not taking: Reported on 09/03/2023)     venlafaxine  XR (EFFEXOR -XR) 75 MG 24 hr capsule Take 150 mg by mouth daily. Taking 225mg  total per day.     No current facility-administered medications on file prior to visit.    Family History  Problem Relation Age of Onset   Hypertension Mother    Hypertension Father    Psoriasis Father    Heart attack  Father    Atrial fibrillation Sister        had a successful a fib ablation   Atrial fibrillation Sister        had a successful a fib ablation   Breast cancer Niece 42   Colon cancer Neg Hx    Esophageal cancer Neg Hx    Stomach cancer Neg Hx    Rectal cancer Neg Hx    Colon polyps Neg Hx     Social History   Socioeconomic History   Marital status: Widowed    Spouse name: Not on file   Number of children: Not on file   Years of education: Not on file   Highest education level: Not on file  Occupational History   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   Social Drivers of Corporate investment banker Strain: Low Risk  (05/21/2023)   Overall Financial Resource Strain (CARDIA)    Difficulty of Paying Living Expenses: Not very hard  Food Insecurity: Unknown (05/21/2023)   Hunger Vital Sign    Worried About Running Out of Food in the Last Year: Not on file    Ran Out of Food in the Last Year: Never true  Transportation Needs: No Transportation Needs (05/21/2023)   PRAPARE - Administrator, Civil Service (Medical): No    Lack of Transportation (Non-Medical): No  Physical Activity: Not on file  Stress: Not on file  Social Connections: Unknown (08/11/2022)   Social Connection and Isolation Panel    Frequency of Communication with Friends and Family: Three times a week    Frequency of Social Gatherings with Friends and Family: Once a week    Attends Religious Services: Never    Database administrator or Organizations: No    Attends Banker Meetings: Never    Marital Status: Patient declined  Catering manager Violence: Not At Risk (08/11/2022)   Humiliation, Afraid, Rape, and Kick questionnaire    Fear of Current or Ex-Partner: No    Emotionally Abused: No    Physically Abused: No    Sexually Abused: No    Review of Systems: ROS negative except for what is noted on the assessment and plan.  There were no vitals filed for this visit.  Physical Exam  Physical Exam: Constitutional: well-appearing in no acute distress HENT: normocephalic atraumatic, mucous membranes moist Eyes: conjunctiva non-erythematous Neck: supple Cardiovascular: regular rate and rhythm, no m/r/g Pulmonary/Chest: normal work of breathing on room air, lungs clear to auscultation  bilaterally Abdominal: soft, non-tender, non-distended MSK: *** Neurological: alert & oriented x 3, 5/5 strength in bilateral upper and lower extremities, normal gait Skin: warm and dry   Assessment & Plan:   No problem-specific Assessment & Plan notes found for this encounter.   Patient seen with Dr. {[id number]::Tpoopjfd,Z. Hoffman,Winfrey,Narendra,Chun,Chambliss,Lau,Machen}  Armando Rossetti M.D [facility] Internal Medicine, PGY-1 Phone: [phone] Date 02/09/2024 Time 1:48 PM

## 2024-02-10 ENCOUNTER — Ambulatory Visit: Payer: Self-pay | Admitting: Student

## 2024-02-12 ENCOUNTER — Other Ambulatory Visit: Payer: Self-pay

## 2024-02-12 ENCOUNTER — Ambulatory Visit: Admitting: Student

## 2024-02-12 ENCOUNTER — Telehealth: Payer: Self-pay | Admitting: *Deleted

## 2024-02-12 VITALS — BP 118/58 | HR 87 | Temp 98.2°F | Ht 65.0 in | Wt 157.0 lb

## 2024-02-12 DIAGNOSIS — R1310 Dysphagia, unspecified: Secondary | ICD-10-CM | POA: Diagnosis not present

## 2024-02-12 DIAGNOSIS — H9313 Tinnitus, bilateral: Secondary | ICD-10-CM | POA: Diagnosis not present

## 2024-02-12 DIAGNOSIS — R413 Other amnesia: Secondary | ICD-10-CM | POA: Diagnosis not present

## 2024-02-12 DIAGNOSIS — R6889 Other general symptoms and signs: Secondary | ICD-10-CM | POA: Insufficient documentation

## 2024-02-12 NOTE — Telephone Encounter (Signed)
 Mammogram appointment August 19.2025 @ 10:40 am / arrive 10:30 am breast center [phone]. Information regarding the $75.00 no show fee attached to mammogram reminder.   Appointment mailed to the patient.

## 2024-02-12 NOTE — Progress Notes (Signed)
 CC: Acute on chronic condition management  HPI:  [name] is a 73 y.o. female living with a history stated below and presents today for acute on chronic condition management. Please see problem based assessment and plan for additional details.  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   COPD (chronic obstructive pulmonary disease) (HCC)    no per pt   Depression    treated at [facility]   GERD (gastroesophageal reflux disease)    Goiter    hx of goiter   Hx of atrial tachycardia since age 84 03/15/2013   Memory loss 12/31/2016   Mnire's disease 03/07/2016   Palpitations    Associated with difficulty breathing   Panic attacks    Poisoning by selective serotonin reuptake inhibitors(969.03) Jan 2007   severe reaction with MAOI (nardil) and dextromethorphan   Postmenopausal 12/31/2016   SVT (supraventricular tachycardia) (HCC) 03/15/2013   History of SVT s/p afib ablation, 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 Following with cardiology.    Swollen gland 09/14/2018   Tachycardia    Tonsillitis 11/12/2018    Current Outpatient Medications on File Prior to Visit  Medication Sig Dispense Refill   albuterol  (VENTOLIN  HFA) 108 (90 Base) MCG/ACT inhaler Inhale 2 puffs into the lungs every 6 (six) hours as needed for wheezing or shortness of breath. 8 g 2   alendronate  (FOSAMAX ) 70 MG tablet Take 1 tablet (70 mg total) by mouth once a week. Take with a full glass of water  on an empty stomach. 4 tablet 12   fluticasone  (FLONASE ) 50 MCG/ACT nasal  spray Use 2 spray(s) in each nostril once daily 16 g 0   HYDROcodone -acetaminophen  (NORCO/VICODIN) 5-325 MG tablet Take 1 tablet by mouth every 6 (six) hours as needed. (Patient not taking: Reported on 09/03/2023) 8 tablet 0   meclizine  (ANTIVERT ) 25 MG tablet Take 1 tablet (25 mg total) by mouth 3 (three) times daily as needed for dizziness. 60 tablet 3   pravastatin  (PRAVACHOL ) 20 MG tablet Take 1 tablet by mouth once daily 30 tablet 0   propranolol  (INDERAL ) 20 MG tablet Take 1 tablet (20 mg total) by mouth 2 (two) times daily. 180 tablet 3   QUEtiapine  (SEROQUEL ) 200 MG tablet Take 100 mg by mouth at bedtime.     traZODone  (DESYREL ) 100 MG tablet Take 100 mg by mouth at bedtime.     umeclidinium bromide  (INCRUSE ELLIPTA ) 62.5 MCG/ACT AEPB Inhale 1 puff into the lungs daily. (Patient not taking: Reported on 09/03/2023) 30 each 11   venlafaxine  (EFFEXOR ) 75 MG tablet Take by mouth. (Patient not taking: Reported on 09/03/2023)     venlafaxine  XR (EFFEXOR -XR) 75 MG 24 hr capsule Take 150 mg by mouth daily. Taking 225mg  total per day.     No current facility-administered medications on file prior to visit.    Family History  Problem Relation Age of Onset   Hypertension Mother    Hypertension Father  Psoriasis Father    Heart attack Father    Atrial fibrillation Sister        had a successful a fib ablation   Atrial fibrillation Sister        had a successful a fib ablation   Breast cancer Niece 2   Colon cancer Neg Hx    Esophageal cancer Neg Hx    Stomach cancer Neg Hx    Rectal cancer Neg Hx    Colon polyps Neg Hx     Social History   Socioeconomic History   Marital status: Widowed    Spouse name: Not on file   Number of children: Not on file   Years of education: Not on file   Highest education level: Not on file  Occupational History   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   Social Drivers of Corporate investment banker Strain: Low Risk  (02/12/2024)   Overall Financial Resource Strain (CARDIA)    Difficulty of Paying Living Expenses: Not very hard  Food Insecurity: No Food Insecurity (02/12/2024)   Hunger Vital Sign    Worried About Running Out of Food in the Last Year: Never true    Ran Out of Food in the Last Year: Never true  Transportation Needs: No Transportation Needs (02/12/2024)   PRAPARE - Administrator, Civil Service (Medical): No    Lack of Transportation (Non-Medical): No  Physical Activity: Inactive (02/12/2024)   Exercise Vital Sign    Days of Exercise per Week: 0 days    Minutes of Exercise per Session: 0 min  Stress: Stress Concern Present (02/12/2024)   [employer] of Occupational Health - Occupational Stress Questionnaire    Feeling of Stress: Rather much  Social Connections: Moderately Isolated (02/12/2024)   Social Connection and Isolation Panel    Frequency of Communication with Friends and Family: More than three times a week    Frequency of Social Gatherings with Friends and Family: Once a week    Attends Religious Services: More than 4 times per year    Active Member of [employer] or Organizations: No    Attends Banker Meetings: Never    Marital Status: Widowed  Intimate Partner Violence: Not At Risk (02/12/2024)   Humiliation, Afraid, Rape, and Kick questionnaire    Fear of Current or Ex-Partner: No    Emotionally Abused: No    Physically Abused: No    Sexually Abused: No    Review of Systems: ROS negative except for what is noted on the assessment and plan.  Vitals:   02/12/24 1103  BP: (!)  118/58  Pulse: 87  Temp: 98.2 F (36.8 C)  TempSrc: Oral  SpO2: 97%  Weight: 157 lb (71.2 kg)  Height: 5' 5 (1.651 m)    Physical Exam: Constitutional: well-appearing female  in no acute distress HENT: normocephalic atraumatic, mucous membranes moist Eyes: conjunctiva non-erythematous Neck: supple Cardiovascular: regular rate and rhythm, no m/r/g Pulmonary/Chest: normal work of  breathing on room air, lungs clear to auscultation bilaterally Abdominal: soft, non-tender, non-distended  Assessment & Plan:   Dysphagia New onset over the last month. No hx of smoking cigarettes or drinking alcohol, no GERD symptoms. Difficulty swallowing only with solid food, not liquids. No throat pain, no vomiting, no nausea. She is able to drink water  in the room as well. Suspect neuromuscular given acute onset, however differential remains broad. Will start with barium swallow, and based off that will refer to GI.   Tinnitus of both ears Chronic issue, seems to be worsening. Audiology referral placed, may need ENT referral.   Forgetfulness Unfortunately was not able able to discuss this fully with her today. Ever since her husband passed in March she has had trouble remembering things unfortunately. She would benefit from a MMSE to determine to help guide if she has cognitive impairments or not. I have scheduled her for a week follow up to discuss this.    Patient discussed with Dr. Chambliss  Leovanni Bjorkman, M.D. [facility], PGY-3 Pager: [phone] Date 02/12/2024 Time 3:41 PM

## 2024-02-12 NOTE — Assessment & Plan Note (Signed)
 Chronic issue, seems to be worsening. Audiology referral placed, may need ENT referral.

## 2024-02-12 NOTE — Patient Instructions (Addendum)
 Thank you so much for coming to the clinic today!   We are going to get you to see a specialist for tinnitus  I am placing an order for a swallowing evaluation, once the results come back I will let you know For your eyes, it looks like your eyes are very dry, I would recommend continuing the dry eye drops for now, while I look for something better that can help your symptoms.   If you have any questions please feel free to the call the clinic at anytime at [phone]. It was a pleasure seeing you!  Best, Dr. Ramiel Forti

## 2024-02-12 NOTE — Assessment & Plan Note (Signed)
 Unfortunately was not able able to discuss this fully with her today. Ever since her husband passed in March she has had trouble remembering things unfortunately. She would benefit from a MMSE to determine to help guide if she has cognitive impairments or not. I have scheduled her for a week follow up to discuss this.

## 2024-02-12 NOTE — Assessment & Plan Note (Signed)
 New onset over the last month. No hx of smoking cigarettes or drinking alcohol, no GERD symptoms. Difficulty swallowing only with solid food, not liquids. No throat pain, no vomiting, no nausea. She is able to drink water  in the room as well. Suspect neuromuscular given acute onset, however differential remains broad. Will start with barium swallow, and based off that will refer to GI.

## 2024-02-15 ENCOUNTER — Ambulatory Visit: Payer: Self-pay

## 2024-02-15 ENCOUNTER — Emergency Department (HOSPITAL_COMMUNITY)
Admission: EM | Admit: 2024-02-15 | Discharge: 2024-02-15 | Discharge status: Against Medical Advice | Attending: Emergency Medicine | Admitting: Emergency Medicine

## 2024-02-15 ENCOUNTER — Other Ambulatory Visit: Payer: Self-pay

## 2024-02-15 ENCOUNTER — Emergency Department (HOSPITAL_COMMUNITY)

## 2024-02-15 ENCOUNTER — Encounter (HOSPITAL_COMMUNITY): Payer: Self-pay

## 2024-02-15 DIAGNOSIS — Z85828 Personal history of other malignant neoplasm of skin: Secondary | ICD-10-CM | POA: Diagnosis not present

## 2024-02-15 DIAGNOSIS — I63511 Cerebral infarction due to unspecified occlusion or stenosis of right middle cerebral artery: Secondary | ICD-10-CM | POA: Diagnosis not present

## 2024-02-15 DIAGNOSIS — J449 Chronic obstructive pulmonary disease, unspecified: Secondary | ICD-10-CM | POA: Insufficient documentation

## 2024-02-15 DIAGNOSIS — G459 Transient cerebral ischemic attack, unspecified: Secondary | ICD-10-CM | POA: Diagnosis not present

## 2024-02-15 DIAGNOSIS — I639 Cerebral infarction, unspecified: Secondary | ICD-10-CM | POA: Diagnosis not present

## 2024-02-15 DIAGNOSIS — Z5329 Procedure and treatment not carried out because of patient's decision for other reasons: Secondary | ICD-10-CM | POA: Insufficient documentation

## 2024-02-15 DIAGNOSIS — M4802 Spinal stenosis, cervical region: Secondary | ICD-10-CM | POA: Diagnosis not present

## 2024-02-15 DIAGNOSIS — R29898 Other symptoms and signs involving the musculoskeletal system: Secondary | ICD-10-CM | POA: Diagnosis not present

## 2024-02-15 DIAGNOSIS — M5011 Cervical disc disorder with radiculopathy,  high cervical region: Secondary | ICD-10-CM | POA: Diagnosis not present

## 2024-02-15 DIAGNOSIS — E876 Hypokalemia: Secondary | ICD-10-CM | POA: Diagnosis not present

## 2024-02-15 DIAGNOSIS — M50221 Other cervical disc displacement at C4-C5 level: Secondary | ICD-10-CM | POA: Diagnosis not present

## 2024-02-15 DIAGNOSIS — M50322 Other cervical disc degeneration at C5-C6 level: Secondary | ICD-10-CM | POA: Diagnosis not present

## 2024-02-15 DIAGNOSIS — R200 Anesthesia of skin: Secondary | ICD-10-CM | POA: Diagnosis not present

## 2024-02-15 DIAGNOSIS — G936 Cerebral edema: Secondary | ICD-10-CM | POA: Diagnosis not present

## 2024-02-15 LAB — COMPREHENSIVE METABOLIC PANEL WITH GFR
ALT: 15 U/L (ref 0–44)
AST: 17 U/L (ref 15–41)
Albumin: 3.4 g/dL — ABNORMAL LOW (ref 3.5–5.0)
Alkaline Phosphatase: 54 U/L (ref 38–126)
Anion gap: 10 (ref 5–15)
BUN: 9 mg/dL (ref 8–23)
CO2: 24 mmol/L (ref 22–32)
Calcium: 8.9 mg/dL (ref 8.9–10.3)
Chloride: 108 mmol/L (ref 98–111)
Creatinine, Ser: 0.77 mg/dL (ref 0.44–1.00)
GFR, Estimated: >60 mL/min (ref >60)
Glucose, Bld: 113 mg/dL — ABNORMAL HIGH (ref 70–99)
Potassium: 3.4 mmol/L — ABNORMAL LOW (ref 3.5–5.1)
Sodium: 142 mmol/L (ref 135–145)
Total Bilirubin: 0.7 mg/dL (ref 0.0–1.2)
Total Protein: 6.5 g/dL (ref 6.5–8.1)

## 2024-02-15 LAB — CBC WITH DIFFERENTIAL/PLATELET
Abs Immature Granulocytes: 0.05 K/uL (ref 0.00–0.07)
Basophils Absolute: 0 K/uL (ref 0.0–0.1)
Basophils Relative: 0 %
Eosinophils Absolute: 0.2 K/uL (ref 0.0–0.5)
Eosinophils Relative: 2 %
HCT: 39.8 % (ref 36.0–46.0)
Hemoglobin: 12.4 g/dL (ref 12.0–15.0)
Immature Granulocytes: 1 %
Lymphocytes Relative: 30 %
Lymphs Abs: 2.8 K/uL (ref 0.7–4.0)
MCH: 29.7 pg (ref 26.0–34.0)
MCHC: 31.2 g/dL (ref 30.0–36.0)
MCV: 95.2 fL (ref 80.0–100.0)
Monocytes Absolute: 0.8 K/uL (ref 0.1–1.0)
Monocytes Relative: 9 %
Neutro Abs: 5.6 K/uL (ref 1.7–7.7)
Neutrophils Relative %: 58 %
Platelets: 320 K/uL (ref 150–400)
RBC: 4.18 MIL/uL (ref 3.87–5.11)
RDW: 12 % (ref 11.5–15.5)
WBC: 9.5 K/uL (ref 4.0–10.5)
nRBC: 0 % (ref 0.0–0.2)

## 2024-02-15 NOTE — Discharge Instructions (Signed)
 You were seen today for numbness and weakness in your arm. While you were here we monitored your vitals, preformed a physical exam, and MRIs. These showed that you had a small stroke.  We recommend admission to the hospital however you have chosen to leave AGAINST MEDICAL ADVICE.  Will be important for you to return to the hospital if you experience any new or worsening symptoms.  Please do not wait and return to the hospital immediately.  Things to do:  - Follow up with your primary care provider as soon as possible.  I have also placed a referral for you to see neurology.  Return to the emergency department if you have any new or worsening symptoms including worsening numbness or weakness in your left arm, difficulty speaking, walking or brain fog, or if you have any other concerns.

## 2024-02-15 NOTE — ED Triage Notes (Signed)
 Pt arrives with c/o left arm weakness and numbness that happened around 10am this morning. Pt denies slurred, facial droop, headache, CP, or dizziness. Pt has hx of a.fib, but does not take a blood thinner.

## 2024-02-15 NOTE — Telephone Encounter (Signed)
 FYI Only or Action Required?: FYI only for provider.  Patient was last seen in primary care on 02/12/2024 by Nooruddin, Saad, MD.  Called Nurse Triage reporting Wrist Pain and Extremity Weakness.  Symptoms began today. Approx 1 hour ago, LWK 0900 this morning  Interventions attempted: Nothing.  Symptoms are: unchanged.  Triage Disposition: Call EMS 911 Now, Go to ED Now (Notify PCP)  Patient/caregiver understands and will follow disposition?: Yes- Patient adamantly refuses EMS, insisting that she will drive herself. This RN advised that she should have someone drive her. Pt says that she has no one and lives alone, but repeatedly says DO NOT send anyone to my home.   Copied from CRM [id number]. Topic: Clinical - Red Word Triage >> Feb 15, 2024 10:44 AM [name] wrote: Red Word that prompted transfer to Nurse Triage: Patient is calling because she is having some pain in her left wrist. Patient is wanting to be seen today. Pain level is a 10.  Reason for Disposition  [1] Weakness (i.e., paralysis, loss of muscle strength) of the face, arm / hand, or leg / foot on one side of the body AND [2] sudden onset AND [3] brief (now gone)  [1] Weakness (i.e., paralysis, loss of muscle strength) of the face, arm / hand, or leg / foot on one side of the body AND [2] sudden onset AND [3] present now  (Exception: Bell's palsy suspected: weakness on one side of the face developing over hours to days, with no other symptoms.)  Answer Assessment - Initial Assessment Questions 1. SYMPTOM: What is the main symptom you are concerned about? (e.g., weakness, numbness)     Left arm went limp, feels numbs in arm. Very weak. Entire arm  2. ONSET: When did this start? (e.g., minutes, hours, days; while sleeping)     Almost 1 hour ago  3. LAST NORMAL: When was the last time you (the patient) were normal (no symptoms)?     Felt fine around 9am  4. PATTERN Does this come and go, or has it been constant  since it started?  Is it present now?     Present now  5. CARDIAC SYMPTOMS: Have you had any of the following symptoms: chest pain, difficulty breathing, palpitations?     No cardiac symptoms  6. NEUROLOGIC SYMPTOMS: Have you had any of the following symptoms: headache, dizziness, vision loss, double vision, changes in speech, unsteady on your feet?     No neuro symptoms  7. OTHER SYMPTOMS: Do you have any other symptoms?     No  8. PREGNANCY: Is there any chance you are pregnant? When was your last menstrual period?     No  Protocols used: Neurologic Deficit-A-AH

## 2024-02-15 NOTE — ED Provider Notes (Signed)
 [facility] EMERGENCY DEPARTMENT AT [facility] Provider Note  MDM   HPI/ROS:  Sheri [name] is a 73 y.o. female with a medical history as below who presents with acute onset left arm numbness and weakness that started approximately 10 AM this morning and has gradually resolved.  She now states that she did not have any residual numbness but does still feel like she has some weakness.  She denies any chest pain or shortness of breath, headache, change in her vision, slurred speech or difficulty finding her words during this event.  She denies any difficulty ambulating, leg numbness or weakness.  Physical exam is notable for: - Benign neurologic exam, hypertension  On my initial evaluation, patient is:  -Vital signs stable.  With some hypertension to 170 systolic.  Patient afebrile, hemodynamically stable, and non-toxic appearing.  This patient's current presentation, including their history and physical exam, is most consistent with CVA versus TIA or radiculopathy. Differentials include ACS, radiculopathy, CVA, TIA.    Physical exam is reassuring as patient has a benign neurologic exam with no deficits.  Her story is concerning for possible CVA however reassuring CT head.  Given her symptoms started approximately 11 hours ago believe MRI is appropriate.  Patient is endorsing a need to go home to care for her dog.  After further discussion she is agreeable to MRI however I am concerned that she may leave AMA at some point.  We discussed the risks and benefits of leaving the hospital without this test or without reads of this test and for now she is agreeable to staying in the emergency department.***  Interpretations, interventions, and the patient's course of care are documented below.    Clinical Course as of 02/15/24 2152  Mon Feb 15, 2024  2133 ED EKG Sinus rhythm with normal axis and intervals.  No STE or depression.  Nonischemic EKG [RC]  2134 CT Cervical Spine Wo Contrast Mild C4  disc herniation [RC]  2134 CT Head Wo Contrast No acute findings [RC]  2151 Potassium(!): 3.4 CMP reassuring other than mild hypokalemia.  No gross electrolyte abnormalities, hepatic and renal function appropriate [RC]  2152 CBC without leukocytosis or anemia [RC]    Clinical Course User Index [RC] Sharyne Darina RAMAN, MD   ***   Disposition:  {ED Dispo:29898}  Clinical Impression: No diagnosis found.  Rx / DC Orders ED Discharge Orders     None       The plan for this patient was discussed with Dr. tonia, who voiced agreement and who oversaw evaluation and treatment of this patient.   Clinical Complexity A medically appropriate history, review of systems, and physical exam was performed.  My independent interpretations of EKG, labs, and radiology are documented in the ED course above.   If decision rules were used in this patient's evaluation, they are listed below.   Click here for ABCD2, HEART and other calculatorsREFRESH Note before signing   Patient's presentation is most consistent with acute presentation with potential threat to life or bodily function.  Medical Decision Making   HPI/ROS      See MDM section for pertinent HPI and ROS. A complete ROS was performed with pertinent positives/negatives noted above.   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   COPD (chronic obstructive pulmonary disease) (HCC)    no per pt   Depression    treated at [facility]   GERD (gastroesophageal reflux disease)    Goiter    hx of goiter   Hx  of atrial tachycardia since age 42 03/15/2013   Memory loss 12/31/2016   Mnire's disease 03/07/2016   Palpitations    Associated with difficulty breathing   Panic attacks    Poisoning by selective serotonin reuptake inhibitors(969.03) Jan 2007   severe reaction with MAOI (nardil) and dextromethorphan   Postmenopausal 12/31/2016   SVT (supraventricular tachycardia) (HCC) 03/15/2013   History of SVT s/p afib ablation, 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 Following with cardiology.    Swollen gland 09/14/2018   Tachycardia    Tonsillitis 11/12/2018    Past Surgical History:  Procedure Laterality Date   APPENDECTOMY     COLONOSCOPY     ELECTROPHYSIOLOGIC STUDY N/A 10/12/2015   Procedure: Atrial Fibrillation Ablation;  Surgeon: Will Gladis Norton, MD;  Location: [facility] INVASIVE CV LAB;  Service: Cardiovascular;  Laterality: N/A;   LID LESION EXCISION Left 08/06/2017   Procedure: EXCISION BIOPSY OF EYELID, RECONSTRUCTION OF EYELID;  Surgeon: Laurie Loyd Redhead, MD;  Location: [facility] OR;  Service: Ophthalmology;  Laterality: Left;   OVARIAN CYST REMOVAL  09/1967   left ovarian cystectomy- 17.5 lb cyst removed- per pt   right mastoidectomy     ET tube placement in 1980 and 1990   right sinus surgery  october 2000   SVT ABLATION N/A 07/25/2020   Procedure: SVT ABLATION;  Surgeon: Norton Soyla Gladis, MD;  Location: [facility] INVASIVE CV LAB;  Service: Cardiovascular;  Laterality: N/A;   SVT ABLATION N/A 10/26/2020   Procedure: SVT ABLATION;  Surgeon: Norton Soyla Gladis, MD;  Location: [facility] INVASIVE CV LAB;  Service: Cardiovascular;  Laterality: N/A;   THYROIDECTOMY, PARTIAL     nodule removal - April 1996      Physical Exam   Vitals:   02/15/24 1739 02/15/24 1801 02/15/24 2104 02/15/24 2130  BP: (!) 128/111  (!) 170/87 (!) 148/92  Pulse: 74  70 69  Resp: 18  20 18   Temp: 98.1 F (36.7 C)  97.7 F (36.5 C)   TempSrc:   Oral   SpO2: 94%  97% 100%  Weight:  71.2 kg    Height:  5' 5 (1.651 m)       Physical Exam Vitals and nursing note reviewed.  Constitutional:      General: She is not in acute distress.    Appearance: She is well-developed.  HENT:     Head: Normocephalic and atraumatic.  Eyes:     Conjunctiva/sclera: Conjunctivae normal.  Cardiovascular:     Rate and Rhythm: Normal rate and regular rhythm.     Heart sounds: No murmur heard. Pulmonary:     Effort: Pulmonary effort is normal. No respiratory distress.     Breath sounds: Normal breath sounds.  Abdominal:     Palpations: Abdomen is soft.     Tenderness: There is no abdominal tenderness.  Musculoskeletal:        General: No swelling.     Cervical back: Neck supple.  Skin:    General: Skin is warm and dry.     Capillary Refill: Capillary refill takes less than 2 seconds.  Neurological:     General: No focal deficit present.     Mental Status: She is alert and oriented to person, place, and time. Mental status is at baseline.     GCS: GCS eye subscore is 4. GCS verbal subscore is 5. GCS motor subscore is 6.     Cranial Nerves: Cranial nerves 2-12 are intact.     Sensory: Sensation is intact.     Motor: Motor function is intact.     Gait: Gait is intact.  Psychiatric:        Mood and Affect: Mood normal.      Procedures   If procedures were preformed on this patient, they are listed below:  Procedures   @BBSIG @   Please note that this documentation was produced with the assistance of voice-to-text technology and may contain errors.

## 2024-02-15 NOTE — ED Notes (Signed)
 Pt leaving ama. Pt understands risks and signed AMA form. Pt stating she will come back tomorrow. Pt in NAD at this time

## 2024-02-15 NOTE — ED Provider Triage Note (Cosign Needed)
 Emergency Medicine Provider Triage Evaluation Note  Sheri [name] , a 73 y.o. female  was evaluated in triage.  Pt complains of weakness, numbness to the left upper extremity.  Weakness has resolved.  She reached out to her PCP and was recommended coming to the emergency department.  Review of Systems  Positive: As above Negative: As above  Physical Exam  BP (!) 128/111   Pulse 74   Temp 98.1 F (36.7 C)   Resp 18   SpO2 94%  Gen:   Awake, no distress   Resp:  Normal effort  MSK:   Moves extremities without difficulty  Other:    Medical Decision Making  Medically screening exam initiated at 6:00 PM.  Appropriate orders placed.  Ronal VEAR Orange was informed that the remainder of the evaluation will be completed by another provider, this initial triage assessment does not replace that evaluation, and the importance of remaining in the ED until their evaluation is complete.    Hildegard Loge, PA-C 02/15/24 1800

## 2024-02-16 ENCOUNTER — Telehealth: Payer: Self-pay | Admitting: *Deleted

## 2024-02-16 NOTE — Telephone Encounter (Signed)
 Will forward to Dr. Kandis. Copied from CRM [id number]. Topic: General - Other >> Feb 16, 2024  3:31 PM Susanna ORN wrote: Reason for CRM: Patient called to give a message to Dr. Kandis. She states she called yesterday to get an appt to see her but she didn't have availability. Patient states she was told to go to the hospital. Patient states she went to the hospital and they told her that she suffered a small stroke. She feels like Dr. Kandis should know this so that she can pull any information and decide if she wants her to come in and be seen or not. Please have Dr. Kandis to contact the patient to let her know and patient stated that if she does, then call her with an appt date & time. CB #: [phone].

## 2024-02-16 NOTE — Telephone Encounter (Unsigned)
 Copied from CRM [id number]. Topic: General - Other >> Feb 16, 2024  3:31 PM Susanna ORN wrote: Reason for CRM: Patient called to give a message to Dr. Kandis. She states she called yesterday to get an appt to see her but she didn't have availability. Patient states she was told to go to the hospital. Patient states she went to the hospital and they told her that she suffered a small stroke. She feels like Dr. Kandis should know this so that she can pull any information and decide if she wants her to come in and be seen or not. Please have Dr. Kandis to contact the patient to let her know and patient stated that if she does, then call her with an appt date & time. CB #: [phone].

## 2024-02-17 ENCOUNTER — Telehealth: Payer: Self-pay | Admitting: Student

## 2024-02-17 NOTE — Telephone Encounter (Signed)
 I see she has an appointment on 02/24/24 with Dr. Edgardo. I called and left voicemail stressing importance of follow-up after her acute stroke.  Ozell Kung MD 02/17/2024, 4:39 PM

## 2024-02-17 NOTE — Telephone Encounter (Signed)
 RTC to patient no answer.  Message was left on Voice Mail that the Clinics had returned her call.

## 2024-02-17 NOTE — Progress Notes (Signed)
 Internal Medicine Clinic Attending  Case discussed with the resident at the time of the visit.  We reviewed the resident's history and exam and pertinent patient test results.  I agree with the assessment, diagnosis, and plan of care documented in the resident's note.

## 2024-02-17 NOTE — Telephone Encounter (Signed)
 Rec'd call from E2C[facility] Requesting for a nurse to speak directly to the pt.  Per the pt no one spoke with her yesterday and she is having a hard time getting anyone to speak directly to her here at the [facility] office.  The pt does not understand why no one has called her back since yesterday. Pt, States This is going make me have a another stroke, all I need to do is discuss what happened when I went to the ED.  Pt states, I should not have to go through speaking with so many different people and why doesn't  Dr Kandis call her back. Pt also requesting  not to  be sch with a resident and wants to be with a provider like her previous PCP,  Dr. Leopold.  Transferred the pt's call to a Triage Nurse but the call dropped.  Tried calling the patient back twice and the phone line went directly to VM. All Conversations (Newest Message First)              02/16/24  3:50 PM Herbin, Sheri FALCON, RN routed this conversation to Kandis Perkins, DO  [place]ett Sheri FALCON, RN    02/16/24  3:49 PM Note Will forward to Dr. Kandis. Copied from CRM [id number]. Topic: General - Other >> Feb 16, 2024  3:31 PM Sheri [name] wrote: Reason for CRM: Patient called to give a message to Dr. Kandis. She states she called yesterday to get an appt to see her but she didn't have availability. Patient states she was told to go to the hospital. Patient states she went to the hospital and they told her that she suffered a small stroke. She feels like Dr. Kandis should know this so that she can pull any information and decide if she wants her to come in and be seen or not. Please have Dr. Kandis to contact the patient to let her know and patient stated that if she does, then call her with an appt date & time. CB #: [phone].       02/16/24  3:20 PM Murray Sheri [name] contacted Sheri [name]

## 2024-02-19 ENCOUNTER — Telehealth (HOSPITAL_COMMUNITY): Payer: Self-pay | Admitting: *Deleted

## 2024-02-19 NOTE — Telephone Encounter (Signed)
 Attempted to contact patient to schedule OP MBS. Left VM @ [phone] and requested a call back. RKEEL

## 2024-02-24 ENCOUNTER — Ambulatory Visit: Payer: Self-pay

## 2024-02-24 ENCOUNTER — Encounter

## 2024-02-24 NOTE — Telephone Encounter (Signed)
 Pt appt has been re-schedule for Friday 8/8w/Dr Edgardo.

## 2024-02-24 NOTE — Telephone Encounter (Signed)
 FYI Only or Action Required?: FYI only for provider.  Patient was last seen in primary care on 02/12/2024 by Nooruddin, Saad, MD.  Called Nurse Triage reporting No chief complaint on file..  Symptoms began a week ago.  Interventions attempted: Other: ED visit on 7/28, had no-show follow-up appt for today, rescheduled with someone else for 8/8.  Symptoms are: I'm fine. - pt not wanting triage  Triage Disposition: Duplicate Contact Calls  Patient/caregiver understands and will follow disposition?:      Copied from CRM #[id number]. Topic: Clinical - Red Word Triage >> Feb 24, 2024  2:14 PM Zane FALCON wrote: Red Word that prompted transfer to Nurse Triage:   Recent stroke on 07/28   Reason for Disposition  Caller has already spoken with another triager and has no further questions.    Already rescheduled appt with someone, not wanting triage  Answer Assessment - Initial Assessment Questions 1. REASON FOR CALL: What is the main reason for your call? or How can I best help you? Attempted confirming with pt that she called about stroke     Pt states didn't call about that but fine now, had called to reschedule appt, someone scheduled her No further questions or concerns, not wanting triage  Protocols used: Information Only Call - No Triage-A-AH, No Contact or Duplicate Contact Call-A-AH

## 2024-02-26 ENCOUNTER — Other Ambulatory Visit (HOSPITAL_COMMUNITY): Payer: Self-pay | Admitting: *Deleted

## 2024-02-26 ENCOUNTER — Inpatient Hospital Stay

## 2024-02-26 DIAGNOSIS — R1310 Dysphagia, unspecified: Secondary | ICD-10-CM

## 2024-02-29 ENCOUNTER — Inpatient Hospital Stay

## 2024-03-01 ENCOUNTER — Ambulatory Visit

## 2024-03-01 VITALS — BP 129/61 | HR 68 | Temp 97.6°F | Ht 65.0 in | Wt 155.0 lb

## 2024-03-01 DIAGNOSIS — I639 Cerebral infarction, unspecified: Secondary | ICD-10-CM | POA: Diagnosis not present

## 2024-03-01 DIAGNOSIS — Z7689 Persons encountering health services in other specified circumstances: Secondary | ICD-10-CM

## 2024-03-01 DIAGNOSIS — H5789 Other specified disorders of eye and adnexa: Secondary | ICD-10-CM

## 2024-03-01 DIAGNOSIS — H9313 Tinnitus, bilateral: Secondary | ICD-10-CM | POA: Diagnosis not present

## 2024-03-01 LAB — POCT GLYCOSYLATED HEMOGLOBIN (HGB A1C): Hemoglobin A1C: 5.2 % (ref 4.0–5.6)

## 2024-03-01 LAB — GLUCOSE, CAPILLARY: Glucose-Capillary: 86 mg/dL (ref 70–99)

## 2024-03-01 MED ORDER — APIXABAN 5 MG PO TABS: 5.0000 mg | ORAL_TABLET | Freq: Two times a day (BID) | ORAL | 3 refills | Status: DC

## 2024-03-01 MED ORDER — ROSUVASTATIN CALCIUM 40 MG PO TABS: 40.0000 mg | ORAL_TABLET | Freq: Every day | ORAL | 3 refills | Status: AC

## 2024-03-01 NOTE — Patient Instructions (Addendum)
 You were seen for stroke follow-up visit today.  Please follow the instructions as discussed in today's plan: --We started you on a stronger cholesterol medication as you had a recent stroke: Start taking rosuvastatin  40 mg daily --Stop taking your home pravastatin  20 mg --Started you on a blood thinner called Eliquis : Start taking Eliquis  5 mg twice a day --We collected labs from you today.  Will give you a call if there are any abnormal results.  If you have any questions please give us  a callback --Continue taking your aspirin  81 mg daily --Please follow-up with neurology.  We have placed a referral.  If you do not hear a callback from them in the next few days please call us . --We have placed a referral for an ultrasound of your neck. You should be hearing back from them soon   Thank you for seeing us !  Sincerely, Dr. Edgardo

## 2024-03-01 NOTE — Progress Notes (Signed)
 CC: ED follow-up of stroke CVA   HPI:  [name] is a 73 y.o. female living with a history stated below and presents today for ED follow-up of stroke. Pt was seen in the ED 02/15/24 for left arm numbness and weakness.  Her MRI showed right precentral gyrus acute infarct but she left AMA.  She was advised to follow-up with neurologist and to continue taking her asprin 81 mg.   Today, patient mentioned that she still has some left arm weakness.  She denies any headache, numbness, tingling in the left side of the body.  She denies weakness in her left leg, drooling from her mouth.  Patient mention that she never followed up with the neurologist as she thought they would be contacting her.  Patient mentioned she takes aspirin  81 mg daily for her A-fib and has been doing that for a long time even though, her chart does not have A-fib on her medication list.  Please see problem based assessment and plan for additional details.     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   COPD (chronic obstructive pulmonary disease) (HCC)    no per pt   Depression    treated at [facility]   GERD (gastroesophageal reflux disease)    Goiter    hx of goiter   Hx of atrial tachycardia since age 51 03/15/2013   Memory loss 12/31/2016   Mnire's disease 03/07/2016   Palpitations    Associated with difficulty breathing   Panic attacks    Poisoning by selective serotonin reuptake inhibitors(969.03) Jan 2007   severe reaction with MAOI (nardil) and dextromethorphan    Postmenopausal 12/31/2016   SVT (supraventricular tachycardia) (HCC) 03/15/2013   History of SVT s/p afib ablation, 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 Following with cardiology.    Swollen gland 09/14/2018   Tachycardia    Tonsillitis 11/12/2018    Current Outpatient Medications on File Prior to Visit  Medication Sig Dispense Refill   albuterol  (VENTOLIN  HFA) 108 (90 Base) MCG/ACT inhaler Inhale 2 puffs into the lungs every 6 (six) hours as needed for wheezing or shortness of breath. 8 g 2   alendronate  (FOSAMAX ) 70 MG tablet Take 1 tablet (70 mg total) by mouth once a week. Take with a full glass of water  on an empty stomach. 4 tablet 12   fluticasone  (FLONASE ) 50 MCG/ACT nasal spray Use 2 spray(s) in each nostril once daily 16 g 0   HYDROcodone -acetaminophen  (NORCO/VICODIN) 5-325 MG tablet Take 1 tablet by mouth every 6 (six) hours as needed. (Patient not taking: Reported on 09/03/2023) 8 tablet 0   meclizine  (ANTIVERT ) 25 MG tablet Take 1 tablet (25 mg total) by mouth 3 (three) times daily as needed for dizziness. 60 tablet 3   pravastatin  (PRAVACHOL ) 20 MG tablet Take 1 tablet by mouth once daily 30 tablet 0   propranolol  (INDERAL ) 20 MG tablet Take 1 tablet (20 mg total) by mouth 2 (two)  times daily. 180 tablet 3   QUEtiapine  (SEROQUEL ) 200 MG tablet Take 100 mg by mouth at bedtime.     traZODone  (DESYREL ) 100 MG tablet Take 100 mg by mouth at bedtime.     umeclidinium bromide  (INCRUSE ELLIPTA ) 62.5 MCG/ACT AEPB Inhale 1 puff into the lungs daily. (Patient not taking: Reported on 09/03/2023) 30 each 11   venlafaxine  (EFFEXOR ) 75 MG tablet Take by mouth. (Patient not taking: Reported on 09/03/2023)     venlafaxine  XR (EFFEXOR -XR) 75 MG 24 hr capsule Take 150 mg by mouth daily. Taking 225mg  total per day.     No current facility-administered medications on file prior to visit.    Family History  Problem Relation Age of Onset   Hypertension Mother    Hypertension Father     Psoriasis Father    Heart attack Father    Atrial fibrillation Sister        had a successful a fib ablation   Atrial fibrillation Sister        had a successful a fib ablation   Breast cancer Niece 78   Colon cancer Neg Hx    Esophageal cancer Neg Hx    Stomach cancer Neg Hx    Rectal cancer Neg Hx    Colon polyps Neg Hx     Social History   Socioeconomic History   Marital status: Widowed    Spouse name: Not on file   Number of children: Not on file   Years of education: Not on file   Highest education level: Not on file  Occupational History   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   Social Drivers of Corporate investment banker Strain: Low Risk  (02/12/2024)   Overall Financial Resource Strain (CARDIA)    Difficulty of Paying Living Expenses: Not very hard  Food Insecurity: No Food Insecurity (02/12/2024)   Hunger Vital Sign    Worried About Running Out of Food in the Last Year: Never true    Ran Out of Food in the Last Year: Never true  Transportation Needs: No Transportation Needs (02/12/2024)   PRAPARE - Administrator, Civil Service (Medical): No    Lack of Transportation (Non-Medical): No  Physical Activity: Inactive (02/12/2024)   Exercise Vital Sign    Days of Exercise per Week: 0 days    Minutes of Exercise per Session: 0 min  Stress: Stress Concern Present (02/12/2024)   [employer] of Occupational Health - Occupational Stress Questionnaire    Feeling of Stress: Rather much  Social Connections: Moderately Isolated (02/12/2024)   Social Connection and  Isolation Panel    Frequency of Communication with Friends and Family: More than three times a week    Frequency of Social Gatherings with Friends and Family: Once a week    Attends Religious Services: More than 4 times per year    Active Member of [employer] or Organizations: No    Attends Banker Meetings: Never  Marital Status: Widowed  Intimate Partner Violence: Not At Risk (02/12/2024)   Humiliation, Afraid, Rape, and Kick questionnaire    Fear of Current or Ex-Partner: No    Emotionally Abused: No    Physically Abused: No    Sexually Abused: No    Review of Systems: ROS All pertinent review of systems and history, assessment, plan  There were no vitals filed for this visit.  Physical Exam: Physical Exam Eyes:     General:        Right eye: No discharge.        Left eye: No discharge.     Extraocular Movements: Extraocular movements intact.     Conjunctiva/sclera: Conjunctivae normal.  Cardiovascular:     Rate and Rhythm: Normal rate and regular rhythm.  Pulmonary:     Effort: Pulmonary effort is normal.     Breath sounds: Normal breath sounds.  Musculoskeletal:     Right lower leg: No edema.     Left lower leg: No edema.  Neurological:     General: No focal deficit present.     Mental Status: She is alert.     Cranial Nerves: No cranial nerve deficit.     Sensory: No sensory deficit.     Comments: No weakness appreciated in left arm versus right arm.  Negative finger-to-nose test.      Assessment & Plan:     Patient seen with Dr. Shawn  Assessment & Plan Cerebrovascular accident (CVA), unspecified mechanism (HCC) Patient was seen in the ED for CVA which included acute infarct of right precentral gyrus on 02/15/2024.  She endorses some left hand weakness.  We explained to the patient that there is an increased chance of her having another stroke within 3 months of her previous stroke period.  We discussed with the patient that she was supposed to be  on a blood thinner due to her history with A-fib. She was only on propranolol  20 mg tablet and her CHA2DS2-VASc score was high enough for her to be on anticoagulation.  We also advised her to start using high intensity statin and to continue with her aspirin .  We will also get other labs for her stroke workup today. --Patient started on Eliquis  5 mg twice daily --Patient started on rosuvastatin  40 mg daily --Placed referral for ultrasound of bilateral carotids --Continue aspirin  81 mg daily --Referral to neurology placed.  Patient advised to follow-up with neurology as soon as possible and also mentioned in the referral the urgency to see her. --Will get A1c today --Will get lipid panel today Seen by dental surgery service Received a medical consultation [facility]  Patient required clearance from us  for anticipated dental treatment that included oral surgery with extractions, bone reduction, and implant placement.  Due to patient's recent CVA on 02/15/2024 we started her on Eliquis  twice daily and recommended this procedure to be delayed for at least 3 months so she can be cleared for it.  Furthermore, patient has also been on Fosamax  for a few years which increases her risk of osteonecrosis of the jaw.  Thus, we discussed with the patient that she cannot be cleared for surgery at this time.  Her dentist can approach us  again after about 3 months to evaluate her for clearance.  Eye discharge Patient complained about profuse clear eye discharge from both eyes for over 1 year.  Wakes up with matted shut eyes.  Patient denies any swelling or pain in either eye. Her  discharge is not yellow and was chronic and we were unable to see any discharge from her eyes on her physical exam today.  So we were not concerned about bacterial infections or any emergent cause.  Would likely be allergic as she also has pets at home.  Asked patient to try taking over-the-counter oral antihistamines like  Allegra, Zyrtec to see if they helped with her eye condition.  Also advised patient to call optometrists in the area under her insurance coverage as they might have openings sooner. Tinnitus of both ears Audiology referral placed during last visit.  Will wait on audiology to contact.   No orders of the defined types were placed in this encounter.    Rebecka Pion, D.O. [facility] Internal Medicine, PGY-1 Date 03/01/2024 Time 3:09 PM

## 2024-03-02 LAB — LIPID PANEL
Chol/HDL Ratio: 2.4 ratio (ref 0.0–4.4)
Cholesterol, Total: 143 mg/dL (ref 100–199)
HDL: 59 mg/dL (ref >39)
LDL Chol Calc (NIH): 68 mg/dL (ref 0–99)
Triglycerides: 85 mg/dL (ref 0–149)
VLDL Cholesterol Cal: 16 mg/dL (ref 5–40)

## 2024-03-02 NOTE — Assessment & Plan Note (Signed)
 Audiology referral placed during last visit.  Will wait on audiology to contact.

## 2024-03-04 ENCOUNTER — Ambulatory Visit: Payer: Self-pay

## 2024-03-08 ENCOUNTER — Ambulatory Visit

## 2024-03-09 NOTE — Addendum Note (Signed)
 Addended by: SHAWN SICK on: 03/09/2024 08:45 AM   Modules accepted: Level of Service

## 2024-03-09 NOTE — Progress Notes (Signed)
 Internal Medicine Clinic Attending  I was physically present during the key portions of the resident provided service and participated in the medical decision making of patient's management care. I reviewed pertinent patient test results.  The assessment, diagnosis, and plan were formulated together and I agree with the documentation in the resident's note.  Here for follow up after p/w to ED on 7/28 with acute onset L arm numbness and weakness that had resolved by the time of her presentation. Subsequent MRI w/ acute infarct of R precentral gyrus, c/w deficits. Some residual weakness but denies any worsening deficits, recurrent symptoms or new FND. Currently only on aspirin . Hx of pAF s/p ablation 09/2015 with poor follow up, on propanolol, has declined AC on multiple occasions in the past despite risk of stroke. Now that she is >2 weeks from her initial insult, will start anticoagulation d/t low risk of hemorrhagic conversion. Increase her low intensity statin to high intensity rosuvastatin  40 mg. TTE of note without bubble study but low yield at this point given known longstanding Afib as likely precipitant. Will obtain ultrasound of b/l carotids, check A1c, lipids. Urgent neuro referral. At this junction, will maintain on aspirin  as antiplatelet monotherapy given patient has shown adherence to this medication, defer decision of switching or adding antiplatelet agent to Neuro. Add Eliquis . Informed her that we cannot clear her for any elective surgery for a minimum of 3 months, including any elective dental procedures.   Jone Dauphin MD

## 2024-03-14 ENCOUNTER — Ambulatory Visit (HOSPITAL_COMMUNITY)

## 2024-03-14 ENCOUNTER — Ambulatory Visit (HOSPITAL_COMMUNITY)
Admission: RE | Admit: 2024-03-14 | Discharge: 2024-03-14 | Disposition: A | Discharge status: Home / Self Care | Source: Ambulatory Visit | Attending: *Deleted | Admitting: *Deleted

## 2024-03-14 DIAGNOSIS — R1310 Dysphagia, unspecified: Secondary | ICD-10-CM

## 2024-03-22 ENCOUNTER — Telehealth (HOSPITAL_COMMUNITY): Payer: Self-pay | Admitting: *Deleted

## 2024-03-22 NOTE — Telephone Encounter (Signed)
 Attempted to contact patient to reschedule OP MBS. Left VM @ [phone] requesting a call back. RKEEL

## 2024-03-23 DIAGNOSIS — H47323 Drusen of optic disc, bilateral: Secondary | ICD-10-CM | POA: Diagnosis not present

## 2024-03-23 DIAGNOSIS — Z961 Presence of intraocular lens: Secondary | ICD-10-CM | POA: Diagnosis not present

## 2024-03-24 ENCOUNTER — Telehealth: Payer: Self-pay | Admitting: *Deleted

## 2024-03-24 NOTE — Telephone Encounter (Signed)
 Mammogram appointment (03/08/2024) was canceled.

## 2024-03-26 ENCOUNTER — Ambulatory Visit: Payer: Self-pay | Admitting: Internal Medicine

## 2024-03-26 NOTE — Progress Notes (Signed)
Alpha 1 MM and normal

## 2024-03-29 ENCOUNTER — Telehealth (HOSPITAL_COMMUNITY): Payer: Self-pay | Admitting: *Deleted

## 2024-03-29 NOTE — Telephone Encounter (Signed)
 Attempted to contact patient to reschedule OP MBS. Left VM @ [phone] requesting a call back. RKEEL

## 2024-03-30 ENCOUNTER — Encounter: Payer: Self-pay | Admitting: Neurology

## 2024-03-30 ENCOUNTER — Ambulatory Visit (INDEPENDENT_AMBULATORY_CARE_PROVIDER_SITE_OTHER): Admitting: Neurology

## 2024-03-30 VITALS — BP 128/70 | HR 58 | Ht 65.0 in | Wt 155.0 lb

## 2024-03-30 DIAGNOSIS — I6381 Other cerebral infarction due to occlusion or stenosis of small artery: Secondary | ICD-10-CM | POA: Diagnosis not present

## 2024-03-30 NOTE — Patient Instructions (Signed)
 Continue current medication including Eliquis  Continue follow with PCP, please discuss reducing Crestor  to 1005 mg since LDL is 68 Please call for any additional question or concern Return as needed

## 2024-03-30 NOTE — Progress Notes (Signed)
 [facility]  PATIENT: Sheri [name] DOB: 1950-12-10  REQUESTING CLINICIAN: Tonia Chew, MD HISTORY FROM: Patient  REASON FOR VISIT: Stroke    HISTORICAL  CHIEF COMPLAINT:  Chief Complaint  Patient presents with   New Patient (Initial Visit)    Rm 12, alone NP, CVA    HISTORY OF PRESENT ILLNESS:  Discussed the use of AI scribe software for clinical note transcription with the patient, who gave verbal consent to proceed.  Sheri [name] is a 73 year old female with a history of stroke and atrial fibrillation, SVT who presents for follow-up after a stroke.  She experienced a stroke on July 28th while in the bathroom, noticing weakness in her arm without tingling or pain. After a few hours, she could move it slightly with assistance from her other hand. Her daughter encouraged her to seek medical attention, and she drove herself to [facility] for evaluation. She did not stay overnight at the hospital due to concerns about her dog at home. She has not undergone any physical therapy since the stroke, but her strength has returned to normal. Her stroke labs show a LDL of 68, HgA1C 5.2. Stroke etiology likely small vessel disease but cannot rule out cardioembolic due to history of atrial fibrillation and cortical stroke.   She has a history of supraventricular tachycardia (SVT) and atrial fibrillation, for which she takes Eliquis . She has undergone ablation procedures twice in the past few years. She is also on Crestor  for cholesterol management, although she is unsure whether her cholesterol was initially high or low.  She experiences tingling and numbness in her feet, described as a sensation of 'going to sleep,' which she manages by rubbing her feet to improve circulation. She lives alone and has a dog at home. No recent falls.     OTHER MEDICAL CONDITIONS: SVT, Atrial fibrillation    REVIEW OF SYSTEMS: Full 14 system review of systems performed and negative with  exception of: As noted in the HPI   ALLERGIES: Allergies  Allergen Reactions   Guaifenesin  Palpitations    HOME MEDICATIONS: Outpatient Medications Prior to Visit  Medication Sig Dispense Refill   albuterol  (VENTOLIN  HFA) 108 (90 Base) MCG/ACT inhaler Inhale 2 puffs into the lungs every 6 (six) hours as needed for wheezing or shortness of breath. 8 g 2   alendronate  (FOSAMAX ) 70 MG tablet Take 1 tablet (70 mg total) by mouth once a week. Take with a full glass of water  on an empty stomach. 4 tablet 12   apixaban  (ELIQUIS ) 5 MG TABS tablet Take 1 tablet (5 mg total) by mouth 2 (two) times daily. 60 tablet 3   fluticasone  (FLONASE ) 50 MCG/ACT nasal spray Use 2 spray(s) in each nostril once daily 16 g 0   meclizine  (ANTIVERT ) 25 MG tablet Take 1 tablet (25 mg total) by mouth 3 (three) times daily as needed for dizziness. 60 tablet 3   propranolol  (INDERAL ) 20 MG tablet Take 1 tablet (20 mg total) by mouth 2 (two) times daily. 180 tablet 3   QUEtiapine  (SEROQUEL ) 200 MG tablet Take 100 mg by mouth at bedtime.     rosuvastatin  (CRESTOR ) 40 MG tablet Take 1 tablet (40 mg total) by mouth daily. 30 tablet 3   traZODone  (DESYREL ) 100 MG tablet Take 100 mg by mouth at bedtime.     venlafaxine  (EFFEXOR ) 75 MG tablet Take by mouth.     venlafaxine  XR (EFFEXOR -XR) 75 MG 24 hr capsule Take 150 mg  by mouth daily. Taking 225mg  total per day.     HYDROcodone -acetaminophen  (NORCO/VICODIN) 5-325 MG tablet Take 1 tablet by mouth every 6 (six) hours as needed. (Patient not taking: Reported on 03/30/2024) 8 tablet 0   umeclidinium bromide  (INCRUSE ELLIPTA ) 62.5 MCG/ACT AEPB Inhale 1 puff into the lungs daily. (Patient not taking: Reported on 03/30/2024) 30 each 11   No facility-administered medications prior to visit.    PAST MEDICAL HISTORY: Past Medical History:  Diagnosis Date   AF (atrial fibrillation) (HCC) 10/12/2015   Allergic rhinitis    Anxiety    ANXIETY DISORDER, GENERALIZED 08/18/2006    Qualifier: Diagnosis of  By: Evern MD, Janetta     Arthritis    no per pt   Atrial fibrillation with RVR (HCC)    Cancer (HCC)    left lower eyelid, basal cell cancer   Chiari I malformation (HCC) 03/07/2016   pt unsure of this   Chronic sinusitis    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   COPD (chronic obstructive pulmonary disease) (HCC)    no per pt   Depression    treated at [facility]   GERD (gastroesophageal reflux disease)    Goiter    hx of goiter   Hx of atrial tachycardia since age 32 03/15/2013   Memory loss 12/31/2016   Mnire's disease 03/07/2016   Palpitations    Associated with difficulty breathing   Panic attacks    Poisoning by selective serotonin reuptake inhibitors(969.03) Jan 2007   severe reaction with MAOI (nardil) and dextromethorphan   Postmenopausal 12/31/2016   SVT (supraventricular tachycardia) (HCC) 03/15/2013   History of SVT s/p afib ablation, 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 Following with cardiology.    Swollen gland 09/14/2018   Tachycardia    Tonsillitis 11/12/2018    PAST SURGICAL HISTORY: Past Surgical History:  Procedure Laterality Date   APPENDECTOMY     COLONOSCOPY     ELECTROPHYSIOLOGIC STUDY N/A 10/12/2015   Procedure: Atrial Fibrillation Ablation;  Surgeon: Will Gladis Norton, MD;  Location: [facility] INVASIVE CV LAB;  Service: Cardiovascular;  Laterality: N/A;   LID LESION EXCISION Left 08/06/2017   Procedure: EXCISION BIOPSY OF EYELID, RECONSTRUCTION OF EYELID;  Surgeon: Laurie Loyd Redhead, MD;  Location: [facility] OR;  Service: Ophthalmology;  Laterality: Left;   OVARIAN CYST REMOVAL  09/1967   left ovarian cystectomy- 17.5 lb cyst removed- per pt   right mastoidectomy     ET tube placement in 1980 and 1990   right sinus surgery  october 2000   SVT ABLATION N/A 07/25/2020   Procedure: SVT ABLATION;  Surgeon: Norton Soyla Gladis, MD;  Location: [facility] INVASIVE CV LAB;  Service: Cardiovascular;  Laterality: N/A;   SVT  ABLATION N/A 10/26/2020   Procedure: SVT ABLATION;  Surgeon: Norton Soyla Gladis, MD;  Location: [facility] INVASIVE CV [facility];  Service: Cardiovascular;  Laterality: N/A;   THYROIDECTOMY, PARTIAL     nodule removal - April 1996    FAMILY HISTORY: Family History  Problem Relation Age of Onset   Hypertension Mother    Hypertension Father    Psoriasis Father    Heart attack Father    Atrial fibrillation Sister        had a successful a fib ablation   Atrial fibrillation Sister        had a successful a fib ablation   Breast cancer Niece 71   Colon cancer Neg Hx    Esophageal cancer Neg Hx  Stomach cancer Neg Hx    Rectal cancer Neg Hx    Colon polyps Neg Hx     SOCIAL HISTORY: Social History   Socioeconomic History   Marital status: Widowed    Spouse name: Not on file   Number of children: Not on file   Years of education: Not on file   Highest education level: Not on file  Occupational History   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   Right handed   Retired Manufacturing systems engineer   Caffeine- none   Social Drivers of Corporate investment banker Strain: Low Risk  (02/12/2024)   Overall Financial Resource Strain (CARDIA)    Difficulty of Paying Living Expenses: Not very hard  Food Insecurity: No Food Insecurity (02/12/2024)   Hunger Vital Sign    Worried About Running Out of Food in the Last Year: Never true    Ran Out of Food in the Last Year: Never true  Transportation Needs: No Transportation Needs (02/12/2024)   PRAPARE - Administrator, Civil Service (Medical): No    Lack  of Transportation (Non-Medical): No  Physical Activity: Inactive (02/12/2024)   Exercise Vital Sign    Days of Exercise per Week: 0 days    Minutes of Exercise per Session: 0 min  Stress: Stress Concern Present (02/12/2024)   [employer] of Occupational Health - Occupational Stress Questionnaire    Feeling of Stress: Rather much  Social Connections: Moderately Isolated (02/12/2024)   Social Connection and Isolation Panel    Frequency of Communication with Friends and Family: More than three times a week    Frequency of Social Gatherings with Friends and Family: Once a week    Attends Religious Services: More than 4 times per year    Active Member of [employer] or Organizations: No    Attends Banker Meetings: Never    Marital Status: Widowed  Intimate Partner Violence: Not At Risk (02/12/2024)   Humiliation, Afraid, Rape, and Kick questionnaire    Fear of Current or Ex-Partner: No    Emotionally Abused: No    Physically Abused: No    Sexually Abused: No    PHYSICAL EXAM  GENERAL EXAM/CONSTITUTIONAL: Vitals:  Vitals:   03/30/24 1446  BP: 128/70  Pulse: (!) 58  SpO2: 94%  Weight: 155 lb (70.3 kg)  Height: 5' 5 (1.651 m)   Body mass index is 25.79 kg/m. Wt Readings from Last 3 Encounters:  03/30/24 155 lb (70.3 kg)  03/01/24 155 lb (70.3 kg)  02/15/24 157 lb (71.2 kg)   Patient is in no distress; well developed, nourished and groomed; neck is supple  MUSCULOSKELETAL: Gait, strength, tone, movements noted in Neurologic exam below  NEUROLOGIC: MENTAL STATUS:      No data to display         awake, alert, oriented to person, place and time recent and remote memory intact normal attention and concentration language fluent, comprehension intact, naming intact fund of knowledge appropriate  CRANIAL NERVE:  2nd, 3rd, 4th, 6th - Visual fields full to confrontation, extraocular muscles intact, no nystagmus 5th - facial sensation symmetric 7th - facial  strength symmetric 8th - hearing intact 9th - palate elevates symmetrically, uvula midline 11th - shoulder shrug symmetric 12th - tongue protrusion midline  MOTOR:  normal bulk and tone, full strength in the BUE, BLE  SENSORY:  normal and symmetric to light touch  COORDINATION:  finger-nose-finger, fine finger movements normal  GAIT/STATION:  normal    DIAGNOSTIC DATA (LABS, IMAGING, TESTING) - I reviewed patient records, labs, notes, testing and imaging myself where available.  Lab Results  Component Value Date   WBC 9.5 02/15/2024   HGB 12.4 02/15/2024   HCT 39.8 02/15/2024   MCV 95.2 02/15/2024   PLT 320 02/15/2024      Component Value Date/Time   NA 142 02/15/2024 1800   NA 144 10/28/2023 1207   K 3.4 (L) 02/15/2024 1800   CL 108 02/15/2024 1800   CO2 24 02/15/2024 1800   GLUCOSE 113 (H) 02/15/2024 1800   BUN 9 02/15/2024 1800   BUN 9 10/28/2023 1207   CREATININE 0.77 02/15/2024 1800   CREATININE 0.66 10/09/2015 1107   CALCIUM  8.9 02/15/2024 1800   PROT 6.5 02/15/2024 1800   PROT 6.4 10/28/2023 1207   ALBUMIN 3.4 (L) 02/15/2024 1800   ALBUMIN 3.9 10/28/2023 1207   AST 17 02/15/2024 1800   ALT 15 02/15/2024 1800   ALKPHOS 54 02/15/2024 1800   BILITOT 0.7 02/15/2024 1800   BILITOT 0.4 10/28/2023 1207   GFRNONAA >60 02/15/2024 1800   GFRNONAA 86 11/15/2014 1618   GFRAA 91 07/23/2020 1415   GFRAA >89 11/15/2014 1618   Lab Results  Component Value Date   CHOL 143 03/01/2024   HDL 59 03/01/2024   LDLCALC 68 03/01/2024   TRIG 85 03/01/2024   CHOLHDL 2.4 03/01/2024   Lab Results  Component Value Date   HGBA1C 5.2 03/01/2024   No results found for: VITAMINB12 Lab Results  Component Value Date   TSH 3.470 10/28/2023    MRI Brain 02/15/2024 Acute infarct in the right precentral gyrus.   MRI Cervical spine 02/15/2024 1. Small central disc osteophyte complex at C3-4 with mild impression on the ventral thecal sac but no significant spinal or  foraminal stenosis. 2. Mild annular bulge at C4-5 but no spinal or foraminal stenosis. 3. Degenerate disc disease and facet disease at C5-6. Bulging annulus and osteophytic ridging with slight flattening of the ventral thecal sac and narrowing the ventral CSF space but no significant spinal or foraminal stenosis. 4. Normal MR appearance of the cervical spinal cord.   ASSESSMENT AND PLAN  73 y.o. year old female with history of atrial fibrillation on Eliquis , SVT, hyperlipidemia who is presenting after a right precentral gyrus stroke.  Stroke etiology likely small vessel disease but cannot rule out embolic sources due to cortical stroke and history of of atrial fibrillation.  Crestor  40 was started, LDL 68 advised patient to discuss possibly reducing Crestor  dose to 5 or 10 mg at next visit with PCP   Cerebral infarction of the right precentral gyrus (stroke), with full neurological recovery Remote cerebral infarction with full neurological recovery. Initial symptoms included left arm weakness without pain or tingling. No hospital admission post-event due to personal circumstances. Full recovery of strength achieved. - Continue current medications - Educate on the importance of early hospital presentation for potential thrombolytic therapy within the first four hours of symptom  onset.  Atrial fibrillation Atrial fibrillation managed with Eliquis . No recent complications reported. - Continue Eliquis .  Hyperlipidemia Hyperlipidemia managed with rosuvastatin  (Crestor ). Current cholesterol levels are good (LDL 68, HDL 59). - Discuss reducing rosuvastatin  dosage to 5 or 10 mg with primary care provider.     1. Cerebrovascular accident (CVA) due to occlusion of small artery [facility])      Patient Instructions  Continue current medication including Eliquis  Continue follow with PCP, please discuss reducing Crestor  to 1005 mg since LDL is 68 Please call for any additional question or  concern Return as needed  No orders of the defined types were placed in this encounter.   No orders of the defined types were placed in this encounter.   Return if symptoms worsen or fail to improve.  I have spent a total of 50 minutes dedicated to this patient today, preparing to see patient, performing a medically appropriate examination and evaluation, ordering tests and/or medications and procedures, and counseling and educating the patient/family/caregiver; independently interpreting result and communicating results to the family/patient/caregiver; and documenting clinical information in the electronic medical record.   Pastor Falling, MD 03/30/2024, 3:21 PM  [facility] [address], [address] [place], [place] [address] [phone]

## 2024-04-05 ENCOUNTER — Telehealth (HOSPITAL_COMMUNITY): Payer: Self-pay | Admitting: *Deleted

## 2024-04-05 NOTE — Telephone Encounter (Signed)
 Attempted to contact patient to reschedule OP MBS. Left VM @ [phone] requesting a call back. (AHARRIS)

## 2024-04-18 ENCOUNTER — Other Ambulatory Visit: Payer: Self-pay | Admitting: Cardiology

## 2024-04-18 ENCOUNTER — Ambulatory Visit: Admitting: Neurology

## 2024-04-18 MED ORDER — PROPRANOLOL HCL 20 MG PO TABS: 20.0000 mg | ORAL_TABLET | Freq: Two times a day (BID) | ORAL | 1 refills | Status: AC

## 2024-04-22 ENCOUNTER — Ambulatory Visit: Payer: Self-pay

## 2024-04-22 NOTE — Telephone Encounter (Signed)
 FYI Only or Action Required?: Action required by provider: request for appointment and update on patient condition.  Patient was last seen in primary care on 03/01/2024 by Edgardo Pontiff, DO.  Called Nurse Triage reporting Dizziness and Hypotension.  Symptoms began several days ago.  Interventions attempted: Prescription medications: meclizine .  Symptoms are: unchanged.  Triage Disposition: Call PCP Now  Patient/caregiver understands and will follow disposition?: No, wishes to speak with PCP        Copied from CRM #[id number]. Topic: Clinical - Red Word Triage >> Apr 22, 2024  4:59 PM Debby BROCKS wrote: Red Word that prompted transfer to Nurse Triage: very weak, light headed, dizzy, and feels blood pressure might be low Reason for Disposition  [1] Angry or rude caller AND [2] doesn't respond to 5 minutes of triager counseling AND [3] sick adult (or caller)  Answer Assessment - Initial Assessment Questions 1. DESCRIPTION: Describe your dizziness.     I just feel lightheaded I dont know if my equilibrium is off 2. LIGHTHEADED: Do you feel lightheaded? (e.g., somewhat faint, woozy, weak upon standing)     Feels like fluid is in my ears or head 3. VERTIGO: Do you feel like either you or the room is spinning or tilting? (i.e., vertigo)     *No Answer* 4. SEVERITY: How bad is it?  Do you feel like you are going to faint? Can you stand and walk?     *No Answer* 5. ONSET:  When did the dizziness begin?     A few days 6. AGGRAVATING FACTORS: Does anything make it worse? (e.g., standing, change in head position)     Change in head position 7. HEART RATE: Can you tell me your heart rate? How many beats in 15 seconds?  (Note: Not all patients can do this.)       *No Answer* 8. CAUSE: What do you think is causing the dizziness? (e.g., decreased fluids or food, diarrhea, emotional distress, heat exposure, new medicine, sudden standing, vomiting; unknown)     *No  Answer* 9. RECURRENT SYMPTOM: Have you had dizziness before? If Yes, ask: When was the last time? What happened that time?     *No Answer* 10. OTHER SYMPTOMS: Do you have any other symptoms? (e.g., fever, chest pain, vomiting, diarrhea, bleeding)       *No Answer* 11. PREGNANCY: Is there any chance you are pregnant? When was your last menstrual period?       *No Answer*  Answer Assessment - Initial Assessment Questions 1. SITUATION:  Document reason for call.     Pt calling to schedule appt and voiced frustration with the new triaging process. Triager explained the new triage process to ensure that the patient is not experiencing any serious sx that would required escalated care. Triager looked at PCP appt openings and told pt the next available appt was > 2 weeks. Pt was not happy with response, declined further triage questions, and then disconnected call. 2. BACKGROUND: Document any background information (e.g., prior calls, known psychiatric history)     unknown 3. ASSESSMENT: Document your nursing assessment.     Pt reports dizziness x a few days and has taken meclizine  as prescribed, but still experiencing sx.  4. RESPONSE: Document what your response or recommendation was.     Pt disconnected call before recommendation could be made.  Protocols used: Dizziness - Lightheadedness-A-AH, Difficult Call-A-AH

## 2024-04-25 NOTE — Telephone Encounter (Signed)
 Called pt - stated she needs an appt. Stated she's currently moving.I will have front office to schedule her an appt.

## 2024-04-26 NOTE — Telephone Encounter (Addendum)
 I talked to pt who stated she's taking Meclizine  for the dizziness. And she wants to schedule an appt next week - call transferred to front office.  Appt 05/02/24.

## 2024-05-02 ENCOUNTER — Telehealth (HOSPITAL_COMMUNITY): Payer: Self-pay | Admitting: Internal Medicine

## 2024-05-02 ENCOUNTER — Ambulatory Visit: Admitting: Student

## 2024-05-02 NOTE — Telephone Encounter (Signed)
 OP MBS Order Cancelled - pt was a no show for scheduled op mbs appointment on 03/14/24. Outreach calls to help reschedule were made on 9/2, 9/2, and 9/16. Left voicemail on 9/16 stating a call back needed to be made by 9/19 2:00pm or else the order would be cancelled. Patient will need new order if they wish to proceed with swallow test (AHARRIS)

## 2024-05-05 ENCOUNTER — Ambulatory Visit: Payer: Self-pay

## 2024-05-05 NOTE — Telephone Encounter (Signed)
 FYI Only or Action Required?: Action required by provider: update on patient condition and Please call for workin 10/17 or cancellation appt. Adament about not going to UC/ED.  Patient was last seen in primary care on 03/01/2024 by Edgardo Pontiff, DO.  Called Nurse Triage reporting Dental Injury.  Symptoms began several days ago.  Interventions attempted: Other: salt rinse.  Symptoms are: gradually worsening.  Triage Disposition: No disposition on file.  Patient/caregiver understands and will follow disposition?:   Copied from CRM [id number]. Topic: Clinical - Red Word Triage >> May 05, 2024  4:11 PM Alfonso ORN wrote: Red Word that prompted transfer to Nurse Triage:  patient have teeth broken in the bottom front with pain , and have swelling really bad where teeth are broken off Reason for Disposition  [1] SEVERE pain AND [2] not improved 2 hours after pain medicine/ice  Answer Assessment - Initial Assessment Questions Patient bottom two front teeth are broken and a few days ago it started swelling. In front and back of the teeth. Pain 9/10 especially with eating.States she cannot see the dentist until PCP has assessed and she has take ABX for infection. Denies fever, SOB, CP, or dizziness. Salt water  rinses multiple times a day.  Pt adamant about being seen in the office 10/17. Was not able to locate appt until Monday 10/21 and [facility] confirmed no opening. Advised to send HP to staff for cancellations and provider advise.  Supposed to be getting faculty provider- has been waiting for months to be assigned one. Is very upset that she has been a pt for 20 years and has to see multiple providers every time, even s/p CVA.  Advised UC in the interim for assessment and abx. Pt refuses UC, CAL advised.   1. MECHANISM: How did the injury happen?      Teeth broke 2. ONSET: When did the injury happen? (e.g., minutes or hours ago)      A few days  3. LOCATION: What part of the tooth is injured?       Front bottom two teeth 4. APPEARANCE: What does the tooth look like?      Swollen around the front and back  5. BLEEDING: Is the mouth still bleeding? If Yes, ask: Is it difficult to stop?      When brushing and touching it causes  6. PAIN: Is it painful? If Yes, ask: How bad is the pain? (Scale 0-10; or none, mild, moderate, severe)     10/10 especially when trying to eat 8. OTHER SYMPTOMS: Do you have any other symptoms?      Denies other symptoms.  Protocols used: Tooth Injury-A-AH

## 2024-05-06 NOTE — Telephone Encounter (Signed)
 RTC to patient.  Message left that the Clinics have no available appointments this morning and the Clinics close at 12 noon today.  Will need to go to the ER or UC to get antibiotics or a pain medication for her broken teeth pain.

## 2024-05-23 ENCOUNTER — Encounter: Payer: Self-pay | Admitting: Radiology

## 2024-05-29 NOTE — Progress Notes (Deleted)
 Patient name: Sheri [name] Date of birth: [birth year] Date of visit: 05/30/24  Type of visit: Acute Office Visit  Subjective   Chief concern: No chief complaint on file.   Sheri [name] is a 73 y.o. female with a PMHx of CVA (02/15/24 right precentral gyrus), Afib s/p ablation 09/2015 now on Eliquis , SVT, Meniere's disease, MDD, OA, HLD who presents to [facility] clinic for evaluation of 2 weeks of dizziness, coughing, and congestion.  Called on 10/16 stating she chipped her front bottom teeth and needed antibiotics before being seen by dentist.  ***wants a faculty provider, does not like seeing multiple doctors  Patient Active Problem List   Diagnosis Date Noted   Dysphagia 02/12/2024   Forgetfulness 02/12/2024   MDD (major depressive disorder) 10/28/2023   Encounter for screening mammogram for malignant neoplasm of breast 08/24/2023   Closed fracture of ulna, shaft, right 05/26/2023   Displaced fracture of proximal phalanx of left little finger, initial encounter for closed fracture 05/26/2023   Grief reaction 02/06/2023   Apical lung scarring 11/26/2022   Pure hypercholesterolemia 11/26/2022   Osteoarthritis of hands, bilateral 08/13/2022   BPV (benign positional vertigo) 04/30/2022   Varicose veins of both lower extremities with pain 01/17/2022   Atherosclerosis of aorta 01/17/2022   Skin lesion of back 06/28/2021   Internal and external hemorrhoids without complication 08/10/2020   Diarrhea 08/10/2020   Recurrent major depressive disorder, in partial remission 08/10/2020   Tinnitus of both ears 09/30/2018   Osteoporosis 01/26/2018   Postmenopausal 12/31/2016   Mnire's disease 03/07/2016   Chiari I malformation (HCC) 03/07/2016   Healthcare maintenance 04/14/2013   SVT (supraventricular tachycardia) 03/15/2013     Past Surgical History:  Procedure Laterality Date   APPENDECTOMY     COLONOSCOPY     ELECTROPHYSIOLOGIC STUDY N/A 10/12/2015   Procedure: Atrial Fibrillation  Ablation;  Surgeon: Will Gladis Norton, MD;  Location: [facility];  Service: Cardiovascular;  Laterality: N/A;   LID LESION EXCISION Left 08/06/2017   Procedure: EXCISION BIOPSY OF EYELID, RECONSTRUCTION OF EYELID;  Surgeon: Laurie Loyd Redhead, MD;  Location: [facility] OR;  Service: Ophthalmology;  Laterality: Left;   OVARIAN CYST REMOVAL  09/1967   left ovarian cystectomy- 17.5 lb cyst removed- per pt   right mastoidectomy     ET tube placement in 1980 and 1990   right sinus surgery  october 2000   SVT ABLATION N/A 07/25/2020   Procedure: SVT ABLATION;  Surgeon: Norton Soyla Gladis, MD;  Location: [facility];  Service: Cardiovascular;  Laterality: N/A;   SVT ABLATION N/A 10/26/2020   Procedure: SVT ABLATION;  Surgeon: Norton Soyla Gladis, MD;  Location: [facility];  Service: Cardiovascular;  Laterality: N/A;   THYROIDECTOMY, PARTIAL     nodule removal - April 1996    ROS negative unless otherwise indicated in the HPI or Assessment and Plan.  Current Outpatient Medications  Medication Instructions   albuterol  (VENTOLIN  HFA) 108 (90 Base) MCG/ACT inhaler 2 puffs, Inhalation, Every 6 hours PRN   alendronate  (FOSAMAX ) 70 mg, Oral, Weekly, Take with a full glass of water  on an empty stomach.   apixaban  (ELIQUIS ) 5 mg, Oral, 2 times daily   fluticasone  (FLONASE ) 50 MCG/ACT nasal spray Use 2 spray(s) in each nostril once daily   HYDROcodone -acetaminophen  (NORCO/VICODIN) 5-325 MG tablet 1 tablet, Oral, Every 6 hours PRN   meclizine  (ANTIVERT ) 25 mg, Oral, 3 times daily PRN   propranolol  (INDERAL ) 20 mg, Oral,  2 times daily   QUEtiapine  (SEROQUEL ) 100 mg, Daily at bedtime   rosuvastatin  (CRESTOR ) 40 mg, Oral, Daily   traZODone  (DESYREL ) 100 mg, Daily at bedtime   umeclidinium bromide  (INCRUSE ELLIPTA ) 62.5 MCG/ACT AEPB 1 puff, Inhalation, Daily   venlafaxine  (EFFEXOR ) 75 MG tablet Take by mouth.   venlafaxine  XR (EFFEXOR -XR) 150 mg, Daily    Social History   Tobacco Use    Smoking status: Never    Passive exposure: Current   Smokeless tobacco: Never  Vaping Use   Vaping status: Never Used  Substance Use Topics   Alcohol use: No    Alcohol/week: 0.0 standard drinks of alcohol   Drug use: No      Objective  There were no vitals filed for this visit.There is no height or weight on file to calculate BMI.   Physical Exam: Constitutional: well-appearing, well-nourished; *** weight; no acute distress HENT: normocephalic atraumatic, mucous membranes moist Eyes: conjunctiva non-erythematous Cardiovascular: regular rate and rhythm, no m/r/g Pulmonary/Chest: normal work of breathing on room air, lungs CTAB Abdominal: soft, non-tender, non-distended MSK: normal bulk and tone Neurological: alert & oriented x 3, no focal deficit*** Skin: warm and dry Extremities: BLE without edema or erythema. Psych: normal mood and behavior       Assessment & Plan  There are no diagnoses linked to this encounter.  No follow-ups on file.  Dental surgery -Patient requiring oral surgery with extractions, bone reduction, implant placement -At [facility] appt on 03/01/24, she was requesting clearance form for surgery, but just recently started Eliquis  bid for CVA. -Patient was also on Fosamax  (Alendronate ) which increases risk of osteonecoriss of the jaw  HLD ***Neurology requesting to consider reduce Crestor  to 10 or 5 since LDL is 68    Patient case discussed with Dr. {imcattendings:33109}.  Taejon Irani, MD [facility]  PGY-1 05/30/2024, 8:09 AM

## 2024-05-30 ENCOUNTER — Ambulatory Visit: Payer: Self-pay

## 2024-05-31 NOTE — Progress Notes (Unsigned)
 Patient name: Ronal VEAR Orange Date of birth: 1950/09/15 Date of visit: 06/01/24  Type of visit: Acute Office Visit  Subjective   Chief concern:  Chief Complaint  Patient presents with   Acute Visit    Dizzy spells  pt reports she does have a dx of vertigo and this spell  has been on going for  2 wks now when asking pt to lay on bed to do the ortho stats  laying flat was a problem     ELEXIS POLLAK is a 73 y.o. female with a PMHx of CVA (02/15/24 right precentral gyrus), Afib s/p ablation 09/2015 now on Eliquis , SVT, Meniere's disease, MDD, OA, HLD who presents to [facility] clinic for evaluation of 2 weeks of worsening dizziness, coughing, and congestion. The patient is also experiencing vertigo. Meclizine  has not really helped with her symptoms. The patient states that anytime she gets up from lying she feels like the room is the spinning. States that she had Meniere's disease 20 years ago and had a surgical procedure to place a tube in her ear. Her dizziness/coughing/congestion have gotten worse over the past few weeks. Patient does not know why, she can't point to anything that has changed in her life in the past few weeks. Endorses a postnasal drip sensation which makes her cough. The dizziness episodes last about a minute or two.  Associated symptoms include bilateral ringing in her ears.  Denies any new neurologic symptoms including weakness, numbness, tingling, headaches, CP, SOB at this time.  The patient also endorses experiencing difficulty swallowing large pieces of solid food.  She has to cut up her food into smaller pieces and if she does not do this, she will feel like the food gets stuck in the back of her throat.  No problems swallowing liquids.  These symptoms were present before her stroke.  States that she has not had a speech/swallow study performed even during her hospitalization.  No bleeding symptoms with Eliquis .  Patient Active Problem List   Diagnosis Date Noted   Dysphagia  02/12/2024   Forgetfulness 02/12/2024   MDD (major depressive disorder) 10/28/2023   Encounter for screening mammogram for malignant neoplasm of breast 08/24/2023   Closed fracture of ulna, shaft, right 05/26/2023   Displaced fracture of proximal phalanx of left little finger, initial encounter for closed fracture 05/26/2023   Grief reaction 02/06/2023   Apical lung scarring 11/26/2022   Pure hypercholesterolemia 11/26/2022   Osteoarthritis of hands, bilateral 08/13/2022   BPV (benign positional vertigo) 04/30/2022   Varicose veins of both lower extremities with pain 01/17/2022   Atherosclerosis of aorta 01/17/2022   Skin lesion of back 06/28/2021   Internal and external hemorrhoids without complication 08/10/2020   Diarrhea 08/10/2020   Recurrent major depressive disorder, in partial remission 08/10/2020   Tinnitus of both ears 09/30/2018   Osteoporosis 01/26/2018   Postmenopausal 12/31/2016   Mnire's disease 03/07/2016   Chiari I malformation (HCC) 03/07/2016   Healthcare maintenance 04/14/2013   SVT (supraventricular tachycardia) 03/15/2013     Past Surgical History:  Procedure Laterality Date   APPENDECTOMY     COLONOSCOPY     ELECTROPHYSIOLOGIC STUDY N/A 10/12/2015   Procedure: Atrial Fibrillation Ablation;  Surgeon: Will Gladis Norton, MD;  [facility]: [facility];  Service: Cardiovascular;  Laterality: N/A;   LID LESION EXCISION Left 08/06/2017   Procedure: EXCISION BIOPSY OF EYELID, RECONSTRUCTION OF EYELID;  Surgeon: Laurie Loyd Redhead, MD;  Location: [facility] OR;  Service: Ophthalmology;  Laterality: Left;   OVARIAN CYST REMOVAL  09/1967   left ovarian cystectomy- 17.5 lb cyst removed- per pt   right mastoidectomy     ET tube placement in 1980 and 1990   right sinus surgery  october 2000   SVT ABLATION N/A 07/25/2020   Procedure: SVT ABLATION;  Surgeon: Inocencio Soyla Lunger, MD;  Location: [facility];  Service: Cardiovascular;  Laterality: N/A;   SVT ABLATION  N/A 10/26/2020   Procedure: SVT ABLATION;  Surgeon: Inocencio Soyla Lunger, MD;  Location: [facility];  Service: Cardiovascular;  Laterality: N/A;   THYROIDECTOMY, PARTIAL     nodule removal - April 1996    ROS negative unless otherwise indicated in the HPI or Assessment and Plan.  Current Outpatient Medications  Medication Instructions   albuterol  (VENTOLIN  HFA) 108 (90 Base) MCG/ACT inhaler 2 puffs, Inhalation, Every 6 hours PRN   alendronate  (FOSAMAX ) 70 mg, Oral, Weekly, Take with a full glass of water  on an empty stomach.   apixaban  (ELIQUIS ) 5 mg, Oral, 2 times daily   fluticasone  (FLONASE ) 50 MCG/ACT nasal spray Use 2 spray(s) in each nostril once daily   HYDROcodone -acetaminophen  (NORCO/VICODIN) 5-325 MG tablet 1 tablet, Oral, Every 6 hours PRN   meclizine  (ANTIVERT ) 25 mg, Oral, 3 times daily PRN   propranolol  (INDERAL ) 20 mg, Oral, 2 times daily   QUEtiapine  (SEROQUEL ) 100 mg, Daily at bedtime   rosuvastatin  (CRESTOR ) 40 mg, Oral, Daily   traZODone  (DESYREL ) 100 mg, Daily at bedtime   umeclidinium bromide  (INCRUSE ELLIPTA ) 62.5 MCG/ACT AEPB 1 puff, Inhalation, Daily   venlafaxine  (EFFEXOR ) 75 MG tablet Take by mouth.   venlafaxine  XR (EFFEXOR -XR) 150 mg, Daily    Social History   Tobacco Use   Smoking status: Never    Passive exposure: Current   Smokeless tobacco: Never  Vaping Use   Vaping status: Never Used  Substance Use Topics   Alcohol use: No    Alcohol/week: 0.0 standard drinks of alcohol   Drug use: No     Objective  Today's Vitals   06/01/24 1518  BP: 127/61  Pulse: 81  Temp: 98.1 F (36.7 C)  TempSrc: Oral  SpO2: 95%  Weight: 155 lb (70.3 kg)  Height: 5' 5.5 (1.664 m)  Body mass index is 25.4 kg/m.   Physical Exam: Constitutional: well-appearing, well-nourished; normal weight; no acute distress HENT: normocephalic atraumatic, mucous membranes moist Eyes: conjunctiva non-erythematous Cardiovascular: regular rate and rhythm, no  m/r/g Pulmonary/Chest: normal work of breathing on room air, lungs CTAB Abdominal: soft, non-tender, non-distended MSK: normal bulk and tone Neurological: alert & oriented x 3, no focal deficit; 5/5 strength in BUE + BLE, no sensory deficits Skin: warm and dry Extremities: BLE without edema or erythema. Psych: normal mood and behavior     Assessment & Plan  Meniere's disease of both ears Assessment & Plan: Patient never followed up with the ENT after referral was placed in office visit and February 2025.  She has history of Mnire's disease with multiple procedures related to her ear as well as sinus.  Patient's acutely worsening symptoms may be in the setting of viral URI, however further evaluation by ENT specialist is warranted given her history. - Referral placed for ENT  Orders: -     PT Vestibular Evaluation; Future -     Ambulatory referral to ENT  Benign paroxysmal positional vertigo, unspecified laterality Assessment & Plan: History of Mnire's disease with BPPV as well.  Her symptoms have  been ongoing for several months/years now and are acutely worse in the past 2 weeks.  At office visit in February 2025, she was referred for vestibular rehabilitation but did not complete this.  Also does not recall any handout with the Epley maneuver.  Meclizine  has not improved her symptoms.  Will re-refer to vestibular rehab. - Vestibular rehab referral placed  Orders: -     PT Vestibular Evaluation; Future -     Ambulatory referral to ENT  Cerebrovascular accident (CVA), unspecified mechanism (HCC) -     SLP modified barium swallow; Future  Dysphagia, unspecified type Assessment & Plan:     Dysphagia noted at office visit in 02/12/2024.  This was new for the past month and her symptoms have not been improved.  Sensation of food getting stuck in the back of her throat.  This was done for barium swallow study but missed her appointment due to ongoing social factors/medical issues.   The patient's husband passed away a few months ago and she recently was hospitalized for CVA.  Will need to place new order for swallow study. - Modified barium swallow study ordered  Orders: -     SLP modified barium swallow; Future  Tinnitus of both ears Assessment & Plan: Audiology referral was placed on office visit 03/02/2024, however the patient never got this completed.  Will re-refer. - Audiology referral placed  Orders: -     Ambulatory referral to Audiology  Pure hypercholesterolemia Assessment & Plan: At last neurology appointment discussed reducing dose of Crestor .  Given patient's CVA, will now need secondary prevention for ASCVD.  Last LDL was 68 in August 2025.  Currently taking Crestor  40 mg daily.  The patient is not having any side effects from this medication.  Will continue same dose and continue to reassess lipids to make sure the patient is at goal. - Continue Crestor  40 mg daily    Return in about 3 months (around 09/01/2024) for Hx of CVA/HLD/discuss dental surgery.  Patient case discussed with Dr. Shawn who also saw and evaluated the patient.  Esmay Amspacher, MD [facility]  PGY-1 06/01/2024, 5:56 PM

## 2024-06-01 ENCOUNTER — Ambulatory Visit

## 2024-06-01 ENCOUNTER — Other Ambulatory Visit: Payer: Self-pay

## 2024-06-01 VITALS — BP 127/61 | HR 81 | Temp 98.1°F | Ht 65.5 in | Wt 155.0 lb

## 2024-06-01 DIAGNOSIS — H9313 Tinnitus, bilateral: Secondary | ICD-10-CM

## 2024-06-01 DIAGNOSIS — H8103 Meniere's disease, bilateral: Secondary | ICD-10-CM

## 2024-06-01 DIAGNOSIS — E7800 Pure hypercholesterolemia, unspecified: Secondary | ICD-10-CM

## 2024-06-01 DIAGNOSIS — H8110 Benign paroxysmal vertigo, unspecified ear: Secondary | ICD-10-CM

## 2024-06-01 DIAGNOSIS — R1310 Dysphagia, unspecified: Secondary | ICD-10-CM

## 2024-06-01 DIAGNOSIS — I639 Cerebral infarction, unspecified: Secondary | ICD-10-CM | POA: Diagnosis not present

## 2024-06-01 NOTE — Assessment & Plan Note (Signed)
 At last neurology appointment discussed reducing dose of Crestor .  Given patient's CVA, will now need secondary prevention for ASCVD.  Last LDL was 68 in August 2025.  Currently taking Crestor  40 mg daily.  The patient is not having any side effects from this medication.  Will continue same dose and continue to reassess lipids to make sure the patient is at goal. - Continue Crestor  40 mg daily

## 2024-06-01 NOTE — Patient Instructions (Addendum)
 Thank you, [name] Orange for allowing us  to provide your care today. Today we discussed the following:  - For your Mnire disease and BPPV, we have sent another referral to the ENT doctor.  We have also sent you a referral for vestibular rehabilitation which should help improve your symptoms. - For your difficulty swallowing, we have reordered your modified barium swallow study test.  Please be on the look out for a phone call in the next few weeks to schedule this. - To release your medical records, we can give you the forms to fill out for release of medical information.  Unfortunately we are unable to provide you with records at today's visit.  I have ordered the following labs for you:  Lab Orders  No laboratory test(s) ordered today     Tests ordered today:  Vestibular rehabilitation Modified Barium Swallow  Referrals ordered today:   Referral Orders         Ambulatory referral to ENT         Ambulatory referral to Audiology      I have ordered the following medication/changed the following medications:   Stop the following medications: There are no discontinued medications.   Start the following medications: No orders of the defined types were placed in this encounter.    Follow up: 3 MONTHS    Remember: Please call our clinic or go to the emergency department if you experience worsening of one-sided weakness, facial droop, sensory deficits, extreme headache.  These are signs of a potential stroke.  Should you have any questions or concerns please call the Internal Medicine Clinic at [phone].     Vicki Chaffin, MD [facility]

## 2024-06-01 NOTE — Assessment & Plan Note (Signed)
 Dysphagia noted at office visit in 02/12/2024.  This was new for the past month and her symptoms have not been improved.  Sensation of food getting stuck in the back of her throat.  This was done for barium swallow study but missed her appointment due to ongoing social factors/medical issues.  The patient's husband passed away a few months ago and she recently was hospitalized for CVA.  Will need to place new order for swallow study. - Modified barium swallow study ordered

## 2024-06-01 NOTE — Assessment & Plan Note (Signed)
 History of Mnire's disease with BPPV as well.  Her symptoms have been ongoing for several months/years now and are acutely worse in the past 2 weeks.  At office visit in February 2025, she was referred for vestibular rehabilitation but did not complete this.  Also does not recall any handout with the Epley maneuver.  Meclizine  has not improved her symptoms.  Will re-refer to vestibular rehab. - Vestibular rehab referral placed

## 2024-06-01 NOTE — Assessment & Plan Note (Signed)
 Audiology referral was placed on office visit 03/02/2024, however the patient never got this completed.  Will re-refer. - Audiology referral placed

## 2024-06-01 NOTE — Assessment & Plan Note (Signed)
 Patient never followed up with the ENT after referral was placed in office visit and February 2025.  She has history of Mnire's disease with multiple procedures related to her ear as well as sinus.  Patient's acutely worsening symptoms may be in the setting of viral URI, however further evaluation by ENT specialist is warranted given her history. - Referral placed for ENT

## 2024-06-02 ENCOUNTER — Telehealth (HOSPITAL_COMMUNITY): Payer: Self-pay | Admitting: *Deleted

## 2024-06-02 NOTE — Telephone Encounter (Signed)
 Attempted to contact patient to schedule OP MBS. Left VM @ [phone]. RKEEL

## 2024-06-02 NOTE — Progress Notes (Signed)
 Internal Medicine Clinic Attending  I was physically present during the key portions of the resident provided service and participated in the medical decision making of patient's management care. I reviewed pertinent patient test results.  The assessment, diagnosis, and plan were formulated together and I agree with the documentation in the resident's note.  Shawn Sick, MD

## 2024-06-09 ENCOUNTER — Telehealth (HOSPITAL_COMMUNITY): Payer: Self-pay

## 2024-06-09 NOTE — Telephone Encounter (Signed)
 Attempted to contact patient at [phone] to schedule swallow test. Left Voicemail and call back request. (AHARRIS)

## 2024-06-15 ENCOUNTER — Telehealth: Payer: Self-pay | Admitting: Orthopedic Surgery

## 2024-06-15 NOTE — Telephone Encounter (Signed)
Patient wants

## 2024-06-17 ENCOUNTER — Other Ambulatory Visit (HOSPITAL_COMMUNITY): Payer: Self-pay

## 2024-06-17 DIAGNOSIS — R1310 Dysphagia, unspecified: Secondary | ICD-10-CM

## 2024-06-17 DIAGNOSIS — R059 Cough, unspecified: Secondary | ICD-10-CM

## 2024-06-23 ENCOUNTER — Ambulatory Visit: Admitting: Student

## 2024-06-28 ENCOUNTER — Ambulatory Visit: Admitting: Student

## 2024-06-30 ENCOUNTER — Ambulatory Visit: Admitting: Student

## 2024-07-07 ENCOUNTER — Ambulatory Visit: Payer: Self-pay

## 2024-07-08 ENCOUNTER — Encounter (HOSPITAL_COMMUNITY)

## 2024-07-08 ENCOUNTER — Ambulatory Visit (HOSPITAL_COMMUNITY)
Admission: RE | Admit: 2024-07-08 | Discharge: 2024-07-08 | Disposition: A | Discharge status: Home / Self Care | Source: Ambulatory Visit | Attending: *Deleted | Admitting: *Deleted

## 2024-07-08 DIAGNOSIS — I639 Cerebral infarction, unspecified: Secondary | ICD-10-CM

## 2024-07-08 DIAGNOSIS — R1310 Dysphagia, unspecified: Secondary | ICD-10-CM

## 2024-07-12 ENCOUNTER — Ambulatory Visit: Admitting: Student

## 2024-07-16 ENCOUNTER — Other Ambulatory Visit: Payer: Self-pay

## 2024-07-16 DIAGNOSIS — I639 Cerebral infarction, unspecified: Secondary | ICD-10-CM

## 2024-07-18 NOTE — Telephone Encounter (Signed)
 Medication sent to pharmacy

## 2024-07-19 ENCOUNTER — Ambulatory Visit: Admitting: Student

## 2024-07-19 NOTE — Progress Notes (Deleted)
*** ° ° ° °  CC: {Clinic Visit Type:31040}  HPI:  Sheri [name] is a 73 y.o. female with pertinent PMH of CVA (02/15/24 right precentral gyrus), Afib s/p ablation 09/2015 now on Eliquis , SVT, Meniere's disease, MDD, OA, HLD who presents as above. Please see assessment and plan below for further details.  Medications: Current Outpatient Medications  Medication Instructions   albuterol  (VENTOLIN  HFA) 108 (90 Base) MCG/ACT inhaler 2 puffs, Inhalation, Every 6 hours PRN   alendronate  (FOSAMAX ) 70 mg, Oral, Weekly, Take with a full glass of water  on an empty stomach.   Eliquis  5 mg, Oral, 2 times daily   fluticasone  (FLONASE ) 50 MCG/ACT nasal spray Use 2 spray(s) in each nostril once daily   HYDROcodone -acetaminophen  (NORCO/VICODIN) 5-325 MG tablet 1 tablet, Oral, Every 6 hours PRN   meclizine  (ANTIVERT ) 25 mg, Oral, 3 times daily PRN   propranolol  (INDERAL ) 20 mg, Oral, 2 times daily   QUEtiapine  (SEROQUEL ) 100 mg, Daily at bedtime   rosuvastatin  (CRESTOR ) 40 mg, Oral, Daily   traZODone  (DESYREL ) 100 mg, Daily at bedtime   umeclidinium bromide  (INCRUSE ELLIPTA ) 62.5 MCG/ACT AEPB 1 puff, Inhalation, Daily   venlafaxine  (EFFEXOR ) 75 MG tablet Take by mouth.   venlafaxine  XR (EFFEXOR -XR) 150 mg, Daily     Review of Systems:   Pertinent items noted in HPI and/or A&P.  Physical Exam:  There were no vitals filed for this visit.  Constitutional:***. In no acute distress. HEENT: Normocephalic, atraumatic, Sclera non-icteric, PERRL, EOM intact Cardio:Regular rate and rhythm. 2+ bilateral {PulseLoc:28294} pulses. Pulm:Clear to auscultation bilaterally. Normal work of breathing on room air. Abdomen: Soft, non-tender, non-distended, positive bowel sounds. FDX:Wzhjupcz for extremity edema. Skin:Warm and dry. Neuro:Alert and oriented x3. No focal deficit noted. Psych:Pleasant mood and affect.   Assessment & Plan:   Assessment & Plan   No orders of the defined types were placed in this  encounter.    No follow-ups on file.   Patient {GC/GE:[id number]::discussed with,seen with} {JGIMTSattending2025/2026:32954}  Fairy Pool, DO [facility] Internal Medicine Resident PGY-3 Clinic Phone: [phone] Please contact the on call pager at [phone] for any urgent or emergent needs.

## 2024-07-25 ENCOUNTER — Ambulatory Visit: Admitting: Student

## 2024-07-25 NOTE — Progress Notes (Deleted)
*** ° ° ° °  CC: {Clinic Visit Type:31040}  HPI:  Sheri [name] is a 74 y.o. female with pertinent PMH of CVA (02/15/24 right precentral gyrus), Afib s/p ablation 09/2015 now on Eliquis , SVT, Meniere's disease, MDD, OA, HLD who presents as above. Please see assessment and plan below for further details.  Medications: Current Outpatient Medications  Medication Instructions   albuterol  (VENTOLIN  HFA) 108 (90 Base) MCG/ACT inhaler 2 puffs, Inhalation, Every 6 hours PRN   alendronate  (FOSAMAX ) 70 mg, Oral, Weekly, Take with a full glass of water  on an empty stomach.   Eliquis  5 mg, Oral, 2 times daily   fluticasone  (FLONASE ) 50 MCG/ACT nasal spray Use 2 spray(s) in each nostril once daily   HYDROcodone -acetaminophen  (NORCO/VICODIN) 5-325 MG tablet 1 tablet, Oral, Every 6 hours PRN   meclizine  (ANTIVERT ) 25 mg, Oral, 3 times daily PRN   propranolol  (INDERAL ) 20 mg, Oral, 2 times daily   QUEtiapine  (SEROQUEL ) 100 mg, Daily at bedtime   rosuvastatin  (CRESTOR ) 40 mg, Oral, Daily   traZODone  (DESYREL ) 100 mg, Daily at bedtime   umeclidinium bromide  (INCRUSE ELLIPTA ) 62.5 MCG/ACT AEPB 1 puff, Inhalation, Daily   venlafaxine  (EFFEXOR ) 75 MG tablet Take by mouth.   venlafaxine  XR (EFFEXOR -XR) 150 mg, Daily     Review of Systems:   Pertinent items noted in HPI and/or A&P.  Physical Exam:  There were no vitals filed for this visit.  Constitutional:***. In no acute distress. HEENT: Normocephalic, atraumatic, Sclera non-icteric, PERRL, EOM intact Cardio:Regular rate and rhythm. 2+ bilateral {PulseLoc:28294} pulses. Pulm:Clear to auscultation bilaterally. Normal work of breathing on room air. Abdomen: Soft, non-tender, non-distended, positive bowel sounds. FDX:Wzhjupcz for extremity edema. Skin:Warm and dry. Neuro:Alert and oriented x3. No focal deficit noted. Psych:Pleasant mood and affect.   Assessment & Plan:   Assessment & Plan   No orders of the defined types were placed in this  encounter.    No follow-ups on file.   Patient {GC/GE:[id number]::discussed with,seen with} {JGIMTSattending2025/2026:32954}  Fairy Pool, DO [facility] Internal Medicine Resident PGY-3 Clinic Phone: [phone] Please contact the on call pager at [phone] for any urgent or emergent needs.

## 2024-08-01 ENCOUNTER — Institutional Professional Consult (permissible substitution) (INDEPENDENT_AMBULATORY_CARE_PROVIDER_SITE_OTHER)

## 2024-08-01 ENCOUNTER — Ambulatory Visit: Admitting: Student

## 2024-08-01 NOTE — Progress Notes (Unsigned)
" ° °  Established Patient Office Visit  Subjective   Patient ID: Sheri [name], female    DOB: 1950-08-26  Age: 74 y.o. MRN: [id number]  No chief complaint on file.   Sheri [name] is a 74 y.o. who presents to the clinic for ***. Please see problem based assessment and plan for additional details.   Dental surgery clearance RCRI: 1 On eliquis  for both afib s/p ablation and CVA in July  @risk  for MRONJ? On bisphosphonates   Patient Active Problem List   Diagnosis Date Noted   Dysphagia 02/12/2024   Forgetfulness 02/12/2024   MDD (major depressive disorder) 10/28/2023   Encounter for screening mammogram for malignant neoplasm of breast 08/24/2023   Closed fracture of ulna, shaft, right 05/26/2023   Displaced fracture of proximal phalanx of left little finger, initial encounter for closed fracture 05/26/2023   Grief reaction 02/06/2023   Apical lung scarring 11/26/2022   Pure hypercholesterolemia 11/26/2022   Osteoarthritis of hands, bilateral 08/13/2022   BPV (benign positional vertigo) 04/30/2022   Varicose veins of both lower extremities with pain 01/17/2022   Atherosclerosis of aorta 01/17/2022   Skin lesion of back 06/28/2021   Internal and external hemorrhoids without complication 08/10/2020   Diarrhea 08/10/2020   Recurrent major depressive disorder, in partial remission 08/10/2020   Tinnitus of both ears 09/30/2018   Osteoporosis 01/26/2018   Postmenopausal 12/31/2016   Mnire's disease 03/07/2016   Chiari I malformation (HCC) 03/07/2016   Healthcare maintenance 04/14/2013   SVT (supraventricular tachycardia) 03/15/2013      Objective:     There were no vitals taken for this visit. BP Readings from Last 3 Encounters:  06/01/24 127/61  03/30/24 128/70  03/01/24 129/61   Wt Readings from Last 3 Encounters:  06/01/24 155 lb (70.3 kg)  03/30/24 155 lb (70.3 kg)  03/01/24 155 lb (70.3 kg)      Physical Exam   No results found for any visits on  08/01/24.  Last metabolic panel Lab Results  Component Value Date   GLUCOSE 113 (H) 02/15/2024   NA 142 02/15/2024   K 3.4 (L) 02/15/2024   CL 108 02/15/2024   CO2 24 02/15/2024   BUN 9 02/15/2024   CREATININE 0.77 02/15/2024   GFRNONAA >60 02/15/2024   CALCIUM  8.9 02/15/2024   PROT 6.5 02/15/2024   ALBUMIN 3.4 (L) 02/15/2024   LABGLOB 2.5 10/28/2023   AGRATIO 1.6 01/26/2018   BILITOT 0.7 02/15/2024   ALKPHOS 54 02/15/2024   AST 17 02/15/2024   ALT 15 02/15/2024   ANIONGAP 10 02/15/2024   Last lipids Lab Results  Component Value Date   CHOL 143 03/01/2024   HDL 59 03/01/2024   LDLCALC 68 03/01/2024   TRIG 85 03/01/2024   CHOLHDL 2.4 03/01/2024   Last hemoglobin A1c Lab Results  Component Value Date   HGBA1C 5.2 03/01/2024      The ASCVD Risk score (Arnett DK, et al., 2019) failed to calculate for the following reasons:   Risk score cannot be calculated because patient has a medical history suggesting prior/existing ASCVD   * - Cholesterol units were assumed    Assessment & Plan:   Problem List Items Addressed This Visit   None   No follow-ups on file.    Damien Lease, DO  "

## 2024-08-08 ENCOUNTER — Ambulatory Visit: Payer: Self-pay | Admitting: Student

## 2024-08-08 NOTE — Progress Notes (Unsigned)
" ° °  Established Patient Office Visit  Subjective   Patient ID: SHALEENA CRUSOE, female    DOB: 18-Nov-1950  Age: 74 y.o. MRN: [id number]  No chief complaint on file.   LATRENDA IRANI is a 74 y.o. who presents to the clinic for ***. Please see problem based assessment and plan for additional details.   Patient does not have high risk features for osteonecrosis of the jaw - she is not on steroids, not on active cancer treatment, does not have T2DM, and does not use tobacco. According to the International Task Force on Osteonecrosis of the Jaw, she should continue her bisphosphonate throughout surgery.  Hold eliquis : day prior and resume day after   Patient Active Problem List   Diagnosis Date Noted   Dysphagia 02/12/2024   Forgetfulness 02/12/2024   MDD (major depressive disorder) 10/28/2023   Encounter for screening mammogram for malignant neoplasm of breast 08/24/2023   Closed fracture of ulna, shaft, right 05/26/2023   Displaced fracture of proximal phalanx of left little finger, initial encounter for closed fracture 05/26/2023   Grief reaction 02/06/2023   Apical lung scarring 11/26/2022   Pure hypercholesterolemia 11/26/2022   Osteoarthritis of hands, bilateral 08/13/2022   BPV (benign positional vertigo) 04/30/2022   Varicose veins of both lower extremities with pain 01/17/2022   Atherosclerosis of aorta 01/17/2022   Skin lesion of back 06/28/2021   Internal and external hemorrhoids without complication 08/10/2020   Diarrhea 08/10/2020   Recurrent major depressive disorder, in partial remission 08/10/2020   Tinnitus of both ears 09/30/2018   Osteoporosis 01/26/2018   Postmenopausal 12/31/2016   Mnire's disease 03/07/2016   Chiari I malformation (HCC) 03/07/2016   Healthcare maintenance 04/14/2013   SVT (supraventricular tachycardia) 03/15/2013      ROS    Objective:     There were no vitals taken for this visit. BP Readings from Last 3 Encounters:  06/01/24 127/61   03/30/24 128/70  03/01/24 129/61   Wt Readings from Last 3 Encounters:  06/01/24 155 lb (70.3 kg)  03/30/24 155 lb (70.3 kg)  03/01/24 155 lb (70.3 kg)      Physical Exam   No results found for any visits on 08/08/24.    The ASCVD Risk score (Arnett DK, et al., 2019) failed to calculate for the following reasons:   Risk score cannot be calculated because patient has a medical history suggesting prior/existing ASCVD   * - Cholesterol units were assumed    Assessment & Plan:   Problem List Items Addressed This Visit   None   No follow-ups on file.    Damien Lease, DO  "

## 2024-08-10 ENCOUNTER — Encounter (HOSPITAL_COMMUNITY)

## 2024-08-12 ENCOUNTER — Telehealth (HOSPITAL_COMMUNITY): Payer: Self-pay

## 2024-08-12 NOTE — Telephone Encounter (Signed)
 OP MBS order canceled after 3 reschedules and 2 no shows since August 2025. Patient will need to call Dr. SHAUNNA. Chun's office for new swallow test order if she wants to proceed.

## 2024-08-15 ENCOUNTER — Ambulatory Visit: Admitting: Student

## 2024-08-16 ENCOUNTER — Telehealth: Payer: Self-pay | Admitting: *Deleted

## 2024-08-16 NOTE — Telephone Encounter (Signed)
 Mammogram appointment February 27,2026 @ 11:30 am to arrive 11:10 am / attached to the appointment is the information for the 75.00 no show fee. Appointment mailed to the patient.

## 2024-08-18 ENCOUNTER — Ambulatory Visit: Payer: Self-pay

## 2024-08-23 ENCOUNTER — Ambulatory Visit: Admitting: Student

## 2024-08-26 ENCOUNTER — Ambulatory Visit: Admitting: Student

## 2024-08-30 ENCOUNTER — Ambulatory Visit: Admitting: Student

## 2024-09-05 ENCOUNTER — Institutional Professional Consult (permissible substitution) (INDEPENDENT_AMBULATORY_CARE_PROVIDER_SITE_OTHER)

## 2024-09-16 ENCOUNTER — Ambulatory Visit

## 2024-09-21 ENCOUNTER — Ambulatory Visit: Payer: Self-pay
